# Patient Record
Sex: Female | Born: 1965
Health system: Southern US, Community
[De-identification: ages and names within clinical notes are randomized; demographics above are authoritative.]

## PROBLEM LIST (undated history)

## (undated) DIAGNOSIS — K219 Gastro-esophageal reflux disease without esophagitis: Secondary | ICD-10-CM

## (undated) DIAGNOSIS — Z9889 Other specified postprocedural states: Secondary | ICD-10-CM

## (undated) DIAGNOSIS — M797 Fibromyalgia: Secondary | ICD-10-CM

## (undated) DIAGNOSIS — F329 Major depressive disorder, single episode, unspecified: Secondary | ICD-10-CM

## (undated) DIAGNOSIS — G459 Transient cerebral ischemic attack, unspecified: Secondary | ICD-10-CM

## (undated) DIAGNOSIS — R569 Unspecified convulsions: Secondary | ICD-10-CM

## (undated) DIAGNOSIS — R112 Nausea with vomiting, unspecified: Secondary | ICD-10-CM

## (undated) DIAGNOSIS — F32A Depression, unspecified: Secondary | ICD-10-CM

## (undated) HISTORY — PX: OVARIAN CYST SURGERY: SHX726

## (undated) HISTORY — DX: Fibromyalgia: M79.7

## (undated) HISTORY — DX: Unspecified convulsions: R56.9

## (undated) HISTORY — PX: COSMETIC SURGERY: SHX468

## (undated) HISTORY — PX: TUBAL LIGATION: SHX77

## (undated) HISTORY — PX: ABDOMINAL HYSTERECTOMY: SHX81

## (undated) HISTORY — DX: Major depressive disorder, single episode, unspecified: F32.9

## (undated) HISTORY — DX: Depression, unspecified: F32.A

## (undated) HISTORY — DX: Gastro-esophageal reflux disease without esophagitis: K21.9

## (undated) HISTORY — PX: AUGMENTATION MAMMAPLASTY: SUR837

## (undated) HISTORY — PX: CHOLECYSTECTOMY: SHX55

## (undated) HISTORY — PX: TOTAL VAGINAL HYSTERECTOMY: SHX2548

---

## 1998-11-22 ENCOUNTER — Encounter: Admission: RE | Admit: 1998-11-22 | Discharge: 1998-11-22 | Payer: Self-pay | Admitting: Obstetrics

## 1999-02-14 ENCOUNTER — Encounter: Admission: RE | Admit: 1999-02-14 | Discharge: 1999-02-14 | Payer: Self-pay | Admitting: Obstetrics

## 2000-01-03 ENCOUNTER — Encounter: Admission: RE | Admit: 2000-01-03 | Discharge: 2000-01-03 | Payer: Self-pay | Admitting: Obstetrics

## 2000-06-02 ENCOUNTER — Encounter (INDEPENDENT_AMBULATORY_CARE_PROVIDER_SITE_OTHER): Payer: Self-pay

## 2000-06-02 ENCOUNTER — Ambulatory Visit (HOSPITAL_COMMUNITY): Admission: RE | Admit: 2000-06-02 | Discharge: 2000-06-02 | Payer: Self-pay | Admitting: Urology

## 2001-10-09 ENCOUNTER — Emergency Department (HOSPITAL_COMMUNITY): Admission: EM | Admit: 2001-10-09 | Discharge: 2001-10-09 | Payer: Self-pay | Admitting: *Deleted

## 2002-04-18 ENCOUNTER — Encounter (INDEPENDENT_AMBULATORY_CARE_PROVIDER_SITE_OTHER): Payer: Self-pay

## 2002-04-18 ENCOUNTER — Ambulatory Visit (HOSPITAL_COMMUNITY): Admission: RE | Admit: 2002-04-18 | Discharge: 2002-04-18 | Payer: Self-pay | Admitting: Gastroenterology

## 2003-05-15 ENCOUNTER — Ambulatory Visit (HOSPITAL_COMMUNITY): Admission: RE | Admit: 2003-05-15 | Discharge: 2003-05-15 | Payer: Self-pay | Admitting: Obstetrics

## 2003-05-15 ENCOUNTER — Encounter: Payer: Self-pay | Admitting: Obstetrics

## 2003-05-22 ENCOUNTER — Encounter: Admission: RE | Admit: 2003-05-22 | Discharge: 2003-05-22 | Payer: Self-pay | Admitting: Obstetrics

## 2003-05-22 ENCOUNTER — Encounter: Payer: Self-pay | Admitting: Obstetrics

## 2003-05-22 ENCOUNTER — Encounter (INDEPENDENT_AMBULATORY_CARE_PROVIDER_SITE_OTHER): Payer: Self-pay | Admitting: *Deleted

## 2003-05-26 ENCOUNTER — Encounter (HOSPITAL_BASED_OUTPATIENT_CLINIC_OR_DEPARTMENT_OTHER): Payer: Self-pay | Admitting: General Surgery

## 2003-05-31 ENCOUNTER — Encounter (HOSPITAL_BASED_OUTPATIENT_CLINIC_OR_DEPARTMENT_OTHER): Payer: Self-pay | Admitting: General Surgery

## 2003-05-31 ENCOUNTER — Encounter (INDEPENDENT_AMBULATORY_CARE_PROVIDER_SITE_OTHER): Payer: Self-pay | Admitting: Specialist

## 2003-05-31 ENCOUNTER — Ambulatory Visit (HOSPITAL_COMMUNITY): Admission: RE | Admit: 2003-05-31 | Discharge: 2003-05-31 | Payer: Self-pay | Admitting: General Surgery

## 2003-05-31 ENCOUNTER — Encounter: Admission: RE | Admit: 2003-05-31 | Discharge: 2003-05-31 | Payer: Self-pay | Admitting: General Surgery

## 2003-12-12 ENCOUNTER — Ambulatory Visit (HOSPITAL_COMMUNITY): Admission: RE | Admit: 2003-12-12 | Discharge: 2003-12-12 | Payer: Self-pay | Admitting: Obstetrics

## 2003-12-13 ENCOUNTER — Observation Stay (HOSPITAL_COMMUNITY): Admission: RE | Admit: 2003-12-13 | Discharge: 2003-12-14 | Payer: Self-pay | Admitting: Obstetrics

## 2003-12-13 ENCOUNTER — Encounter (INDEPENDENT_AMBULATORY_CARE_PROVIDER_SITE_OTHER): Payer: Self-pay | Admitting: *Deleted

## 2004-03-12 ENCOUNTER — Inpatient Hospital Stay (HOSPITAL_COMMUNITY): Admission: AD | Admit: 2004-03-12 | Discharge: 2004-03-12 | Payer: Self-pay | Admitting: Obstetrics

## 2004-06-03 ENCOUNTER — Ambulatory Visit (HOSPITAL_COMMUNITY): Admission: RE | Admit: 2004-06-03 | Discharge: 2004-06-03 | Payer: Self-pay | Admitting: Obstetrics

## 2005-08-20 ENCOUNTER — Encounter: Admission: RE | Admit: 2005-08-20 | Discharge: 2005-08-20 | Payer: Self-pay | Admitting: Obstetrics

## 2005-10-30 ENCOUNTER — Ambulatory Visit: Payer: Self-pay | Admitting: Internal Medicine

## 2006-02-04 ENCOUNTER — Ambulatory Visit: Payer: Self-pay | Admitting: Family Medicine

## 2006-04-02 ENCOUNTER — Ambulatory Visit: Payer: Self-pay | Admitting: Internal Medicine

## 2006-04-08 ENCOUNTER — Ambulatory Visit: Payer: Self-pay | Admitting: Family Medicine

## 2006-04-09 ENCOUNTER — Encounter: Admission: RE | Admit: 2006-04-09 | Discharge: 2006-04-09 | Payer: Self-pay | Admitting: Family Medicine

## 2006-10-23 ENCOUNTER — Ambulatory Visit: Payer: Self-pay | Admitting: Internal Medicine

## 2007-11-16 ENCOUNTER — Telehealth (INDEPENDENT_AMBULATORY_CARE_PROVIDER_SITE_OTHER): Payer: Self-pay | Admitting: *Deleted

## 2007-12-02 ENCOUNTER — Telehealth (INDEPENDENT_AMBULATORY_CARE_PROVIDER_SITE_OTHER): Payer: Self-pay | Admitting: *Deleted

## 2007-12-31 ENCOUNTER — Encounter: Admission: RE | Admit: 2007-12-31 | Discharge: 2007-12-31 | Payer: Self-pay | Admitting: Obstetrics

## 2009-05-02 ENCOUNTER — Ambulatory Visit: Payer: Self-pay | Admitting: Internal Medicine

## 2009-05-02 DIAGNOSIS — J019 Acute sinusitis, unspecified: Secondary | ICD-10-CM | POA: Insufficient documentation

## 2009-05-23 ENCOUNTER — Ambulatory Visit: Payer: Self-pay | Admitting: Family Medicine

## 2009-06-01 ENCOUNTER — Telehealth (INDEPENDENT_AMBULATORY_CARE_PROVIDER_SITE_OTHER): Payer: Self-pay | Admitting: *Deleted

## 2009-06-05 ENCOUNTER — Telehealth (INDEPENDENT_AMBULATORY_CARE_PROVIDER_SITE_OTHER): Payer: Self-pay | Admitting: *Deleted

## 2009-06-15 ENCOUNTER — Ambulatory Visit (HOSPITAL_COMMUNITY): Admission: RE | Admit: 2009-06-15 | Discharge: 2009-06-15 | Payer: Self-pay | Admitting: Obstetrics

## 2009-08-13 LAB — HM MAMMOGRAPHY: HM Mammogram: NORMAL

## 2009-09-07 ENCOUNTER — Encounter: Admission: RE | Admit: 2009-09-07 | Discharge: 2009-09-07 | Payer: Self-pay | Admitting: Obstetrics

## 2009-10-19 ENCOUNTER — Ambulatory Visit: Payer: Self-pay | Admitting: Family

## 2009-10-19 DIAGNOSIS — M2550 Pain in unspecified joint: Secondary | ICD-10-CM | POA: Insufficient documentation

## 2009-10-22 ENCOUNTER — Telehealth: Payer: Self-pay | Admitting: Family

## 2009-10-25 LAB — CONVERTED CEMR LAB
ALT: 13 units/L (ref 0–35)
AST: 13 units/L (ref 0–37)
Albumin: 4.5 g/dL (ref 3.5–5.2)
Alkaline Phosphatase: 46 units/L (ref 39–117)
Anti Nuclear Antibody(ANA): NEGATIVE
BUN: 21 mg/dL (ref 6–23)
Basophils Absolute: 0 10*3/uL (ref 0.0–0.1)
Basophils Relative: 1 % (ref 0–1)
CO2: 19 meq/L (ref 19–32)
Calcium: 8.9 mg/dL (ref 8.4–10.5)
Chloride: 106 meq/L (ref 96–112)
Creatinine, Ser: 0.7 mg/dL (ref 0.40–1.20)
Eosinophils Absolute: 0.1 10*3/uL (ref 0.0–0.7)
Eosinophils Relative: 1 % (ref 0–5)
Glucose, Bld: 97 mg/dL (ref 70–99)
HCT: 35.6 % — ABNORMAL LOW (ref 36.0–46.0)
Hemoglobin: 12.3 g/dL (ref 12.0–15.0)
Lymphocytes Relative: 41 % (ref 12–46)
Lymphs Abs: 3.1 10*3/uL (ref 0.7–4.0)
MCHC: 34.6 g/dL (ref 30.0–36.0)
MCV: 88.8 fL (ref 78.0–100.0)
Monocytes Absolute: 0.5 10*3/uL (ref 0.1–1.0)
Monocytes Relative: 7 % (ref 3–12)
Neutro Abs: 3.9 10*3/uL (ref 1.7–7.7)
Neutrophils Relative %: 51 % (ref 43–77)
Platelets: 286 10*3/uL (ref 150–400)
Potassium: 3.6 meq/L (ref 3.5–5.3)
RBC: 4.01 M/uL (ref 3.87–5.11)
RDW: 12.6 % (ref 11.5–15.5)
Rheumatoid fact SerPl-aCnc: <20 intl units/mL (ref 0–20)
Sed Rate: 8 mm/hr (ref 0–22)
Sodium: 141 meq/L (ref 135–145)
Total Bilirubin: 0.2 mg/dL — ABNORMAL LOW (ref 0.3–1.2)
Total Protein: 7 g/dL (ref 6.0–8.3)
WBC: 7.7 10*3/uL (ref 4.0–10.5)

## 2009-10-26 ENCOUNTER — Ambulatory Visit: Payer: Self-pay | Admitting: Family

## 2009-10-26 DIAGNOSIS — N390 Urinary tract infection, site not specified: Secondary | ICD-10-CM | POA: Insufficient documentation

## 2009-10-26 DIAGNOSIS — M797 Fibromyalgia: Secondary | ICD-10-CM | POA: Insufficient documentation

## 2009-10-26 LAB — CONVERTED CEMR LAB
Bilirubin Urine: NEGATIVE
Blood in Urine, dipstick: NEGATIVE
Glucose, Urine, Semiquant: NEGATIVE
HCV Ab: NEGATIVE
Hep A IgM: NEGATIVE
Hep B C IgM: NEGATIVE
Hepatitis B Surface Ag: NEGATIVE
Ketones, urine, test strip: NEGATIVE
Nitrite: POSITIVE
Protein, U semiquant: NEGATIVE
Specific Gravity, Urine: 1.01
Uric Acid, Serum: 3.4 mg/dL (ref 2.4–7.0)
Urobilinogen, UA: 0.2
WBC Urine, dipstick: NEGATIVE
pH: 5

## 2009-10-30 ENCOUNTER — Encounter: Payer: Self-pay | Admitting: Family

## 2009-11-05 ENCOUNTER — Encounter: Payer: Self-pay | Admitting: Family

## 2009-12-04 ENCOUNTER — Telehealth: Payer: Self-pay | Admitting: Family

## 2009-12-07 ENCOUNTER — Encounter: Payer: Self-pay | Admitting: Family

## 2009-12-07 ENCOUNTER — Ambulatory Visit: Payer: Self-pay | Admitting: Family

## 2009-12-07 DIAGNOSIS — J301 Allergic rhinitis due to pollen: Secondary | ICD-10-CM | POA: Insufficient documentation

## 2009-12-26 ENCOUNTER — Ambulatory Visit: Payer: Self-pay | Admitting: Family

## 2009-12-26 DIAGNOSIS — J029 Acute pharyngitis, unspecified: Secondary | ICD-10-CM | POA: Insufficient documentation

## 2010-01-24 ENCOUNTER — Telehealth: Payer: Self-pay | Admitting: Family

## 2010-02-04 ENCOUNTER — Telehealth: Payer: Self-pay | Admitting: Family

## 2010-03-27 ENCOUNTER — Ambulatory Visit: Payer: Self-pay | Admitting: Family

## 2010-03-27 DIAGNOSIS — Z9189 Other specified personal risk factors, not elsewhere classified: Secondary | ICD-10-CM | POA: Insufficient documentation

## 2010-03-27 LAB — CONVERTED CEMR LAB

## 2010-03-29 ENCOUNTER — Telehealth: Payer: Self-pay | Admitting: Family

## 2010-05-27 ENCOUNTER — Encounter (INDEPENDENT_AMBULATORY_CARE_PROVIDER_SITE_OTHER): Payer: Self-pay | Admitting: *Deleted

## 2010-05-28 ENCOUNTER — Telehealth: Payer: Self-pay | Admitting: Family

## 2010-08-14 ENCOUNTER — Telehealth: Payer: Self-pay | Admitting: Family

## 2010-08-31 ENCOUNTER — Encounter: Payer: Self-pay | Admitting: Obstetrics

## 2010-09-10 NOTE — Progress Notes (Signed)
Summary: Savella pt assistance application  Phone Note Outgoing Call   Summary of Call: Please call patient and let her know that I need her signature in 1 more place before I can complete the [facility] patient assist form. Could she pls come to the office to sign? Paperwork is in folder of forms to be picked up. Thanks Initial call taken by: Lemont Fillers FNP,  March 29, 2010 5:04 PM  Follow-up for Phone Call        Left message with pt's husband to have pt return my call. Nicki Guadalajara Fergerson CMA Duncan Dull)  April 01, 2010 9:43 AM   Pt not home, left message on pt's cell [phone]) to return my call.  Nicki Guadalajara Fergerson CMA Duncan Dull)  April 02, 2010 3:12 PM   Additional Follow-up for Phone Call Additional follow up Details #1::        Pt returned my call and states she can come by on Friday to sign the form. Nicki Guadalajara Fergerson CMA Duncan Dull)  April 02, 2010 3:58 PM   Pt came to office and signed papers.  Forms, rx and provider business card mailed to [phone].  [place], [place]  [address].  Nicki Guadalajara Fergerson CMA Duncan Dull)  April 05, 2010 3:43 PM     Prescriptions: SAVELLA 50 MG TABS (MILNACIPRAN HCL) one tablet by mouth two times a day  #180 x 0   Entered by:   Mervin Kung CMA (AAMA)   Authorized by:   Lemont Fillers FNP   Signed by:   Mervin Kung CMA (AAMA) on 04/05/2010   Method used:   Printed then mailed to ...       [facility] [address]* (retail)       [address]       [place], [place]  [address]       Ph: [phone]       Fax: [phone]   RxID:   [id number]

## 2010-09-10 NOTE — Letter (Signed)
Summary: Out of Work  Adult nurse at [employer]. [address]   [place], [place] [address]   Phone: [phone]  Fax: [phone]    Dec 26, 2009   Employee:  Martha Moss    To Whom It May Concern:   For Medical reasons, please excuse the above named employee from work for the following dates:  Start:   12/26/2009 End:   Pt. may return to work on 12/31/2009.  If you need additional information, please feel free to contact our office.       Sincerely,    Mervin Kung CMA Sandford Craze, NP

## 2010-09-10 NOTE — Medication Information (Signed)
Summary: Patient Assistance Form/[employer]  Patient Assistance Form/[employer]   Imported By: Lanelle Bal 04/12/2010 10:48:39  _____________________________________________________________________  External Attachment:    Type:   Image     Comment:   External Document

## 2010-09-10 NOTE — Progress Notes (Signed)
Summary: needs f/u  Phone Note Outgoing Call   Call placed by: Mervin Kung, CMA Call placed to: Patient Summary of Call: Left message at work number for pt. to return call.  Received refill request for Savella.  Needs to reschedule f/u appt.  Mervin Kung CMA  December 04, 2009 10:39 AM   Follow-up for Phone Call        Spoke to pt and advised her refill was being sent to pharmacy for [facility]. Pt states it has been helping with everything except her hands. F/u appt scheduled for 12/07/09 at 10:45 with Sandford Craze, NP.  Mervin Kung CMA  December 04, 2009 12:12 PM

## 2010-09-10 NOTE — Progress Notes (Signed)
Summary: Ambien   Phone Note Call from Patient Call back at [phone]   Caller: Patient Call For: Lemont Fillers FNP Summary of Call: patient called stating she lost her insurance and would like to know if she could switch from the Ambien Cr to regular Ambien. If approved she would like the rx sent to [facility] in [place] on [address]. Initial call taken by: Glendell Docker CMA,  January 24, 2010 3:15 PM  Follow-up for Phone Call        OK to call in 30 tabs of ambien 10 mg by mouth hs as needed insomnia with zero refills. Follow-up by: Lemont Fillers FNP,  January 24, 2010 5:20 PM  Additional Follow-up for Phone Call Additional follow up Details #1::        Rx called to pharmacy per Efraim [facility]aufmann [place]. call  placed to patient at [phone], no answer, voice message left informing patient rx telephoned to pharmacy Additional Follow-up by: Glendell Docker CMA,  January 25, 2010 8:13 AM    New/Updated Medications: AMBIEN 10 MG TABS (ZOLPIDEM TARTRATE) 1/2 t tablet by mouth as needed insomnia Prescriptions: AMBIEN 10 MG TABS (ZOLPIDEM TARTRATE) 1/2 t tablet by mouth as needed insomnia  #30 x 0   Entered by:   Glendell Docker CMA   Authorized by:   Lemont Fillers FNP   Signed by:   Glendell Docker CMA on 01/25/2010   Method used:   Telephoned to ...       [facility] [address]* (retail)       [address]       [place], [place]  [address]       Ph: [phone]       Fax: [phone]   RxID:   [id number]

## 2010-09-10 NOTE — Progress Notes (Signed)
Summary: pt. wants lab results  Phone Note Call from Patient   Caller: Patient Summary of Call: Pt. wants to know her lab results. Call pt at [phone] Initial call taken by: Michaelle Copas,  October 22, 2009 1:32 PM  Follow-up for Phone Call        Notified pt. of normal labs. ANA is pending. Advised pt. we would call her when this result comes back. Follow-up by: Mervin Kung CMA,  October 22, 2009 1:56 PM

## 2010-09-10 NOTE — Progress Notes (Signed)
Summary: Martha Moss Ready for Pick up  Phone Note Outgoing Call   Call placed by: Glendell Docker CMA,  May 28, 2010 8:05 AM Call placed to: Patient Summary of Call: call placed to patient at [phone], female individual answered stating patient was not available. Message left to have patient return call.  Patient assistance medication Savella arrived in office and will be left at front desk for patient pick up Initial call taken by: Glendell Docker CMA,  May 28, 2010 8:09 AM  Follow-up for Phone Call        call placed to patient at [phone], no asnwer, no voice message left. Follow-up by: Glendell Docker CMA,  May 29, 2010 12:13 PM  Additional Follow-up for Phone Call Additional follow up Details #1::        call placed to patient at [phone], female stated patient no longer worked there, call placed to [phone], no answer. A voice message was left for patient to return call Additional Follow-up by: Glendell Docker CMA,  May 30, 2010 10:36 AM    Additional Follow-up for Phone Call Additional follow up Details #2::    Left detailed message on cell phone that Martha Moss has arrived at the office and is at the front desk for pick up. Left detailed msg per HIPPA auth. Nicki Guadalajara Fergerson CMA Duncan Dull)  May 31, 2010 11:53 AM

## 2010-09-10 NOTE — Consult Note (Signed)
Summary: [facility]   Imported By: Lanelle Bal 11/19/2009 13:02:59  _____________________________________________________________________  External Attachment:    Type:   Image     Comment:   External Document

## 2010-09-10 NOTE — Progress Notes (Signed)
Summary: Change to Ambien 10mg   Phone Note Call from Patient Call back at [phone]   Caller: Patient Summary of Call: patient called and left voice message stating the Ambien is not working that well and she would like to know if the dosage could be changed. Initial call taken by: Glendell Docker CMA,  February 04, 2010 3:47 PM  Follow-up for Phone Call        That is the max  dose of ambien.  She can try benadryl 25mg  at bedtime and see if this helps.   Follow-up by: Lemont Fillers FNP,  February 04, 2010 4:42 PM  Additional Follow-up for Phone Call Additional follow up Details #1::        Is this in addition to the 1/2 tablet of Ambien? Mervin Kung CMA  February 04, 2010 4:48 PM   Joeann Steppe, my error. Pt is taking 1 tablet at bedtime but she is allergic to Benadryl. Pt states she has lost her insurance and would like to get rx for Ambien 10mg  instead of the CR due to cost.  She would also like to get #30 at a time.  Please advise.  Mervin Kung CMA  February 05, 2010 1:42 PM     Additional Follow-up for Phone Call Additional follow up Details #2::    Cancel the benadryl. Follow-up by: Lemont Fillers FNP,  February 04, 2010 4:58 PM  Additional Follow-up for Phone Call Additional follow up Details #3:: Details for Additional Follow-up Action Taken: This was called in 6/16- see Darlene's phone note please. Lemont Fillers FNP,  February 05, 2010 3:27 PM  Called pt. re: 6/16 rx. Pt states script picked up at pharmacy was for 5mg  1 tablet at bedtime #15.  Spoke to [place] [facility] in [place] and verified they filled script for 5mg  instead of 10mg . Not sure where the discrepancy came in.  Gave new rx. to [facility] for Ambien 10mg  1 at bedtime as needed #30 x no refills.  Pt notified of above.  Nicki Guadalajara Fergerson CMA  February 06, 2010 10:00 AM   New/Updated Medications: AMBIEN 10 MG TABS (ZOLPIDEM TARTRATE) 1 tablet by mouth as needed for  insomnia

## 2010-09-10 NOTE — Assessment & Plan Note (Signed)
Summary: FOLLOW UP -- PAIN NOT BETTER / TF,CMA   Vital Signs:  Patient profile:   45 year old female Weight:      124.50 pounds BMI:     23.23 Temp:     99.1 degrees F oral Pulse rate:   72 / minute Pulse rhythm:   regular Resp:     16 per minute BP sitting:   98 / 60  (right arm) Cuff size:   regular  Vitals Entered By: Mervin Kung CMA (October 26, 2009 3:41 PM) CC: room 4  follow up, pain not better.  Urinary frequency and burning since yesterday.   CC:  room 4  follow up and pain not better.  Urinary frequency and burning since yesterday.Marland Kitchen  History of Present Illness: Martha Moss is a 45 year old female who presents today for follow up of her joint pain.  Notes that she has had polyarticular joint pain for several years, but last few months this has become worse.  So bad that she has stopped doing hair, due to hand pain. Notes + fatigue.  Weight has been stable, notes that her hands and ankles are swollen in the mornings.   Notes + dysuria. Denies significant depression, notes that she is tearful because she is frustrated with her joint pain.  She tells me that she is afraid that she sounds "crazy" due to her multiple complaints.  Husband told patient prior to coming into exam room that she does not have the "spunk" that she used to have- this comment bothered patient significantly.    Allergies: 1)  ! Pcn 2)  ! * Latex 3)  ! Zithromax 4)  ! Erythromycin 5)  ! Benadryl 6)  ! Sulfa  Physical Exam  General:  thin white female tearful during much of interview and exam Head:  Normocephalic and atraumatic without obvious abnormalities. No apparent alopecia or balding. Msk:  + tenderness noted to palpation at 18 trigger point locations Extremities:  No clubbing, cyanosis, edema, or deformity noted with normal full range of motion of all joints.     Impression & Recommendations:  Problem # 1:  POLYARTHRITIS (ICD-719.49) I suspect that patient also has fibromyalgia,  however this does not explain her polyarticular joint pain.  Will check below labs and plan referral to rheumatology Orders: T-Lyme Disease [id number]) T-Hepatitis Acute Panel ([id number]) T-Uric Acid (Blood) [phone]) Rheumatology Referral (Rheumatology)  Problem # 2:  UTI (ICD-599.0) Assessment: New  Plan to treat with cipro Her updated medication list for this problem includes:    Cipro 250 Mg Tabs (Ciprofloxacin hcl) ..... One tablet by mouth two times a day x 7 days  Problem # 3:  FIBROMYALGIA (ICD-729.1) Assessment: New Will give trial of savella.  Plan follow up in 1 month.  Pt was instructed to D/C med and call if worsening depression while taking this medication.   Complete Medication List: 1)  Topamax 200 Mg Tabs (Topiramate) .... Take 1 tablet by mouth once a day 2)  Celexa 20 Mg Tabs (Citalopram hydrobromide) .... Take 1 tablet by mouth once a day 3)  Ambien Cr 12.5 Mg Cr-tabs (Zolpidem tartrate) .... Take 1 tab by mouth at bedtime 4)  Azo-standard 95 Mg Tabs (Phenazopyridine hcl) .... As needed 5)  Cipro 250 Mg Tabs (Ciprofloxacin hcl) .... One tablet by mouth two times a day x 7 days 6)  Savella Titration Pack 12.5 & 25 & 50 Mg Misc (Milnacipran hcl) .... Take as directed  Other Orders:  UA Dipstick w/o Micro (manual) ([address])  Patient Instructions: 1)  You will be called about your referral to Rheumatology. 2)  Please try savella and see if it helps your muscle pain. 3)  Savella dosing - 12.5 mg on day 1, then 12.5 mg twice daily x 2 days, then 25 mg twice daily x4 days, then 50 mg twice daily 4)  Please follow up in 1 month. Prescriptions: SAVELLA TITRATION PACK 12.5 & 25 & 50 MG MISC (MILNACIPRAN HCL) take as directed  #1 pack x 0   Entered and Authorized by:   Lemont Fillers FNP   Signed by:   Lemont Fillers FNP on 10/26/2009   Method used:   Electronically to        [facility] (retail)       [address]        [place], [place]  [address]       Ph: [phone]       Fax: [phone]   RxID:   [id number] CIPRO 250 MG TABS (CIPROFLOXACIN HCL) one tablet by mouth two times a day x 7 days  #14 x 0   Entered and Authorized by:   Lemont Fillers FNP   Signed by:   Lemont Fillers FNP on 10/26/2009   Method used:   Electronically to        [facility].* (retail)       [address]       [place], [place]  [address]       Ph: [phone]       Fax: [phone]   RxID:   [id number]   Current Allergies (reviewed today): ! PCN ! * LATEX ! ZITHROMAX ! ERYTHROMYCIN ! BENADRYL ! SULFA  Laboratory Results   Urine Tests    Routine Urinalysis   Color: yellow Appearance: Clear Glucose: negative   (Normal Range: Negative) Bilirubin: negative   (Normal Range: Negative) Ketone: negative   (Normal Range: Negative) Spec. Gravity: 1.010   (Normal Range: 1.003-1.035) Blood: negative   (Normal Range: Negative) pH: 5.0   (Normal Range: 5.0-8.0) Protein: negative   (Normal Range: Negative) Urobilinogen: 0.2   (Normal Range: 0-1) Nitrite: positive   (Normal Range: Negative) Leukocyte Esterace: negative   (Normal Range: Negative)       Appended Document: FOLLOW UP -- PAIN NOT BETTER / TF,CMA ANA, RA, ESR were all WNL last visit.

## 2010-09-10 NOTE — Assessment & Plan Note (Signed)
Summary: 3 month follow up / tf,cma   Vital Signs:  Patient profile:   45 year old female Weight:      125.25 pounds BMI:     22.99 O2 Sat:      100 % on Room air Temp:     97.9 degrees F oral Pulse rate:   94 / minute Pulse rhythm:   regular Resp:     16 per minute BP sitting:   108 / 70  (right arm) Cuff size:   regular  Vitals Entered By: Glendell Docker CMA (March 27, 2010 2:32 PM)  O2 Flow:  Room air CC: 3 Month Follow up  Is Patient Diabetic? No Pain Assessment Patient in pain? no      Comments no concerns, refill on Celexa , Ambien, and Savella     Last PAP Result Hysterectomy   CC:  3 Month Follow up .  History of Present Illness: Martha Moss is a 45 year old female who presents today for follow up of her fibromyalgia. Notes that she has had a lot stress lately.  Both she and her husband are out of work. Muscle pain is better,  but still having some joint pain.  Notes that she is sleeping well.  Notes that she occasionally has chest pain.  Not made worse by exercise/exertion.  She feels like this pain is due to heartburn.  Has been trying OTC omeprazole without improvement.  Sometimes she notes anterior chest wall tenderness.  Denies current chest pain.    Allergies: 1)  ! Penicillin 2)  ! Benadryl 3)  ! Sulfa 4)  ! Pcn 5)  ! * Latex 6)  ! Zithromax 7)  ! Erythromycin 8)  ! Benadryl 9)  ! Sulfa  Physical Exam  General:  Well-developed,well-nourished,in no acute distress; alert,appropriate and cooperative throughout examination Lungs:  Normal respiratory effort, chest expands symmetrically. Lungs are clear to auscultation, no crackles or wheezes. Heart:  Normal rate and regular rhythm. S1 and S2 normal without gallop, murmur, click, rub or other extra sounds. Msk:  Mild left anterior chest tenderness to palpation.  normal ROM and no joint swelling.     Contraindications/Deferment of Procedures/Staging:    Test/Procedure: PAP Smear    Reason for  deferment: hysterectomy   Impression & Recommendations:  Problem # 1:  FIBROMYALGIA (ICD-729.1) Assessment Comment Only Muscle pain is stable, however she notes increased joint pain.  Stress may be a factor.  Her autoimmune work-up was negative.  Plan to continue Savella.  Samples of 25 mg tablets were given to patient with instructions for her to take 2 tabs with each dose.    Problem # 2:  CHEST PAIN, ATYPICAL, HX OF (ICD-V15.89) Assessment: Comment Only Patient has had this in the past.  I recommended EKG- however patient refused due to cost.  I also recommended referral for stress test which she also declined.  Clinically, I doubt cardiac etiology, will increase her omeprazole.  Recommended that the patient go to ED if she develops chest pain that does not resolve, shortness of breath,  L arm or jaw pain, associated nausea.  She verbalized understanding.  Complete Medication List: 1)  Topamax 200 Mg Tabs (Topiramate) .Marland Kitchen.. 1 by mouth once daily 2)  Celexa 20 Mg Tabs (Citalopram hydrobromide) .... Take 1 tablet by mouth once a day 3)  Ambien 10 Mg Tabs (Zolpidem tartrate) .Marland Kitchen.. 1 tablet by mouth as needed for  insomnia 4)  Savella 50 Mg Tabs (Milnacipran hcl) .Marland KitchenMarland KitchenMarland Kitchen  One tablet by mouth two times a day 5)  Fluticasone Propionate 50 Mcg/act Susp (Fluticasone propionate) .... 2 sprays each nostril daily 6)  Omeprazole 20 Mg Cpdr (Omeprazole) .... Two tablet by mouth daily  Patient Instructions: 1)  Go to the ER if you develop worsening chest pain. 2)  Follow up in 2 months, sooner if symptoms worsen or do not improve. Prescriptions: AMBIEN 10 MG TABS (ZOLPIDEM TARTRATE) 1 tablet by mouth as needed for  insomnia  #30 x 0   Entered and Authorized by:   Lemont Fillers FNP   Signed by:   Lemont Fillers FNP on 03/27/2010   Method used:   Print then Give to Patient   RxID:   [id number] CELEXA 20 MG TABS (CITALOPRAM HYDROBROMIDE) Take 1 tablet by mouth once a day  #30 x 5    Entered and Authorized by:   Lemont Fillers FNP   Signed by:   Lemont Fillers FNP on 03/27/2010   Method used:   Electronically to        [facility] (retail)       [address]       [place], [place]  [address]       Ph: [phone]       Fax: [phone]   RxID:   [id number]    Preventive Care Screening  Pap Smear:    Date:  03/27/2010    Results:  Hysterectomy

## 2010-09-10 NOTE — Assessment & Plan Note (Signed)
Summary: SORE THROAT POSS STREP?/DT   Vital Signs:  Patient profile:   45 year old female Height:      62 inches Weight:      127.50 pounds BMI:     23.40 Temp:     98.2 degrees F oral Pulse rate:   90 / minute Pulse rhythm:   regular Resp:     16 per minute BP sitting:   118 / 78  (right arm) Cuff size:   regular  Vitals Entered By: Mervin Kung CMA (Dec 26, 2009 11:45 AM) CC: room 4  Sore throat, headache and productive cough  x 4 days. Is Patient Diabetic? No   CC:  room 4  Sore throat and headache and productive cough  x 4 days.Marland Kitchen  History of Present Illness: Martha Moss is a 45 year old female who presents today with complain of sore throat which started 3-4 days ago.  This has been associated with cough, mainly non-productive in nature.  Denies known fever.  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  Mild ear pressure.  Denies nasal congestion.   Has tried tylenol and motrin without relief.  Allergies: 1)  ! Penicillin 2)  ! Benadryl 3)  ! Sulfa 4)  ! Pcn 5)  ! * Latex 6)  ! Zithromax 7)  ! Erythromycin 8)  ! Benadryl 9)  ! Sulfa  Physical Exam  General:  Well-developed,well-nourished,in no acute distress; alert,appropriate and cooperative throughout examination Head:  Normocephalic and atraumatic without obvious abnormalities. No apparent alopecia or balding. Ears:  External ear exam shows no significant lesions or deformities.  Otoscopic examination reveals clear canals, tympanic membranes are intact bilaterally without bulging, retraction, inflammation or discharge. Hearing is grossly normal bilaterally. Mouth:  minimal pharyngeal erythema without exudates. Neck:  mild cervical LAD Lungs:  Normal respiratory effort, chest expands symmetrically. Lungs are clear to auscultation, no crackles or wheezes. Heart:  Normal rate and regular rhythm. S1 and S2 normal without gallop, murmur, click, rub or other extra sounds.   Impression & Recommendations:  Problem # 1:   PHARYNGITIS (ICD-462) Assessment New Rapid strep is negative.  Suspect viral etiology.  Will add tessalon as needed cough, supportive measures as listed on patient instructions.   The following medications were removed from the medication list:    Cipro 250 Mg Tabs (Ciprofloxacin hcl) ..... One tablet by mouth two times a day x 7 days  Orders: Rapid Strep ([address])  Complete Medication List: 1)  Topamax 200 Mg Tabs (Topiramate) .Marland Kitchen.. 1 by mouth once daily 2)  Celexa 20 Mg Tabs (Citalopram hydrobromide) .... Take 1 tablet by mouth once a day 3)  Ambien Cr 12.5 Mg Cr-tabs (Zolpidem tartrate) .... Take 1 tab by mouth at bedtime 4)  Azo-standard 95 Mg Tabs (Phenazopyridine hcl) .... As needed 5)  Ambien Cr 12.5 Mg Cr-tabs (Zolpidem tartrate) .... Take 1 tab by mouth at bedtime 6)  Savella 50 Mg Tabs (Milnacipran hcl) .... One tablet by mouth two times a day 7)  Fluticasone Propionate 50 Mcg/act Susp (Fluticasone propionate) .... 2 sprays each nostril daily 8)  Tessalon Perles 100 Mg Caps (Benzonatate) .... One cap by mouth three times a day as needed cough  Patient Instructions: 1)  Gargle twice daily with salt water. 2)  Take Tylenol 650mg  every 6 hours as needed for pain 3)  You may use over the counter Cepacol lozenges or Chloraseptic spray as needed for sore throat. 4)  Call if symptoms worsen or  if they are not resolved in 1 week. Prescriptions: TESSALON PERLES 100 MG CAPS (BENZONATATE) one cap by mouth three times a day as needed cough  #30 x 0   Entered and Authorized by:   Lemont Fillers FNP   Signed by:   Lemont Fillers FNP on 12/26/2009   Method used:   Electronically to        [facility].* (retail)       [address]       [place], [place]  [address]       Ph: [phone]       Fax: [phone]   RxID:   [id number]   Current Allergies (reviewed today): ! PENICILLIN ! BENADRYL ! SULFA ! PCN ! * LATEX !  ZITHROMAX ! ERYTHROMYCIN ! BENADRYL ! SULFA

## 2010-09-10 NOTE — Assessment & Plan Note (Signed)
Summary: 1 MONTH FOLLOW UP / TF,CMA   Vital Signs:  Patient profile:   45 year old female Height:      62 inches Weight:      127 pounds BMI:     23.31 Temp:     98.8 degrees F oral Pulse rate:   72 / minute Pulse rhythm:   regular Resp:     12 per minute BP sitting:   126 / 72  (right arm) Cuff size:   regular  Vitals Entered By: Mervin Kung CMA (December 07, 2009 11:01 AM) CC: room 5  1 month follow up. Pt states she is doing good except pain in wrist and hands, worse on the right. Is Patient Diabetic? No   CC:  room 5  1 month follow up. Pt states she is doing good except pain in wrist and hands and worse on the right..  History of Present Illness: Ms.  Bolda is a 45 year old female who presents today for follow up of her fibromyalgia.  Last visit she was started on Savella.  She also had a consultation with Dr. Azzie Roup, Rheumatologist who agrees that patient's symptoms are consistent with Fibromyalgia syndroe. Notes significant improvement in muscle tendereness and joint pain.  The only area that really continues to bother her is her right hand. Since starting Savella she notes side effects of constipation for which she has started to increase her fiber intake.  She also notes slightly worsening insomnia.  She has been using Ambien with some improvement.  Overall patient reports that the side effects are not bothersome enought to maker her want to discontinue therapy.   C/o nasal drip from allergies, using claritin, throat is a little sore. (pls note patient has 2 medical records which we will request to be  merged by medical records)  Allergies: 1)  ! Penicillin 2)  ! Benadryl 3)  ! Sulfa  Physical Exam  General:  Well-developed,well-nourished,in no acute distress; alert,appropriate and cooperative throughout examination Head:  Normocephalic and atraumatic without obvious abnormalities. No apparent alopecia or balding. Ears:  External ear exam shows no  significant lesions or deformities.  Otoscopic examination reveals clear canals, tympanic membranes are intact bilaterally without bulging, retraction, inflammation or discharge. Hearing is grossly normal bilaterally. Mouth:  Oral mucosa and oropharynx without lesions or exudates.  Teeth in good repair. Lungs:  Normal respiratory effort, chest expands symmetrically. Lungs are clear to auscultation, no crackles or wheezes. Heart:  Normal rate and regular rhythm. S1 and S2 normal without gallop, murmur, click, rub or other extra sounds. Extremities:  No joint swelling or effusions noted.   Impression & Recommendations:  Problem # 1:  FIBROMYALGIA (ICD-729.1) Assessment Improved Clinically improved.  Plan to continue Savella and have patient follow up in 3 months. The following medications were removed from the medication list:    Ultram 50 Mg Tabs (Tramadol hcl) .Marland Kitchen... 1-2 by mouth every 6 hours as needed  Problem # 2:  ALLERGIC RHINITIS, SEASONAL (ICD-477.0) Assessment: Deteriorated Taking claritin.  Will add fluticasone (Veramist was too expensive for patient)  Complete Medication List: 1)  Topamax 200 Mg Tabs (Topiramate) .Marland Kitchen.. 1 by mouth once daily 2)  Celexa 20 Mg Tabs (Citalopram hydrobromide) .Marland Kitchen.. 1 by mouth once daily 3)  Ambien Cr 12.5 Mg Cr-tabs (Zolpidem tartrate) .... Take 1 tab by mouth at bedtime 4)  Savella 50 Mg Tabs (Milnacipran hcl) .... One tablet by mouth two times a day 5)  Fluticasone Propionate 50  Mcg/act Susp (Fluticasone propionate) .... 2 sprays each nostril daily  Patient Instructions: 1)  Please follow up in 3 months, sooner if symptoms worsen. Prescriptions: AMBIEN CR 12.5 MG CR-TABS (ZOLPIDEM TARTRATE) Take 1 tab by mouth at bedtime  #30 x 0   Entered and Authorized by:   Lemont Fillers FNP   Signed by:   Lemont Fillers FNP on 12/07/2009   Method used:   Telephoned to ...       [facility] [address]* (retail)       [address]        [place], [place]  [address]       Ph: [phone]       Fax: [phone]   RxID:   [id number] FLUTICASONE PROPIONATE 50 MCG/ACT SUSP (FLUTICASONE PROPIONATE) 2 sprays each nostril daily  #1 x 0   Entered and Authorized by:   Lemont Fillers FNP   Signed by:   Lemont Fillers FNP on 12/07/2009   Method used:   Electronically to        [facility] (retail)       [address]       [place], [place]  [address]       Ph: [phone]       Fax: [phone]   RxID:   [id number] SAVELLA 50 MG TABS (MILNACIPRAN HCL) one tablet by mouth two times a day  #60 x 3   Entered and Authorized by:   Lemont Fillers FNP   Signed by:   Lemont Fillers FNP on 12/07/2009   Method used:   Electronically to        [facility] (retail)       [address]       [place], [place]  [address]       Ph: [phone]       Fax: [phone]   RxID:   [id number]   Current Allergies (reviewed today): ! PENICILLIN ! BENADRYL ! SULFA

## 2010-09-10 NOTE — Letter (Signed)
Summary: [place] No Show Letter  [place] [facility]  [address]   [place], [place] [address]   Phone: [phone]  Fax: [phone]    05/27/2010 MRN: [id number]  Martha Moss [address] [place], [place]  [address]   Dear Martha Moss,   Our records indicate that you missed your scheduled appointment with Sandford Craze on 05-27-2010.  Please contact this office to reschedule your appointment as soon as possible.  It is important that you keep your scheduled appointments with your physician, so we can provide you the best care possible.  Please be advised that there may be a charge for "no show" appointments.    Sincerely,   [place] at [facility]

## 2010-09-10 NOTE — Letter (Signed)
   [name] [facility] [address] [place], [place]  [address] Phone: [phone]      October 30, 2009   Martha Moss [address] [place], [place] [address]  RE:  LAB RESULTS  Dear  [name],  The following is an interpretation of your most recent lab tests.  Please take note of any instructions provided or changes to medications that have resulted from your lab work. lyme test is negative.  Hepatitis panel is negative.  Uric acid level is normal. You should be contacted about your referral to rheumatology.   Please keep your follow up appointment in April.   Sincerely Yours,    Lemont Fillers FNP

## 2010-09-10 NOTE — Assessment & Plan Note (Signed)
Summary: joint pain all over//lch   Vital Signs:  Patient profile:   45 year old female Height:      61.5 inches Weight:      128.50 pounds BMI:     23.97 Temp:     98.2 degrees F oral Pulse rate:   76 / minute Pulse rhythm:   regular Resp:     16 per minute BP sitting:   90 / 60  (left arm) Cuff size:   regular CC: room 4  Pt states she has had joint pain all over her body for many years. Pain has gotten unbearable for the last 3 months. Comments Pt states the pain interferes with her daily living activities.    CC:  room 4  Pt states she has had joint pain all over her body for many years. Pain has gotten unbearable for the last 3 months..  History of Present Illness: Martha Moss is a 45 year old female with c/o joint pain for many years which has become severe in the last few months.  Notes pain in "all my joints."  Worst in right wrist and knees ("feels like they won't hold me sometimes."  Notes difficulty opening jars or even holding her tooth brush.  Notes + night sweats but denies fever.  She takes Tylenol arthritis and occasional vicodin without improvement.    Dr. Jac Canavan- neurology  Dr. Rayvon Char (primary care)  Dr. Clearance Coots (OB-GYN)  Allergies (verified): 1)  ! Pcn 2)  ! * Latex 3)  ! Zithromax 4)  ! Erythromycin 5)  ! Benadryl 6)  ! Sulfa  Physical Exam  General:  Well-developed,well-nourished,in no acute distress; alert,appropriate and cooperative throughout examination Lungs:  Normal respiratory effort, chest expands symmetrically. Lungs are clear to auscultation, no crackles or wheezes. Heart:  Normal rate and regular rhythm. S1 and S2 normal without gallop, murmur, click, rub or other extra sounds. Msk:  no joint swelling, no joint warmth, no redness over joints, and no joint deformities.   + crepitus noted bilateral knees right greater than left.  + pain right wrist with flexion/extension   Impression & Recommendations:  Problem # 1:   POLYARTHRITIS (ICD-719.49) Assessment Deteriorated Will order basic work up to rule out autoimmune etiology.  I am concerned re:  possiblity of RA.  Plan for f/u in 2 weeks.   Orders: T-Comprehensive Metabolic Panel [id number]) T-CBC w/Diff [id number]) Sed Rate (ESR)-[facility] [id number]) T-ANA [id number]) T-RA (Rheumatoid Factor) [id number])  Complete Medication List: 1)  Topamax 200 Mg Tabs (Topiramate) .... Take 1 tablet by mouth once a day 2)  Celexa 20 Mg Tabs (Citalopram hydrobromide) .... Take 1 tablet by mouth once a day 3)  Ambien Cr 12.5 Mg Cr-tabs (Zolpidem tartrate) .... Take 1 tab by mouth at bedtime  Patient Instructions: 1)  Please follow up in 2 weeks.    Current Allergies (reviewed today): ! PCN ! * LATEX ! ZITHROMAX ! ERYTHROMYCIN ! BENADRYL ! SULFA   Preventive Care Screening  Mammogram:    Date:  08/13/2009    Results:  normal   Last Tetanus Booster:    Date:  08/14/2008    Results:  Historical

## 2010-09-12 NOTE — Progress Notes (Signed)
Summary: refill--topamax  Phone Note Refill Request Message from:  Fax from Pharmacy on August 14, 2010 8:50 AM  Refills Requested: Medication #1:  TOPAMAX 200 MG TABS 1 by mouth once daily   Dosage confirmed as above?Dosage Confirmed   Brand Name Necessary? No   Supply Requested: 1 month   Last Refilled: 07/19/2010 [facility] [address] [place]    Method Requested: Electronic Next Appointment Scheduled: none Initial call taken by: Roselle Locus,  August 14, 2010 8:51 AM  Follow-up for Phone Call        could you please call pharmacy and see who the prescriber was? I have not prescribed this medication before. Follow-up by: Lemont Fillers FNP,  August 14, 2010 10:12 AM  Additional Follow-up for Phone Call Additional follow up Details #1::        Per Scarlett at [facility], med was prescribed by Dr Darrin Nipper.  She could not get a fax to go through to them and thought the address listed was to a hospital.  She states she will pull the hard script and send the request to them. Per Efraim Kaufmann, if previous doctor is unable to fill script, we can refill 1 time and contact pt for appt before further refills will be needed as we have not prescribed this for pt before. Nicki Guadalajara Fergerson CMA Duncan Dull)  August 14, 2010 10:55 AM

## 2010-10-08 ENCOUNTER — Telehealth: Payer: Self-pay | Admitting: Family

## 2010-10-09 ENCOUNTER — Encounter: Payer: Self-pay | Admitting: Family

## 2010-10-17 NOTE — Progress Notes (Signed)
Summary: refill-citalopram,savella pt assistance  Phone Note Refill Request Message from:  Fax from Pharmacy on October 08, 2010 11:34 AM  Refills Requested: Medication #1:  CELEXA 20 MG TABS Take 1 tablet by mouth once a day   Dosage confirmed as above?Dosage Confirmed   Brand Name Necessary? No   Supply Requested: 1 month   Last Refilled: 09/07/2010 [facility] [address] [place] [address] fax [phone]   Method Requested: Electronic Next Appointment Scheduled: none Initial call taken by: Elba Barman,  October 08, 2010 11:34 AM  Follow-up for Phone Call        Spoke to pt's husband. Requests that we call back in 1 hour to reach pt. Can refill for 30 days only, needs appt. Nicki Guadalajara Fergerson CMA Duncan Dull)  October 08, 2010 2:19 PM   Additional Follow-up for Phone Call Additional follow up Details #1::        Pt returned my call and scheduled f/u for 10/23/10. States she is still without insurance or job.  Will send Citalopram refill for 30 days supply, printed Savella pt assistance forms and forwarded to Provider for signature.  Will print Savella rx for 3 month supply to attach to pt assist forms. Nicki Guadalajara Fergerson CMA (AAMA)  October 08, 2010 4:21 PM     Prescriptions: SAVELLA 50 MG TABS (MILNACIPRAN HCL) one tablet by mouth two times a day  #180 x 0   Entered by:   Mervin Kung CMA (AAMA)   Authorized by:   Lemont Fillers FNP   Signed by:   Mervin Kung CMA (AAMA) on 10/08/2010   Method used:   Printed then mailed to ...       [facility]* (retail)       [address]       [place], [place]  [address]       Ph: [phone]       Fax: [phone]   RxID:   [id number] CELEXA 20 MG TABS (CITALOPRAM HYDROBROMIDE) Take 1 tablet by mouth once a day  #30 x 0   Entered by:   Mervin Kung CMA (AAMA)   Authorized by:   Lemont Fillers FNP   Signed by:   Mervin Kung CMA (AAMA) on 10/08/2010   Method used:    Electronically to        [facility]* (retail)       [address]       [place], [place]  [address]       Ph: [phone]       Fax: [phone]   RxID:   [id number]   Appended Document: refill-citalopram,savella pt assistance Advised pt that forms are ready for her completion and signature in blue ink. Samples have been left at front desk for pt Lot [id number] Exp 11/08/10 x 4 boxes. Pt will come by Friday to sign papers.  Appended Document: refill-citalopram,savella pt assistance Pt signed papers. Forms copied and sent to MR at [facility] to be scanned. Forms and Rx mailed to [facility], [place].  Patient Assistance Program  [address]  [place] [address].  [phone].

## 2010-10-22 NOTE — Medication Information (Signed)
Summary: Patient Assistance Form/[employer]  Patient Assistance Form/[employer]   Imported By: Lanelle Bal 10/17/2010 09:46:13  _____________________________________________________________________  External Attachment:    Type:   Image     Comment:   External Document

## 2010-10-23 ENCOUNTER — Ambulatory Visit: Payer: Self-pay | Admitting: Family

## 2010-10-28 ENCOUNTER — Encounter: Payer: Self-pay | Admitting: Family

## 2010-10-28 ENCOUNTER — Ambulatory Visit (INDEPENDENT_AMBULATORY_CARE_PROVIDER_SITE_OTHER): Payer: Self-pay | Admitting: Family

## 2010-10-28 DIAGNOSIS — K219 Gastro-esophageal reflux disease without esophagitis: Secondary | ICD-10-CM

## 2010-10-28 DIAGNOSIS — F329 Major depressive disorder, single episode, unspecified: Secondary | ICD-10-CM

## 2010-10-28 DIAGNOSIS — G43009 Migraine without aura, not intractable, without status migrainosus: Secondary | ICD-10-CM

## 2010-10-28 DIAGNOSIS — F418 Other specified anxiety disorders: Secondary | ICD-10-CM | POA: Insufficient documentation

## 2010-10-28 DIAGNOSIS — F32A Depression, unspecified: Secondary | ICD-10-CM | POA: Insufficient documentation

## 2010-10-28 DIAGNOSIS — IMO0001 Reserved for inherently not codable concepts without codable children: Secondary | ICD-10-CM

## 2010-10-28 DIAGNOSIS — F3289 Other specified depressive episodes: Secondary | ICD-10-CM

## 2010-10-28 LAB — CONVERTED CEMR LAB
BUN: 19 mg/dL (ref 6–23)
CO2: 18 meq/L — ABNORMAL LOW (ref 19–32)
Calcium: 9.2 mg/dL (ref 8.4–10.5)
Chloride: 109 meq/L (ref 96–112)
Creatinine, Ser: 0.69 mg/dL (ref 0.40–1.20)
Glucose, Bld: 86 mg/dL (ref 70–99)
Potassium: 4.1 meq/L (ref 3.5–5.3)
Sodium: 139 meq/L (ref 135–145)

## 2010-11-07 NOTE — Assessment & Plan Note (Signed)
Summary: follow up of depression/ss--rm 5   Vital Signs:  Patient profile:   45 year old female Height:      62 inches Weight:      129.50 pounds BMI:     23.77 Temp:     98.2 degrees F oral Pulse rate:   72 / minute Pulse rhythm:   regular Resp:     16 per minute BP sitting:   110 / 70  (left arm) Cuff size:   regular  Vitals Entered By: Mervin Kung CMA Duncan Dull) (October 28, 2010 10:51 AM) CC: Pt here for follow up of depression. Is Patient Diabetic? No Pain Assessment Patient in pain? no      Comments Pt no longer takes Ambien due to memory issues. No longer uses Fluticasone nasal spray. Pt needs refill on Celexa. Nicki Guadalajara Fergerson CMA Duncan Dull)  October 28, 2010 10:59 AM    Primary Care Provider:  Lemont Fillers FNP  CC:  Pt here for follow up of depression.Marland Kitchen  History of Present Illness: Ms.  Wenz is a 45 year old female who presents today for follow up.  She unfortunately, remains uninsured.  1) Fibromyalgia- Savella is causing constpation, using OTC stool softners PRN with good relief.  Fibromyalgia pain is improved.  2) Gerd symptoms- Most days, especially after eating. Reports using OTC prilosec 40mg  PO daily + as needed gaviscon.   3) Depression- continues with celexa, denies problems with mood. Notes some difficulty staying asleep.   4) Migraines-  reports that headaches have been improved with topamax  Allergies: 1)  ! Penicillin 2)  ! Benadryl 3)  ! Sulfa 4)  ! Pcn 5)  ! * Latex 6)  ! Zithromax 7)  ! Erythromycin 8)  ! Benadryl 9)  ! Sulfa  Past History:  Past Medical History: Fibromyalgia Depression Migraines GERD  Review of Systems       see HPI  Physical Exam  General:  Well-developed,well-nourished,in no acute distress; alert,appropriate and cooperative throughout examination Psych:  Cognition and judgment appear intact. Alert and cooperative with normal attention span and concentration. No apparent delusions, illusions,  hallucinations   Impression & Recommendations:  Problem # 1:  FIBROMYALGIA (ICD-729.1) Assessment Unchanged Stable, continue savella.  Pt receives this from patient assistance program.  Problem # 2:  DEPRESSION (ICD-311) Assessment: Comment Only Patient reports that this is stable on celexa.   Her updated medication list for this problem includes:    Celexa 20 Mg Tabs (Citalopram hydrobromide) .Marland Kitchen... Take 1 tablet by mouth once a day  Problem # 3:  MIGRAINE, COMMON (ICD-346.10) Assessment: Comment Only Stable on topamax.  Continue same.   Problem # 4:  GERD (ICD-530.81) Assessment: Comment Only  Her updated medication list for this problem includes:    Omeprazole 20 Mg Cpdr (Omeprazole) .Marland Kitchen..Marland Kitchen Two tablet by mouth daily  Complete Medication List: 1)  Topamax 200 Mg Tabs (Topiramate) .Marland Kitchen.. 1 by mouth once daily 2)  Celexa 20 Mg Tabs (Citalopram hydrobromide) .... Take 1 tablet by mouth once a day 3)  Savella 50 Mg Tabs (Milnacipran hcl) .... One tablet by mouth two times a day 4)  Omeprazole 20 Mg Cpdr (Omeprazole) .... Two tablet by mouth daily  Other Orders: TLB-BMP (Basic Metabolic Panel-BMET) (80048-METABOL)  Patient Instructions: 1)  Please complete your lab work on the first floor. 2)  Please schedule a complete physical at your convenience.   3)  Follow up in 6 months.   Prescriptions: CELEXA 20 MG TABS (  CITALOPRAM HYDROBROMIDE) Take 1 tablet by mouth once a day  #30 x 5   Entered and Authorized by:   Lemont Fillers FNP   Signed by:   Lemont Fillers FNP on 10/28/2010   Method used:   Print then Give to Patient   RxID:   [id number]    Orders Added: 1)  TLB-BMP (Basic Metabolic Panel-BMET) [80048-METABOL] 2)  Est. Patient Level II [21308]    Current Allergies (reviewed today): ! PENICILLIN ! BENADRYL ! SULFA ! PCN ! * LATEX ! ZITHROMAX ! ERYTHROMYCIN ! BENADRYL ! SULFA

## 2010-11-11 ENCOUNTER — Other Ambulatory Visit: Payer: Self-pay | Admitting: *Deleted

## 2010-11-11 NOTE — Telephone Encounter (Signed)
Pt states she lost her Celexa rx and would like [place] to call it in to [facility]. Please advise.

## 2010-11-12 NOTE — Telephone Encounter (Signed)
Pt has left a second message today regarding refill.  Please advise.

## 2010-11-15 MED ORDER — CITALOPRAM HYDROBROMIDE 20 MG PO TABS: 20.0000 mg | ORAL_TABLET | Freq: Every day | ORAL | Status: DC

## 2010-11-18 NOTE — Telephone Encounter (Signed)
Per pharmacist at [facility], they received our electronic refill on 11/15/10 and med is ready for pick up.

## 2010-11-29 ENCOUNTER — Telehealth: Payer: Self-pay | Admitting: Family

## 2010-11-29 NOTE — Telephone Encounter (Signed)
Please advise 

## 2010-11-29 NOTE — Telephone Encounter (Signed)
Pt states she had blood work done a couple of weeks ago. Pt would like to know results

## 2010-12-02 NOTE — Telephone Encounter (Signed)
Reviewed BMET results with patient.

## 2010-12-16 ENCOUNTER — Encounter: Payer: Self-pay | Admitting: Family

## 2010-12-27 NOTE — Discharge Summary (Signed)
NAME:  Martha Moss, Martha Moss                    ACCOUNT NO.:  [id number]   MEDICAL RECORD NO.:  [id number]                   PATIENT TYPE:  OBV   LOCATION:  9308                                 FACILITY:  [facility]   PHYSICIAN:  Charles A. Clearance Coots, M.D.             DATE OF BIRTH:  [birth year]   DATE OF ADMISSION:  12/13/2003  DATE OF DISCHARGE:                                 DISCHARGE SUMMARY   DATE OF SURGERY:  Dec 13, 2003.  Admitted overnight for observation.   DIAGNOSES:  1. Pelvic pain.  2. Right ovarian cyst.  3. Status post operative laparoscopy with right ovarian cystectomy.   A 45 year old white female G3 P3 presented with severe right lower quadrant  pain in the office and on ultrasound was found to have a 5 cm hemorrhagic  right ovarian cyst.  The patient had had moderate to severe pelvic pain for  the last 8 weeks and worsened over the past 3-4 weeks.  She has had a  previous history of right ovarian cyst.  The patient has a surgical history  of previous vaginal hysterectomy and a Burch procedure.  She denied fever,  chills, nausea and vomiting, diarrhea or constipation, or dysuria.  After  the ultrasound it was offered that the patient could manage this  conservatively and possibly wait for resolution of this ovarian cyst over  the next 3-4 weeks but she stated that she could not continue with the  severe pain and desired surgical intervention.   PAST MEDICAL HISTORY:  Surgery:  Vaginal hysterectomy and Burch procedure,  excision of breast lump.  Illnesses:  Interstitial cystitis, fibromyalgia,  hyperlipidemia.   ALLERGIES:  BENADRYL, SULFA, ERYTHROMYCIN, MORPHINE.   MEDICATIONS:  Lipitor, Wellbutrin, Topamax.   SOCIAL HISTORY:  Married.  Negative tobacco, alcohol, or recreational drug  use.   FAMILY HISTORY:  Noncontributory.   PHYSICAL EXAMINATION:  GENERAL:  Well-nourished, well-developed white female  in moderate pelvic pain.  VITAL SIGNS:  Temperature 99, pulse  84, blood pressure 101/52.  HEENT:  Normal.  NECK:  Supple without adenopathy.  LUNGS:  Clear to auscultation bilaterally.  HEART:  Regular rate and rhythm.  BREASTS:  Soft, nontender.  ABDOMEN:  Soft, positive bowel sounds in all quadrants.  Positive tenderness  in the right lower quadrant.  PELVIC:  Normal external female genitalia.  Vaginal mucosa normal.  Cervix  and uterus were absent.  Adnexal exam revealed fullness in the right adnexa  approximately 6 cm, tender.  EXTREMITIES:  Without cyanosis, clubbing, or edema.   IMPRESSION:  Symptomatic hemorrhagic right ovarian cyst, persistent.   PLAN:  Laparoscopic ovarian cystectomy.   LABORATORY VALUES:  Hemoglobin was 12; hematocrit 36; white blood cell count  7500; platelets 331,000.  Basic metabolic panel was within normal limits.  Urine pregnancy test was negative.  Urinalysis was within normal limits.  Coags were within normal limits.   HOSPITAL COURSE:  The patient underwent laparoscopic ovarian cystectomy.  There were no intraoperative complications.  The patient was kept in the  hospital overnight for observation and was discharged home the following  morning on Dec 14, 2003 in good condition.   DISCHARGE DISPOSITION:  1. Medications:  Vicodin Extra Strength, continue preoperative medications.  2. Routine written postoperative instructions per booklet were given.  3. The patient is to follow up in the office for a checkup in 2 weeks.                                               Charles A. Clearance Coots, M.D.    CAH/MEDQ  D:  12/14/2003  T:  12/14/2003  Job:  [id number]

## 2010-12-27 NOTE — Op Note (Signed)
NAME:  Martha Moss, Martha Moss                    ACCOUNT NO.:  [id number]   MEDICAL RECORD NO.:  [id number]                   PATIENT TYPE:  OBV   LOCATION:  9308                                 FACILITY:  [facility]   PHYSICIAN:  Charles A. Clearance Coots, M.D.             DATE OF BIRTH:  [birth year]   DATE OF PROCEDURE:  12/13/2003  DATE OF DISCHARGE:                                 OPERATIVE REPORT   PREOPERATIVE DIAGNOSES:  1. Right ovarian cyst.  2. Pelvic pain.   POSTOPERATIVE DIAGNOSES:  1. Right ovarian cyst.  2. Pelvic pain.   PROCEDURE:  Operative laparoscopy, right ovarian cystectomy.   SURGEONS:  1. Charles A. Clearance Coots, M.D.  2. Roseanna Rainbow, M.D.   ANESTHESIA:  General anesthesia.   ESTIMATED BLOOD LOSS:  50 mL.   COMPLICATIONS:  None.   DATA REVIEWED:  Foley to gravity.   FINDINGS:  Approximately a 6 cm right hemorrhagic ovarian cyst,  approximately 2 cm paraovarian cyst.   DESCRIPTION OF PROCEDURE:  The patient was brought to the operating room and  after satisfactory general endotracheal anesthesia, the abdomen and the  vagina were prepped and draped in the usual sterile fashion.  Foley catheter  was inserted in the urinary bladder with an indwelling drainage to gravity.  A small inferior umbilical incision was made with the scalpel.  Fascia was  grasped with Kocher forceps and the fascia was cut transversely with curved  Mayo scissors down through the fascia and peritoneum.  The facial and  peritoneal incisions were then extended to the left and to the right.  The  Hasson laparoscopic sleeve was then inserted in the incision and the  incision was secured with corner sutures of 0 Vicryl in each corner.  Two 5  mm ports were also placed under direct visualization in each lateral lower  quadrant.  Operative laparoscopy was begun.  The ovary was visualized and  was noted to be approximately 6 cm in diameter.  The ovary was grasped with  atraumatic grasping forceps  and was cut through the capsule with sharp  dissecting shears.  The incision was extended down through the capsule and  the cyst capsule was also identified and was grasped with atraumatic  graspers and was shelled away from the cortex of the ovary and was removed  through a 10 mm port which was created in the right lower quadrant through  the previous 5 mm port.  The cyst was placed in a bag and was pulled up  through a 10 mm port.  Hemostasis was obtained with the cautery.  Pelvic  cavity, including the ovary, was then thoroughly irrigated with the Nezhat  irrigator and the fluid was aspirated through the Nezhat irrigator and the  cortex and the base of the cyst were examined for hemostasis and there was  no active bleeding noted.  Small paraovarian cyst was also ruptured with  sharp dissection shears and a few  filmy adhesions were also cut that were  between the tube and the ovary.  There was on active bleeding again noted  with further observation and the instruments were then removed from their  respective ports and the incisions were closed.  The lower quadrant 5 and 10  port incisions were closed with 3-0 Vicryl and the umbilical 12 mm port  incision was closed with interrupted sutures through the fascia of 0 Vicryl.  Hemostasis was excellent.  The skin was closed at the 12 mm umbilical port  site with interrupted sutures of 3-0 Vicryl.  The patient tolerated the  procedure well and was transported to the recovery room in satisfactory  condition.                                               Charles A. Clearance Coots, M.D.    CAH/MEDQ  D:  12/13/2003  T:  12/14/2003  Job:  [id number]

## 2010-12-27 NOTE — Op Note (Signed)
   NAME:  Martha Moss, Martha Moss                    ACCOUNT NO.:  [id number]   MEDICAL RECORD NO.:  [id number]                   PATIENT TYPE:  OIB   LOCATION:  NA                                   FACILITY:  [facility]   PHYSICIAN:  Leonie Man, M.D.                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   POSTOPERATIVE DIAGNOSIS:  Right breast fibroadenoma with surrounding  atypical ductal hyperplasia.  Pathology pending.   PROCEDURE:  Excisional biopsy of right breast mass following needle  localization.   SURGEON:  Leonie Man, M.D.   ASSISTANT:  Nurse.   ANESTHESIA:  General.   INDICATIONS FOR PROCEDURE:  The patient is a 45 year old woman with a right  breast mass discovered on breast ultrasound mammography. She underwent core  biopsy of this region that showed a fibroadenoma. Additionally, it shows  some surrounding areas of atypical hyperplasia. She comes to the operating  room now for excisional biopsy after the risks and potential benefits of  surgery have been fully discussed.  All questions answered and consent  obtained.   DESCRIPTION OF PROCEDURE:  Following the induction of satisfactory general  anesthesia, the patient was positioned supinely and the right breast was  prepped and draped to be included in a sterile operative field. A  circumareolar incision is carried down through the skin and subcutaneous  tissue dissecting up to the area of the localizing needle. The localizing  needle is reduced into the primary wound and dissection carried down around  the localizing needle carrying the dissection all the way down to the  pectoralis major muscle. The entire specimen is removed and forwarded for a  regular specimen mammography. Mammography shows that the lesion is well  contained within the specimen. It was then forwarded for pathology and  pathology is pending. Hemostasis was assured with electrocautery. Sponge and  instrument counts verified. Subcutaneous tissues closed with interrupted 3-0  Vicryl sutures and the skin closed with running 5-0 Monocryl suture placed  intradermally. The wound is then reinforced with Steri-Strips and sterile  dressings are applied.  Anesthetic reversed, patient removed from the  operating room to the recovery room in stable condition. She tolerated the  procedure well.                                               Leonie Man, M.D.    PB/MEDQ  D:  05/31/2003  T:  05/31/2003  Job:  [id number]

## 2010-12-27 NOTE — Op Note (Signed)
[facility]  Patient:    Martha Moss, Martha Moss                 MRN: [id number] Proc. Date: 06/02/00 Adm. Date:  [id number] Attending:  Londell Moh CC:         Charles A. Clearance Coots, M.D.   Operative Report  PREOPERATIVE DIAGNOSIS:  Chronic lower urinary tract symptoms, rule out interstitial cystitis.  POSTOPERATIVE DIAGNOSIS:  Interstitial cystitis.  PROCEDURE:  Cystoscopy, urethral calibration, hydrodistention of the bladder, bladder biopsy with cauterization, Marcaine and Pyridium instillation, Marcaine and Kenalog injection.  SURGEON:  Jamison Neighbor, M.D.  ANESTHESIA:  General.  COMPLICATIONS:  None.  DRAINS:  None.  BRIEF HISTORY:  This 45 year old female has a past history of what were initially felt to be chronic urinary tract infections.  Back in 1991, the patient had a cystoscopy and urethral dilation, which showed no real evidence of interstitial cystitis at that time.  She was placed on Macrodantin prophylaxis and did fairly well.  After a recent hysterectomy, the patient thought she had new problems with urinary tract infections, but a careful review of the records showed that she really did not have a lot in the way of positive cultures.  She was treated with multiple different antibiotics and has had significant problems with allergic reactions.  The patient had her antibiotics discontinued.  She was started on prophylaxis and despite normalization of urine, she still had significant problems.  The patients past medical history is remarkable for irritable bowel syndrome, TMJ syndrome, multiple drug allergies, and migraines, which certainly raise the suspicion that her problem might be interstitial cystitis.  Given the fact that her symptoms did not return to normal even with sterilization of her urine, we feel that diagnostic cystoscopy is appropriate.  The patient understands the risks and benefits of the procedure.  She  realizes that she will likely have increased symptoms temporarily after the procedure.  She gave full informed consent.  DESCRIPTION OF PROCEDURE:  After successful induction of general anesthesia, the patient was placed in the dorsal lithotomy position, prepped with Betadine, and draped in the usual sterile fashion.  Careful bimanual examination reveals a modest cystocele but no real true prolapse out beyond the introitus.  There was no evidence of a diverticulum.  There was no real rectocele or enterocele to speak of.  There were no masses on bimanual exam. There was no urethral discharge or vaginal discharge.  The urethra was calibrated and accepted up to a 64 [place] female urethral sound with no evidence whatsoever for stenosis or stricture, ruling out obstruction as a source for her problem.  The bladder was carefully inspected.  It was free of any tumor or stones.  Both ureteral orifices unremarkable in their appearance. They were normal in location.  The mucosa was felt to be normal. Hydrodistention of the bladder was performed.  The bladder was distended to a pressure of 100 cmH2O for five minutes.  When the bladder drained, the patient had a bladder capacity that was diminished at approximately 675 cc with a normal of 1150.  The patient had glomerulations in all four quadrants with a terminal blood-tinge.  The patient was felt to have classic findings for early interstitial cystitis.  The patient had a bladder biopsy performed, and the biopsy site was cauterized.  A mixture of Marcaine and Pyridium was left in the bladder.  Marcaine and Kenalog were injected periurethrally.  A vaginal gauze was placed.  The patient  was given Toradol along with Zofran.  She will be given a B&O suppository in the recovery room.  The patient will be sent home on a prescription for Lorcet 10, Uromax, and doxycycline.  She will return to the office in two or three weeks time.  At that point, we  will consider initiation of therapy with either Elmiron plus antihistamine and antidepressant therapy versus concomitant intravesical instillation therapy with DMSO. DD:  06/02/00 TD:  06/02/00 Job: [id number]

## 2011-02-26 ENCOUNTER — Telehealth: Payer: Self-pay | Admitting: *Deleted

## 2011-02-26 MED ORDER — CITALOPRAM HYDROBROMIDE 20 MG PO TABS: 20.0000 mg | ORAL_TABLET | Freq: Every day | ORAL | Status: DC

## 2011-02-26 MED ORDER — MILNACIPRAN HCL 50 MG PO TABS: 50.0000 mg | ORAL_TABLET | Freq: Two times a day (BID) | ORAL | Status: DC

## 2011-02-26 NOTE — Telephone Encounter (Signed)
Pt came by office and completed forms. She requested that we also send rx for Celexa as pt assistance for both meds come from [facility]. Printed Rx for Celexa and forwarded to Provider for signature along with forms.

## 2011-02-26 NOTE — Telephone Encounter (Signed)
Received call from pt stating she is almost out of her Savella. Pt assistance forms printed and left at front desk for pt signature. Pt requested samples as she will be out before samples are shipped to the office. Advised pt we only have the titration packs and she will need to take medication appropriately to equal her current dose. Pt voices understanding. Savella Rx printed for Advice worker.

## 2011-02-27 NOTE — Telephone Encounter (Signed)
Forms copied and sent for scanning. Attached rxs for celexa and savella and mailed to [facility] pt assistance program.

## 2011-04-04 ENCOUNTER — Telehealth: Payer: Self-pay | Admitting: *Deleted

## 2011-04-04 NOTE — Telephone Encounter (Signed)
Received call from pt requesting status of Savella samples from Pt assistance program. Pt advised that samples have arrived and will be placed at front desk for pick up. Also notified pt that per [facility], Celexa is no longer available under their pt assistance program.

## 2011-04-21 ENCOUNTER — Encounter: Payer: Self-pay | Admitting: Family

## 2011-04-21 ENCOUNTER — Ambulatory Visit (INDEPENDENT_AMBULATORY_CARE_PROVIDER_SITE_OTHER): Payer: Self-pay | Admitting: Family

## 2011-04-21 DIAGNOSIS — IMO0001 Reserved for inherently not codable concepts without codable children: Secondary | ICD-10-CM

## 2011-04-21 DIAGNOSIS — F329 Major depressive disorder, single episode, unspecified: Secondary | ICD-10-CM

## 2011-04-21 DIAGNOSIS — F3289 Other specified depressive episodes: Secondary | ICD-10-CM

## 2011-04-21 DIAGNOSIS — J029 Acute pharyngitis, unspecified: Secondary | ICD-10-CM

## 2011-04-21 NOTE — Assessment & Plan Note (Signed)
Stable on citalopram. Continue same.  

## 2011-04-21 NOTE — Progress Notes (Signed)
Subjective:    Patient ID: Martha Moss, female    DOB: May 12, 1966, 45 y.o.   MRN: [id number]  HPI  Ms.  Moss is a 45 yr old female who presents today to address her complaint of sore throat and to follow up on her depression.  Sore throat- symptoms started 1 week ago- subjective fever and chills.  + ulcers on the back of her throat.  No sick contacts.  Denies cough, nasal congestion.  + ear pain  Depression- reports mood is "ok."  She reports that she "tosses and turns.    Fibromyalgia- feels that she has some pain "here and there." She receives savella through patient assistance.    Review of Systems See HPI  Past Medical History  Diagnosis Date  . Fibromyalgia   . Depression   . Migraine   . GERD (gastroesophageal reflux disease)     History   Social History  . Marital Status: Married    Spouse Name: N/A    Number of Children: N/A  . Years of Education: N/A   Occupational History  . Not on file.   Social History Main Topics  . Smoking status: Never Smoker   . Smokeless tobacco: Never Used  . Alcohol Use: Not on file  . Drug Use: Not on file  . Sexually Active: Not on file   Other Topics Concern  . Not on file   Social History Narrative  . No narrative on file    No past surgical history on file.  No family history on file.  Allergies  Allergen Reactions  . Azithromycin     REACTION: hives  . Diphenhydramine Hcl     REACTION: hives  . Erythromycin     REACTION: hives  . Latex     REACTION: rash  . Penicillins     REACTION: rash, fever  . Sulfonamide Derivatives     REACTION: rash, fever    Current Outpatient Prescriptions on File Prior to Visit  Medication Sig Dispense Refill  . citalopram (CELEXA) 20 MG tablet Take 1 tablet (20 mg total) by mouth daily.  90 tablet  0  . Milnacipran (SAVELLA) 50 MG TABS Take 1 tablet (50 mg total) by mouth 2 (two) times daily.  180 tablet  0  . omeprazole (PRILOSEC) 20 MG capsule Take 40 mg by  mouth daily.        Marland Kitchen topiramate (TOPAMAX) 200 MG tablet Take 200 mg by mouth daily.          BP 110/86  Pulse 66  Temp(Src) 98.2 F (36.8 C) (Oral)  Resp 16  Ht 5\' 2"  (1.575 m)  Wt 132 lb 0.6 oz (59.893 kg)  BMI 24.15 kg/m2       Objective:   Physical Exam  Constitutional: She appears well-developed and well-nourished.  HENT:  Right Ear: Tympanic membrane and ear canal normal.  Left Ear: Tympanic membrane and ear canal normal.  Mouth/Throat: Mucous membranes are normal. No oropharyngeal exudate, posterior oropharyngeal edema or posterior oropharyngeal erythema.       + shallow ulcer noted on posterior hard palate (left)   Cardiovascular: Normal rate and regular rhythm.   Pulmonary/Chest: Effort normal and breath sounds normal.  Skin:       New cartilage piercing noted right ear.  No significant erythema.    Psychiatric: She has a normal mood and affect. Her behavior is normal. Judgment and thought content normal.  Assessment & Plan:

## 2011-04-21 NOTE — Assessment & Plan Note (Signed)
Improved on Savella, continue same.

## 2011-04-21 NOTE — Patient Instructions (Signed)
You may use ibuprofen for pain. Call if your symptoms worsen or if no improvement in 3 days.  Follow up in 6 months.

## 2011-04-21 NOTE — Assessment & Plan Note (Signed)
I offered to check a rapid strep but she declines.  Symptoms likely viral.  I instructed pt to let us know if she is not feeling better in 2-3 days.

## 2011-07-11 ENCOUNTER — Telehealth: Payer: Self-pay | Admitting: *Deleted

## 2011-07-11 MED ORDER — MILNACIPRAN HCL 50 MG PO TABS: 50.0000 mg | ORAL_TABLET | Freq: Two times a day (BID) | ORAL | Status: DC

## 2011-07-11 NOTE — Telephone Encounter (Signed)
Received call from pt that she needs 30 day supply of Savella sent to [facility] and needs pt assist application resubmitted for her next refill. From printed from [facility], pt will come by Monday to complete her portion. Form left at front desk.

## 2011-08-18 ENCOUNTER — Telehealth: Payer: Self-pay | Admitting: *Deleted

## 2011-08-18 MED ORDER — MILNACIPRAN HCL 50 MG PO TABS: 50.0000 mg | ORAL_TABLET | Freq: Two times a day (BID) | ORAL | Status: DC

## 2011-08-18 NOTE — Telephone Encounter (Signed)
Received Savella pt assist paperwork from pt. Still needs pt signature in section 3.1. Left detailed message on cell voicemail that paperwork would be left at the front desk for her to come by and sign. 30 day supply of Savella has been sent to [facility] as requested by pt to last until pt assist can be obtained.

## 2011-08-19 MED ORDER — MILNACIPRAN HCL 50 MG PO TABS: 50.0000 mg | ORAL_TABLET | Freq: Two times a day (BID) | ORAL | Status: DC

## 2011-08-19 NOTE — Telephone Encounter (Signed)
Pt completed paperwork. Rx printed and forwarded with application to Provider for signature. Form will then be mailed to [facility], [employer].  [employer]. [phone]. [place], [place] [address].

## 2011-08-21 NOTE — Telephone Encounter (Signed)
Form/Rx signed and mailed to address below along with [facility] Rx three month supply.

## 2011-09-09 ENCOUNTER — Telehealth: Payer: Self-pay | Admitting: *Deleted

## 2011-09-09 NOTE — Telephone Encounter (Signed)
Received message from pt to call her re: indigestion. Attempted to reach pt and left message on voicemail to return my call.

## 2011-09-10 NOTE — Telephone Encounter (Signed)
Pt states she has been under a lot of stress recently. She just lost her mom, states she is recovering from surgery. Has been taking Prilosec OTC once a day and using Gaviscon for her GERD. Recently this regimen does not seem to be helping and she is "going through a lot of Gaviscon."  Pt states she is having burning in her chest and sometimes has nausea. Denies radiating pain or shortness of breath. Has tried other meds in the past like Zantac and nexium. Thinks she has tried most OTC GERD meds and Prilosec was the most helpful in the past. Pt wants to know if there is anything else she can try OTC as she does not have insurance. Please advise.

## 2011-09-10 NOTE — Telephone Encounter (Signed)
Left message on voicemail to return my call.  

## 2011-09-10 NOTE — Telephone Encounter (Signed)
I would need to see her in the office for further evaluation before I can make any further recommendations please.

## 2011-09-10 NOTE — Telephone Encounter (Signed)
Advised pt per Melissa's instructions and scheduled her for follow up on Friday @ 2:15pm.  Advised pt to go to ER if symptoms become severe, has radiating chest pain, n/v or shortness of breath. Pt voices understanding.

## 2011-09-12 ENCOUNTER — Ambulatory Visit (INDEPENDENT_AMBULATORY_CARE_PROVIDER_SITE_OTHER): Payer: Self-pay | Admitting: Family

## 2011-09-12 ENCOUNTER — Encounter: Payer: Self-pay | Admitting: Family

## 2011-09-12 DIAGNOSIS — F329 Major depressive disorder, single episode, unspecified: Secondary | ICD-10-CM

## 2011-09-12 DIAGNOSIS — IMO0001 Reserved for inherently not codable concepts without codable children: Secondary | ICD-10-CM

## 2011-09-12 DIAGNOSIS — F3289 Other specified depressive episodes: Secondary | ICD-10-CM

## 2011-09-12 DIAGNOSIS — K219 Gastro-esophageal reflux disease without esophagitis: Secondary | ICD-10-CM

## 2011-09-12 MED ORDER — ESOMEPRAZOLE MAGNESIUM 40 MG PO CPDR: 40.0000 mg | DELAYED_RELEASE_CAPSULE | Freq: Every day | ORAL | Status: DC

## 2011-09-12 NOTE — Progress Notes (Signed)
  Subjective:    Patient ID: Martha Moss, female    DOB: [birth year], 46 y.o.   MRN: [id number]  HPI  Martha Moss is a 46 yr old female who presents today to discuss reflux.    1) GERD- She reports that she has been taking prilosec daily otc.  Despite taking her medication she continues to have GERD symptoms.  These can occur during the day or the night. She uses Gaviscon during the day without much improvement in her symptoms. Sometimes she tastes acid.  She reports that she has had these symptoms for several months.  Denies associated abdominal pain or chest pain other tha the reflux.    She reports recent oral surgery.   FM-  She reports that this is "ok."  Depression-  Lost her mom.        Review of Systems See HPI  Past Medical History  Diagnosis Date  . Fibromyalgia   . Depression   . Migraine   . GERD (gastroesophageal reflux disease)     History   Social History  . Marital Status: Married    Spouse Name: N/A    Number of Children: N/A  . Years of Education: N/A   Occupational History  . Not on file.   Social History Main Topics  . Smoking status: Never Smoker   . Smokeless tobacco: Never Used  . Alcohol Use: Not on file  . Drug Use: Not on file  . Sexually Active: Not on file   Other Topics Concern  . Not on file   Social History Narrative  . No narrative on file    No past surgical history on file.  No family history on file.  Allergies  Allergen Reactions  . Azithromycin     REACTION: hives  . Diphenhydramine Hcl     REACTION: hives  . Erythromycin     REACTION: hives  . Latex     REACTION: rash  . Penicillins     REACTION: rash, fever  . Sulfonamide Derivatives     REACTION: rash, fever    Current Outpatient Prescriptions on File Prior to Visit  Medication Sig Dispense Refill  . citalopram (CELEXA) 20 MG tablet Take 1 tablet (20 mg total) by mouth daily.  90 tablet  0  . Milnacipran (SAVELLA) 50 MG TABS Take 1 tablet  (50 mg total) by mouth 2 (two) times daily.  180 tablet  0  . omeprazole (PRILOSEC) 20 MG capsule Take 40 mg by mouth daily.        Marland Kitchen topiramate (TOPAMAX) 200 MG tablet Take 200 mg by mouth daily.          BP 110/78  Pulse 98  Temp(Src) 98.6 F (37 C) (Oral)  Resp 16  Wt 135 lb (61.236 kg)  SpO2 95%       Objective:   Physical Exam  Constitutional: She appears well-developed and well-nourished. No distress.  HENT:  Head: Normocephalic and atraumatic.  Cardiovascular: Normal rate and regular rhythm.   No murmur heard. Pulmonary/Chest: Effort normal and breath sounds normal. No respiratory distress. She has no wheezes. She has no rales. She exhibits no tenderness.  Abdominal: Soft.  Skin: Skin is warm and dry.  Psychiatric: She has a normal mood and affect. Her behavior is normal. Judgment and thought content normal.          Assessment & Plan:

## 2011-09-12 NOTE — Assessment & Plan Note (Signed)
Deteriorated. Stop prilosec, start Nexium 40mg  daily, #35 tabs samples given + rx and coupon card.  She was given hand out on GERD diet and diet was discussed. She is to contact us if her symptoms worsen or if no improvement.

## 2011-09-12 NOTE — Patient Instructions (Signed)
Please call if symptoms worsen, or if your symptoms do not improve with nexium.

## 2011-09-12 NOTE — Assessment & Plan Note (Signed)
Stable on citalopram, continue same.  

## 2011-09-12 NOTE — Assessment & Plan Note (Signed)
Stable on savella, continue same.

## 2011-09-19 NOTE — Telephone Encounter (Signed)
Per receptionist, pt's husband came by and picked up samples for pt.

## 2011-09-19 NOTE — Telephone Encounter (Signed)
Received 3 bottles of Savella 50mg  from [facility]. Left message for pt to return my call.

## 2011-10-20 ENCOUNTER — Ambulatory Visit: Payer: Self-pay | Admitting: Family

## 2011-10-20 DIAGNOSIS — Z0289 Encounter for other administrative examinations: Secondary | ICD-10-CM

## 2011-10-27 ENCOUNTER — Telehealth: Payer: Self-pay | Admitting: *Deleted

## 2011-10-27 MED ORDER — ESOMEPRAZOLE MAGNESIUM 40 MG PO CPDR: 40.0000 mg | DELAYED_RELEASE_CAPSULE | Freq: Every day | ORAL | Status: DC

## 2011-10-27 NOTE — Telephone Encounter (Signed)
Received message from pt stating she is not eligible to use the nexium card because she does not have insurance. Her cost is going to be $208. Do you want me to initiate pt assistance?

## 2011-10-27 NOTE — Telephone Encounter (Signed)
Notified pt. Forms printed for pt assistance/prescription and forwarded to Provider for completion/signature. Will give pt #4 boxes of Nexium.

## 2011-10-27 NOTE — Telephone Encounter (Signed)
Yes please, in the meantime we can give her samples if available.

## 2011-10-29 MED ORDER — ESOMEPRAZOLE MAGNESIUM 40 MG PO CPDR: 40.0000 mg | DELAYED_RELEASE_CAPSULE | Freq: Every day | ORAL | Status: DC

## 2011-10-29 NOTE — Telephone Encounter (Signed)
Rx signed and placed at front desk with 6 boxes of Nexium and pt assistance forms for pt to pick up, complete and mail to: [facility]. [address] [address]  [place], [place] [address]. Left detailed message on pt's voicemail and to call if any questions.

## 2011-11-24 ENCOUNTER — Telehealth: Payer: Self-pay | Admitting: Family

## 2011-11-24 MED ORDER — CITALOPRAM HYDROBROMIDE 20 MG PO TABS: 20.0000 mg | ORAL_TABLET | Freq: Every day | ORAL | Status: DC

## 2011-11-24 NOTE — Telephone Encounter (Signed)
Refill sent to pharmacy.   

## 2011-11-24 NOTE — Telephone Encounter (Signed)
Rx printed and was called to Annabelle Harman at [facility]

## 2011-11-24 NOTE — Telephone Encounter (Signed)
Refill- citalopram hbr 20mg  tab. Take one tablet by mouth daily. Qty 30 last fill 3.11.13

## 2011-12-19 ENCOUNTER — Other Ambulatory Visit: Payer: Self-pay | Admitting: Family

## 2012-01-14 ENCOUNTER — Telehealth: Payer: Self-pay | Admitting: Family

## 2012-01-14 NOTE — Telephone Encounter (Signed)
OK with me.

## 2012-01-14 NOTE — Telephone Encounter (Signed)
Ok w/ me 

## 2012-01-14 NOTE — Telephone Encounter (Signed)
Patients husband called and states Patient wants to switch to Dr.Tabori, please review and advise

## 2012-01-15 ENCOUNTER — Ambulatory Visit (INDEPENDENT_AMBULATORY_CARE_PROVIDER_SITE_OTHER): Payer: Self-pay | Admitting: Family Medicine

## 2012-01-15 ENCOUNTER — Encounter: Payer: Self-pay | Admitting: Family Medicine

## 2012-01-15 VITALS — BP 108/78 | HR 93 | Temp 99.2°F | Ht 61.5 in | Wt 131.6 lb

## 2012-01-15 DIAGNOSIS — J019 Acute sinusitis, unspecified: Secondary | ICD-10-CM

## 2012-01-15 DIAGNOSIS — F3289 Other specified depressive episodes: Secondary | ICD-10-CM

## 2012-01-15 DIAGNOSIS — F329 Major depressive disorder, single episode, unspecified: Secondary | ICD-10-CM

## 2012-01-15 MED ORDER — BUPROPION HCL ER (XL) 150 MG PO TB24: 150.0000 mg | ORAL_TABLET | Freq: Every day | ORAL | Status: DC

## 2012-01-15 MED ORDER — DOXYCYCLINE HYCLATE 100 MG PO TABS: 100.0000 mg | ORAL_TABLET | Freq: Two times a day (BID) | ORAL | Status: AC

## 2012-01-15 NOTE — Patient Instructions (Addendum)
This is a sinus infection Start the Doxy twice daily w/ food Drink plenty of fluids Add Mucinex to thin your congestion REST! Start the Wellbutrin in addition to the Celexa and Savella Call in 4 weeks and let me know how you're doing Hang in there!!!

## 2012-01-15 NOTE — Assessment & Plan Note (Signed)
Deteriorated.  Pt already on celexa and savella.  Hesitant to increase dose of SSRI as pt may develop serotonin syndrome.  Pt obvious upset- no insurance, can't afford counseling.  Will start Wellbutrin in addition to current meds and see if sxs improve.  Pt to call w/ update due to lack of insurance.

## 2012-01-15 NOTE — Progress Notes (Signed)
  Subjective:    Patient ID: Martha Moss, female    DOB: 1966-04-03, 46 y.o.   MRN: [id number]  HPI Sore throat- grandchildren w/ recent hand/foot/mouth.  sxs started 4-5 days ago w/ sore throat, hoarseness.  Developed diarrhea and nausea.  Subjective fevers.  Notes blister in back of throat.  No rashes or sores on hands/feet.  + facial pain/pressure.  Depression- currently on Celexa and savella.  Doesn't feel the Celexa is working.  Recently lost mom and step dad.  Increased stress.  Takes care of 4 grandchildren and does husband's paperwork.  Not interested in anything that may cause weight gain.  Very tearful, upset.  Reports husband doesn't understand- 'thinks i should just get over it'.  'i just don't know what to do.   Review of Systems For ROS see HPI     Objective:   Physical Exam  Vitals reviewed. Constitutional: She appears well-developed and well-nourished. She appears distressed.  HENT:  Head: Normocephalic and atraumatic.  Right Ear: Tympanic membrane normal.  Left Ear: Tympanic membrane normal.  Nose: Mucosal edema and rhinorrhea present. Right sinus exhibits maxillary sinus tenderness and frontal sinus tenderness. Left sinus exhibits maxillary sinus tenderness and frontal sinus tenderness.  Mouth/Throat: Uvula is midline and mucous membranes are normal. Posterior oropharyngeal erythema present. No oropharyngeal exudate.  Eyes: Conjunctivae and EOM are normal. Pupils are equal, round, and reactive to light.  Neck: Normal range of motion. Neck supple.  Cardiovascular: Normal rate, regular rhythm and normal heart sounds.   Pulmonary/Chest: Effort normal and breath sounds normal. No respiratory distress. She has no wheezes.  Lymphadenopathy:    She has no cervical adenopathy.  Psychiatric:       Tearful, anxious          Assessment & Plan:

## 2012-01-15 NOTE — Assessment & Plan Note (Signed)
New.  Pt's sxs and PE consistent w/ infxn.  Start abx.  Reviewed supportive care and red flags that should prompt return.  Pt expressed understanding and is in agreement w/ plan.  

## 2012-01-16 ENCOUNTER — Telehealth: Payer: Self-pay | Admitting: *Deleted

## 2012-01-16 MED ORDER — CIPROFLOXACIN HCL 500 MG PO TABS: 500.0000 mg | ORAL_TABLET | Freq: Two times a day (BID) | ORAL | Status: AC

## 2012-01-16 NOTE — Telephone Encounter (Signed)
rx sent to pharmacy by e-script Pt aware 

## 2012-01-16 NOTE — Telephone Encounter (Signed)
Switch to Cipro 500mg  BID x10 days

## 2012-01-16 NOTE — Telephone Encounter (Signed)
Patient called back stating she left a message this am and has not heard back from anyone. Patient would like a call back today [phone]

## 2012-01-16 NOTE — Telephone Encounter (Signed)
Pt states that pharmacy advise her that the doxy we Rx she is allergic to according to there list. Pt advise that she does have a document reaction to TETRACYCLINES. Pt does not remember what her reaction is to med. Pt allergy list updated.

## 2012-01-27 ENCOUNTER — Other Ambulatory Visit: Payer: Self-pay | Admitting: *Deleted

## 2012-01-27 MED ORDER — MILNACIPRAN HCL 50 MG PO TABS: 50.0000 mg | ORAL_TABLET | Freq: Two times a day (BID) | ORAL | Status: DC

## 2012-01-27 NOTE — Telephone Encounter (Signed)
Pt came into office with daughter and advised she is on her last refill of Sevella, advised we will send to the [address] [facility] per MD Tabori verbal instructions to send medication for pt. Sent via escribe

## 2012-02-03 ENCOUNTER — Telehealth: Payer: Self-pay | Admitting: *Deleted

## 2012-02-03 NOTE — Telephone Encounter (Signed)
Called pt to advise MD Alwyn Ren instructions, pt stated that she has no ride to the UC and does not really want to go per she feels the same as she did before and notes hx of migraines, this nurse then asked pt if she has ever experienced a migraine that lasted range 9 out of 10 for 5 days constantly, pt stated "Well no I have not" I then advised that she can call 911 for transport, pt refused and noted that she does not have insurance and can not afford imaging and would rather see MD Beverely Low, pt made aware earlier that MD Beverely Low is out of the office per call noted after she left for the day, pt understood and still insisted on an apt with MD Beverely Low, offered apt for 02-03-12 at 11:30am, advised that MD Beverely Low may still advise for imaging, however unable to confirm at this point and that MD Beverely Low is more than happy to see her, however MD Alwyn Ren advised UC per pt current/ongoing sxs, pt understood and was again instructed if she feels worse to call 911 or go the UC, accepted apt and placed in chart, also advised pt that this nurse will speak to MD Tabori in the am about her upcoming apt if any changes needs to me made, pt understood

## 2012-02-03 NOTE — Telephone Encounter (Signed)
Received call from CAN rep Vicky advising pt finished her ABT Cipro on 01-30-12, pt still notes sinus drainage and facial pain in the sinus cavity areas(nasal/cheek) however pt main concern is a constant headache range of 9 out of 10 that started on Thursday and sees no relief with Advil 400mg  Q4hrs, please advise in absence of MD Beverely Low

## 2012-02-03 NOTE — Telephone Encounter (Signed)
Agree w/ UC or ER due to severe HA but unfortunately we cannot make pt go.  Will see her in office

## 2012-02-03 NOTE — Telephone Encounter (Signed)
Caller: Manasa/Patient; PCP: Sheliah Hatch.; CB#: [phone]; ; ; Call regarding Sinus Infection;  On ~ 01/21/2012  pt had OV and diagnosed with sinus nfection and prescribed Ceftin - finishing on 01/30/2012 . When she first started rx she was feeling better,  but starting on 01/29/2012  was having increasing  facial pain/ HA  , "behind eyes and cheekbones"    and sinus drainage. No fever.  Taking Advil 400mg   q 4 hrs,  but doesnt help with HA  - last dose at  1400  today.  Rating HA 9/10.  Has been using Saline nasal irrigation and Mucinex per provider. RN  reached Call Provider Immediately DISP for severe HA unrelieved by nonprescription medication per Upper Respiratory Infection protocol -- RN  called office and spoke with Kimberly/ Dr Rennis Golden nurse and she will consult physician and call pt back . RN adv pt to expect call back from office regarding plan of care.

## 2012-02-03 NOTE — Telephone Encounter (Signed)
She describes a headache up to  9-10 for 5 days. She should go to the urgent care [address]; she may needimaging

## 2012-02-04 ENCOUNTER — Encounter: Payer: Self-pay | Admitting: Family Medicine

## 2012-02-04 ENCOUNTER — Ambulatory Visit (INDEPENDENT_AMBULATORY_CARE_PROVIDER_SITE_OTHER): Payer: Self-pay | Admitting: Family Medicine

## 2012-02-04 VITALS — BP 128/82 | HR 105 | Temp 98.8°F | Ht 62.75 in | Wt 129.8 lb

## 2012-02-04 DIAGNOSIS — J019 Acute sinusitis, unspecified: Secondary | ICD-10-CM

## 2012-02-04 NOTE — Patient Instructions (Addendum)
This appears to be a sinus infection Start the Avelox once daily- w/ food Take an Axert b/c I think the sinuses triggered a migraine Call if no improvement or worsening Add Mucinex to thin your congestion Hang in there!!!

## 2012-02-04 NOTE — Telephone Encounter (Signed)
Pt was seen in the office today and treated by MD Beverely Low

## 2012-02-04 NOTE — Progress Notes (Signed)
  Subjective:    Patient ID: Martha Moss, female    DOB: 03-30-1966, 46 y.o.   MRN: [id number]  HPI HA- pt was started on Cipro for sinus infxn at last visit.  Sxs had started to improve but not resolve and when she got down to last pill sxs again returned.  Severe HA 'all weekend long'.  Pain is frontal and maxillary.  Pain w/ bending forward.  Mild dizziness.  Pain worsens w/ standing.  Mild photo and phonophobia.  Mild nausea but pt feels this is due to drainage.  Hx of migraine.   Review of Systems For ROS see HPI     Objective:   Physical Exam  Vitals reviewed. Constitutional: She is oriented to person, place, and time. She appears well-developed and well-nourished. No distress.  HENT:  Head: Normocephalic and atraumatic.  Right Ear: Tympanic membrane normal.  Left Ear: Tympanic membrane normal.  Nose: Mucosal edema and rhinorrhea present. Right sinus exhibits maxillary sinus tenderness and frontal sinus tenderness. Left sinus exhibits maxillary sinus tenderness and frontal sinus tenderness.  Mouth/Throat: Uvula is midline and mucous membranes are normal. Posterior oropharyngeal erythema present. No oropharyngeal exudate.  Eyes: Conjunctivae and EOM are normal. Pupils are equal, round, and reactive to light.  Neck: Normal range of motion. Neck supple.  Cardiovascular: Normal rate, regular rhythm and normal heart sounds.   Pulmonary/Chest: Effort normal and breath sounds normal. No respiratory distress. She has no wheezes.  Lymphadenopathy:    She has no cervical adenopathy.  Neurological: She is alert and oriented to person, place, and time. No cranial nerve deficit. Coordination normal.          Assessment & Plan:

## 2012-02-08 NOTE — Assessment & Plan Note (Signed)
Pt's sxs consistent w/ infxn which likely triggered migraine.  Start Avelox due to pt's numerous allergies.  Samples provided due to lack of insurance.  Pt to tx migraine w/ axert.  Reviewed supportive care and red flags that should prompt return.  Pt expressed understanding and is in agreement w/ plan.

## 2012-02-20 ENCOUNTER — Other Ambulatory Visit: Payer: Self-pay | Admitting: Family

## 2012-02-20 NOTE — Telephone Encounter (Signed)
rx celexa sent to [facility] pharmacy

## 2012-04-20 ENCOUNTER — Telehealth: Payer: Self-pay | Admitting: Family Medicine

## 2012-04-20 MED ORDER — BUPROPION HCL ER (XL) 150 MG PO TB24: 150.0000 mg | ORAL_TABLET | Freq: Every day | ORAL | Status: DC

## 2012-04-20 NOTE — Telephone Encounter (Signed)
Martha Moss [phone]: Patient calling to see if she can get her Welbutrin in a 90 day supply instead of 30 day supply. Please call her back. Thanks

## 2012-04-20 NOTE — Telephone Encounter (Signed)
Ok for 90 day supply w/ 3 refills

## 2012-04-20 NOTE — Telephone Encounter (Signed)
rx sent to pharmacy by e-script Pt aware 

## 2012-04-29 ENCOUNTER — Telehealth: Payer: Self-pay | Admitting: Family Medicine

## 2012-04-29 MED ORDER — MILNACIPRAN HCL 50 MG PO TABS: 50.0000 mg | ORAL_TABLET | Freq: Two times a day (BID) | ORAL | Status: DC

## 2012-04-29 NOTE — Telephone Encounter (Signed)
Pt left vm on triage line stating she would like a 90 day supply of her savella. It will save her almost $200 per month. Call back # [phone]

## 2012-05-17 ENCOUNTER — Telehealth: Payer: Self-pay | Admitting: Family Medicine

## 2012-05-17 NOTE — Telephone Encounter (Signed)
MD Beverely Low made aware verbally that pt has been informed via CAN

## 2012-05-17 NOTE — Telephone Encounter (Signed)
Called pt to delegate verbal instructions given verbally from MD Tabori for the pt to start breaking pill in half for 2 weeks and then she can stop totally, pt unavailable at best number, left vm to call office back

## 2012-05-17 NOTE — Telephone Encounter (Signed)
pt wants to discuss coming off of celexa is having issues with it--troblue sleeping, fatigue, tender breast, dry mouth, frequent urination wt/constipation. Offered appt for Thursday at 2pm, pt stated she has no insurance and cn not afford to come in Cb# [phone]

## 2012-05-17 NOTE — Telephone Encounter (Signed)
Caller: [name]/Patient; Patient Name: Martha Moss; PCP: Sheliah Hatch.; Best Callback Phone Number: [phone].  Patient calling about possibly coming off the celexa and remaining on wellbutrin.  States discussed coming off one of the tablets, but did not decide which one.  Patient states she feels the celexa keeps her awake more than the wellbutrin.  Per Epic, Dr. Beverely Low stated patient may cut tablet in half for two weeks, then stopping it altogether.  Patient has no further questions or concerns at this time.

## 2012-06-16 ENCOUNTER — Encounter: Payer: Self-pay | Admitting: Family Medicine

## 2012-06-16 ENCOUNTER — Ambulatory Visit (INDEPENDENT_AMBULATORY_CARE_PROVIDER_SITE_OTHER): Payer: Self-pay | Admitting: Family Medicine

## 2012-06-16 VITALS — BP 106/78 | HR 94 | Temp 98.3°F | Resp 16 | Wt 133.1 lb

## 2012-06-16 DIAGNOSIS — F3289 Other specified depressive episodes: Secondary | ICD-10-CM

## 2012-06-16 DIAGNOSIS — F329 Major depressive disorder, single episode, unspecified: Secondary | ICD-10-CM

## 2012-06-16 DIAGNOSIS — J019 Acute sinusitis, unspecified: Secondary | ICD-10-CM

## 2012-06-16 MED ORDER — MOXIFLOXACIN HCL 400 MG PO TABS: 400.0000 mg | ORAL_TABLET | Freq: Every day | ORAL | Status: DC

## 2012-06-16 MED ORDER — BUPROPION HCL ER (XL) 300 MG PO TB24: 300.0000 mg | ORAL_TABLET | Freq: Every day | ORAL | Status: DC

## 2012-06-16 NOTE — Progress Notes (Signed)
  Subjective:    Patient ID: Martha Moss, female    DOB: 25-Apr-1966, 46 y.o.   MRN: [id number]  HPI HA- sxs started 1 week ago.  Having facial pain/pressure, bilateral ear fullness, tooth pain.  Mild nausea.  Mild dizziness.  Subjective fever.  + nasal congestion and PND.  + sick contacts.  Depression- has successfully weaned off Celexa but is wondering if she should increase Wellbutrin due to residual sxs.  Review of Systems For ROS see HPI     Objective:   Physical Exam  Vitals reviewed. Constitutional: She appears well-developed and well-nourished. No distress.  HENT:  Head: Normocephalic and atraumatic.  Right Ear: Tympanic membrane normal.  Left Ear: Tympanic membrane normal.  Nose: Mucosal edema and rhinorrhea present. Right sinus exhibits maxillary sinus tenderness and frontal sinus tenderness. Left sinus exhibits maxillary sinus tenderness and frontal sinus tenderness.  Mouth/Throat: Uvula is midline and mucous membranes are normal. Posterior oropharyngeal erythema present. No oropharyngeal exudate.  Eyes: Conjunctivae normal and EOM are normal. Pupils are equal, round, and reactive to light.  Neck: Normal range of motion. Neck supple.  Cardiovascular: Normal rate, regular rhythm and normal heart sounds.   Pulmonary/Chest: Effort normal and breath sounds normal. No respiratory distress. She has no wheezes.  Lymphadenopathy:    She has no cervical adenopathy.          Assessment & Plan:

## 2012-06-16 NOTE — Assessment & Plan Note (Signed)
Chronic problem, has successfully weaned off SSRI due to side effects.  Will increase Wellbutrin to 300 daily.

## 2012-06-16 NOTE — Patient Instructions (Addendum)
This is a sinus infection Start the Avelox- 1 tab daily w/ food Drink plenty of fluids REST! Take 2 of the Wellbutrin you have at home, when you pick up the new prescription you'll go back to 1 tab Call with any questions or concerns Hang in there!

## 2012-06-16 NOTE — Assessment & Plan Note (Signed)
Pt's sxs and PE consistent w/ infxn.  Start abx.  Reviewed supportive care and red flags that should prompt return.  Pt expressed understanding and is in agreement w/ plan.  

## 2012-06-23 ENCOUNTER — Telehealth: Payer: Self-pay | Admitting: Family Medicine

## 2012-06-23 MED ORDER — CIPROFLOXACIN HCL 500 MG PO TABS: 500.0000 mg | ORAL_TABLET | Freq: Two times a day (BID) | ORAL | Status: DC

## 2012-06-23 NOTE — Telephone Encounter (Signed)
Caller: [name]/Patient; Patient Name: Martha Moss; PCP: Sheliah Hatch.; Best Callback Phone Number: [phone] Patient calling back and states has missed call from office. Advised per EPIC, can start Cipro 500mg  po BID x 7 days and called to [facility]/[phone].

## 2012-06-23 NOTE — Telephone Encounter (Signed)
Left message to call office

## 2012-06-23 NOTE — Telephone Encounter (Signed)
Patient calling, she completed the Avalox for her sinus infection today but continues to have facial pressure and teeth pain. No fever.  Her sx are better but still present. Tylenol and Motrin do not help with the sx.  Asking if she should have additional medication or does she need a f/u appt?  Uses [facility]  Her best contact # [phone].

## 2012-06-23 NOTE — Telephone Encounter (Signed)
We can start Cipro 500mg  BID x7 more days (we don't have any additional avelox or levaquin samples and she's allergic to most other abx)

## 2012-06-23 NOTE — Telephone Encounter (Signed)
Noted  

## 2012-07-21 ENCOUNTER — Telehealth: Payer: Self-pay | Admitting: Family Medicine

## 2012-07-21 NOTE — Telephone Encounter (Signed)
savella 50 mg tablet Qty:60 Take 1 tablet by mouth 2 times daily

## 2012-07-22 ENCOUNTER — Other Ambulatory Visit: Payer: Self-pay | Admitting: *Deleted

## 2012-07-22 DIAGNOSIS — F329 Major depressive disorder, single episode, unspecified: Secondary | ICD-10-CM

## 2012-07-22 DIAGNOSIS — F32A Depression, unspecified: Secondary | ICD-10-CM

## 2012-07-22 MED ORDER — MILNACIPRAN HCL 50 MG PO TABS: 50.0000 mg | ORAL_TABLET | Freq: Two times a day (BID) | ORAL | Status: DC

## 2012-07-22 NOTE — Addendum Note (Signed)
Addended by: Arnette Norris on: 07/22/2012 01:22 PM   Modules accepted: Orders

## 2012-07-23 MED ORDER — MILNACIPRAN HCL 50 MG PO TABS: 50.0000 mg | ORAL_TABLET | Freq: Two times a day (BID) | ORAL | Status: DC

## 2012-11-08 ENCOUNTER — Telehealth: Payer: Self-pay | Admitting: Family Medicine

## 2012-11-08 NOTE — Telephone Encounter (Signed)
Opened in error. BC °

## 2012-11-10 ENCOUNTER — Telehealth: Payer: Self-pay | Admitting: Family Medicine

## 2012-11-10 NOTE — Telephone Encounter (Signed)
Pt is out of refills and states she has new insurance and pharm siad she needed something, pls call pt. Milnacipran (SAVELLA) 50 MG TABS

## 2012-11-11 ENCOUNTER — Telehealth: Payer: Self-pay | Admitting: *Deleted

## 2012-11-11 NOTE — Telephone Encounter (Signed)
Found prior auth- will have [employer] attempt to contact them since we could not reach them previously

## 2012-11-11 NOTE — Telephone Encounter (Signed)
Pt left a msg on triage checking the status on this med. Pt states she is on her 2nd day of being without this med. Pt states she still has refills on file but her insurance is requiring a prior auth. Please advise.

## 2012-11-11 NOTE — Telephone Encounter (Signed)
Contacted [employer] to obtain PA for [employer]. PA obtained and good until April 2017. Rx called to pharmacy. Pts husband notified.

## 2012-11-18 NOTE — Telephone Encounter (Signed)
Spoke with the pt and she wanted to know if she is going to have to have PA on all her meds or just this one.   Informed her that I did not know it depends on her ins.  Told the pt that she can check with her pharmacy and she what they say.  Pt stated that she will check with them.//AB/CMA

## 2013-01-28 ENCOUNTER — Other Ambulatory Visit: Payer: Self-pay | Admitting: *Deleted

## 2013-01-28 NOTE — Telephone Encounter (Signed)
Received refill request from [facility] for Bupropion HCL XL 150mg , but on the pt's med list the pt is on Bupropion XL 300mg .  LM @ (8:57am) asking the pt to RTC regarding what dose she is taking.//AB/CMA

## 2013-02-01 NOTE — Telephone Encounter (Signed)
LM @ (4:12pm) asking the pt to RTC regarding med refill.//AB/CMA

## 2013-02-02 ENCOUNTER — Telehealth: Payer: Self-pay | Admitting: Family Medicine

## 2013-02-02 NOTE — Telephone Encounter (Signed)
Patient Information:  Caller Name: Lyn Hollingshead  Phone: [phone]  Patient: Vandella, Ord  Gender: Female  DOB: [birth year]  Age: 47 Years  PCP: Sheliah Hatch  Pregnant: No  Office Follow Up:  Does the office need to follow up with this patient?: Yes  Instructions For The Office: [place] received message on 02/01/13 to call office. Does not know reason for message. Message not seen in EPIC   Symptoms  Reason For Call & Symptoms: Anikka states she received a message on 02/01/13 to call Angie in office. Returning call-  A note for telephone encounter not seen in EPIC. Please call Laticha at [phone].  Reviewed Health History In EMR: Yes  Reviewed Medications In EMR: Yes  Reviewed Allergies In EMR: Yes  Reviewed Surgeries / Procedures: Yes  Date of Onset of Symptoms: Unknown OB / GYN:  LMP: Unknown  Guideline(s) Used:  No Protocol Available - Information Only  Disposition Per Guideline:   Discuss with PCP and Callback by Nurse Today  Reason For Disposition Reached:   Nursing judgment  Advice Given:  Call Back If:  You become worse.  Patient Will Follow Care Advice:  YES

## 2013-02-03 ENCOUNTER — Telehealth: Payer: Self-pay | Admitting: *Deleted

## 2013-02-03 NOTE — Telephone Encounter (Signed)
Spoke with the pt on (02-02-13) and informed her we received refill request on her Bupropion 150mg  from [facility].  Asked the pt what dose of Bupropion is she taking?   We have in her chart that she's to be taking 300mg    Pt stated that she was not should but she thought it was 150mg , so she checked her bottle and it says 150mg .   Informed the pt that on 06-16-12 Dr. Beverely Low increased her Bupropion to 300mg    Pt stated that she never increased her dose to 300mg  she has continued on the 150mg .  Pt said she has not been feeling that good, and that could be why.  Pt asked if I could call in the 300mg  tab.  Pt also wanted to know how long it will take to see a difference.  Informed the pt I will check with Dr. Beverely Low and let her know.   Called the pt back and she stated that she spoke with the pharmacy and they informed her that a rx was sent to the pharmacy for the 300mg  and that she picked it up and has been taking it until recently.  Pharmacy informed the pt that an error had been made and the 150mg  tab was refilled instead of the 300mg  tab.  Pt said the pharmacy informed her that she had been on the 300mg  for 4 mos and then the 150mg  was filled by error and that is what she has been taking.  Pt stated that the pharmacy gave her 2 tabs of the 300mg  and she had been taking it since Wed after I spoke with her.  Informed the pt that Dr. Beverely Low said it will take the 1st 2-4 weeks to see a difference.  Pt stated that she will be scheduling an appt with Dr. Beverely Low to discuss everything.  Pt requested that we sent a rx for the Bupropion 300mg  to [facility]. New rx sent to the pharmacy by e-script.//AB/CMA

## 2013-02-03 NOTE — Telephone Encounter (Signed)
Error//AB/CMA 

## 2013-02-04 MED ORDER — BUPROPION HCL ER (XL) 300 MG PO TB24: 300.0000 mg | ORAL_TABLET | Freq: Every day | ORAL | Status: DC

## 2013-02-04 NOTE — Telephone Encounter (Signed)
See telephone encounter.//AB/CMA 

## 2013-02-22 ENCOUNTER — Encounter: Payer: Self-pay | Admitting: Family Medicine

## 2013-02-22 ENCOUNTER — Ambulatory Visit (INDEPENDENT_AMBULATORY_CARE_PROVIDER_SITE_OTHER): Payer: No Typology Code available for payment source | Admitting: Family Medicine

## 2013-02-22 VITALS — BP 108/80 | HR 94 | Temp 98.2°F | Ht 61.5 in | Wt 126.2 lb

## 2013-02-22 DIAGNOSIS — R251 Tremor, unspecified: Secondary | ICD-10-CM

## 2013-02-22 DIAGNOSIS — J019 Acute sinusitis, unspecified: Secondary | ICD-10-CM

## 2013-02-22 DIAGNOSIS — G43009 Migraine without aura, not intractable, without status migrainosus: Secondary | ICD-10-CM

## 2013-02-22 DIAGNOSIS — R259 Unspecified abnormal involuntary movements: Secondary | ICD-10-CM

## 2013-02-22 LAB — CBC WITH DIFFERENTIAL/PLATELET
Basophils Absolute: 0 10*3/uL (ref 0.0–0.1)
Eosinophils Absolute: 0.1 10*3/uL (ref 0.0–0.7)
Lymphocytes Relative: 38.1 % (ref 12.0–46.0)
MCHC: 33.3 g/dL (ref 30.0–36.0)
MCV: 94.5 fl (ref 78.0–100.0)
Monocytes Absolute: 0.5 10*3/uL (ref 0.1–1.0)
Neutrophils Relative %: 52.9 % (ref 43.0–77.0)
Platelets: 307 10*3/uL (ref 150.0–400.0)
RBC: 4.37 Mil/uL (ref 3.87–5.11)
RDW: 13.5 % (ref 11.5–14.6)

## 2013-02-22 LAB — BASIC METABOLIC PANEL
BUN: 19 mg/dL (ref 6–23)
Chloride: 111 mEq/L (ref 96–112)
GFR: 68.6 mL/min (ref >60.00)
Potassium: 3.5 mEq/L (ref 3.5–5.1)
Sodium: 140 mEq/L (ref 135–145)

## 2013-02-22 LAB — TSH: TSH: 1.72 u[IU]/mL (ref 0.35–5.50)

## 2013-02-22 MED ORDER — BUTALBITAL-APAP-CAFFEINE 50-325-40 MG PO TABS: 1.0000 | ORAL_TABLET | Freq: Four times a day (QID) | ORAL | Status: DC | PRN

## 2013-02-22 MED ORDER — MOXIFLOXACIN HCL 400 MG PO TABS: 400.0000 mg | ORAL_TABLET | Freq: Every day | ORAL | Status: DC

## 2013-02-22 NOTE — Assessment & Plan Note (Signed)
Insurance no longer covering Axert.  Unable to tolerate imitrex.  Asking for Fioricet as she has taken this w/ good relief of HA.  Script given.

## 2013-02-22 NOTE — Patient Instructions (Addendum)
This is a sinus infection Start the Avelox w/ food Drink plenty of fluids Alternate tylenol and ibuprofen for pain and headache REST! Hang in there!!!

## 2013-02-22 NOTE — Progress Notes (Signed)
  Subjective:    Patient ID: Martha Moss, female    DOB: 1965-09-01, 47 y.o.   MRN: [id number]  HPI Sinus infxn- sxs started 4 days ago.  + facial pain/pressure, nasal congestion, HA 'sinus migraine'.  No fever.  Bilateral ear fullness.  R sided upper tooth pain and facial swelling.  Mild nausea.  No vomiting or diarrhea.  + sick contacts.  Allergic to Macrolides, PCN, sulfa, tetracycline.  Shaky- pt unable to determine when sxs started.  Not related to med changes or adjustments.  Doesn't occur on one particular side- will migrate between arms, legs, R and L.  Also feeling weak and fatigued.  Migraines- pt reports insurance will not pay for axert.  Unable to take imitrex- 'i feel like i'm having a heart attack'.  Daughter (who also has migraines) gave her Fioricet w/ good relief.  Pt would like script.  Review of Systems For ROS see HPI     Objective:   Physical Exam  Vitals reviewed. Constitutional: She appears well-developed and well-nourished. No distress.  HENT:  Head: Normocephalic and atraumatic.  Right Ear: Tympanic membrane normal.  Left Ear: Tympanic membrane normal.  Nose: Mucosal edema and rhinorrhea present. Right sinus exhibits maxillary sinus tenderness and frontal sinus tenderness. Left sinus exhibits no maxillary sinus tenderness and no frontal sinus tenderness.  Mouth/Throat: Uvula is midline and mucous membranes are normal. Posterior oropharyngeal erythema present. No oropharyngeal exudate.  Eyes: Conjunctivae and EOM are normal. Pupils are equal, round, and reactive to light.  Neck: Normal range of motion. Neck supple.  Cardiovascular: Normal rate, regular rhythm and normal heart sounds.   Pulmonary/Chest: Effort normal and breath sounds normal. No respiratory distress. She has no wheezes.  Lymphadenopathy:    She has no cervical adenopathy.          Assessment & Plan:

## 2013-02-22 NOTE — Addendum Note (Signed)
Addended by: Candie Echevaria L on: 02/22/2013 05:08 PM   Modules accepted: Orders

## 2013-02-22 NOTE — Assessment & Plan Note (Signed)
Pt's sxs and PE consistent w/ infxn.  Due to pt's multiple drug allergies treatment options are limited.  Will use Avelox which pt has had previously.  Reviewed supportive care and red flags that should prompt return.  Pt expressed understanding and is in agreement w/ plan.

## 2013-02-22 NOTE — Assessment & Plan Note (Signed)
New.  Pt unable to give accurate time table of sxs.  Reports eating regularly which would make hypoglycemia less likely.  Pt has hx of anxiety and depression and wonder if this is playing a role.  Check labs to r/o metabolic abnormality.

## 2013-02-24 ENCOUNTER — Telehealth: Payer: Self-pay | Admitting: Family Medicine

## 2013-02-24 NOTE — Telephone Encounter (Signed)
Patient is calling requesting lab results from 02/22/13. Please advise.

## 2013-02-25 ENCOUNTER — Telehealth: Payer: Self-pay

## 2013-02-25 NOTE — Telephone Encounter (Signed)
Patient requesting new MyChart code.

## 2013-02-25 NOTE — Telephone Encounter (Signed)
Advised patient of labs, per chart. GF/RN

## 2013-03-14 ENCOUNTER — Ambulatory Visit (INDEPENDENT_AMBULATORY_CARE_PROVIDER_SITE_OTHER): Payer: No Typology Code available for payment source | Admitting: Family Medicine

## 2013-03-14 ENCOUNTER — Encounter: Payer: Self-pay | Admitting: Family Medicine

## 2013-03-14 VITALS — BP 98/70 | HR 95 | Temp 98.3°F | Ht 61.5 in | Wt 129.2 lb

## 2013-03-14 DIAGNOSIS — M722 Plantar fascial fibromatosis: Secondary | ICD-10-CM

## 2013-03-14 MED ORDER — NAPROXEN 500 MG PO TABS: 500.0000 mg | ORAL_TABLET | Freq: Two times a day (BID) | ORAL | Status: DC

## 2013-03-14 MED ORDER — EPINEPHRINE 0.3 MG/0.3ML IJ SOAJ: 0.3000 mg | Freq: Once | INTRAMUSCULAR | Status: DC

## 2013-03-14 NOTE — Assessment & Plan Note (Signed)
New.  Pt's pain is severe and limiting ambulation.  Start scheduled NSAIDs, ice, stretching, good arch support in footwear.  If no improvement will refer to sports med.  Reviewed supportive care and red flags that should prompt return.  Pt expressed understanding and is in agreement w/ plan.

## 2013-03-14 NOTE — Patient Instructions (Addendum)
This appears to be plantar fasciitis ICE 2-3x/day minimum Start the naproxen twice daily- take w/ food Wear good arch supportive shoes If no improvement in 2-4 weeks- call me and we'll get you set up w/ sports med [place] in there!!!

## 2013-03-14 NOTE — Progress Notes (Signed)
  Subjective:    Patient ID: Martha Moss, female    DOB: 1966/04/05, 47 y.o.   MRN: [id number]  HPI Foot pain- L foot.  Has hx of similar but typically pain will improve and then return at later date.  Has had 4-5 days of pain.  Pain is located on plantar surface of foot w/ some swelling.  Has taken tylenol, used ice/heat w/out relief.  Pain improves when wearing flip-flop w/ arch support.  No injury.  Pain is worst w/ 1st steps.   Review of Systems For ROS see HPI     Objective:   Physical Exam  Vitals reviewed. Constitutional: She appears well-developed and well-nourished.  Clearly uncomfortable  Cardiovascular: Intact distal pulses.   Musculoskeletal: She exhibits tenderness (over insertion of L plantar fascia). She exhibits no edema.  Neurological: Coordination normal.  Antalgic gait          Assessment & Plan:

## 2013-05-02 ENCOUNTER — Other Ambulatory Visit: Payer: Self-pay | Admitting: Family Medicine

## 2013-05-03 NOTE — Telephone Encounter (Signed)
Last visit: 03/14/13  Last filled: 02/22/13 #20, 0 Refills  Please advise. SW, CMA

## 2013-05-06 ENCOUNTER — Ambulatory Visit (INDEPENDENT_AMBULATORY_CARE_PROVIDER_SITE_OTHER): Payer: No Typology Code available for payment source | Admitting: Family Medicine

## 2013-05-06 ENCOUNTER — Encounter: Payer: Self-pay | Admitting: Family Medicine

## 2013-05-06 VITALS — BP 112/78 | HR 98 | Temp 98.8°F | Wt 130.0 lb

## 2013-05-06 DIAGNOSIS — J019 Acute sinusitis, unspecified: Secondary | ICD-10-CM

## 2013-05-06 MED ORDER — MOXIFLOXACIN HCL 400 MG PO TABS: 400.0000 mg | ORAL_TABLET | Freq: Every day | ORAL | Status: DC

## 2013-05-06 MED ORDER — PROMETHAZINE-DM 6.25-15 MG/5ML PO SYRP: 5.0000 mL | ORAL_SOLUTION | Freq: Four times a day (QID) | ORAL | Status: DC | PRN

## 2013-05-06 NOTE — Progress Notes (Signed)
  Subjective:    Patient ID: Martha Moss, female    DOB: [birth year], 47 y.o.   MRN: [id number]  HPI Sinus- sxs started w/ sore throat 2 weeks ago.  Last developed cough, congestion.  Unable to sleep, 'i feel miserable'.  + facial pressure.  Bilateral ear pain.  No fevers.  + nausea.  No vomiting/diarrhea.  Cough is dry, deep- but not productive.  Taking Mucinex Sinus w/out relief.   Review of Systems For ROS see HPI     Objective:   Physical Exam  Vitals reviewed. Constitutional: She appears well-developed and well-nourished. No distress.  HENT:  Head: Normocephalic and atraumatic.  Right Ear: Tympanic membrane normal.  Left Ear: Tympanic membrane normal.  Nose: Mucosal edema and rhinorrhea present. Right sinus exhibits maxillary sinus tenderness and frontal sinus tenderness. Left sinus exhibits maxillary sinus tenderness and frontal sinus tenderness.  Mouth/Throat: Uvula is midline and mucous membranes are normal. Posterior oropharyngeal erythema present. No oropharyngeal exudate.  Eyes: Conjunctivae and EOM are normal. Pupils are equal, round, and reactive to light.  Neck: Normal range of motion. Neck supple.  Cardiovascular: Normal rate, regular rhythm and normal heart sounds.   Pulmonary/Chest: Effort normal and breath sounds normal. No respiratory distress. She has no wheezes.  + Hacking cough  Lymphadenopathy:    She has no cervical adenopathy.          Assessment & Plan:

## 2013-05-06 NOTE — Assessment & Plan Note (Signed)
Pt's sxs and PE consistent w/ infxn.  Start abx.  Cough meds prn.  Reviewed supportive care and red flags that should prompt return.  Pt expressed understanding and is in agreement w/ plan.  

## 2013-05-06 NOTE — Patient Instructions (Addendum)
This is a sinus infection Start the Avelox daily x10 days- take w/ food Drink plenty of fluids REST! Use the cough syrup as needed Call with any questions or concerns Hang in there!!!!

## 2013-05-23 ENCOUNTER — Encounter: Payer: Self-pay | Admitting: Family Medicine

## 2013-05-23 ENCOUNTER — Ambulatory Visit (INDEPENDENT_AMBULATORY_CARE_PROVIDER_SITE_OTHER): Payer: No Typology Code available for payment source | Admitting: Family Medicine

## 2013-05-23 VITALS — BP 120/76 | HR 98 | Temp 98.1°F | Resp 16 | Wt 130.2 lb

## 2013-05-23 DIAGNOSIS — J301 Allergic rhinitis due to pollen: Secondary | ICD-10-CM

## 2013-05-23 MED ORDER — PREDNISONE 10 MG PO TABS: ORAL_TABLET | ORAL | Status: DC

## 2013-05-23 NOTE — Patient Instructions (Signed)
This is more inflammation than infection Continue the Allegra daily, add the Nasacort daily Start the Prednisone as directed- take w/ food Drink plenty of fluids Hang in there!!!

## 2013-05-23 NOTE — Assessment & Plan Note (Signed)
Deteriorated.  Encouraged pt to start nasal steroid daily.  Continue the Allegra.  Add Prednisone to improve inflammation.  No abx at this time due to no evidence of bacterial infxn and pt w/ multiple medication intolerances.  Reviewed supportive care and red flags that should prompt return.  Pt expressed understanding and is in agreement w/ plan.

## 2013-05-23 NOTE — Progress Notes (Signed)
  Subjective:    Patient ID: Martha Moss, female    DOB: 1965-11-08, 47 y.o.   MRN: [id number]  HPI L ear pain, cough w/ excessive facial pressure.  No fevers.  Pain behind both eyes.  HA.  Mild nausea.  No vomiting.  No diarrhea.  Taking Allegra daily.   Review of Systems For ROS see HPI     Objective:   Physical Exam  Vitals reviewed. Constitutional: She appears well-developed and well-nourished. No distress.  HENT:  Head: Normocephalic and atraumatic.  Right Ear: Tympanic membrane normal.  Left Ear: Tympanic membrane normal.  Nose: Mucosal edema and rhinorrhea present. Right sinus exhibits no maxillary sinus tenderness and no frontal sinus tenderness. Left sinus exhibits no maxillary sinus tenderness and no frontal sinus tenderness.  Mouth/Throat: Mucous membranes are normal. Posterior oropharyngeal erythema (w/ PND) present.  Eyes: Conjunctivae and EOM are normal. Pupils are equal, round, and reactive to light.  Neck: Normal range of motion. Neck supple.  Cardiovascular: Normal rate, regular rhythm and normal heart sounds.   Pulmonary/Chest: Effort normal and breath sounds normal. No respiratory distress. She has no wheezes. She has no rales.  Lymphadenopathy:    She has no cervical adenopathy.          Assessment & Plan:

## 2013-06-10 ENCOUNTER — Other Ambulatory Visit: Payer: Self-pay

## 2013-06-10 DIAGNOSIS — Z1231 Encounter for screening mammogram for malignant neoplasm of breast: Secondary | ICD-10-CM

## 2013-06-16 ENCOUNTER — Other Ambulatory Visit: Payer: Self-pay

## 2013-06-21 ENCOUNTER — Encounter: Payer: Self-pay | Admitting: Lab

## 2013-06-22 ENCOUNTER — Ambulatory Visit (INDEPENDENT_AMBULATORY_CARE_PROVIDER_SITE_OTHER): Payer: No Typology Code available for payment source | Admitting: Family Medicine

## 2013-06-22 ENCOUNTER — Encounter: Payer: Self-pay | Admitting: Family Medicine

## 2013-06-22 VITALS — BP 106/72 | HR 100 | Temp 98.1°F | Resp 16 | Wt 133.2 lb

## 2013-06-22 DIAGNOSIS — M25531 Pain in right wrist: Secondary | ICD-10-CM | POA: Insufficient documentation

## 2013-06-22 DIAGNOSIS — M799 Soft tissue disorder, unspecified: Secondary | ICD-10-CM

## 2013-06-22 DIAGNOSIS — M7989 Other specified soft tissue disorders: Secondary | ICD-10-CM | POA: Insufficient documentation

## 2013-06-22 DIAGNOSIS — G4700 Insomnia, unspecified: Secondary | ICD-10-CM | POA: Insufficient documentation

## 2013-06-22 DIAGNOSIS — M25539 Pain in unspecified wrist: Secondary | ICD-10-CM

## 2013-06-22 MED ORDER — PREDNISONE 10 MG PO TABS: ORAL_TABLET | ORAL | Status: DC

## 2013-06-22 MED ORDER — TRAZODONE HCL 50 MG PO TABS: 25.0000 mg | ORAL_TABLET | Freq: Every evening | ORAL | Status: DC | PRN

## 2013-06-22 NOTE — Patient Instructions (Signed)
Follow up as needed Start the Prednisone as directed for inflammation We'll call you with your ortho appt Start the Trazodone for sleep Call with any questions or concerns Happy Thanksgiving!

## 2013-06-22 NOTE — Progress Notes (Signed)
  Subjective:    Patient ID: Martha Moss, female    DOB: 10/20/1965, 47 y.o.   MRN: [id number]  HPI Pre visit review using our clinic review tool, if applicable. No additional management support is needed unless otherwise documented below in the visit note.  R wrist pain and forearm pain- sxs started ~1 month ago.  No known injury.  No recent change in activity level.  Pain radiates up are.  Worse w/ movement.  Nothing improves pain.  Has tried heat/ice.  No relief w/ naproxen.  No numbness or tingling.  No noted weakness.  R hand dominant.  Has known soft tissue mass in R forearm  Insomnia- taking tylenol PM w/out relief.  No difficulty falling asleep- trouble is staying asleep.  Waking multiple times/night.  Previously on Ambien but 'did not have good reactions to that'.   Review of Systems For ROS see HPI     Objective:   Physical Exam  Vitals reviewed. Constitutional: She is oriented to person, place, and time. She appears well-developed and well-nourished. No distress.  Musculoskeletal: She exhibits tenderness (over R forearm, particularly over soft tissue mass mid forearm). She exhibits no edema.  Pain w/ R wrist extension No pain w/ elbow flexion/extension  Neurological: She is alert and oriented to person, place, and time. She has normal reflexes. No cranial nerve deficit. Coordination normal.  Skin: Skin is warm and dry.          Assessment & Plan:

## 2013-06-26 NOTE — Assessment & Plan Note (Signed)
New.  Start Trazodone.  Will follow.

## 2013-06-26 NOTE — Assessment & Plan Note (Signed)
New.  R forearm.  Causing pain.  Prednisone for pain/inflammation.  Refer for surgical evaluation.  Pt expressed understanding and is in agreement w/ plan.

## 2013-06-26 NOTE — Assessment & Plan Note (Signed)
New.  Prednisone for pain/inflammation.  Refer for evaluation and tx.  Pt expressed understanding and is in agreement w/ plan.

## 2013-07-05 ENCOUNTER — Other Ambulatory Visit: Payer: Self-pay | Admitting: *Deleted

## 2013-07-05 DIAGNOSIS — N390 Urinary tract infection, site not specified: Secondary | ICD-10-CM

## 2013-07-05 MED ORDER — NITROFURANTOIN MONOHYD MACRO 100 MG PO CAPS: 100.0000 mg | ORAL_CAPSULE | Freq: Two times a day (BID) | ORAL | Status: DC

## 2013-07-11 ENCOUNTER — Ambulatory Visit
Admission: RE | Admit: 2013-07-11 | Discharge: 2013-07-11 | Disposition: A | Discharge status: Home / Self Care | Payer: No Typology Code available for payment source | Source: Ambulatory Visit

## 2013-07-11 DIAGNOSIS — Z1231 Encounter for screening mammogram for malignant neoplasm of breast: Secondary | ICD-10-CM

## 2013-07-13 ENCOUNTER — Other Ambulatory Visit: Payer: Self-pay | Admitting: Family Medicine

## 2013-07-13 DIAGNOSIS — R928 Other abnormal and inconclusive findings on diagnostic imaging of breast: Secondary | ICD-10-CM

## 2013-07-15 ENCOUNTER — Ambulatory Visit
Admission: RE | Admit: 2013-07-15 | Discharge: 2013-07-15 | Disposition: A | Discharge status: Home / Self Care | Payer: No Typology Code available for payment source | Source: Ambulatory Visit | Attending: Family Medicine | Admitting: Family Medicine

## 2013-07-15 DIAGNOSIS — R928 Other abnormal and inconclusive findings on diagnostic imaging of breast: Secondary | ICD-10-CM

## 2013-09-17 ENCOUNTER — Other Ambulatory Visit: Payer: Self-pay | Admitting: Family Medicine

## 2013-09-19 NOTE — Telephone Encounter (Signed)
Med filled.  

## 2013-10-12 ENCOUNTER — Other Ambulatory Visit: Payer: Self-pay | Admitting: Family Medicine

## 2013-10-12 NOTE — Telephone Encounter (Signed)
Med filled.  

## 2013-10-13 ENCOUNTER — Other Ambulatory Visit: Payer: Self-pay | Admitting: Family Medicine

## 2013-10-14 NOTE — Telephone Encounter (Signed)
Last ov 06-22-13 Med filled 06-22-13 #30 with 3

## 2013-11-19 ENCOUNTER — Encounter: Payer: Self-pay | Admitting: Internal Medicine

## 2013-11-19 ENCOUNTER — Ambulatory Visit (INDEPENDENT_AMBULATORY_CARE_PROVIDER_SITE_OTHER): Payer: No Typology Code available for payment source | Admitting: Internal Medicine

## 2013-11-19 VITALS — BP 118/80 | HR 99 | Temp 97.9°F | Resp 20 | Ht 61.5 in | Wt 132.0 lb

## 2013-11-19 DIAGNOSIS — J019 Acute sinusitis, unspecified: Secondary | ICD-10-CM | POA: Insufficient documentation

## 2013-11-19 MED ORDER — CLINDAMYCIN HCL 300 MG PO CAPS: 300.0000 mg | ORAL_CAPSULE | Freq: Three times a day (TID) | ORAL | Status: DC

## 2013-11-19 MED ORDER — FLUCONAZOLE 150 MG PO TABS: 150.0000 mg | ORAL_TABLET | Freq: Once | ORAL | Status: DC

## 2013-11-19 NOTE — Assessment & Plan Note (Signed)
Seems to have secondary bacterial infection Will treat with clinda Fluconazole prn Supportive care discussed--esp allergy meds

## 2013-11-19 NOTE — Patient Instructions (Signed)
Please take a probiotic while you are on the antibiotic.

## 2013-11-19 NOTE — Progress Notes (Signed)
Pre-visit discussion using our clinic review tool. No additional management support is needed unless otherwise documented below in the visit note.  

## 2013-11-19 NOTE — Progress Notes (Signed)
   Subjective:    Patient ID: Martha BenesSherry L Ceasar, female    DOB: 06/24/1966, 48 y.o.   MRN: [id number]  HPI Had fever of 102.7 yesterday Headaches and congestion for a week Nasonex, zyrtec and mucinex Yesterday right side of face was swollen Some cough Lots of drainage---all post nasal  Some pressure in ears Some advil also  Current Outpatient Prescriptions on File Prior to Visit  Medication Sig Dispense Refill  . buPROPion (WELLBUTRIN XL) 300 MG 24 hr tablet Take 1 tablet (300 mg total) by mouth daily.  90 tablet  3  . butalbital-acetaminophen-caffeine (FIORICET, ESGIC) 50-325-40 MG per tablet TAKE 1-2 TABLETS BY MOUTH EVERY 6 HOURS AS NEEDED FOR HEADACHE  20 tablet  0  . EPINEPHrine (EPIPEN) 0.3 mg/0.3 mL SOAJ injection Inject 0.3 mLs (0.3 mg total) into the muscle once.  1 Device  3  . SAVELLA 100 MG TABS tablet TAKE 1/2 TABLET BY MOUTH TWICE DAILY  30 tablet  3  . topiramate (TOPAMAX) 200 MG tablet Take 200 mg by mouth 2 (two) times daily.       . traZODone (DESYREL) 50 MG tablet TAKE 1/2- 1 TABLET BY MOUTH EVERY NIGHT AT BEDTIME AS NEEDED FOR SLEEP  30 tablet  3   No current facility-administered medications on file prior to visit.    Allergies  Allergen Reactions  . Bee Venom   . Azithromycin     REACTION: hives  . Diphenhydramine Hcl     REACTION: hives  . Erythromycin     REACTION: hives  . Latex     REACTION: rash  . Penicillins     REACTION: rash, fever  . Sulfonamide Derivatives     REACTION: rash, fever  . Tetracyclines & Related     Past Medical History  Diagnosis Date  . Fibromyalgia   . Depression   . Migraine   . GERD (gastroesophageal reflux disease)     No past surgical history on file.  No family history on file.  History   Social History  . Marital Status: Married    Spouse Name: N/A    Number of Children: N/A  . Years of Education: N/A   Occupational History  . Not on file.   Social History Main Topics  . Smoking status: Never  Smoker   . Smokeless tobacco: Never Used  . Alcohol Use: Not on file  . Drug Use: Not on file  . Sexual Activity: Not on file   Other Topics Concern  . Not on file   Social History Narrative  . No narrative on file   Review of Systems Usually gets sinus problems in allergy season No rash No vomiting or diarrhea    Objective:   Physical Exam  Constitutional: She appears well-developed and well-nourished. No distress.  HENT:  Mouth/Throat: Oropharynx is clear and moist. No oropharyngeal exudate.  Marked maxillary and frontal tenderness Moderate nasal inflammation TMs normal  Neck: Normal range of motion. Neck supple. No thyromegaly present.  Pulmonary/Chest: Effort normal and breath sounds normal. No respiratory distress. She has no wheezes. She has no rales.  Lymphadenopathy:    She has no cervical adenopathy.          Assessment & Plan:

## 2013-12-05 ENCOUNTER — Encounter: Payer: Self-pay | Admitting: Family Medicine

## 2013-12-05 ENCOUNTER — Ambulatory Visit (INDEPENDENT_AMBULATORY_CARE_PROVIDER_SITE_OTHER): Payer: No Typology Code available for payment source | Admitting: Family Medicine

## 2013-12-05 VITALS — BP 122/84 | HR 96 | Temp 98.7°F | Resp 16 | Wt 133.0 lb

## 2013-12-05 DIAGNOSIS — J019 Acute sinusitis, unspecified: Secondary | ICD-10-CM

## 2013-12-05 MED ORDER — PROMETHAZINE-DM 6.25-15 MG/5ML PO SYRP: 5.0000 mL | ORAL_SOLUTION | Freq: Four times a day (QID) | ORAL | Status: DC | PRN

## 2013-12-05 MED ORDER — FLUCONAZOLE 150 MG PO TABS: 150.0000 mg | ORAL_TABLET | Freq: Once | ORAL | Status: DC

## 2013-12-05 MED ORDER — MOXIFLOXACIN HCL 400 MG PO TABS: 400.0000 mg | ORAL_TABLET | Freq: Every day | ORAL | Status: DC

## 2013-12-05 NOTE — Assessment & Plan Note (Signed)
Pt's sxs and PE consistent w/ infxn.  Failed Clinda.  Switch to Avelox.  Start promethazine cough syrup.  Reviewed supportive care and red flags that should prompt return.  Pt expressed understanding and is in agreement w/ plan.

## 2013-12-05 NOTE — Progress Notes (Signed)
Pre visit review using our clinic review tool, if applicable. No additional management support is needed unless otherwise documented below in the visit note. 

## 2013-12-05 NOTE — Patient Instructions (Signed)
Follow up as needed Start the Avelox daily as directed- take w/ food Use the cough syrup as needed- will cause drowsiness REST! Continue Mucinex DM Call with any questions or concerns Hang in there!!!

## 2013-12-05 NOTE — Progress Notes (Signed)
   Subjective:    Patient ID: Martha Moss, female    DOB: 11/09/1965, 48 y.o.   MRN: [id number]  Sinusitis Associated symptoms include coughing and headaches.  Headache  Associated symptoms include coughing.  Cough Associated symptoms include headaches.   URI- pt was seen and treated at [facility] for bacterial sinusitis on 4/11.  tx'd w/ Clindamycin.  'i don't even feel like i took an abx'.  Still having facial pain/pressure.  Tm 101 last night.  Taking tylenol, Mucinex, Nasonex.  Bilateral ear pain.  + wet but not productive cough.  + nasal drainage- green.  + sick contacts.  Excessive head congestion and some difficulty w/ balance.   Review of Systems  Respiratory: Positive for cough.   Neurological: Positive for headaches.   For ROS see HPI     Objective:   Physical Exam  Vitals reviewed. Constitutional: She appears well-developed and well-nourished. No distress.  HENT:  Head: Normocephalic and atraumatic.  Right Ear: Tympanic membrane normal.  Left Ear: Tympanic membrane normal.  Nose: Mucosal edema and rhinorrhea present. Right sinus exhibits maxillary sinus tenderness and frontal sinus tenderness. Left sinus exhibits maxillary sinus tenderness and frontal sinus tenderness.  Mouth/Throat: Uvula is midline and mucous membranes are normal. Posterior oropharyngeal erythema present. No oropharyngeal exudate.  Eyes: Conjunctivae and EOM are normal. Pupils are equal, round, and reactive to light.  Neck: Normal range of motion. Neck supple.  Cardiovascular: Normal rate, regular rhythm and normal heart sounds.   Pulmonary/Chest: Effort normal and breath sounds normal. No respiratory distress. She has no wheezes.  Hacking cough  Lymphadenopathy:    She has no cervical adenopathy.          Assessment & Plan:

## 2014-02-14 ENCOUNTER — Other Ambulatory Visit: Payer: Self-pay | Admitting: Family Medicine

## 2014-02-14 NOTE — Telephone Encounter (Signed)
Med filled.  

## 2014-02-16 ENCOUNTER — Encounter: Payer: Self-pay | Admitting: Family Medicine

## 2014-02-16 ENCOUNTER — Ambulatory Visit (INDEPENDENT_AMBULATORY_CARE_PROVIDER_SITE_OTHER): Payer: No Typology Code available for payment source | Admitting: Family Medicine

## 2014-02-16 VITALS — BP 110/78 | HR 102 | Temp 98.8°F | Resp 16 | Wt 131.5 lb

## 2014-02-16 DIAGNOSIS — L258 Unspecified contact dermatitis due to other agents: Secondary | ICD-10-CM

## 2014-02-16 DIAGNOSIS — L230 Allergic contact dermatitis due to metals: Secondary | ICD-10-CM

## 2014-02-16 MED ORDER — TRAZODONE HCL 50 MG PO TABS: ORAL_TABLET | ORAL | Status: DC

## 2014-02-16 NOTE — Patient Instructions (Signed)
Follow up as needed Use hydrocortisone cream as needed for rash or itching Google Epipen coupon and see if you qualify Call with any questions or concerns Have a great summer!

## 2014-02-16 NOTE — Progress Notes (Signed)
   Subjective:    Patient ID: Martha Moss, female    DOB: 07/31/1966, 48 y.o.   MRN: [id number]  HPI Rash- behind L ear, small bumps extending along hairline and across face.  Very itchy.  Occurred w/ costume jewelry.  Husband dx'd w/ shingles and pt is worry about this.    Review of Systems For ROS see HPI     Objective:   Physical Exam  Vitals reviewed. Constitutional: She appears well-developed and well-nourished. No distress.  Skin: Skin is warm and dry. Rash (linear distribution of maculopapular rash behind L ear and extending along L jaw close to ear) noted. No erythema.          Assessment & Plan:

## 2014-02-16 NOTE — Progress Notes (Signed)
Pre visit review using our clinic review tool, if applicable. No additional management support is needed unless otherwise documented below in the visit note. 

## 2014-02-19 DIAGNOSIS — L258 Unspecified contact dermatitis due to other agents: Secondary | ICD-10-CM | POA: Insufficient documentation

## 2014-02-19 NOTE — Assessment & Plan Note (Addendum)
New.  sxs are already improving.  Pt to use hydrocortisone as needed.  Reviewed supportive care and red flags that should prompt return.  Pt expressed understanding and is in agreement w/ plan.

## 2014-02-20 ENCOUNTER — Other Ambulatory Visit: Payer: Self-pay | Admitting: Family Medicine

## 2014-02-20 MED ORDER — TRAZODONE HCL 50 MG PO TABS: ORAL_TABLET | ORAL | Status: DC

## 2014-03-07 ENCOUNTER — Telehealth: Payer: Self-pay | Admitting: Family Medicine

## 2014-03-07 NOTE — Telephone Encounter (Signed)
Caller name: Alex Relation to ZO:XWRUEAVpt:husband Call back number:[phone] Pharmacy:traZODone (DESYREL) 50 MG tablet    Reason for call:  Pt is having allergies again.  Pt is wanting something called in, hopefully whatever you called in last time for her after her visit with allergies.  Please advise.

## 2014-03-07 NOTE — Telephone Encounter (Signed)
Pt was last seen 02/16/14. Please advise.

## 2014-03-07 NOTE — Telephone Encounter (Signed)
Please call pt for more info.  Is she having allergic reaction to her jewelry or more pollen related?  Does she need topical steroid or nasal steroid/antihistamine?

## 2014-03-08 MED ORDER — FLUTICASONE PROPIONATE 50 MCG/ACT NA SUSP: 2.0000 | Freq: Every day | NASAL | Status: DC

## 2014-03-08 NOTE — Telephone Encounter (Signed)
Med filled. Pt notified. Med filled.

## 2014-03-08 NOTE — Telephone Encounter (Signed)
Pt can take Claritin or Zyrtec OTC, send script for Flonase 2 sprays each nostril daily, 6 refills

## 2014-03-08 NOTE — Telephone Encounter (Signed)
Caller name:Alex Relation to ZO:XWRUEApt:spouse Call back number:[phone] Pharmacy:  Reason for call: returned call

## 2014-03-08 NOTE — Telephone Encounter (Signed)
Called to get more information. LMOVM to return call.

## 2014-03-08 NOTE — Telephone Encounter (Signed)
Spoke with pt husband who states it is her allergies. The nasal steroid/antihistamine would be greatly appreciated. [facility] in [place].

## 2014-04-24 ENCOUNTER — Other Ambulatory Visit: Payer: Self-pay | Admitting: Family Medicine

## 2014-04-24 NOTE — Telephone Encounter (Signed)
Med filled.  

## 2014-05-25 ENCOUNTER — Other Ambulatory Visit: Payer: Self-pay | Admitting: Family Medicine

## 2014-05-25 NOTE — Telephone Encounter (Signed)
Med filled but letter mailed to pt to schedule a complete physical as she has never had one with the office.

## 2014-05-26 ENCOUNTER — Other Ambulatory Visit: Payer: Self-pay

## 2014-09-09 ENCOUNTER — Other Ambulatory Visit: Payer: Self-pay | Admitting: Family Medicine

## 2014-09-11 NOTE — Telephone Encounter (Signed)
Med filled.  

## 2014-10-02 ENCOUNTER — Encounter (HOSPITAL_COMMUNITY): Payer: Self-pay | Admitting: *Deleted

## 2014-10-02 DIAGNOSIS — R350 Frequency of micturition: Secondary | ICD-10-CM | POA: Insufficient documentation

## 2014-10-02 DIAGNOSIS — M797 Fibromyalgia: Secondary | ICD-10-CM | POA: Insufficient documentation

## 2014-10-02 DIAGNOSIS — Z79899 Other long term (current) drug therapy: Secondary | ICD-10-CM | POA: Insufficient documentation

## 2014-10-02 DIAGNOSIS — F329 Major depressive disorder, single episode, unspecified: Secondary | ICD-10-CM | POA: Insufficient documentation

## 2014-10-02 DIAGNOSIS — G43909 Migraine, unspecified, not intractable, without status migrainosus: Secondary | ICD-10-CM | POA: Insufficient documentation

## 2014-10-02 DIAGNOSIS — Z7951 Long term (current) use of inhaled steroids: Secondary | ICD-10-CM | POA: Insufficient documentation

## 2014-10-02 DIAGNOSIS — Z8719 Personal history of other diseases of the digestive system: Secondary | ICD-10-CM | POA: Insufficient documentation

## 2014-10-02 DIAGNOSIS — R509 Fever, unspecified: Secondary | ICD-10-CM | POA: Insufficient documentation

## 2014-10-02 DIAGNOSIS — K5900 Constipation, unspecified: Secondary | ICD-10-CM | POA: Insufficient documentation

## 2014-10-02 DIAGNOSIS — Z880 Allergy status to penicillin: Secondary | ICD-10-CM | POA: Insufficient documentation

## 2014-10-02 DIAGNOSIS — Z91040 Latex allergy status: Secondary | ICD-10-CM | POA: Insufficient documentation

## 2014-10-02 NOTE — ED Notes (Signed)
Patient presents stating she has not been able to have a BM for 2 weeks.  Has an appointment tomorrow with her MD but could not wait.  Has tried an enema and laxatives

## 2014-10-03 ENCOUNTER — Ambulatory Visit: Payer: Self-pay | Admitting: Family Medicine

## 2014-10-03 ENCOUNTER — Telehealth: Payer: Self-pay | Admitting: Family Medicine

## 2014-10-03 ENCOUNTER — Emergency Department (HOSPITAL_COMMUNITY)
Admission: EM | Admit: 2014-10-03 | Discharge: 2014-10-03 | Disposition: A | Discharge status: Home / Self Care | Payer: Self-pay | Attending: Emergency Medicine | Admitting: Emergency Medicine

## 2014-10-03 ENCOUNTER — Emergency Department (HOSPITAL_COMMUNITY): Payer: Self-pay

## 2014-10-03 DIAGNOSIS — K5900 Constipation, unspecified: Secondary | ICD-10-CM

## 2014-10-03 LAB — COMPREHENSIVE METABOLIC PANEL
ALBUMIN: 4 g/dL (ref 3.5–5.2)
ALT: 13 U/L (ref 0–35)
ANION GAP: 8 (ref 5–15)
AST: 14 U/L (ref 0–37)
Alkaline Phosphatase: 61 U/L (ref 39–117)
BUN: 15 mg/dL (ref 6–23)
CHLORIDE: 111 mmol/L (ref 96–112)
CO2: 21 mmol/L (ref 19–32)
Calcium: 9.2 mg/dL (ref 8.4–10.5)
Creatinine, Ser: 0.81 mg/dL (ref 0.50–1.10)
GFR calc Af Amer: >90 mL/min (ref >90)
GFR calc non Af Amer: 85 mL/min — ABNORMAL LOW (ref >90)
Glucose, Bld: 94 mg/dL (ref 70–99)
Potassium: 3.6 mmol/L (ref 3.5–5.1)
Sodium: 140 mmol/L (ref 135–145)
Total Bilirubin: 0.3 mg/dL (ref 0.3–1.2)
Total Protein: 6.6 g/dL (ref 6.0–8.3)

## 2014-10-03 LAB — CBC WITH DIFFERENTIAL/PLATELET
BASOS PCT: 1 % (ref 0–1)
Basophils Absolute: 0 10*3/uL (ref 0.0–0.1)
EOS PCT: 2 % (ref 0–5)
Eosinophils Absolute: 0.1 10*3/uL (ref 0.0–0.7)
HEMATOCRIT: 36.4 % (ref 36.0–46.0)
Hemoglobin: 12.2 g/dL (ref 12.0–15.0)
LYMPHS PCT: 57 % — AB (ref 12–46)
Lymphs Abs: 3.3 10*3/uL (ref 0.7–4.0)
MCH: 30.7 pg (ref 26.0–34.0)
MCHC: 33.5 g/dL (ref 30.0–36.0)
MCV: 91.5 fL (ref 78.0–100.0)
MONO ABS: 0.5 10*3/uL (ref 0.1–1.0)
Monocytes Relative: 8 % (ref 3–12)
Neutro Abs: 1.9 10*3/uL (ref 1.7–7.7)
Neutrophils Relative %: 32 % — ABNORMAL LOW (ref 43–77)
PLATELETS: 265 10*3/uL (ref 150–400)
RBC: 3.98 MIL/uL (ref 3.87–5.11)
RDW: 13.1 % (ref 11.5–15.5)
WBC: 5.8 10*3/uL (ref 4.0–10.5)

## 2014-10-03 LAB — URINALYSIS, ROUTINE W REFLEX MICROSCOPIC
Bilirubin Urine: NEGATIVE
GLUCOSE, UA: NEGATIVE mg/dL
HGB URINE DIPSTICK: NEGATIVE
KETONES UR: NEGATIVE mg/dL
Leukocytes, UA: NEGATIVE
Nitrite: NEGATIVE
PH: 7 (ref 5.0–8.0)
PROTEIN: NEGATIVE mg/dL
SPECIFIC GRAVITY, URINE: 1.009 (ref 1.005–1.030)
UROBILINOGEN UA: 0.2 mg/dL (ref 0.0–1.0)

## 2014-10-03 MED ORDER — LACTULOSE 10 GM/15ML PO SOLN: 20.0000 g | Freq: Every day | ORAL | Status: DC | PRN

## 2014-10-03 NOTE — ED Notes (Signed)
Pt returned from scans. Monitored by pulse ox and bp cuff. 

## 2014-10-03 NOTE — ED Provider Notes (Signed)
CSN: [id number]     Arrival date & time 10/02/14  2122 History   This chart was scribed for Martha Raceravid Ardyth Kelso, MD by Abel PrestoKara Demonbreun, ED Scribe. This patient was seen in room A13C/A13C and the patient's care was started at 1:44 AM.    Chief Complaint  Patient presents with  . Constipation     Patient is a 49 y.o. female presenting with constipation. The history is provided by the patient. No language interpreter was used.  Constipation Associated symptoms: abdominal pain, fever and nausea   Associated symptoms: no back pain, no diarrhea, no dysuria and no vomiting    HPI Comments: Martha BenesSherry L Moss is a 49 y.o. female with PMhx of fibromyalgia and GERD who presents to the Emergency Department complaining of constipation for two weeks. Pt notes sharp right sided abdominal pain and fullness with onset today, fever of 101 today which has resolved, nausea, and frequency. Pt has tried enemas and OTC laxative with no relief. Pt notes h/o constipation but states it has never lasted this long.  Pt denies any recent changes in medication, chills, vomiting, and dysuria. Pt's PCP is Dr. Beverely Moss. Pt has an appointment to see PCP tomorrow.   Past Medical History  Diagnosis Date  . Fibromyalgia   . Depression   . Migraine   . GERD (gastroesophageal reflux disease)    Past Surgical History  Procedure Laterality Date  . Abdominal hysterectomy    . Ovarian cyst surgery     No family history on file. History  Substance Use Topics  . Smoking status: Never Smoker   . Smokeless tobacco: Never Used  . Alcohol Use: Yes     Comment: rarely   OB History    No data available     Review of Systems  Constitutional: Positive for fever. Negative for chills.  Gastrointestinal: Positive for nausea, abdominal pain and constipation. Negative for vomiting, diarrhea and abdominal distention.  Genitourinary: Positive for frequency. Negative for dysuria, hematuria and difficulty urinating.  Musculoskeletal:  Negative for myalgias, back pain, neck pain and neck stiffness.  Skin: Negative for rash and wound.  Neurological: Negative for dizziness, weakness, light-headedness, numbness and headaches.  All other systems reviewed and are negative.     Allergies  Bee venom; Azithromycin; Diphenhydramine hcl; Erythromycin; Latex; Penicillins; Sulfonamide derivatives; and Tetracyclines & related  Home Medications   Prior to Admission medications   Medication Sig Start Date End Date Taking? Authorizing Provider  buPROPion (WELLBUTRIN XL) 300 MG 24 hr tablet TAKE 1 TABLET DAILY 04/24/14  Yes Sheliah HatchKatherine E Tabori, MD  butalbital-acetaminophen-caffeine (FIORICET, ESGIC) 50-325-40 MG per tablet TAKE 1-2 TABLETS BY MOUTH EVERY 6 HOURS AS NEEDED FOR HEADACHE 05/02/13  Yes Sheliah HatchKatherine E Tabori, MD  EPINEPHrine (EPIPEN) 0.3 mg/0.3 mL SOAJ injection Inject 0.3 mLs (0.3 mg total) into the muscle once. 03/14/13  Yes Sheliah HatchKatherine E Tabori, MD  fluticasone (FLONASE) 50 MCG/ACT nasal spray Place 2 sprays into both nostrils daily. 03/08/14  Yes Sheliah HatchKatherine E Tabori, MD  SAVELLA 100 MG TABS tablet TAKE 1/2 TABLET BY MOUTH 2 TIMES DAILY. Patient taking differently: TAKE 1 TABLET BY MOUTH at bedtime 09/11/14  Yes Sheliah HatchKatherine E Tabori, MD  topiramate (TOPAMAX) 200 MG tablet Take 200 mg by mouth 2 (two) times daily.    Yes Historical Provider, MD  traZODone (DESYREL) 50 MG tablet TAKE 1/2- 1 TABLET BY MOUTH EVERY NIGHT AT BEDTIME AS NEEDED FOR SLEEP 02/20/14  Yes Sheliah HatchKatherine E Tabori, MD  lactulose (CHRONULAC) 10 GM/15ML solution  Take 30 mLs (20 g total) by mouth daily as needed for moderate constipation or severe constipation. 10/03/14   Martha Racer, MD   BP 107/66 mmHg  Pulse 79  Temp(Src) 98.7 F (37.1 C) (Oral)  Resp 18  Ht  (1.575 m)  Wt 127 lb (57.607 kg)  BMI 23.22 kg/m2  SpO2 95% Physical Exam  Constitutional: She is oriented to person, place, and time. She appears well-developed and well-nourished. No distress.  HENT:   Head: Normocephalic and atraumatic.  Mouth/Throat: Oropharynx is clear and moist.  Eyes: Conjunctivae and EOM are normal. Pupils are equal, round, and reactive to light.  Neck: Normal range of motion. Neck supple.  Cardiovascular: Normal rate and regular rhythm.   Pulmonary/Chest: Effort normal and breath sounds normal. No respiratory distress. She has no wheezes. She has no rales.  Abdominal: Soft. Bowel sounds are normal. She exhibits no distension and no mass. There is no tenderness. There is no rebound and no guarding.  Musculoskeletal: Normal range of motion. She exhibits no edema or tenderness.  No CVA tenderness bilaterally.  Neurological: She is alert and oriented to person, place, and time.  Skin: Skin is warm and dry. No rash noted. No erythema.  Psychiatric: She has a normal mood and affect. Her behavior is normal.  Nursing note and vitals reviewed.   ED Course  Procedures (including critical care time) DIAGNOSTIC STUDIES: Oxygen Saturation is 100% on room air, normal by my interpretation.    COORDINATION OF CARE: 1:48 AM Discussed treatment plan with patient at beside, the patient agrees with the plan and has no further questions at this time.   Labs Review Labs Reviewed  CBC WITH DIFFERENTIAL/PLATELET - Abnormal; Notable for the following:    Neutrophils Relative % 32 (*)    Lymphocytes Relative 57 (*)    All other components within normal limits  COMPREHENSIVE METABOLIC PANEL - Abnormal; Notable for the following:    GFR calc non Af Amer 85 (*)    All other components within normal limits  URINALYSIS, ROUTINE W REFLEX MICROSCOPIC    Imaging Review Dg Abd Acute W/chest  10/03/2014   CLINICAL DATA:  Constipation for 2 weeks. Right lower quadrant pain.  EXAM: ACUTE ABDOMEN SERIES (ABDOMEN 2 VIEW & CHEST 1 VIEW)  COMPARISON:  10/13/2008  FINDINGS: Normal heart size and pulmonary vascularity. No focal airspace disease or consolidation in the lungs. No blunting of  costophrenic angles. No pneumothorax. Mediastinal contours appear intact.  Scattered gas and stool in the colon. No small or large bowel distention. No free intra-abdominal air. No abnormal air-fluid levels. No radiopaque stones. Visualized bones appear intact. Surgical clips in the right upper quadrant.  IMPRESSION: No evidence of active pulmonary disease. Normal nonobstructive bowel gas pattern.   Electronically Signed   By: Burman Nieves M.D.   On: 10/03/2014 02:14     EKG Interpretation None      MDM   Final diagnoses:  Constipation   I personally performed the services described in this documentation, which was scribed in my presence. The recorded information has been reviewed and is accurate.    Abdominal exam remains benign. Vital signs remained stable. X-ray without any evidence of obstruction.  Martha Racer, MD 10/03/14 [phone]

## 2014-10-03 NOTE — Telephone Encounter (Signed)
Pt was seen in the ED 10/02/14 pt forgot she had an appointment.

## 2014-10-03 NOTE — ED Notes (Signed)
Pt A&Ox4, ambulatory at d/c with steady gait, NAD 

## 2014-10-03 NOTE — Telephone Encounter (Signed)
Pt needs no-show fee 

## 2014-10-03 NOTE — Discharge Instructions (Signed)

## 2014-10-05 ENCOUNTER — Telehealth: Payer: Self-pay | Admitting: Family Medicine

## 2014-10-05 NOTE — Telephone Encounter (Signed)
error 

## 2014-10-13 ENCOUNTER — Ambulatory Visit (INDEPENDENT_AMBULATORY_CARE_PROVIDER_SITE_OTHER): Payer: Self-pay | Admitting: Family Medicine

## 2014-10-13 ENCOUNTER — Encounter: Payer: Self-pay | Admitting: Family Medicine

## 2014-10-13 VITALS — BP 126/80 | HR 117 | Temp 98.1°F | Resp 16 | Wt 131.4 lb

## 2014-10-13 DIAGNOSIS — K5904 Chronic idiopathic constipation: Secondary | ICD-10-CM

## 2014-10-13 DIAGNOSIS — K5900 Constipation, unspecified: Secondary | ICD-10-CM

## 2014-10-13 LAB — TSH: TSH: 1.58 u[IU]/mL (ref 0.35–4.50)

## 2014-10-13 MED ORDER — LINACLOTIDE 145 MCG PO CAPS: 145.0000 ug | ORAL_CAPSULE | Freq: Every day | ORAL | Status: DC

## 2014-10-13 NOTE — Progress Notes (Signed)
Pre visit review using our clinic review tool, if applicable. No additional management support is needed unless otherwise documented below in the visit note. 

## 2014-10-13 NOTE — Assessment & Plan Note (Signed)
New.  Start Linzess as pt is not having regular, complete evacuations on the Lactulose.  Coupon card given.  Discussed dietary and lifestyle modifications that will also assist w/ constipation.  Pt expressed understanding and is in agreement w/ plan.

## 2014-10-13 NOTE — Progress Notes (Signed)
   Subjective:    Patient ID: Martha Moss, female    DOB: 04/21/1966, 49 y.o.   MRN: [id number]  HPI Constipation- pt went to ER 2/23 after not having BM x2 weeks.  Pt reports this is chronic problem for her.  Was given lactulose but even this 'took a while to kick in'.  Taking daily but only have small BMs.  Pt reports abd was distended '5 inches'.  No hx of laxative abuse.  Pt was using Miralax daily.  Has not had recent thyroid level.   Review of Systems For ROS see HPI     Objective:   Physical Exam  Constitutional: She is oriented to person, place, and time. She appears well-developed and well-nourished. No distress.  HENT:  Head: Normocephalic and atraumatic.  Cardiovascular: Normal rate, regular rhythm, normal heart sounds and intact distal pulses.   Pulmonary/Chest: Effort normal and breath sounds normal. No respiratory distress. She has no wheezes. She has no rales.  Abdominal: Soft. Bowel sounds are normal. She exhibits no distension. There is no tenderness. There is no rebound and no guarding.  Neurological: She is alert and oriented to person, place, and time.  Skin: Skin is warm.  Psychiatric: She has a normal mood and affect. Her behavior is normal. Thought content normal.  Vitals reviewed.         Assessment & Plan:

## 2014-10-13 NOTE — Patient Instructions (Signed)
Follow up as needed We'll notify you of your lab results and make any changes if needed Start the Linzess daily Drink LOTS of water Call with any questions or concerns Hang in there!!!

## 2014-10-19 ENCOUNTER — Telehealth: Payer: Self-pay | Admitting: Family Medicine

## 2014-10-19 MED ORDER — LUBIPROSTONE 24 MCG PO CAPS: 24.0000 ug | ORAL_CAPSULE | Freq: Two times a day (BID) | ORAL | Status: DC

## 2014-10-19 MED ORDER — BUTALBITAL-APAP-CAFFEINE 50-325-40 MG PO TABS: ORAL_TABLET | ORAL | Status: DC

## 2014-10-19 NOTE — Telephone Encounter (Signed)
Please advise on the linzess.  Fiorinal last filled 04-2013 #20 with 0

## 2014-10-19 NOTE — Telephone Encounter (Signed)
Pt notified, advised that that is not really working. She states she will call back if she needs anything else. Will also call Patient assistance to see if they can fast track her claim.

## 2014-10-19 NOTE — Telephone Encounter (Signed)
Amitiza and Linzess are the only medications of their kind.  Otherwise, we are left w/ Lactulose and Miralax for bowel regularity.

## 2014-10-19 NOTE — Telephone Encounter (Signed)
Caller name: Laural BenesBlankenship, Munachimso L Relation to pt: self  Call back number: [phone] [facility] [place] [address] [phone])        Reason for call:   Pt states Linaclotide (LINZESS) 145 MCG CAPS capsule is to expensive approximately $300 and the discount card that was given to her by the office is for $75. Pt states medication is still to high requesting an alternate.   Pt is also requesting a refill butalbital-acetaminophen-caffeine (FIORICET, ESGIC) 50-325-40 MG per tablet

## 2014-10-19 NOTE — Telephone Encounter (Signed)
Caller name:Rawdon, Williette Relation to MW:UXLKpt:self Call back number:[phone] Pharmacy:[facility]  Reason for call: pt would like to know if there is any alternatives for rx lubiprostone (AMITIZA) 24 MCG capsule, pt states the rx was $360.00. Pt does not have insurance, states she has applied for patient assistance however it takes 4 to 6 wks after they receive the application. .Marland Kitchen

## 2014-10-19 NOTE — Telephone Encounter (Signed)
Meds filled and pt notified.  

## 2014-10-19 NOTE — Telephone Encounter (Signed)
Can switch from Linzess to Amitiza BID w/ food, #60, 3 refills Ok for fiorinal #30, no refills

## 2014-12-13 ENCOUNTER — Other Ambulatory Visit: Payer: Self-pay | Admitting: Family Medicine

## 2014-12-13 NOTE — Telephone Encounter (Signed)
Med filled.  

## 2014-12-22 ENCOUNTER — Emergency Department: Payer: Self-pay

## 2014-12-22 ENCOUNTER — Encounter: Payer: Self-pay | Admitting: Emergency Medicine

## 2014-12-22 ENCOUNTER — Emergency Department
Admission: EM | Admit: 2014-12-22 | Discharge: 2014-12-22 | Disposition: A | Discharge status: Home / Self Care | Payer: Self-pay | Attending: Emergency Medicine | Admitting: Emergency Medicine

## 2014-12-22 ENCOUNTER — Other Ambulatory Visit: Payer: Self-pay

## 2014-12-22 DIAGNOSIS — Z9049 Acquired absence of other specified parts of digestive tract: Secondary | ICD-10-CM | POA: Insufficient documentation

## 2014-12-22 DIAGNOSIS — K219 Gastro-esophageal reflux disease without esophagitis: Secondary | ICD-10-CM | POA: Insufficient documentation

## 2014-12-22 DIAGNOSIS — Z91040 Latex allergy status: Secondary | ICD-10-CM | POA: Insufficient documentation

## 2014-12-22 DIAGNOSIS — Z79899 Other long term (current) drug therapy: Secondary | ICD-10-CM | POA: Insufficient documentation

## 2014-12-22 DIAGNOSIS — R079 Chest pain, unspecified: Secondary | ICD-10-CM | POA: Insufficient documentation

## 2014-12-22 DIAGNOSIS — Z880 Allergy status to penicillin: Secondary | ICD-10-CM | POA: Insufficient documentation

## 2014-12-22 DIAGNOSIS — Z7951 Long term (current) use of inhaled steroids: Secondary | ICD-10-CM | POA: Insufficient documentation

## 2014-12-22 DIAGNOSIS — Z90710 Acquired absence of both cervix and uterus: Secondary | ICD-10-CM | POA: Insufficient documentation

## 2014-12-22 LAB — TROPONIN I: Troponin I: <0.03 ng/mL (ref <0.031)

## 2014-12-22 LAB — CBC
HCT: 37.4 % (ref 35.0–47.0)
HEMOGLOBIN: 12.5 g/dL (ref 12.0–16.0)
MCH: 31.1 pg (ref 26.0–34.0)
MCHC: 33.5 g/dL (ref 32.0–36.0)
MCV: 92.8 fL (ref 80.0–100.0)
Platelets: 299 10*3/uL (ref 150–440)
RBC: 4.03 MIL/uL (ref 3.80–5.20)
RDW: 13.5 % (ref 11.5–14.5)
WBC: 9.2 10*3/uL (ref 3.6–11.0)

## 2014-12-22 LAB — BASIC METABOLIC PANEL
ANION GAP: 8 (ref 5–15)
BUN: 22 mg/dL — ABNORMAL HIGH (ref 6–20)
CHLORIDE: 108 mmol/L (ref 101–111)
CO2: 23 mmol/L (ref 22–32)
CREATININE: 0.78 mg/dL (ref 0.44–1.00)
Calcium: 9.3 mg/dL (ref 8.9–10.3)
Glucose, Bld: 102 mg/dL — ABNORMAL HIGH (ref 65–99)
Potassium: 3.7 mmol/L (ref 3.5–5.1)
Sodium: 139 mmol/L (ref 135–145)

## 2014-12-22 MED ORDER — IOHEXOL 350 MG/ML SOLN
100.0000 mL | Freq: Once | INTRAVENOUS | Status: AC | PRN
  Administered 2014-12-22: 100 mL via INTRAVENOUS

## 2014-12-22 MED ORDER — GI COCKTAIL ~~LOC~~
30.0000 mL | Freq: Once | ORAL | Status: AC
  Administered 2014-12-22: 30 mL via ORAL

## 2014-12-22 MED ORDER — GI COCKTAIL ~~LOC~~
ORAL | Status: AC
  Filled 2014-12-22: qty 30

## 2014-12-22 NOTE — Discharge Instructions (Signed)
We discussed her exam and evaluation are reassuring and I suspect her symptoms are from acid reflux. I recommended a recheck of your heart enzyme in the emergency department, but you chose not to stay for this. I am recommending following up with your primary care doctor next week and also, follow up with a cardiologist for further evaluation nonspecific chest discomfort. Return to the emergency department for any new or worsening chest pain, shortness breath, trouble breathing, weakness, numbness, altered mental status, sweating, or passing out.  Chest Pain (Nonspecific) It is often hard to give a specific diagnosis for the cause of chest pain. There is always a chance that your pain could be related to something serious, such as a heart attack or a blood clot in the lungs. You need to follow up with your health care provider for further evaluation. CAUSES   Heartburn.  Pneumonia or bronchitis.  Anxiety or stress.  Inflammation around your heart (pericarditis) or lung (pleuritis or pleurisy).  A blood clot in the lung.  A collapsed lung (pneumothorax). It can develop suddenly on its own (spontaneous pneumothorax) or from trauma to the chest.  Shingles infection (herpes zoster virus). The chest wall is composed of bones, muscles, and cartilage. Any of these can be the source of the pain.  The bones can be bruised by injury.  The muscles or cartilage can be strained by coughing or overwork.  The cartilage can be affected by inflammation and become sore (costochondritis). DIAGNOSIS  Lab tests or other studies may be needed to find the cause of your pain. Your health care provider may have you take a test called an ambulatory electrocardiogram (ECG). An ECG records your heartbeat patterns over a 24-hour period. You may also have other tests, such as:  Transthoracic echocardiogram (TTE). During echocardiography, sound waves are used to evaluate how blood flows through your  heart.  Transesophageal echocardiogram (TEE).  Cardiac monitoring. This allows your health care provider to monitor your heart rate and rhythm in real time.  Holter monitor. This is a portable device that records your heartbeat and can help diagnose heart arrhythmias. It allows your health care provider to track your heart activity for several days, if needed.  Stress tests by exercise or by giving medicine that makes the heart beat faster. TREATMENT   Treatment depends on what may be causing your chest pain. Treatment may include:  Acid blockers for heartburn.  Anti-inflammatory medicine.  Pain medicine for inflammatory conditions.  Antibiotics if an infection is present.  You may be advised to change lifestyle habits. This includes stopping smoking and avoiding alcohol, caffeine, and chocolate.  You may be advised to keep your head raised (elevated) when sleeping. This reduces the chance of acid going backward from your stomach into your esophagus. Most of the time, nonspecific chest pain will improve within 2-3 days with rest and mild pain medicine.  HOME CARE INSTRUCTIONS   If antibiotics were prescribed, take them as directed. Finish them even if you start to feel better.  For the next few days, avoid physical activities that bring on chest pain. Continue physical activities as directed.  Do not use any tobacco products, including cigarettes, chewing tobacco, or electronic cigarettes.  Avoid drinking alcohol.  Only take medicine as directed by your health care provider.  Follow your health care provider's suggestions for further testing if your chest pain does not go away.  Keep any follow-up appointments you made. If you do not go to an appointment, you  could develop lasting (chronic) problems with pain. If there is any problem keeping an appointment, call to reschedule. SEEK MEDICAL CARE IF:   Your chest pain does not go away, even after treatment.  You have a rash  with blisters on your chest.  You have a fever. SEEK IMMEDIATE MEDICAL CARE IF:   You have increased chest pain or pain that spreads to your arm, neck, jaw, back, or abdomen.  You have shortness of breath.  You have an increasing cough, or you cough up blood.  You have severe back or abdominal pain.  You feel nauseous or vomit.  You have severe weakness.  You faint.  You have chills. This is an emergency. Do not wait to see if the pain will go away. Get medical help at once. Call your local emergency services (911 in [place] Do not drive yourself to the hospital. MAKE SURE YOU:   Understand these instructions.  Will watch your condition.  Will get help right away if you are not doing well or get worse. Document Released: 05/07/2005 Document Revised: 08/02/2013 Document Reviewed: 03/02/2008 [facility] Patient Information 2015 [place] [place]. This information is not intended to replace advice given to you by your health care provider. Make sure you discuss any questions you have with your health care provider.  Gastroesophageal Reflux Disease, Adult Gastroesophageal reflux disease (GERD) happens when acid from your stomach flows up into the esophagus. When acid comes in contact with the esophagus, the acid causes soreness (inflammation) in the esophagus. Over time, GERD may create small holes (ulcers) in the lining of the esophagus. CAUSES   Increased body weight. This puts pressure on the stomach, making acid rise from the stomach into the esophagus.  Smoking. This increases acid production in the stomach.  Drinking alcohol. This causes decreased pressure in the lower esophageal sphincter (valve or ring of muscle between the esophagus and stomach), allowing acid from the stomach into the esophagus.  Late evening meals and a full stomach. This increases pressure and acid production in the stomach.  A malformed lower esophageal sphincter. Sometimes, no cause is found. SYMPTOMS    Burning pain in the lower part of the mid-chest behind the breastbone and in the mid-stomach area. This may occur twice a week or more often.  Trouble swallowing.  Sore throat.  Dry cough.  Asthma-like symptoms including chest tightness, shortness of breath, or wheezing. DIAGNOSIS  Your caregiver may be able to diagnose GERD based on your symptoms. In some cases, X-rays and other tests may be done to check for complications or to check the condition of your stomach and esophagus. TREATMENT  Your caregiver may recommend over-the-counter or prescription medicines to help decrease acid production. Ask your caregiver before starting or adding any new medicines.  HOME CARE INSTRUCTIONS   Change the factors that you can control. Ask your caregiver for guidance concerning weight loss, quitting smoking, and alcohol consumption.  Avoid foods and drinks that make your symptoms worse, such as:  Caffeine or alcoholic drinks.  Chocolate.  Peppermint or mint flavorings.  Garlic and onions.  Spicy foods.  Citrus fruits, such as oranges, lemons, or limes.  Tomato-based foods such as sauce, chili, salsa, and pizza.  Fried and fatty foods.  Avoid lying down for the 3 hours prior to your bedtime or prior to taking a nap.  Eat small, frequent meals instead of large meals.  Wear loose-fitting clothing. Do not wear anything tight around your waist that causes pressure on your  stomach.  Raise the head of your bed 6 to 8 inches with wood blocks to help you sleep. Extra pillows will not help.  Only take over-the-counter or prescription medicines for pain, discomfort, or fever as directed by your caregiver.  Do not take aspirin, ibuprofen, or other nonsteroidal anti-inflammatory drugs (NSAIDs). SEEK IMMEDIATE MEDICAL CARE IF:   You have pain in your arms, neck, jaw, teeth, or back.  Your pain increases or changes in intensity or duration.  You develop nausea, vomiting, or sweating  (diaphoresis).  You develop shortness of breath, or you faint.  Your vomit is green, yellow, black, or looks like coffee grounds or blood.  Your stool is red, bloody, or black. These symptoms could be signs of other problems, such as heart disease, gastric bleeding, or esophageal bleeding. MAKE SURE YOU:   Understand these instructions.  Will watch your condition.  Will get help right away if you are not doing well or get worse. Document Released: 05/07/2005 Document Revised: 10/20/2011 Document Reviewed: 02/14/2011 [facility] Patient Information 2015 [place], [place]. This information is not intended to replace advice given to you by your health care provider. Make sure you discuss any questions you have with your health care provider.

## 2014-12-22 NOTE — ED Notes (Signed)
Lord, MD at bedside. 

## 2014-12-22 NOTE — ED Provider Notes (Signed)
[facility] Emergency Department Provider Note   ____________________________________________  Time seen: 8:45 PM I have reviewed the triage vital signs and the triage nursing note.  HISTORY  Chief Complaint Chest Pain   Historian Patient and husband  HPI FABIHA ROUGEAU is a 49 y.o. female who had acute onset of lower chest/epigastric pain which is sharp in nature about 30 minutes prior to arrival as she was riding in the car. No abnormal food. She does have some what shortness of breath due to the pain but is not pleuritic chest pain. No recent illness. No fever. Mild nausea no vomiting. No lower abdominal pain constipation or diarrhea. No sore symptoms previously   Past Medical History  Diagnosis Date  . Fibromyalgia   . Depression   . Migraine   . GERD (gastroesophageal reflux disease)     Patient Active Problem List   Diagnosis Date Noted  . Chronic idiopathic constipation 10/13/2014  . Contact dermatitis due to jewelry 02/19/2014  . Acute sinusitis 11/19/2013  . Insomnia 06/22/2013  . Pain in right wrist 06/22/2013  . Soft tissue mass 06/22/2013  . Plantar fasciitis of left foot 03/14/2013  . Shaky 02/22/2013  . Viral pharyngitis 04/21/2011  . DEPRESSION 10/28/2010  . MIGRAINE, COMMON 10/28/2010  . GERD 10/28/2010  . CHEST PAIN, ATYPICAL, HX OF 03/27/2010  . PHARYNGITIS 12/26/2009  . ALLERGIC RHINITIS, SEASONAL 12/07/2009  . UTI 10/26/2009  . FIBROMYALGIA 10/26/2009  . POLYARTHRITIS 10/19/2009  . SINUSITIS- ACUTE-NOS 05/02/2009    Past Surgical History  Procedure Laterality Date  . Abdominal hysterectomy    . Ovarian cyst surgery    . Cholecystectomy      Current Outpatient Rx  Name  Route  Sig  Dispense  Refill  . buPROPion (WELLBUTRIN XL) 300 MG 24 hr tablet      TAKE 1 TABLET DAILY   90 tablet   2   . butalbital-acetaminophen-caffeine (FIORICET, ESGIC) 50-325-40 MG per tablet      TAKE 1-2 TABLETS BY MOUTH EVERY  6 HOURS AS NEEDED FOR HEADACHE   30 tablet   0   . EPINEPHrine (EPIPEN) 0.3 mg/0.3 mL SOAJ injection   Intramuscular   Inject 0.3 mLs (0.3 mg total) into the muscle once.   1 Device   3   . fluticasone (FLONASE) 50 MCG/ACT nasal spray   Each Nare   Place 2 sprays into both nostrils daily.   16 g   6   . lactulose (CHRONULAC) 10 GM/15ML solution   Oral   Take 30 mLs (20 g total) by mouth daily as needed for moderate constipation or severe constipation.   240 mL   0   . Linaclotide (LINZESS) 145 MCG CAPS capsule   Oral   Take 1 capsule (145 mcg total) by mouth daily.   30 capsule   3   . lubiprostone (AMITIZA) 24 MCG capsule   Oral   Take 1 capsule (24 mcg total) by mouth 2 (two) times daily with a meal.   60 capsule   3   . SAVELLA 100 MG TABS tablet      TAKE 1/2 TABLET BY MOUTH TWICE DAILY   30 each   3   . topiramate (TOPAMAX) 200 MG tablet   Oral   Take 200 mg by mouth 2 (two) times daily.          . traZODone (DESYREL) 50 MG tablet      TAKE 1/2 TO 1 TABLET  [facility] NEEDED FOR [facility].   90 tablet   1     Allergies Bee venom; Azithromycin; Diphenhydramine hcl; Erythromycin; Latex; Penicillins; Sulfonamide derivatives; and Tetracyclines & related  No family history on file.  Social History History  Substance Use Topics  . Smoking status: Never Smoker   . Smokeless tobacco: Never Used  . Alcohol Use: Yes     Comment: rarely    Review of Systems  Constitutional: Negative for fever. Eyes: Negative for visual changes. ENT: Negative for sore throat. Cardiovascular: No irregular heartbeat Respiratory: Negative for cough Gastrointestinal: Negative for abdominal pain, vomiting and diarrhea. Genitourinary: Negative for dysuria. Musculoskeletal: Negative for back pain. Skin: Negative for rash. Neurological: Negative for headaches, focal weakness or numbness.  ____________________________________________   PHYSICAL EXAM:  VITAL  SIGNS: ED Triage Vitals  Enc Vitals Group     BP 12/22/14 2009 134/86 mmHg     Pulse Rate 12/22/14 2009 63     Resp 12/22/14 2009 22     Temp 12/22/14 2009 97.8 F (36.6 C)     Temp Source 12/22/14 2009 Oral     SpO2 12/22/14 2009 100 %     Weight 12/22/14 2009 125 lb (56.7 kg)     Height 12/22/14 2009 5\' 2"  (1.575 m)     Head Cir --      Peak Flow --      Pain Score 12/22/14 2010 9     Pain Loc --      Pain Edu? --      Excl. in GC? --      Constitutional: Alert and oriented. Well appearing and in no distress. Eyes: Conjunctivae are normal. PERRL. Normal extraocular movements. ENT   Head: Normocephalic and atraumatic.   Nose: No congestion/rhinnorhea.   Mouth/Throat: Mucous membranes are moist.   Neck: No stridor. Cardiovascular: Normal rate, regular rhythm.  No murmurs, rubs, or gallops. Respiratory: Normal respiratory effort without tachypnea nor retractions. Breath sounds are clear and equal bilaterally. No wheezes/rales/rhonchi. Gastrointestinal: Soft abdomen moderately tender in the epigastrium. Genitourinary: Musculoskeletal: Nontender with normal range of motion in all extremities. No joint effusions.  No lower extremity tenderness nor edema. Neurologic:  Normal speech and language. No gross focal neurologic deficits are appreciated. Speech is normal.  Skin:  Skin is warm, dry and intact. No rash noted. Psychiatric: Mood and affect are normal. Speech and behavior are normal. Patient exhibits appropriate insight and judgment.  ____________________________________________   EKG  Normal sinus rhythm 81 bpm. Narrow QRS. Normal axis. Nonspecific T wave. ____________________________________________  LABS (pertinent positives/negatives) White blood cell count normal 9.2 with a hemoglobin of 12.5 Panel within normal limits Troponin negative   ____________________________________________  RADIOLOGY Radiologist results reviewed  Chest portable:  Negative CT anterior chest and pelvis: Negative for acute cause pain __________________________________________  PROCEDURES  Procedure(s) performed:  Critical Care performed:   ____________________________________________   ED COURSE / ASSESSMENT AND PLAN  Pertinent labs & imaging results that were available during my care of the patient were reviewed by me and considered in my medical decision making (see chart for details).  Clinically symptoms seemed consistent with gastritis/GERD. GI cocktail was tried for symptom control. Symptoms atypical for ACS/cardiac etiology with a nonspecific/reassuring EKG and negative troponin.   Patient's symptoms improved with a GI cocktail. She still had some artery discomfort and I recommended ruling out aortic emergency. I also recommended repeat troponin at 4 hours her the patient does not want to stay for  this. She understands the risk of possibly missing a mild cardial infarction, death and disability. She will receive be referred to outpatient cardial to follow-up and to follow-up with her primary care doctor. She understands return depressions and discharge and judgment. ___________________________________________   FINAL CLINICAL IMPRESSION(S) / ED DIAGNOSES  Acute GERD Atypical chest pain, nonspecific chest pain    Governor Rooksebecca Kayelee Herbig, MD 12/22/14 2312

## 2014-12-22 NOTE — ED Notes (Signed)
Patient here with epigastric pain that started about 10 minutes prior to arrival. Patient with complaint of shortness of breath, nausea and diaphoretic.

## 2014-12-22 NOTE — ED Notes (Signed)
Patient transported to CT 

## 2015-01-18 ENCOUNTER — Telehealth: Payer: Self-pay | Admitting: Family Medicine

## 2015-01-18 MED ORDER — TOPIRAMATE 200 MG PO TABS: 200.0000 mg | ORAL_TABLET | Freq: Two times a day (BID) | ORAL | Status: DC

## 2015-01-18 NOTE — Telephone Encounter (Signed)
Med filled today, will notify pt.

## 2015-01-18 NOTE — Telephone Encounter (Signed)
Ok to refill 

## 2015-01-18 NOTE — Telephone Encounter (Signed)
Ok to fill 

## 2015-01-18 NOTE — Telephone Encounter (Signed)
Caller name: Mannie Glynn Relationship to patient: husband Can be reached: [phone] Pharmacy: [facility]  Reason for call: Pt had 2 neurologists that retired. She can't get in with the new one for a while. Pt is needing refill on topiramate (TOPAMAX) 200 MG tablet. She is taking 2/day. She is out of medication and has been for a week. He said they talked to [place] about this and she said she could provide refills until they get in with neuro.

## 2015-02-16 ENCOUNTER — Other Ambulatory Visit: Payer: Self-pay | Admitting: Family Medicine

## 2015-02-16 NOTE — Telephone Encounter (Signed)
Please advise last OV was 10/23/14 for constipation, pt has not had a CPE in over a year. No upcoming appts   Wellbutrin last filled 04-24-14 #90 with 2

## 2015-02-18 NOTE — Telephone Encounter (Signed)
Ok for #90, no reills.  Needs to schedule CPE

## 2015-02-19 NOTE — Telephone Encounter (Signed)
Med filled.  

## 2015-02-27 ENCOUNTER — Encounter: Payer: Self-pay | Admitting: Nurse Practitioner

## 2015-02-27 ENCOUNTER — Ambulatory Visit (INDEPENDENT_AMBULATORY_CARE_PROVIDER_SITE_OTHER): Payer: Self-pay | Admitting: Nurse Practitioner

## 2015-02-27 VITALS — BP 122/81 | HR 112 | Temp 99.8°F | Resp 16 | Ht 61.0 in | Wt 127.0 lb

## 2015-02-27 DIAGNOSIS — R3915 Urgency of urination: Secondary | ICD-10-CM

## 2015-02-27 DIAGNOSIS — R300 Dysuria: Secondary | ICD-10-CM

## 2015-02-27 DIAGNOSIS — M5442 Lumbago with sciatica, left side: Secondary | ICD-10-CM

## 2015-02-27 DIAGNOSIS — M5441 Lumbago with sciatica, right side: Secondary | ICD-10-CM

## 2015-02-27 LAB — CBC WITH DIFFERENTIAL/PLATELET
BASOS PCT: 0.5 % (ref 0.0–3.0)
Basophils Absolute: 0 10*3/uL (ref 0.0–0.1)
Eosinophils Absolute: 0.1 10*3/uL (ref 0.0–0.7)
Eosinophils Relative: 2.1 % (ref 0.0–5.0)
HEMATOCRIT: 39.7 % (ref 36.0–46.0)
Hemoglobin: 13.5 g/dL (ref 12.0–15.0)
LYMPHS PCT: 42.5 % (ref 12.0–46.0)
Lymphs Abs: 2.9 10*3/uL (ref 0.7–4.0)
MCHC: 34 g/dL (ref 30.0–36.0)
MCV: 91.8 fl (ref 78.0–100.0)
MONO ABS: 0.5 10*3/uL (ref 0.1–1.0)
MONOS PCT: 7.1 % (ref 3.0–12.0)
Neutro Abs: 3.3 10*3/uL (ref 1.4–7.7)
Neutrophils Relative %: 47.8 % (ref 43.0–77.0)
PLATELETS: 288 10*3/uL (ref 150.0–400.0)
RBC: 4.33 Mil/uL (ref 3.87–5.11)
RDW: 13.1 % (ref 11.5–15.5)
WBC: 6.8 10*3/uL (ref 4.0–10.5)

## 2015-02-27 LAB — COMPREHENSIVE METABOLIC PANEL
ALT: 16 U/L (ref 0–35)
AST: 15 U/L (ref 0–37)
Albumin: 4.6 g/dL (ref 3.5–5.2)
Alkaline Phosphatase: 75 U/L (ref 39–117)
BILIRUBIN TOTAL: 0.2 mg/dL (ref 0.2–1.2)
BUN: 22 mg/dL (ref 6–23)
CALCIUM: 9.5 mg/dL (ref 8.4–10.5)
CO2: 25 mEq/L (ref 19–32)
Chloride: 109 mEq/L (ref 96–112)
Creatinine, Ser: 0.79 mg/dL (ref 0.40–1.20)
GFR: 82.12 mL/min (ref >60.00)
Glucose, Bld: 93 mg/dL (ref 70–99)
Potassium: 4.4 mEq/L (ref 3.5–5.1)
Sodium: 141 mEq/L (ref 135–145)
Total Protein: 7.3 g/dL (ref 6.0–8.3)

## 2015-02-27 MED ORDER — CIPROFLOXACIN HCL 500 MG PO TABS: 500.0000 mg | ORAL_TABLET | Freq: Two times a day (BID) | ORAL | Status: DC

## 2015-02-27 MED ORDER — KETOROLAC TROMETHAMINE 30 MG/ML IJ SOLN
30.0000 mg | Freq: Once | INTRAMUSCULAR | Status: AC
  Administered 2015-02-27: 30 mg via INTRAMUSCULAR

## 2015-02-27 NOTE — Patient Instructions (Signed)
I suspect you have pyelonephritis-infection in kidneys.  Start antibiotic. Sip fluids every hour to flush kidneys.  Do not take any more ibuprophen today. You may take 1000 mg tylenol 3 times daily. You may take hydrocodone as directed. Tomorrow, you may take 600 mg ibuprophen twice daily with tylenol as stated above. Take with food. Continue prilosec.  Please follow up on Friday with your PCP. My office will call with lab results.

## 2015-02-27 NOTE — Progress Notes (Signed)
Pre visit review using our clinic review tool, if applicable. No additional management support is needed unless otherwise documented below in the visit note. 

## 2015-02-27 NOTE — Progress Notes (Signed)
Subjective:     Martha BenesSherry L Moss is a 49 y.o. female presents c/o low grade fever, bilat lower back pain that radiates into both legs, nausea, dysuria and urgency. Symptoms started 1 weeks ago & started w/R sided abdominal pain that spread to back & legs over several days. She has been taking cystix & AZO for dysuria & urgency. She gives hx of IC. Nausea started yesterday, no vomiting. Fever at home has been around 100 for several days, 100.3 with forehead thermometer today. She is taking 400 mg advil & 1200 mg tylenol q4h with minimal relief. She took hydrocodone "left over from surgery" on 2 occasions w/minimal relief. She has chronic constipation-last BM was this am-small, Last larger BM was 3 days ago. She has frequent indigestion relieved by prilosec.  The following portions of the patient's history were reviewed and updated as appropriate: allergies, current medications, past family history, past medical history, past social history, past surgical history and problem list.  Review of Systems Pertinent items are noted in HPI.    Objective:    BP 122/81 mmHg  Pulse 112  Temp(Src) 99.8 F (37.7 C) (Temporal)  Resp 16  Ht 5\' 1"  (1.549 m)  Wt 127 lb (57.607 kg)  BMI 24.01 kg/m2  SpO2 99% General appearance: alert, cooperative, appears stated age and mild distress Eyes: negative findings: lids and lashes normal and conjunctivae and sclerae normal Back: range of motion normal, symetric, midline & paraspinal tenderness lumbar & SIJ, tender over greater trocanter, bilat. bilat CVA tenderness Abdomen: normal findings: no masses palpable and no organomegaly and abnormal findings:  RUQ tenderness Neurologic: Gait: Antalgic    Assessment:Plan     1. Bilateral low back pain with sciatica, sciatica laterality unspecified DD: pyelonephritis, lumbar radiculopathy, kidney stone - CBC with Differential/Platelet - Comprehensive metabolic panel - Urine culture - POCT urinalysis dipstick -  ketorolac (TORADOL) 30 MG/ML injection 30 mg; Inject 1 mL (30 mg total) into the muscle once.  2. Dysuria - CBC with Differential/Platelet - Comprehensive metabolic panel - Urine culture - POCT urinalysis dipstick-SG 1.025 - ciprofloxacin (CIPRO) 500 MG tablet; Take 1 tablet (500 mg total) by mouth 2 (two) times daily.  Dispense: 10 tablet; Refill: 0  3. Urgency of urination - CBC with Differential/Platelet - Comprehensive metabolic panel - Urine culture - POCT urinalysis dipstick - ciprofloxacin (CIPRO) 500 MG tablet; Take 1 tablet (500 mg total) by mouth 2 (two) times daily.  Dispense: 10 tablet; Refill: 0  See pt instructions F/u 3 days

## 2015-02-28 ENCOUNTER — Telehealth: Payer: Self-pay | Admitting: Family Medicine

## 2015-02-28 NOTE — Telephone Encounter (Signed)
Informed patient of this and appointment scheduled °

## 2015-02-28 NOTE — Telephone Encounter (Signed)
Caller name: Rekita Relation to pt: self Call back number: [phone] Pharmacy:  Reason for call:   Saw Martha Moss yesterday. States that she was told to schedule an appointment with Dr. Beverely Lowabori on Friday. Is wanting to know if she needs to be seen today or Friday with Dr. Beverely Lowabori. Refuses to see anyone else.

## 2015-02-28 NOTE — Telephone Encounter (Signed)
Pt was told to follow up in 3 days, by that time culture will be back. Ok for appt on Friday.

## 2015-03-01 ENCOUNTER — Encounter (HOSPITAL_COMMUNITY): Payer: Self-pay | Admitting: *Deleted

## 2015-03-01 ENCOUNTER — Emergency Department (HOSPITAL_COMMUNITY): Payer: Self-pay

## 2015-03-01 ENCOUNTER — Emergency Department (HOSPITAL_COMMUNITY)
Admission: EM | Admit: 2015-03-01 | Discharge: 2015-03-01 | Disposition: A | Discharge status: Home / Self Care | Payer: Self-pay | Attending: Emergency Medicine | Admitting: Emergency Medicine

## 2015-03-01 ENCOUNTER — Telehealth: Payer: Self-pay | Admitting: Nurse Practitioner

## 2015-03-01 DIAGNOSIS — R509 Fever, unspecified: Secondary | ICD-10-CM | POA: Insufficient documentation

## 2015-03-01 DIAGNOSIS — Z91040 Latex allergy status: Secondary | ICD-10-CM | POA: Insufficient documentation

## 2015-03-01 DIAGNOSIS — K219 Gastro-esophageal reflux disease without esophagitis: Secondary | ICD-10-CM | POA: Insufficient documentation

## 2015-03-01 DIAGNOSIS — Z79899 Other long term (current) drug therapy: Secondary | ICD-10-CM | POA: Insufficient documentation

## 2015-03-01 DIAGNOSIS — R52 Pain, unspecified: Secondary | ICD-10-CM

## 2015-03-01 DIAGNOSIS — M545 Low back pain, unspecified: Secondary | ICD-10-CM

## 2015-03-01 DIAGNOSIS — G43909 Migraine, unspecified, not intractable, without status migrainosus: Secondary | ICD-10-CM | POA: Insufficient documentation

## 2015-03-01 DIAGNOSIS — F329 Major depressive disorder, single episode, unspecified: Secondary | ICD-10-CM | POA: Insufficient documentation

## 2015-03-01 DIAGNOSIS — R109 Unspecified abdominal pain: Secondary | ICD-10-CM | POA: Insufficient documentation

## 2015-03-01 DIAGNOSIS — Z880 Allergy status to penicillin: Secondary | ICD-10-CM | POA: Insufficient documentation

## 2015-03-01 DIAGNOSIS — M797 Fibromyalgia: Secondary | ICD-10-CM | POA: Insufficient documentation

## 2015-03-01 DIAGNOSIS — Z792 Long term (current) use of antibiotics: Secondary | ICD-10-CM | POA: Insufficient documentation

## 2015-03-01 LAB — URINALYSIS, ROUTINE W REFLEX MICROSCOPIC
BILIRUBIN URINE: NEGATIVE
GLUCOSE, UA: NEGATIVE mg/dL
HGB URINE DIPSTICK: NEGATIVE
KETONES UR: NEGATIVE mg/dL
LEUKOCYTES UA: NEGATIVE
Nitrite: NEGATIVE
PH: 5 (ref 5.0–8.0)
Protein, ur: NEGATIVE mg/dL
SPECIFIC GRAVITY, URINE: 1.018 (ref 1.005–1.030)
UROBILINOGEN UA: 0.2 mg/dL (ref 0.0–1.0)

## 2015-03-01 LAB — URINE CULTURE
COLONY COUNT: NO GROWTH
Organism ID, Bacteria: NO GROWTH

## 2015-03-01 LAB — CBC
HCT: 39.3 % (ref 36.0–46.0)
Hemoglobin: 13.3 g/dL (ref 12.0–15.0)
MCH: 31.1 pg (ref 26.0–34.0)
MCHC: 33.8 g/dL (ref 30.0–36.0)
MCV: 91.8 fL (ref 78.0–100.0)
Platelets: 281 10*3/uL (ref 150–400)
RBC: 4.28 MIL/uL (ref 3.87–5.11)
RDW: 13 % (ref 11.5–15.5)
WBC: 6.3 10*3/uL (ref 4.0–10.5)

## 2015-03-01 LAB — COMPREHENSIVE METABOLIC PANEL
ALT: 24 U/L (ref 14–54)
ANION GAP: 8 (ref 5–15)
AST: 28 U/L (ref 15–41)
Albumin: 4.3 g/dL (ref 3.5–5.0)
Alkaline Phosphatase: 77 U/L (ref 38–126)
BILIRUBIN TOTAL: 0.2 mg/dL — AB (ref 0.3–1.2)
BUN: 16 mg/dL (ref 6–20)
CHLORIDE: 105 mmol/L (ref 101–111)
CO2: 24 mmol/L (ref 22–32)
CREATININE: 0.81 mg/dL (ref 0.44–1.00)
Calcium: 9.3 mg/dL (ref 8.9–10.3)
GFR calc Af Amer: >60 mL/min (ref >60)
GFR calc non Af Amer: >60 mL/min (ref >60)
Glucose, Bld: 101 mg/dL — ABNORMAL HIGH (ref 65–99)
POTASSIUM: 3.6 mmol/L (ref 3.5–5.1)
SODIUM: 137 mmol/L (ref 135–145)
Total Protein: 7.4 g/dL (ref 6.5–8.1)

## 2015-03-01 MED ORDER — HYDROCODONE-ACETAMINOPHEN 5-325 MG PO TABS: 1.0000 | ORAL_TABLET | ORAL | Status: DC | PRN

## 2015-03-01 MED ORDER — GADOBENATE DIMEGLUMINE 529 MG/ML IV SOLN
10.0000 mL | Freq: Once | INTRAVENOUS | Status: AC | PRN
Start: 2015-03-01 — End: 2015-03-01
  Administered 2015-03-01: 10 mL via INTRAVENOUS

## 2015-03-01 NOTE — ED Notes (Signed)
MD at bedside. 

## 2015-03-01 NOTE — Telephone Encounter (Signed)
Pt states she is still running fever and back pain.  Please advise.

## 2015-03-01 NOTE — ED Provider Notes (Signed)
CSN: [id number]     Arrival date & time 03/01/15  1546 History   First MD Initiated Contact with Patient 03/01/15 1718     Chief Complaint  Patient presents with  . Back Pain  . Fever     (Consider location/radiation/quality/duration/timing/severity/associated sxs/prior Treatment) Patient is a 49 y.o. female presenting with back pain and fever. The history is provided by the patient.  Back Pain Associated symptoms: abdominal pain and fever   Associated symptoms: no dysuria and no numbness   Fever Associated symptoms: no cough, no dysuria, no ear pain and no rash    patient has had back pain and some fevers for the last week. Has been seen by her PCP and had a negative urine culture. Has been on antilordotic's anyway. Patient started with right upper quadrant pain is now in the back and low back. Radiates to both legs. He was up to 101 at home. Sent in for MRI to rule out discitis. Patient does have history of interstitial cystitis. Urinary symptoms are not unusual for her. Also struggles some chronic constipation. Mild nausea without vomiting. Has had some constipation due to the pain meds but has been taking some laxatives also. No cough.  Past Medical History  Diagnosis Date  . Fibromyalgia   . Depression   . Migraine   . GERD (gastroesophageal reflux disease)    Past Surgical History  Procedure Laterality Date  . Abdominal hysterectomy    . Ovarian cyst surgery    . Cholecystectomy     Family History  Problem Relation Age of Onset  . Kidney Stones Daughter    History  Substance Use Topics  . Smoking status: Never Smoker   . Smokeless tobacco: Never Used  . Alcohol Use: Yes     Comment: rarely   OB History    No data available     Review of Systems  Constitutional: Positive for fever.  HENT: Negative for ear pain.   Respiratory: Negative for cough and shortness of breath.   Gastrointestinal: Positive for abdominal pain.  Genitourinary: Negative for dysuria.   Musculoskeletal: Positive for back pain.  Skin: Negative for rash.  Neurological: Negative for numbness.  Hematological: Negative for adenopathy.      Allergies  Bee venom; Azithromycin; Diphenhydramine hcl; Erythromycin; Latex; Morphine and related; Penicillins; Sulfonamide derivatives; and Tetracyclines & related  Home Medications   Prior to Admission medications   Medication Sig Start Date End Date Taking? Authorizing Provider  butalbital-acetaminophen-caffeine (FIORICET, ESGIC) 50-325-40 MG per tablet TAKE 1-2 TABLETS BY MOUTH EVERY 6 HOURS AS NEEDED FOR HEADACHE 10/19/14  Yes Sheliah Hatch, MD  ciprofloxacin (CIPRO) 500 MG tablet Take 1 tablet (500 mg total) by mouth 2 (two) times daily. 02/27/15  Yes Kelle Darting, NP  EPINEPHrine (EPIPEN) 0.3 mg/0.3 mL SOAJ injection Inject 0.3 mLs (0.3 mg total) into the muscle once. 03/14/13  Yes Sheliah Hatch, MD  magnesium 30 MG tablet Take 30 mg by mouth every other day.   Yes Historical Provider, MD  Multiple Vitamin (MULTIVITAMIN WITH MINERALS) TABS tablet Take 1 tablet by mouth daily.   Yes Historical Provider, MD  SAVELLA 100 MG TABS tablet TAKE 1/2 TABLET BY MOUTH TWICE DAILY 12/13/14  Yes Sheliah Hatch, MD  topiramate (TOPAMAX) 200 MG tablet Take 1 tablet (200 mg total) by mouth 2 (two) times daily. 01/18/15  Yes Sheliah Hatch, MD  traZODone (DESYREL) 50 MG tablet TAKE 1/2 TO 1 TABLET BY MOUTH AT [facility]  NEEDED FOR SLEEP. 12/13/14  Yes Sheliah Hatch, MD  buPROPion (WELLBUTRIN XL) 300 MG 24 hr tablet TAKE 1 TABLET BY MOUTH DAILY. Patient not taking: Reported on 02/27/2015 02/18/15   Sheliah Hatch, MD  HYDROcodone-acetaminophen (NORCO/VICODIN) 5-325 MG per tablet Take 1-2 tablets by mouth every 4 (four) hours as needed. 03/01/15   Benjiman Core, MD  Linaclotide [facility]) 145 MCG CAPS capsule Take 1 capsule (145 mcg total) by mouth daily. Patient not taking: Reported on 12/22/2014 10/13/14   Sheliah Hatch, MD   lubiprostone (AMITIZA) 24 MCG capsule Take 1 capsule (24 mcg total) by mouth 2 (two) times daily with a meal. Patient not taking: Reported on 12/22/2014 10/19/14   Sheliah Hatch, MD   BP 101/59 mmHg  Pulse 87  Temp(Src) 98.6 F (37 C) (Oral)  Resp 20  SpO2 100% Physical Exam  Constitutional: She appears well-developed and well-nourished.  HENT:  Head: Atraumatic.  Eyes: Pupils are equal, round, and reactive to light.  Neck: Neck supple.  Cardiovascular: Normal rate and regular rhythm.   Pulmonary/Chest: Effort normal.  Abdominal: Soft. There is no tenderness.  Musculoskeletal: Normal range of motion. She exhibits tenderness.  Tenderness over lower lumbar spine without rash. Tenderness around L5-S1 area.  Neurological: She is alert.  Skin: Skin is warm.    ED Course  Procedures (including critical care time) Labs Review Labs Reviewed  COMPREHENSIVE METABOLIC PANEL - Abnormal; Notable for the following:    Glucose, Bld 101 (*)    Total Bilirubin 0.2 (*)    All other components within normal limits  CULTURE, BLOOD (ROUTINE X 2)  CULTURE, BLOOD (ROUTINE X 2)  URINALYSIS, ROUTINE W REFLEX MICROSCOPIC (NOT AT [facility])  CBC    Imaging Review Dg Chest 2 View  03/01/2015   CLINICAL DATA:  Shortness of breath, fever  EXAM: CHEST  2 VIEW  COMPARISON:  12/22/2014  FINDINGS: Cardiomediastinal silhouette is stable. Mild lower thoracic levoscoliosis. No acute infiltrate or pleural effusion. No pulmonary edema. Mild degenerative changes lower thoracic spine.  IMPRESSION: No active cardiopulmonary disease.   Electronically Signed   By: Natasha Mead M.D.   On: 03/01/2015 18:20   Mr Lumbar Spine W Wo Contrast  03/01/2015   CLINICAL DATA:  Back pain with fever.  EXAM: MRI LUMBAR SPINE WITHOUT AND WITH CONTRAST  TECHNIQUE: Multiplanar and multiecho pulse sequences of the lumbar spine were obtained without and with intravenous contrast.  CONTRAST:  10mL MULTIHANCE GADOBENATE DIMEGLUMINE 529  MG/ML IV SOLN  COMPARISON:  None.  FINDINGS: Image quality degraded by mild motion  Normal alignment. Negative for fracture or mass. Hemangioma L3 on the left.  No evidence of spinal infection. Disc spaces are maintained without disc space narrowing. No bone marrow edema. Normal enhancement following contrast infusion  Negative for disc protrusion or spinal stenosis. No significant degenerative changes in the lumbar spine.  IMPRESSION: Negative   Electronically Signed   By: Marlan Palau M.D.   On: 03/01/2015 19:25     EKG Interpretation None      MDM   Final diagnoses:  Pain  Fever  Right-sided low back pain without sciatica    Patient with fever and back pain. Tenderness over spine. Worry for discitis or epidural abscess. MRI reassuring. Lab work otherwise reassuring also. Blood culture sent along with chest x-ray due to some right upper abdominal pain. Will discharge home.    Benjiman Core, MD 03/01/15 [phone]

## 2015-03-01 NOTE — ED Notes (Addendum)
Pt reports having lower back pain that radiates down into hips and legs x 1 week. Also having fever, was started on cipro with no relief but sent here by pcp for MRI. Pt took pain meds pta.

## 2015-03-01 NOTE — Telephone Encounter (Signed)
LMOM for pt to CB.  

## 2015-03-01 NOTE — Telephone Encounter (Signed)
Spoke w/pt, fever is higher today-101.2 oral. Back pain same-difficult to walk. Some diarrhea. Urine culture no growth, no blood in urine, unlikely that fever is of GU origin. Concern for diskitis: fever, midline & paraspinal tenderness on exam.  She needs MRI spine for further w/u, but has no insurance & unable to pay OOP. Advised to got to ER for further work up. She will need parenteral ABX if has diskitis. I expressed potential urgency for further evaluation.  She agreed to go to ER within 24 hours, but has to get someone to drive her.

## 2015-03-01 NOTE — ED Notes (Signed)
Patient transported to X-ray 

## 2015-03-01 NOTE — Discharge Instructions (Signed)
Pain of Unknown Etiology (Pain Without a Known Cause) You have come to your caregiver because of pain. Pain can occur in any part of the body. Often there is not a definite cause. If your laboratory (blood or urine) work was normal and X-rays or other studies were normal, your caregiver may treat you without knowing the cause of the pain. An example of this is the headache. Most headaches are diagnosed by taking a history. This means your caregiver asks you questions about your headaches. Your caregiver determines a treatment based on your answers. Usually testing done for headaches is normal. Often testing is not done unless there is no response to medications. Regardless of where your pain is located today, you can be given medications to make you comfortable. If no physical cause of pain can be found, most cases of pain will gradually leave as suddenly as they came.  If you have a painful condition and no reason can be found for the pain, it is important that you follow up with your caregiver. If the pain becomes worse or does not go away, it may be necessary to repeat tests and look further for a possible cause.  Only take over-the-counter or prescription medicines for pain, discomfort, or fever as directed by your caregiver.  For the protection of your privacy, test results cannot be given over the phone. Make sure you receive the results of your test. Ask how these results are to be obtained if you have not been informed. It is your responsibility to obtain your test results.  You may continue all activities unless the activities cause more pain. When the pain lessens, it is important to gradually resume normal activities. Resume activities by beginning slowly and gradually increasing the intensity and duration of the activities or exercise. During periods of severe pain, bed rest may be helpful. Lie or sit in any position that is comfortable.  Ice used for acute (sudden) conditions may be effective.  Use a large plastic bag filled with ice and wrapped in a towel. This may provide pain relief.  See your caregiver for continued problems. Your caregiver can help or refer you for exercises or physical therapy if necessary. If you were given medications for your condition, do not drive, operate machinery or power tools, or sign legal documents for 24 hours. Do not drink alcohol, take sleeping pills, or take other medications that may interfere with treatment. See your caregiver immediately if you have pain that is becoming worse and not relieved by medications. Document Released: 04/22/2001 Document Revised: 05/18/2013 Document Reviewed: 07/28/2005 [facility] Patient Information 2015 [place], [place]. This information is not intended to replace advice given to you by your health care provider. Make sure you discuss any questions you have with your health care provider.  Fever, Adult A fever is a higher than normal body temperature. In an adult, an oral temperature around 98.6 F (37 C) is considered normal. A temperature of 100.4 F (38 C) or higher is generally considered a fever. Mild or moderate fevers generally have no long-term effects and often do not require treatment. Extreme fever (greater than or equal to 106 F or 41.1 C) can cause seizures. The sweating that may occur with repeated or prolonged fever may cause dehydration. Elderly people can develop confusion during a fever. A measured temperature can vary with:  Age.  Time of day.  Method of measurement (mouth, underarm, rectal, or ear). The fever is confirmed by taking a temperature with a thermometer.  Temperatures can be taken different ways. Some methods are accurate and some are not.  An oral temperature is used most commonly. Electronic thermometers are fast and accurate.  An ear temperature will only be accurate if the thermometer is positioned as recommended by the manufacturer.  A rectal temperature is accurate and done for  those adults who have a condition where an oral temperature cannot be taken.  An underarm (axillary) temperature is not accurate and not recommended. Fever is a symptom, not a disease.  CAUSES   Infections commonly cause fever.  Some noninfectious causes for fever include:  Some arthritis conditions.  Some thyroid or adrenal gland conditions.  Some immune system conditions.  Some types of cancer.  A medicine reaction.  High doses of certain street drugs such as methamphetamine.  Dehydration.  Exposure to high outside or room temperatures.  Occasionally, the source of a fever cannot be determined. This is sometimes called a "fever of unknown origin" (FUO).  Some situations may lead to a temporary rise in body temperature that may go away on its own. Examples are:  Childbirth.  Surgery.  Intense exercise. HOME CARE INSTRUCTIONS   Take appropriate medicines for fever. Follow dosing instructions carefully. If you use acetaminophen to reduce the fever, be careful to avoid taking other medicines that also contain acetaminophen. Do not take aspirin for a fever if you are younger than age 42. There is an association with Reye's syndrome. Reye's syndrome is a rare but potentially deadly disease.  If an infection is present and antibiotics have been prescribed, take them as directed. Finish them even if you start to feel better.  Rest as needed.  Maintain an adequate fluid intake. To prevent dehydration during an illness with prolonged or recurrent fever, you may need to drink extra fluid.Drink enough fluids to keep your urine clear or pale yellow.  Sponging or bathing with room temperature water may help reduce body temperature. Do not use ice water or alcohol sponge baths.  Dress comfortably, but do not over-bundle. SEEK MEDICAL CARE IF:   You are unable to keep fluids down.  You develop vomiting or diarrhea.  You are not feeling at least partly better after 3  days.  You develop new symptoms or problems. SEEK IMMEDIATE MEDICAL CARE IF:   You have shortness of breath or trouble breathing.  You develop excessive weakness.  You are dizzy or you faint.  You are extremely thirsty or you are making little or no urine.  You develop new pain that was not there before (such as in the head, neck, chest, back, or abdomen).  You have persistent vomiting and diarrhea for more than 1 to 2 days.  You develop a stiff neck or your eyes become sensitive to light.  You develop a skin rash.  You have a fever or persistent symptoms for more than 2 to 3 days.  You have a fever and your symptoms suddenly get worse. MAKE SURE YOU:   Understand these instructions.  Will watch your condition.  Will get help right away if you are not doing well or get worse. Document Released: 01/21/2001 Document Revised: 12/12/2013 Document Reviewed: 05/29/2011 [facility] Patient Information 2015 [place], [place]. This information is not intended to replace advice given to you by your health care provider. Make sure you discuss any questions you have with your health care provider.

## 2015-03-01 NOTE — Telephone Encounter (Signed)
pls call pt: Advise Labs & urine culture do not indicate she has infection.  If she is still running fever & having back pain, I will order imaging.

## 2015-03-01 NOTE — ED Notes (Signed)
Pt dc home per MD order, dc instructions given, all belongings given to pt and husband at dc time. Pt assisted to the lobby on wheelchair.

## 2015-03-01 NOTE — ED Notes (Signed)
MD at the bedside  

## 2015-03-02 ENCOUNTER — Telehealth: Payer: Self-pay | Admitting: Family Medicine

## 2015-03-02 ENCOUNTER — Ambulatory Visit: Payer: Self-pay | Admitting: Family Medicine

## 2015-03-05 ENCOUNTER — Emergency Department (HOSPITAL_BASED_OUTPATIENT_CLINIC_OR_DEPARTMENT_OTHER)
Admission: EM | Admit: 2015-03-05 | Discharge: 2015-03-05 | Disposition: A | Discharge status: Home / Self Care | Payer: Self-pay | Attending: Emergency Medicine | Admitting: Emergency Medicine

## 2015-03-05 ENCOUNTER — Emergency Department (HOSPITAL_BASED_OUTPATIENT_CLINIC_OR_DEPARTMENT_OTHER): Payer: Self-pay

## 2015-03-05 ENCOUNTER — Encounter (HOSPITAL_BASED_OUTPATIENT_CLINIC_OR_DEPARTMENT_OTHER): Payer: Self-pay

## 2015-03-05 ENCOUNTER — Telehealth: Payer: Self-pay | Admitting: Family Medicine

## 2015-03-05 DIAGNOSIS — Z79899 Other long term (current) drug therapy: Secondary | ICD-10-CM | POA: Insufficient documentation

## 2015-03-05 DIAGNOSIS — R52 Pain, unspecified: Secondary | ICD-10-CM

## 2015-03-05 DIAGNOSIS — G43909 Migraine, unspecified, not intractable, without status migrainosus: Secondary | ICD-10-CM | POA: Insufficient documentation

## 2015-03-05 DIAGNOSIS — R300 Dysuria: Secondary | ICD-10-CM | POA: Insufficient documentation

## 2015-03-05 DIAGNOSIS — Z8719 Personal history of other diseases of the digestive system: Secondary | ICD-10-CM | POA: Insufficient documentation

## 2015-03-05 DIAGNOSIS — R109 Unspecified abdominal pain: Secondary | ICD-10-CM

## 2015-03-05 DIAGNOSIS — F329 Major depressive disorder, single episode, unspecified: Secondary | ICD-10-CM | POA: Insufficient documentation

## 2015-03-05 DIAGNOSIS — R509 Fever, unspecified: Secondary | ICD-10-CM | POA: Insufficient documentation

## 2015-03-05 DIAGNOSIS — Z792 Long term (current) use of antibiotics: Secondary | ICD-10-CM | POA: Insufficient documentation

## 2015-03-05 DIAGNOSIS — M549 Dorsalgia, unspecified: Secondary | ICD-10-CM | POA: Insufficient documentation

## 2015-03-05 DIAGNOSIS — Z91040 Latex allergy status: Secondary | ICD-10-CM | POA: Insufficient documentation

## 2015-03-05 DIAGNOSIS — Z90710 Acquired absence of both cervix and uterus: Secondary | ICD-10-CM | POA: Insufficient documentation

## 2015-03-05 DIAGNOSIS — R1031 Right lower quadrant pain: Secondary | ICD-10-CM | POA: Insufficient documentation

## 2015-03-05 DIAGNOSIS — R110 Nausea: Secondary | ICD-10-CM | POA: Insufficient documentation

## 2015-03-05 DIAGNOSIS — Z880 Allergy status to penicillin: Secondary | ICD-10-CM | POA: Insufficient documentation

## 2015-03-05 LAB — URINALYSIS, ROUTINE W REFLEX MICROSCOPIC
Bilirubin Urine: NEGATIVE
Glucose, UA: NEGATIVE mg/dL
Hgb urine dipstick: NEGATIVE
Ketones, ur: NEGATIVE mg/dL
LEUKOCYTES UA: NEGATIVE
Nitrite: NEGATIVE
Protein, ur: NEGATIVE mg/dL
SPECIFIC GRAVITY, URINE: 1.024 (ref 1.005–1.030)
Urobilinogen, UA: 0.2 mg/dL (ref 0.0–1.0)
pH: 6 (ref 5.0–8.0)

## 2015-03-05 LAB — LIPASE, BLOOD: Lipase: 23 U/L (ref 22–51)

## 2015-03-05 LAB — CBC WITH DIFFERENTIAL/PLATELET
BASOS ABS: 0 10*3/uL (ref 0.0–0.1)
Basophils Relative: 0 % (ref 0–1)
EOS ABS: 0.2 10*3/uL (ref 0.0–0.7)
Eosinophils Relative: 3 % (ref 0–5)
HEMATOCRIT: 38.1 % (ref 36.0–46.0)
HEMOGLOBIN: 12.5 g/dL (ref 12.0–15.0)
LYMPHS PCT: 44 % (ref 12–46)
Lymphs Abs: 2.8 10*3/uL (ref 0.7–4.0)
MCH: 30.7 pg (ref 26.0–34.0)
MCHC: 32.8 g/dL (ref 30.0–36.0)
MCV: 93.6 fL (ref 78.0–100.0)
MONO ABS: 0.5 10*3/uL (ref 0.1–1.0)
MONOS PCT: 9 % (ref 3–12)
NEUTROS PCT: 44 % (ref 43–77)
Neutro Abs: 2.8 10*3/uL (ref 1.7–7.7)
Platelets: 267 10*3/uL (ref 150–400)
RBC: 4.07 MIL/uL (ref 3.87–5.11)
RDW: 12.6 % (ref 11.5–15.5)
WBC: 6.2 10*3/uL (ref 4.0–10.5)

## 2015-03-05 LAB — WET PREP, GENITAL
TRICH WET PREP: NONE SEEN
Yeast Wet Prep HPF POC: NONE SEEN

## 2015-03-05 LAB — COMPREHENSIVE METABOLIC PANEL
ALT: 36 U/L (ref 14–54)
AST: 37 U/L (ref 15–41)
Albumin: 4.2 g/dL (ref 3.5–5.0)
Alkaline Phosphatase: 73 U/L (ref 38–126)
Anion gap: 6 (ref 5–15)
BUN: 22 mg/dL — AB (ref 6–20)
CHLORIDE: 111 mmol/L (ref 101–111)
CO2: 25 mmol/L (ref 22–32)
CREATININE: 0.75 mg/dL (ref 0.44–1.00)
Calcium: 9.2 mg/dL (ref 8.9–10.3)
GFR calc Af Amer: >60 mL/min (ref >60)
GLUCOSE: 92 mg/dL (ref 65–99)
Potassium: 3.8 mmol/L (ref 3.5–5.1)
Sodium: 142 mmol/L (ref 135–145)
TOTAL PROTEIN: 7.3 g/dL (ref 6.5–8.1)
Total Bilirubin: 0.2 mg/dL — ABNORMAL LOW (ref 0.3–1.2)

## 2015-03-05 MED ORDER — ONDANSETRON HCL 4 MG PO TABS: 4.0000 mg | ORAL_TABLET | Freq: Three times a day (TID) | ORAL | Status: DC | PRN

## 2015-03-05 MED ORDER — IOHEXOL 300 MG/ML  SOLN
100.0000 mL | Freq: Once | INTRAMUSCULAR | Status: AC | PRN
  Administered 2015-03-05: 100 mL via INTRAVENOUS

## 2015-03-05 MED ORDER — METOCLOPRAMIDE HCL 5 MG/ML IJ SOLN
10.0000 mg | Freq: Once | INTRAMUSCULAR | Status: AC
  Administered 2015-03-05: 10 mg via INTRAVENOUS
  Filled 2015-03-05: qty 2

## 2015-03-05 MED ORDER — IOHEXOL 300 MG/ML  SOLN
25.0000 mL | Freq: Once | INTRAMUSCULAR | Status: AC | PRN
  Administered 2015-03-05: 25 mL via ORAL

## 2015-03-05 MED ORDER — HYDROMORPHONE HCL 1 MG/ML IJ SOLN
1.0000 mg | Freq: Once | INTRAMUSCULAR | Status: AC
  Administered 2015-03-05: 1 mg via INTRAVENOUS
  Filled 2015-03-05: qty 1

## 2015-03-05 MED ORDER — SODIUM CHLORIDE 0.9 % IV BOLUS (SEPSIS)
1000.0000 mL | Freq: Once | INTRAVENOUS | Status: AC
  Administered 2015-03-05: 1000 mL via INTRAVENOUS

## 2015-03-05 MED ORDER — HYDROCODONE-ACETAMINOPHEN 5-325 MG PO TABS: 1.0000 | ORAL_TABLET | Freq: Four times a day (QID) | ORAL | Status: DC | PRN

## 2015-03-05 NOTE — ED Provider Notes (Signed)
Care assumed from Dr. JacubowitEthelda Chickn to follow up CT results.  CT shows no acute pathology.  She remains well appearing.  Plan to DC with PCP follow up.   Clinical Impression: 1. Abdominal pain, unspecified abdominal location   2. Pain       Blake Divine, MD 03/05/15 [phone]

## 2015-03-05 NOTE — Telephone Encounter (Signed)
Husband pick up.  Husband was not home, wife not present.  Husband states pt is experiencing a sense of urgency.  She is able to void, but unable to completely empty bladder.  Husband states she also has abdominal swelling.  States pt looks like she's 3 months pregnant.    Given report, husband was encouraged to take wife to ER.  He stated understanding and agreed.

## 2015-03-05 NOTE — ED Notes (Signed)
Prior note, "departure condition" is mistaken entry.

## 2015-03-05 NOTE — ED Notes (Signed)
C/o abd pain, lower back pain, bilat leg pain x 2.5 weeks-seen in ED, PCP for same-no dx made-was advised by Dr Beverely Low to come to ED

## 2015-03-05 NOTE — ED Provider Notes (Addendum)
CSN: [id number]     Arrival date & time 03/05/15  1352 History   First MD Initiated Contact with Patient 03/05/15 1409     Chief Complaint  Patient presents with  . Abdominal Pain     (Consider location/radiation/quality/duration/timing/severity/associated sxs/prior Treatment) HPI Complains of lower abdominal pain onset 2 weeks ago accompanied by fever with maximum temperature 101. Pain radiates around to back and to both thighs. She was treated with Tylenol medially prior to coming here.other associated symptoms include dysuria and feeling of bloating in her abdomen ., Associated with nausea. Patient was seen here on 03/01/2015 had urinalysis which was negative and had MRI scan of the lumbar spine which was normal Past Medical History  Diagnosis Date  . Fibromyalgia   . Depression   . Migraine   . GERD (gastroesophageal reflux disease)    Past Surgical History  Procedure Laterality Date  . Abdominal hysterectomy    . Ovarian cyst surgery    . Cholecystectomy     Family History  Problem Relation Age of Onset  . Kidney Stones Daughter    History  Substance Use Topics  . Smoking status: Never Smoker   . Smokeless tobacco: Never Used  . Alcohol Use: Yes     Comment: rarely   OB History    No data available     Review of Systems  Constitutional: Positive for fever.  Gastrointestinal: Positive for nausea and abdominal pain.  Genitourinary: Positive for dysuria.  Musculoskeletal: Positive for back pain.  All other systems reviewed and are negative.     Allergies  Bee venom; Azithromycin; Diphenhydramine hcl; Erythromycin; Latex; Morphine and related; Penicillins; Sulfonamide derivatives; and Tetracyclines & related  Home Medications   Prior to Admission medications   Medication Sig Start Date End Date Taking? Authorizing Provider  buPROPion (WELLBUTRIN XL) 300 MG 24 hr tablet TAKE 1 TABLET BY MOUTH DAILY. Patient not taking: Reported on 02/27/2015 02/18/15    Sheliah Hatch, MD  butalbital-acetaminophen-caffeine (FIORICET, ESGIC) [phone] MG per tablet TAKE 1-2 TABLETS BY MOUTH EVERY 6 HOURS AS NEEDED FOR HEADACHE 10/19/14   Sheliah Hatch, MD  ciprofloxacin (CIPRO) 500 MG tablet Take 1 tablet (500 mg total) by mouth 2 (two) times daily. 02/27/15   Kelle Darting, NP  EPINEPHrine (EPIPEN) 0.3 mg/0.3 mL SOAJ injection Inject 0.3 mLs (0.3 mg total) into the muscle once. 03/14/13   Sheliah Hatch, MD  HYDROcodone-acetaminophen (NORCO/VICODIN) 5-325 MG per tablet Take 1-2 tablets by mouth every 4 (four) hours as needed. 03/01/15   Benjiman Core, MD  Linaclotide [facility]) 145 MCG CAPS capsule Take 1 capsule (145 mcg total) by mouth daily. Patient not taking: Reported on 12/22/2014 10/13/14   Sheliah Hatch, MD  lubiprostone (AMITIZA) 24 MCG capsule Take 1 capsule (24 mcg total) by mouth 2 (two) times daily with a meal. Patient not taking: Reported on 12/22/2014 10/19/14   Sheliah Hatch, MD  magnesium 30 MG tablet Take 30 mg by mouth every other day.    Historical Provider, MD  Multiple Vitamin (MULTIVITAMIN WITH MINERALS) TABS tablet Take 1 tablet by mouth daily.    Historical Provider, MD  SAVELLA 100 MG TABS tablet TAKE 1/2 TABLET BY MOUTH TWICE DAILY 12/13/14   Sheliah Hatch, MD  topiramate (TOPAMAX) 200 MG tablet Take 1 tablet (200 mg total) by mouth 2 (two) times daily. 01/18/15   Sheliah Hatch, MD  traZODone (DESYREL) 50 MG tablet TAKE 1/2 TO 1 TABLET BY MOUTH  AT [facility] NEEDED FOR SLEEP. 12/13/14   Sheliah Hatch, MD   BP 121/72 mmHg  Pulse 95  Temp(Src) 98.8 F (37.1 C) (Oral)  Resp 18  Ht 5\' 1"  (1.549 m)  Wt 128 lb (58.06 kg)  BMI 24.20 kg/m2  SpO2 100% Physical Exam  Constitutional: She appears well-developed and well-nourished.  HENT:  Head: Normocephalic and atraumatic.  Eyes: Conjunctivae are normal. Pupils are equal, round, and reactive to light.  Neck: Neck supple. No tracheal deviation present. No  thyromegaly present.  Cardiovascular: Normal rate and regular rhythm.   No murmur heard. Pulmonary/Chest: Effort normal and breath sounds normal.  Abdominal: Soft. Bowel sounds are normal. She exhibits no distension and no mass. There is tenderness. There is no rebound and no guarding.  Tender right lower quadrant  Genitourinary: Vagina normal.  No external lesion. Cervical os absent., slight amount of whitish discharge Bilateral adnexal tenderness, mild  Musculoskeletal: Normal range of motion. She exhibits no edema or tenderness.  No flank tenderness. No point tenderness along spine  Neurological: She is alert. She has normal reflexes. Coordination normal.  DTRs symmetric bilaterally at knee jerk ankle jerk biceps toes or going bilaterally.  Skin: Skin is warm and dry. No rash noted.  Psychiatric: She has a normal mood and affect.  Nursing note and vitals reviewed.   ED Course  Procedures (including critical care time) Labs Review Labs Reviewed  URINALYSIS, ROUTINE W REFLEX MICROSCOPIC (NOT AT [facility])    Imaging Review No results found.   EKG Interpretation None     Results for orders placed or performed during the hospital encounter of 03/05/15  Wet prep, genital  Result Value Ref Range   Yeast Wet Prep HPF POC NONE SEEN NONE SEEN   Trich, Wet Prep NONE SEEN NONE SEEN   Clue Cells Wet Prep HPF POC FEW (A) NONE SEEN   WBC, Wet Prep HPF POC MODERATE (A) NONE SEEN  Urinalysis, Routine w reflex microscopic (not at [facility])  Result Value Ref Range   Color, Urine YELLOW YELLOW   APPearance CLEAR CLEAR   Specific Gravity, Urine 1.024 1.005 - 1.030   pH 6.0 5.0 - 8.0   Glucose, UA NEGATIVE NEGATIVE mg/dL   Hgb urine dipstick NEGATIVE NEGATIVE   Bilirubin Urine NEGATIVE NEGATIVE   Ketones, ur NEGATIVE NEGATIVE mg/dL   Protein, ur NEGATIVE NEGATIVE mg/dL   Urobilinogen, UA 0.2 0.0 - 1.0 mg/dL   Nitrite NEGATIVE NEGATIVE   Leukocytes, UA NEGATIVE NEGATIVE  CBC with  Differential/Platelet  Result Value Ref Range   WBC 6.2 4.0 - 10.5 K/uL   RBC 4.07 3.87 - 5.11 MIL/uL   Hemoglobin 12.5 12.0 - 15.0 g/dL   HCT 16.1 09.6 - 04.5 %   MCV 93.6 78.0 - 100.0 fL   MCH 30.7 26.0 - 34.0 pg   MCHC 32.8 30.0 - 36.0 g/dL   RDW 40.9 81.1 - 91.4 %   Platelets 267 150 - 400 K/uL   Neutrophils Relative % 44 43 - 77 %   Neutro Abs 2.8 1.7 - 7.7 K/uL   Lymphocytes Relative 44 12 - 46 %   Lymphs Abs 2.8 0.7 - 4.0 K/uL   Monocytes Relative 9 3 - 12 %   Monocytes Absolute 0.5 0.1 - 1.0 K/uL   Eosinophils Relative 3 0 - 5 %   Eosinophils Absolute 0.2 0.0 - 0.7 K/uL   Basophils Relative 0 0 - 1 %   Basophils Absolute 0.0 0.0 - 0.1  K/uL   Dg Chest 2 View  03/01/2015   CLINICAL DATA:  Shortness of breath, fever  EXAM: CHEST  2 VIEW  COMPARISON:  12/22/2014  FINDINGS: Cardiomediastinal silhouette is stable. Mild lower thoracic levoscoliosis. No acute infiltrate or pleural effusion. No pulmonary edema. Mild degenerative changes lower thoracic spine.  IMPRESSION: No active cardiopulmonary disease.   Electronically Signed   By: Natasha Mead M.D.   On: 03/01/2015 18:20   Mr Lumbar Spine W Wo Contrast  03/01/2015   CLINICAL DATA:  Back pain with fever.  EXAM: MRI LUMBAR SPINE WITHOUT AND WITH CONTRAST  TECHNIQUE: Multiplanar and multiecho pulse sequences of the lumbar spine were obtained without and with intravenous contrast.  CONTRAST:  10mL MULTIHANCE GADOBENATE DIMEGLUMINE 529 MG/ML IV SOLN  COMPARISON:  None.  FINDINGS: Image quality degraded by mild motion  Normal alignment. Negative for fracture or mass. Hemangioma L3 on the left.  No evidence of spinal infection. Disc spaces are maintained without disc space narrowing. No bone marrow edema. Normal enhancement following contrast infusion  Negative for disc protrusion or spinal stenosis. No significant degenerative changes in the lumbar spine.  IMPRESSION: Negative   Electronically Signed   By: Marlan Palau M.D.   On: 03/01/2015  19:25  3:50 PM pain and nausea improved after treatment with intravenous opiates and antiemetics  MDM  Spoke with Dr.Tabrori. Symptoms are vague, nonspecific lasting several weeks.obtain CT scan of abdomen and pelvis. If no emergent condition exists, she will follow up in office, symptomatic care Final diagnoses:  None   Patient signed out to Dr.Wofford at 3:55PM Diagnosis  abdominal pain      Doug Sou, MD 03/05/15 1547  Doug Sou, MD 03/05/15 1554

## 2015-03-05 NOTE — ED Notes (Signed)
Patient transported to CT 

## 2015-03-05 NOTE — Discharge Instructions (Signed)
Abdominal Pain, Women °Abdominal (stomach, pelvic, or belly) pain can be caused by many things. It is important to tell your doctor: °· The location of the pain. °· Does it come and go or is it present all the time? °· Are there things that start the pain (eating certain foods, exercise)? °· Are there other symptoms associated with the pain (fever, nausea, vomiting, diarrhea)? °All of this is helpful to know when trying to find the cause of the pain. °CAUSES  °· Stomach: virus or bacteria infection, or ulcer. °· Intestine: appendicitis (inflamed appendix), regional ileitis (Crohn's disease), ulcerative colitis (inflamed colon), irritable bowel syndrome, diverticulitis (inflamed diverticulum of the colon), or cancer of the stomach or intestine. °· Gallbladder disease or stones in the gallbladder. °· Kidney disease, kidney stones, or infection. °· Pancreas infection or cancer. °· Fibromyalgia (pain disorder). °· Diseases of the female organs: °¨ Uterus: fibroid (non-cancerous) tumors or infection. °¨ Fallopian tubes: infection or tubal pregnancy. °¨ Ovary: cysts or tumors. °¨ Pelvic adhesions (scar tissue). °¨ Endometriosis (uterus lining tissue growing in the pelvis and on the pelvic organs). °¨ Pelvic congestion syndrome (female organs filling up with blood just before the menstrual period). °¨ Pain with the menstrual period. °¨ Pain with ovulation (producing an egg). °¨ Pain with an IUD (intrauterine device, birth control) in the uterus. °¨ Cancer of the female organs. °· Functional pain (pain not caused by a disease, may improve without treatment). °· Psychological pain. °· Depression. °DIAGNOSIS  °Your doctor will decide the seriousness of your pain by doing an examination. °· Blood tests. °· X-rays. °· Ultrasound. °· CT scan (computed tomography, special type of X-ray). °· MRI (magnetic resonance imaging). °· Cultures, for infection. °· Barium enema (dye inserted in the large intestine, to better view it with  X-rays). °· Colonoscopy (looking in intestine with a lighted tube). °· Laparoscopy (minor surgery, looking in abdomen with a lighted tube). °· Major abdominal exploratory surgery (looking in abdomen with a large incision). °TREATMENT  °The treatment will depend on the cause of the pain.  °· Many cases can be observed and treated at home. °· Over-the-counter medicines recommended by your caregiver. °· Prescription medicine. °· Antibiotics, for infection. °· Birth control pills, for painful periods or for ovulation pain. °· Hormone treatment, for endometriosis. °· Nerve blocking injections. °· Physical therapy. °· Antidepressants. °· Counseling with a psychologist or psychiatrist. °· Minor or major surgery. °HOME CARE INSTRUCTIONS  °· Do not take laxatives, unless directed by your caregiver. °· Take over-the-counter pain medicine only if ordered by your caregiver. Do not take aspirin because it can cause an upset stomach or bleeding. °· Try a clear liquid diet (broth or water) as ordered by your caregiver. Slowly move to a bland diet, as tolerated, if the pain is related to the stomach or intestine. °· Have a thermometer and take your temperature several times a day, and record it. °· Bed rest and sleep, if it helps the pain. °· Avoid sexual intercourse, if it causes pain. °· Avoid stressful situations. °· Keep your follow-up appointments and tests, as your caregiver orders. °· If the pain does not go away with medicine or surgery, you may try: °¨ Acupuncture. °¨ Relaxation exercises (yoga, meditation). °¨ Group therapy. °¨ Counseling. °SEEK MEDICAL CARE IF:  °· You notice certain foods cause stomach pain. °· Your home care treatment is not helping your pain. °· You need stronger pain medicine. °· You want your IUD removed. °· You feel faint or   lightheaded. °· You develop nausea and vomiting. °· You develop a rash. °· You are having side effects or an allergy to your medicine. °SEEK IMMEDIATE MEDICAL CARE IF:  °· Your  pain does not go away or gets worse. °· You have a fever. °· Your pain is felt only in portions of the abdomen. The right side could possibly be appendicitis. The left lower portion of the abdomen could be colitis or diverticulitis. °· You are passing blood in your stools (bright red or black tarry stools, with or without vomiting). °· You have blood in your urine. °· You develop chills, with or without a fever. °· You pass out. °MAKE SURE YOU:  °· Understand these instructions. °· Will watch your condition. °· Will get help right away if you are not doing well or get worse. °Document Released: 05/25/2007 Document Revised: 12/12/2013 Document Reviewed: 06/14/2009 °Exit[employer]® Patient Information ©2015 [employer]. This information is not intended to replace advice given to you by your health care provider. Make sure you discuss any questions you have with your health care provider. ° °

## 2015-03-05 NOTE — ED Notes (Signed)
Pt preparing for d/c, dr. Loretha Stapler to speak with pt regarding test results.

## 2015-03-05 NOTE — ED Notes (Signed)
MD at bedside. 

## 2015-03-05 NOTE — ED Notes (Signed)
Pt states that her pain is returing, md alerted.

## 2015-03-05 NOTE — Telephone Encounter (Signed)
Agree w/ ER

## 2015-03-05 NOTE — Telephone Encounter (Signed)
[facility] - Client TELEPHONE ADVICE RECORD [facility] Call [facility]  Patient Name: Martha Moss  DOB: 05-19-1966    Initial Comment Caller states wife has a fever and severe back pain   Nurse Assessment  Nurse: Laural Benes, RN, Dondra Spry Date/Time (Eastern Time): 03/05/2015 9:58:07 AM  Confirm and document reason for call. If symptomatic, describe symptoms. ---[name] has back pain and fever for two weeks. having trouble urinating -- UTI negative x 2 weeks -- trouble defecating  Has the patient traveled out of the country within the last 30 days? ---No  Does the patient require triage? ---Yes  Related visit to physician within the last 2 weeks? ---Yes  Does the PT have any chronic conditions? (i.e. diabetes, asthma, etc.) ---Unknown  Did the patient indicate they were pregnant? ---No     Guidelines    Guideline Title Affirmed Question Affirmed Notes  Back Pain [1] SEVERE back pain (e.g., excruciating, unable to do any normal activities) AND [2] not improved 2 hours after pain medicine    Final Disposition User   See Physician within 4 Hours (or PCP triage) Laural Benes, RN, Dondra Spry    Comments  Appt scheduled tomorrow with PCP at 945am wanted to see PCP provider not another MD in practice   Referrals  REFERRED TO PCP OFFICE   Disagree/Comply: Comply

## 2015-03-05 NOTE — Telephone Encounter (Signed)
Please see below.

## 2015-03-05 NOTE — Telephone Encounter (Signed)
If pt is unable to urinate, will need to be seen sooner than tomorrow.

## 2015-03-06 ENCOUNTER — Ambulatory Visit: Payer: Self-pay | Admitting: Family Medicine

## 2015-03-06 LAB — CULTURE, BLOOD (ROUTINE X 2)
CULTURE: NO GROWTH
Culture: NO GROWTH

## 2015-03-06 LAB — GC/CHLAMYDIA PROBE AMP (~~LOC~~) NOT AT ARMC
CHLAMYDIA, DNA PROBE: NEGATIVE
NEISSERIA GONORRHEA: NEGATIVE

## 2015-03-06 LAB — HIV ANTIBODY (ROUTINE TESTING W REFLEX): HIV Screen 4th Generation wRfx: NONREACTIVE

## 2015-03-06 LAB — RPR: RPR Ser Ql: NONREACTIVE

## 2015-03-09 NOTE — Telephone Encounter (Signed)
Pt was no show 03/02/15 10:45 am, acute appt, pt was in after this time for different reason, charge no show fee?

## 2015-03-12 NOTE — Telephone Encounter (Signed)
Do not charge NS fee. Per another [facility] provider pt was advised to go to ER.

## 2015-03-14 ENCOUNTER — Encounter: Payer: Self-pay | Admitting: Family Medicine

## 2015-03-14 ENCOUNTER — Ambulatory Visit (INDEPENDENT_AMBULATORY_CARE_PROVIDER_SITE_OTHER): Payer: Self-pay | Admitting: Family Medicine

## 2015-03-14 VITALS — BP 110/78 | HR 93 | Temp 98.4°F | Resp 16 | Ht 61.0 in | Wt 129.1 lb

## 2015-03-14 DIAGNOSIS — M797 Fibromyalgia: Secondary | ICD-10-CM

## 2015-03-14 MED ORDER — CYCLOBENZAPRINE HCL 10 MG PO TABS: 10.0000 mg | ORAL_TABLET | Freq: Three times a day (TID) | ORAL | Status: DC | PRN

## 2015-03-14 NOTE — Progress Notes (Signed)
   Subjective:    Patient ID: Martha Moss, female    DOB: March 10, 1966, 49 y.o.   MRN: [id number]  HPI ER f/u- pt saw NP on 7/19 for possible kidney infxn.  No evidence of UTI at time of visit.  Pt had MRI on 7/21 for possible discitis.  MRI was negative.  Went back to ER due to inability to urinate and severe pain w/ BM on 7/25- had normal CT scan.  Pt reports 'sometimes the pain is so bad it feels like my bones are crushing'.  Pain is in bilateral lower back, hips, and will radiate into legs.  Urinary sxs have resolved w/ exception of frequency, 'it definitely doesn't feel like a bladder infection'.  Pt has hx of Fibromyalgia- took tylenol arthritis w/o relief.     Review of Systems For ROS see HPI     Objective:   Physical Exam  Constitutional: She is oriented to person, place, and time. She appears well-developed and well-nourished. No distress.  HENT:  Head: Normocephalic and atraumatic.  Eyes: Conjunctivae and EOM are normal. Pupils are equal, round, and reactive to light.  Abdominal: Soft. Bowel sounds are normal. She exhibits no distension. There is no tenderness. There is no rebound and no guarding.  Musculoskeletal: She exhibits no edema.  Full flexion and extension of back w/o difficulty in office Ambulating down hall w/o difficulty  Neurological: She is alert and oriented to person, place, and time. She has normal reflexes. No cranial nerve deficit. Coordination normal.  (-) SLR bilaterally  Skin: Skin is warm and dry.  Psychiatric: Her behavior is normal. Thought content normal.  anxious  Vitals reviewed.         Assessment & Plan:

## 2015-03-14 NOTE — Patient Instructions (Signed)
Follow up as needed Start the muscle relaxer daily- 1/2 tab, 1/2 tab, whole tab before bed- for 3-5 days and then as needed HEAT!!! Gentle stretching to avoid stiffness Return to activity if you feel able If the pain continues or worsens, please let me know! Call with any questions or concerns Hang in there!!

## 2015-03-14 NOTE — Progress Notes (Signed)
Pre visit review using our clinic review tool, if applicable. No additional management support is needed unless otherwise documented below in the visit note. 

## 2015-04-10 NOTE — Assessment & Plan Note (Signed)
Deteriorated.  Suspect pt's pain is due to fibro flare given lack of UTI, discitis on MRI, no evidence of abdominal pathology on CT scan.  Pt is able to ambulate in office w/o difficulty and has no pain on PE today.  Already has pain meds available.  Will start muscle relaxer to use to improve spasm.  If no improvement, pt will need referral to specialist for complete evaluation.  Reviewed supportive care and red flags that should prompt return.  Pt expressed understanding and is in agreement w/ plan.

## 2015-04-30 ENCOUNTER — Other Ambulatory Visit: Payer: Self-pay | Admitting: Family Medicine

## 2015-04-30 NOTE — Telephone Encounter (Signed)
Last OV 03/14/15 topamax last filled 01/18/15 #60 with 1

## 2015-04-30 NOTE — Telephone Encounter (Signed)
Medication filled to pharmacy as requested.   

## 2015-05-21 ENCOUNTER — Other Ambulatory Visit: Payer: Self-pay | Admitting: *Deleted

## 2015-05-21 DIAGNOSIS — N390 Urinary tract infection, site not specified: Secondary | ICD-10-CM

## 2015-05-21 MED ORDER — NITROFURANTOIN MONOHYD MACRO 100 MG PO CAPS: 100.0000 mg | ORAL_CAPSULE | Freq: Two times a day (BID) | ORAL | Status: DC

## 2015-06-19 ENCOUNTER — Other Ambulatory Visit: Payer: Self-pay | Admitting: Family Medicine

## 2015-06-20 NOTE — Telephone Encounter (Signed)
Medication filled to pharmacy as requested.   

## 2015-08-10 ENCOUNTER — Other Ambulatory Visit: Payer: Self-pay | Admitting: Family Medicine

## 2015-08-10 NOTE — Telephone Encounter (Signed)
Medication filled to pharmacy as requested.   

## 2015-08-14 ENCOUNTER — Ambulatory Visit (INDEPENDENT_AMBULATORY_CARE_PROVIDER_SITE_OTHER): Payer: Self-pay | Admitting: Family Medicine

## 2015-08-14 ENCOUNTER — Encounter: Payer: Self-pay | Admitting: Family Medicine

## 2015-08-14 VITALS — BP 118/78 | HR 87 | Temp 98.0°F | Resp 17 | Ht 61.0 in | Wt 128.1 lb

## 2015-08-14 DIAGNOSIS — J019 Acute sinusitis, unspecified: Secondary | ICD-10-CM

## 2015-08-14 MED ORDER — PROMETHAZINE-DM 6.25-15 MG/5ML PO SYRP: 5.0000 mL | ORAL_SOLUTION | Freq: Four times a day (QID) | ORAL | Status: DC | PRN

## 2015-08-14 MED ORDER — LEVOFLOXACIN 500 MG PO TABS: 500.0000 mg | ORAL_TABLET | Freq: Every day | ORAL | Status: DC

## 2015-08-14 NOTE — Assessment & Plan Note (Signed)
Pt's sxs and PE consistent w/ infxn.  Due to multiple allergies to medications, will treat w/ Levaquin.  Cough meds prn.  Reviewed supportive care and red flags that should prompt return.  Pt expressed understanding and is in agreement w/ plan.

## 2015-08-14 NOTE — Progress Notes (Signed)
Pre visit review using our clinic review tool, if applicable. No additional management support is needed unless otherwise documented below in the visit note. 

## 2015-08-14 NOTE — Patient Instructions (Signed)
Follow up as needed Start the Levaquin daily x10 days Use the cough syrup as needed- will cause drowsiness Continue Mucinex DM for daytime cough REST! Drink plenty of fluids Hang in there! Happy New Year!!!

## 2015-08-14 NOTE — Progress Notes (Signed)
   Subjective:    Patient ID: Martha Moss, female    DOB: 10/15/1965, 50 y.o.   MRN: [id number]  HPI URI- + sick contacts.  sxs started 'about 12 weeks ago and it has been progressively getting worse'.  Took coricidin w/ some improvement this weekend.  Subjective fevers.  + sinus pain/pressure, HA, tooth pain.  Bilateral ear pain, L>R.  Cough is intermittently productive of 'yellow green grey stuff'.  Taking Mucinex.  Taking Allegra daily.  + nausea but no vomiting.    Review of Systems For ROS see HPI     Objective:   Physical Exam  Constitutional: She is oriented to person, place, and time. She appears well-developed and well-nourished. No distress.  HENT:  Head: Normocephalic and atraumatic.  Right Ear: Tympanic membrane normal.  Left Ear: Tympanic membrane normal.  Nose: Mucosal edema and rhinorrhea present. Right sinus exhibits maxillary sinus tenderness and frontal sinus tenderness. Left sinus exhibits maxillary sinus tenderness and frontal sinus tenderness.  Mouth/Throat: Uvula is midline and mucous membranes are normal. Posterior oropharyngeal erythema present. No oropharyngeal exudate.  Eyes: Conjunctivae and EOM are normal. Pupils are equal, round, and reactive to light.  Neck: Normal range of motion. Neck supple.  Cardiovascular: Normal rate, regular rhythm and normal heart sounds.   Pulmonary/Chest: Effort normal and breath sounds normal. No respiratory distress. She has no wheezes.  Lymphadenopathy:    She has no cervical adenopathy.  Neurological: She is alert and oriented to person, place, and time.  Skin: Skin is warm and dry.  Psychiatric: She has a normal mood and affect. Her behavior is normal. Thought content normal.  Vitals reviewed.         Assessment & Plan:

## 2015-08-24 ENCOUNTER — Telehealth: Payer: Self-pay | Admitting: Family Medicine

## 2015-08-24 NOTE — Telephone Encounter (Signed)
Caller name: Lyn Hollingsheadlexander    Relationship to patient: Spouse   Can be reached: [phone]  [facility] [place] [address]  Reason for call: Pt's spouse is requesting another 10 day supply on pt's antibiotic.

## 2015-08-27 NOTE — Telephone Encounter (Signed)
Called patient left message for callback regarding symptoms.

## 2015-08-28 NOTE — Telephone Encounter (Signed)
Spoke to the patients husband.  Patient is having sinus pressure, Headache, a lot of phlegm, no fever.  The husband stated his wife usually take 2 rounds of antibiotics to completely clear up her symptoms.  Advise please.

## 2015-08-29 ENCOUNTER — Other Ambulatory Visit: Payer: Self-pay | Admitting: Family Medicine

## 2015-08-29 ENCOUNTER — Telehealth: Payer: Self-pay

## 2015-08-29 MED ORDER — LEVOFLOXACIN 500 MG PO TABS: 500.0000 mg | ORAL_TABLET | Freq: Every day | ORAL | Status: DC

## 2015-08-29 NOTE — Telephone Encounter (Signed)
Patient's husband notified  

## 2015-08-29 NOTE — Telephone Encounter (Signed)
Spoke with husband regarding patient.

## 2015-08-29 NOTE — Telephone Encounter (Signed)
Ok for Levaquin  daily x7 days.  No additional refills w/o appt

## 2015-08-29 NOTE — Telephone Encounter (Signed)
-----   Message from Elliot Gault sent at 08/27/2015 10:38 AM EST ----- Regarding: returning your call  Contact: [phone] Patient returning your call

## 2015-09-15 ENCOUNTER — Other Ambulatory Visit: Payer: Self-pay | Admitting: Family Medicine

## 2015-09-17 NOTE — Telephone Encounter (Signed)
Medication filled to pharmacy as requested.   

## 2015-10-26 ENCOUNTER — Encounter: Payer: Self-pay | Admitting: Medical

## 2015-10-26 ENCOUNTER — Ambulatory Visit (INDEPENDENT_AMBULATORY_CARE_PROVIDER_SITE_OTHER): Payer: Self-pay | Admitting: Medical

## 2015-10-26 VITALS — BP 110/78 | HR 90 | Temp 98.0°F | Resp 16 | Ht 61.0 in | Wt 129.0 lb

## 2015-10-26 DIAGNOSIS — R05 Cough: Secondary | ICD-10-CM

## 2015-10-26 DIAGNOSIS — R059 Cough, unspecified: Secondary | ICD-10-CM

## 2015-10-26 MED ORDER — METHYLPREDNISOLONE ACETATE 40 MG/ML IJ SUSP
40.0000 mg | Freq: Once | INTRAMUSCULAR | Status: AC
  Administered 2015-10-26: 40 mg via INTRAMUSCULAR

## 2015-10-26 MED ORDER — FLUCONAZOLE 150 MG PO TABS: 150.0000 mg | ORAL_TABLET | Freq: Once | ORAL | Status: DC

## 2015-10-26 MED ORDER — LEVOFLOXACIN 500 MG PO TABS: 500.0000 mg | ORAL_TABLET | Freq: Every day | ORAL | Status: DC

## 2015-10-26 NOTE — Progress Notes (Signed)
Subjective:    Patient ID: Martha Moss, female    DOB: 07/20/1966, 50 y.o.   MRN: [id number]  HPI   Pt in with recent illness. Pt states at time in past some sinus infection that have been resistant to improvement.   Pt in January needed two rounds of levofloxin.Pt has history of sinus surgery 5-6 years ago.   Pt recently no fevers, no chills or sweats. Pt had on and off nasal congestion since January. But then recently past week sinus pain increasing again. And bring up thick mucous when she coughs.Blowing nose blows out mucous.  Pt has allergies to various meds.   Also some allergic rhinitis and is on allegra.     Review of Systems  Constitutional: Negative for fever, chills and fatigue.  HENT: Positive for congestion and sinus pressure. Negative for facial swelling, postnasal drip and sneezing.   Respiratory: Positive for cough. Negative for shortness of breath and wheezing.   Cardiovascular: Negative for chest pain and palpitations.  Gastrointestinal: Negative for abdominal pain.  Musculoskeletal: Negative for back pain.  Neurological: Negative for dizziness and headaches.  Hematological: Negative for adenopathy. Does not bruise/bleed easily.  Psychiatric/Behavioral: Negative for behavioral problems and confusion.    Past Medical History  Diagnosis Date  . Fibromyalgia   . Depression   . Migraine   . GERD (gastroesophageal reflux disease)     Social History   Social History  . Marital Status: Married    Spouse Name: N/A  . Number of Children: N/A  . Years of Education: N/A   Occupational History  . Not on file.   Social History Main Topics  . Smoking status: Never Smoker   . Smokeless tobacco: Never Used  . Alcohol Use: Yes     Comment: rarely  . Drug Use: No  . Sexual Activity: Yes    Birth Control/ Protection: Surgical   Other Topics Concern  . Not on file   Social History Narrative    Past Surgical History  Procedure Laterality Date   . Abdominal hysterectomy    . Ovarian cyst surgery    . Cholecystectomy      Family History  Problem Relation Age of Onset  . Kidney Stones Daughter     Allergies  Allergen Reactions  . Bee Venom   . Azithromycin     REACTION: hives  . Diphenhydramine Hcl     REACTION: hives  . Erythromycin     REACTION: hives  . Latex     REACTION: rash  . Morphine And Related Nausea And Vomiting  . Penicillins     REACTION: rash, fever  . Sulfonamide Derivatives     REACTION: rash, fever  . Tetracyclines & Related     Current Outpatient Prescriptions on File Prior to Visit  Medication Sig Dispense Refill  . buPROPion (WELLBUTRIN XL) 300 MG 24 hr tablet TAKE 1 TABLET BY MOUTH ONCE DAILY. 90 tablet 1  . cyclobenzaprine (FLEXERIL) 10 MG tablet Take 1 tablet (10 mg total) by mouth 3 (three) times daily as needed for muscle spasms. 45 tablet 0  . HYDROcodone-acetaminophen (NORCO/VICODIN) 5-325 MG per tablet Take 1-2 tablets by mouth every 6 (six) hours as needed. 12 tablet 0  . magnesium 30 MG tablet Take 30 mg by mouth every other day.    . Multiple Vitamin (MULTIVITAMIN WITH MINERALS) TABS tablet Take 1 tablet by mouth daily.    . promethazine-dextromethorphan (PROMETHAZINE-DM) 6.25-15 MG/5ML syrup Take 5 mLs by  mouth 4 (four) times daily as needed. 240 mL 0  . SAVELLA 100 MG TABS tablet TAKE 1/2 TABLET BY MOUTH TWICE DAILY 30 each 3  . topiramate (TOPAMAX) 200 MG tablet TAKE 1 TABLET BY MOUTH 2 TIMES DAILY. 60 tablet 3  . traZODone (DESYREL) 50 MG tablet TAKE 1/2 TO 1 TABLET BY MOUTH AT BEDTIMEAS NEEDED FOR SLEEP 90 tablet 0   No current facility-administered medications on file prior to visit.    BP 110/78 mmHg  Pulse 90  Temp(Src) 98 F (36.7 C) (Oral)  Resp 16  Ht  (1.549 m)  Wt 129 lb (58.514 kg)  BMI 24.39 kg/m2  SpO2 99%       Objective:   Physical Exam  General  Mental Status - Alert. General Appearance - Well groomed. Not in acute distress.  Skin Rashes-  No Rashes.  HEENT Head- Normal. Ear Auditory Canal - Left- Normal. Right - Normal.Tympanic Membrane- Left- Normal. Right- Normal. Eye Sclera/Conjunctiva- Left- Normal. Right- Normal. Nose & Sinuses Nasal Mucosa- Left-  Boggy and Congested. Right-  Boggy and  Congested Rt side maxillary pressure but no  frontal sinus pressure. Mouth & Throat Lips: Upper Lip- Normal: no dryness, cracking, pallor, cyanosis, or vesicular eruption. Lower Lip-Normal: no dryness, cracking, pallor, cyanosis or vesicular eruption. Buccal Mucosa- Bilateral- No Aphthous ulcers. Oropharynx- No Discharge or Erythema. Tonsils: Characteristics- Bilateral- No Erythema or Congestion. Size/Enlargement- Bilateral- No enlargement. Discharge- bilateral-None.  Neck Neck- Supple. No Masses.   Chest and Lung Exam Auscultation: Breath Sounds:-Clear even and unlabored.  Cardiovascular Auscultation:Rythm- Regular, rate and rhythm. Murmurs & Other Heart Sounds:Ausculatation of the heart reveal- No Murmurs.  Lymphatic Head & Neck General Head & Neck Lymphatics: Bilateral: Description- faint rt side submandibular enlargement.       Assessment & Plan:  You appear to have a sinus infection. I am prescribing  levofloxin antibiotic for the infection. To help with the nasal congestion use you nasocort.   For symptom relief in light of allergic rhinitis history we did give you depomedrol 40 mg im today.  If you are some better with  levofloxin  but not completely better after levofloxin then notify us and could extend second round of antibiotic. But if you are worse you would need to be seen.  Rest, hydrate, tylenol for fever.  Follow up in 7 days or as needed.

## 2015-10-26 NOTE — Patient Instructions (Addendum)
You appear to have a sinus infection. I am prescribing  levofloxin antibiotic for the infection. To help with the nasal congestion use you nasocort.   For symptom relief in light of allergic rhinitis history we did give you depomedrol 40 mg im today.  If you are some better with  levofloxin  but not completely better after levofloxin then notify us and could extend second round of antibiotic. But if you are worse you would need to be seen.  Rest, hydrate, tylenol for fever.  Follow up in 7-10 days or as needed.

## 2015-10-26 NOTE — Progress Notes (Signed)
Pre visit review using our clinic review tool, if applicable. No additional management support is needed unless otherwise documented below in the visit note. 

## 2015-11-02 ENCOUNTER — Telehealth: Payer: Self-pay | Admitting: Family Medicine

## 2015-11-02 MED ORDER — LEVOFLOXACIN 500 MG PO TABS: 500.0000 mg | ORAL_TABLET | Freq: Every day | ORAL | Status: DC

## 2015-11-02 NOTE — Telephone Encounter (Signed)
Spouse called in to request a second round of antibiotic. He says that one round isn't enough.   [facility] [place] [address]   CB: [phone]

## 2015-11-02 NOTE — Telephone Encounter (Signed)
Left message for pt that Rx was sent to the pharmacy and he wants the patient to follow up next week for an appointment.

## 2015-11-09 ENCOUNTER — Other Ambulatory Visit: Payer: Self-pay | Admitting: Family Medicine

## 2015-12-01 ENCOUNTER — Other Ambulatory Visit: Payer: Self-pay | Admitting: Family Medicine

## 2015-12-03 NOTE — Telephone Encounter (Signed)
Medication filled to pharmacy as requested.   

## 2015-12-24 ENCOUNTER — Other Ambulatory Visit: Payer: Self-pay | Admitting: Family Medicine

## 2015-12-24 NOTE — Telephone Encounter (Signed)
Medication filled to pharmacy as requested.   

## 2016-01-28 ENCOUNTER — Other Ambulatory Visit: Payer: Self-pay | Admitting: General Practice

## 2016-01-28 ENCOUNTER — Encounter: Payer: Self-pay | Admitting: General Practice

## 2016-01-28 MED ORDER — TOPIRAMATE 200 MG PO TABS: ORAL_TABLET | ORAL | Status: DC

## 2016-01-28 NOTE — Telephone Encounter (Signed)
Letter mailed to inform pt.  

## 2016-01-28 NOTE — Telephone Encounter (Signed)
Ok to fill but please have her schedule CPE

## 2016-01-28 NOTE — Telephone Encounter (Signed)
Last OV 08/14/15 (sinus), pt has not had a CPE since she has been your patient in 2013.  No upcoming appts

## 2016-03-21 ENCOUNTER — Ambulatory Visit: Payer: Self-pay | Admitting: Family Medicine

## 2016-03-26 ENCOUNTER — Other Ambulatory Visit: Payer: Self-pay | Admitting: General Practice

## 2016-03-26 MED ORDER — BUPROPION HCL ER (XL) 300 MG PO TB24: 300.0000 mg | ORAL_TABLET | Freq: Every day | ORAL | 0 refills | Status: DC

## 2016-03-27 ENCOUNTER — Encounter: Payer: Self-pay | Admitting: Family Medicine

## 2016-03-27 ENCOUNTER — Ambulatory Visit (INDEPENDENT_AMBULATORY_CARE_PROVIDER_SITE_OTHER): Payer: Self-pay | Admitting: Family Medicine

## 2016-03-27 VITALS — BP 124/80 | HR 78 | Temp 98.0°F | Resp 16 | Ht 61.0 in | Wt 129.5 lb

## 2016-03-27 DIAGNOSIS — J0100 Acute maxillary sinusitis, unspecified: Secondary | ICD-10-CM

## 2016-03-27 DIAGNOSIS — G43009 Migraine without aura, not intractable, without status migrainosus: Secondary | ICD-10-CM

## 2016-03-27 DIAGNOSIS — F418 Other specified anxiety disorders: Secondary | ICD-10-CM

## 2016-03-27 MED ORDER — LEVOFLOXACIN 500 MG PO TABS: 500.0000 mg | ORAL_TABLET | Freq: Every day | ORAL | 0 refills | Status: DC

## 2016-03-27 MED ORDER — BUPROPION HCL ER (XL) 300 MG PO TB24: 300.0000 mg | ORAL_TABLET | Freq: Every day | ORAL | 1 refills | Status: DC

## 2016-03-27 MED ORDER — TRAZODONE HCL 50 MG PO TABS: ORAL_TABLET | ORAL | 3 refills | Status: DC

## 2016-03-27 MED ORDER — TOPIRAMATE 200 MG PO TABS: ORAL_TABLET | ORAL | 6 refills | Status: DC

## 2016-03-27 NOTE — Assessment & Plan Note (Signed)
Recurrent problem.  Pt's sxs and PE consistent w/ infxn.  Due to multiple allergies, pt is only able to take Levaquin.  Prescription given.  Reviewed supportive care and red flags that should prompt return.  Pt expressed understanding and is in agreement w/ plan.

## 2016-03-27 NOTE — Assessment & Plan Note (Signed)
Ongoing problem.  Symptoms are well controlled on current dose of Wellbutrin.  Using Trazodone for sleep.  Refills provided.  Will follow.

## 2016-03-27 NOTE — Progress Notes (Signed)
Pre visit review using our clinic review tool, if applicable. No additional management support is needed unless otherwise documented below in the visit note. 

## 2016-03-27 NOTE — Assessment & Plan Note (Signed)
Chronic problem.  Doing well on daily Topamax and since her Tragus piercing she reports HAs have decreased in frequency and severity.  Refills provided.  Will follow.

## 2016-03-27 NOTE — Patient Instructions (Signed)
Please try and schedule a physical in 6 months (I know it's hard without insurance!) Please complete the mammogram scholarship form Start the Levaquin as directed- once daily x10 days- take w/ food! Call with any questions or concerns Enjoy the rest of your summer!!!

## 2016-03-27 NOTE — Progress Notes (Signed)
   Subjective:    Patient ID: Martha Moss, female    DOB: 04/26/1966, 50 y.o.   MRN: [id number]  HPI Depression- doing well on Wellbutrin.  Denies current sxs.  Sleeping better since stopping Savella.  Using Trazodone w/ good results.  Migraines- pt still on Topamax.  Pt has not had recent migraine.  Sinus pressure- sxs started 3 days ago.  + HA.  + nasal congestion.  + tooth pain.  Bilateral ear fullness.  Health maintenance- due for repeat mammo but pt has no health insurance.  Form for scholarship printed.  Declines colonoscopy due to cost.  Declines labs as well.  Review of Systems For ROS see HPI     Objective:   Physical Exam  Constitutional: She appears well-developed and well-nourished. No distress.  HENT:  Head: Normocephalic and atraumatic.  Right Ear: Tympanic membrane normal.  Left Ear: Tympanic membrane normal.  Nose: Mucosal edema and rhinorrhea present. Right sinus exhibits maxillary sinus tenderness and frontal sinus tenderness. Left sinus exhibits maxillary sinus tenderness and frontal sinus tenderness.  Mouth/Throat: Uvula is midline and mucous membranes are normal. Posterior oropharyngeal erythema present. No oropharyngeal exudate.  Eyes: Conjunctivae and EOM are normal. Pupils are equal, round, and reactive to light.  Neck: Normal range of motion. Neck supple.  Cardiovascular: Normal rate, regular rhythm and normal heart sounds.   Pulmonary/Chest: Effort normal and breath sounds normal. No respiratory distress. She has no wheezes.  Lymphadenopathy:    She has no cervical adenopathy.          Assessment & Plan:

## 2016-04-25 ENCOUNTER — Encounter: Payer: Self-pay | Admitting: Family Medicine

## 2016-04-25 ENCOUNTER — Ambulatory Visit (INDEPENDENT_AMBULATORY_CARE_PROVIDER_SITE_OTHER): Payer: Self-pay | Admitting: Family Medicine

## 2016-04-25 VITALS — BP 112/80 | HR 77 | Temp 99.1°F | Resp 17 | Ht 61.0 in | Wt 131.0 lb

## 2016-04-25 DIAGNOSIS — J0101 Acute recurrent maxillary sinusitis: Secondary | ICD-10-CM

## 2016-04-25 MED ORDER — LEVOFLOXACIN 500 MG PO TABS: 500.0000 mg | ORAL_TABLET | Freq: Every day | ORAL | 0 refills | Status: DC

## 2016-04-25 NOTE — Assessment & Plan Note (Signed)
Pt's sxs and PE consistent w/ infxn.  Due to recurrent nature and hx of sinus surgery, will refer back to ENT and start abx.  Reviewed supportive care and red flags that should prompt return.  Pt expressed understanding and is in agreement w/ plan.

## 2016-04-25 NOTE — Patient Instructions (Signed)
Follow up as needed Start the Levaquin daily as directed- take w/ food Continue daily allergy medication Delsym or Mucinex DM for cough and congestion Drink plenty of fluids REST! Call with any questions or concerns Hang in there!!!

## 2016-04-25 NOTE — Progress Notes (Signed)
   Subjective:    Patient ID: Martha Moss, female    DOB: 02/18/1966, 50 y.o.   MRN: [id number]  HPI URI- 'same thing all the time.  Sinus infxn.' sxs started 4 days ago w/ nasal congestion and HA.  HA is frontal and behind eyes.  Now w/ maxillary pain and tooth pain.  + nausea due to PND.  Sore throat.  Bilateral ear fullness.  + cough.  Hx of similar.   Review of Systems For ROS see HPI     Objective:   Physical Exam  Constitutional: She appears well-developed and well-nourished. No distress.  HENT:  Head: Normocephalic and atraumatic.  Right Ear: Tympanic membrane normal.  Left Ear: Tympanic membrane normal.  Nose: Mucosal edema and rhinorrhea present. Right sinus exhibits maxillary sinus tenderness and frontal sinus tenderness. Left sinus exhibits maxillary sinus tenderness and frontal sinus tenderness.  Mouth/Throat: Uvula is midline and mucous membranes are normal. Posterior oropharyngeal erythema present. No oropharyngeal exudate.  Eyes: Conjunctivae and EOM are normal. Pupils are equal, round, and reactive to light.  Neck: Normal range of motion. Neck supple.  Cardiovascular: Normal rate, regular rhythm and normal heart sounds.   Pulmonary/Chest: Effort normal and breath sounds normal. No respiratory distress. She has no wheezes.  Lymphadenopathy:    She has no cervical adenopathy.  Vitals reviewed.         Assessment & Plan:

## 2016-04-25 NOTE — Progress Notes (Signed)
Pre visit review using our clinic review tool, if applicable. No additional management support is needed unless otherwise documented below in the visit note. 

## 2016-04-29 ENCOUNTER — Encounter: Payer: Self-pay | Admitting: Family Medicine

## 2016-04-29 ENCOUNTER — Ambulatory Visit (INDEPENDENT_AMBULATORY_CARE_PROVIDER_SITE_OTHER): Payer: Self-pay | Admitting: Family Medicine

## 2016-04-29 VITALS — BP 110/80 | HR 83 | Temp 98.3°F | Resp 16 | Ht 61.0 in | Wt 127.5 lb

## 2016-04-29 DIAGNOSIS — M7711 Lateral epicondylitis, right elbow: Secondary | ICD-10-CM

## 2016-04-29 DIAGNOSIS — M797 Fibromyalgia: Secondary | ICD-10-CM

## 2016-04-29 MED ORDER — PREDNISONE 10 MG PO TABS: ORAL_TABLET | ORAL | 0 refills | Status: DC

## 2016-04-29 MED ORDER — METHYLPREDNISOLONE ACETATE 80 MG/ML IJ SUSP
80.0000 mg | Freq: Once | INTRAMUSCULAR | Status: AC
  Administered 2016-04-29: 80 mg via INTRAMUSCULAR

## 2016-04-29 NOTE — Progress Notes (Signed)
Pre visit review using our clinic review tool, if applicable. No additional management support is needed unless otherwise documented below in the visit note. 

## 2016-04-29 NOTE — Progress Notes (Signed)
   Subjective:    Patient ID: Martha Moss, female    DOB: 09/10/1965, 50 y.o.   MRN: [id number]  HPI R arm pain- sxs started acutely on Saturday w/o injury.  Pt reports she is not able to raise arm, pull on her pants.  'i can lift it up but it's painful'.  Pt reports any motion of R arm is painful.  Pain is localized to R forearm and lateral epicondyle.  Pt went to UC on Sunday and was given cortisone shot in buttock w/ some relief.  Pt was told to stop Levaquin due to concern that this was a side effect.     Review of Systems For ROS see HPI     Objective:   Physical Exam  Constitutional: She is oriented to person, place, and time. She appears well-developed and well-nourished.  HENT:  Head: Normocephalic and atraumatic.  Cardiovascular: Intact distal pulses.   Musculoskeletal: She exhibits tenderness (TTP over R lateral epicondyle and R forearm). She exhibits no edema or deformity.  Full ROM of R shoulder  Neurological: She is alert and oriented to person, place, and time. She has normal reflexes.  Skin: Skin is warm and dry. No erythema.  Psychiatric: Her behavior is normal. Thought content normal.  Flat affect, withdrawn today  Vitals reviewed.         Assessment & Plan:  Lateral epicondylitis- new.  Pt is point tender over R lateral epicondyle- pain w/ supination.  Start pred taper.  Pt given depomedrol injxn today.  Ice.  Reviewed supportive care and red flags that should prompt return.  Pt expressed understanding and is in agreement w/ plan.   Fibro- suspect that pt's current lateral epicondylitis and sinus infxn has thrown her into a flair.  Prednisone should improve her sxs.  Reviewed supportive care and red flags that should prompt return.  Pt expressed understanding and is in agreement w/ plan.

## 2016-04-29 NOTE — Patient Instructions (Signed)
Follow up as needed Restart the Levaquin as directed Start the Prednisone taper tomorrow morning- take w/ food ICE the elbow and forearm Try and walk and remain active to avoid stiffness in the leg Call with any questions or concerns Hang in there!!!

## 2016-05-15 ENCOUNTER — Ambulatory Visit (INDEPENDENT_AMBULATORY_CARE_PROVIDER_SITE_OTHER): Payer: Self-pay | Admitting: Otolaryngology

## 2016-05-15 DIAGNOSIS — R51 Headache: Secondary | ICD-10-CM

## 2016-05-15 DIAGNOSIS — J310 Chronic rhinitis: Secondary | ICD-10-CM

## 2016-06-17 ENCOUNTER — Encounter: Payer: Self-pay | Admitting: Family Medicine

## 2016-06-17 ENCOUNTER — Ambulatory Visit (INDEPENDENT_AMBULATORY_CARE_PROVIDER_SITE_OTHER): Payer: Self-pay | Admitting: Family Medicine

## 2016-06-17 VITALS — BP 112/78 | HR 74 | Temp 98.8°F | Resp 14 | Ht 61.0 in | Wt 131.0 lb

## 2016-06-17 DIAGNOSIS — R350 Frequency of micturition: Secondary | ICD-10-CM

## 2016-06-17 DIAGNOSIS — R1011 Right upper quadrant pain: Secondary | ICD-10-CM

## 2016-06-17 DIAGNOSIS — K644 Residual hemorrhoidal skin tags: Secondary | ICD-10-CM

## 2016-06-17 DIAGNOSIS — J0101 Acute recurrent maxillary sinusitis: Secondary | ICD-10-CM

## 2016-06-17 LAB — POCT URINALYSIS DIPSTICK
BILIRUBIN UA: NEGATIVE
Blood, UA: NEGATIVE
GLUCOSE UA: NEGATIVE
Ketones, UA: NEGATIVE
LEUKOCYTES UA: NEGATIVE
Nitrite, UA: NEGATIVE
Protein, UA: NEGATIVE
Spec Grav, UA: >=1.03
Urobilinogen, UA: 0.2
pH, UA: 6

## 2016-06-17 LAB — CBC WITH DIFFERENTIAL/PLATELET
BASOS PCT: 0.7 % (ref 0.0–3.0)
Basophils Absolute: 0.1 10*3/uL (ref 0.0–0.1)
EOS ABS: 0.1 10*3/uL (ref 0.0–0.7)
Eosinophils Relative: 1.7 % (ref 0.0–5.0)
HCT: 42.1 % (ref 36.0–46.0)
HEMOGLOBIN: 14.2 g/dL (ref 12.0–15.0)
Lymphocytes Relative: 36.2 % (ref 12.0–46.0)
Lymphs Abs: 2.6 10*3/uL (ref 0.7–4.0)
MCHC: 33.7 g/dL (ref 30.0–36.0)
MCV: 93.3 fl (ref 78.0–100.0)
MONO ABS: 0.5 10*3/uL (ref 0.1–1.0)
Monocytes Relative: 6.7 % (ref 3.0–12.0)
Neutro Abs: 3.9 10*3/uL (ref 1.4–7.7)
Neutrophils Relative %: 54.7 % (ref 43.0–77.0)
Platelets: 250 10*3/uL (ref 150.0–400.0)
RBC: 4.51 Mil/uL (ref 3.87–5.11)
RDW: 14.5 % (ref 11.5–15.5)
WBC: 7.2 10*3/uL (ref 4.0–10.5)

## 2016-06-17 LAB — HEPATIC FUNCTION PANEL
ALBUMIN: 4.7 g/dL (ref 3.5–5.2)
ALT: 15 U/L (ref 0–35)
AST: 17 U/L (ref 0–37)
Alkaline Phosphatase: 73 U/L (ref 39–117)
Bilirubin, Direct: 0.1 mg/dL (ref 0.0–0.3)
Total Bilirubin: 0.3 mg/dL (ref 0.2–1.2)
Total Protein: 7.6 g/dL (ref 6.0–8.3)

## 2016-06-17 LAB — BASIC METABOLIC PANEL
BUN: 29 mg/dL — AB (ref 6–23)
CHLORIDE: 109 meq/L (ref 96–112)
CO2: 25 mEq/L (ref 19–32)
Calcium: 10.1 mg/dL (ref 8.4–10.5)
Creatinine, Ser: 0.91 mg/dL (ref 0.40–1.20)
GFR: 69.38 mL/min (ref >60.00)
GLUCOSE: 62 mg/dL — AB (ref 70–99)
Potassium: 3.9 mEq/L (ref 3.5–5.1)
Sodium: 143 mEq/L (ref 135–145)

## 2016-06-17 MED ORDER — CIPROFLOXACIN HCL 500 MG PO TABS: 500.0000 mg | ORAL_TABLET | Freq: Two times a day (BID) | ORAL | 0 refills | Status: DC

## 2016-06-17 MED ORDER — FLUCONAZOLE 150 MG PO TABS: 150.0000 mg | ORAL_TABLET | Freq: Once | ORAL | 0 refills | Status: AC

## 2016-06-17 MED ORDER — HYDROCORTISONE ACE-PRAMOXINE 2.5-1 % RE CREA: 1.0000 "application " | TOPICAL_CREAM | Freq: Three times a day (TID) | RECTAL | 0 refills | Status: DC

## 2016-06-17 NOTE — Progress Notes (Signed)
   Subjective:    Patient ID: Martha BenesSherry L Suman, female    DOB: 03/08/1966, 50 y.o.   MRN: [id number]  HPI ? UTI- pt reports frequency, LBP, urinary odor, suprapubic pain.  sxs started ~1 week ago w/ urinary odor and R flank pain.  'a real bad dull ache'.  + fatigue.  Will have urge to use restroom very frequently but not always able to go.  No fevers.  + chills.  + RUQ  Sinusitis- ongoing issue for pt.  Saw ENT 10/5 and he recommended CT scan and then follow up.  Pt has not had CT yet- per husband ENT is supposed to fax order today.  Continues to have sinus pressure, HA, drainage, nausea.  No vomiting.  Hemorrhoids- external, 'i can't get them to go in.  They look like cauliflower'.  Using OTC products and sitz baths w/o improvement.  Asking about next steps.   Review of Systems For ROS see HPI     Objective:   Physical Exam  Constitutional: She is oriented to person, place, and time. She appears well-developed and well-nourished. No distress.  HENT:  Head: Normocephalic and atraumatic.  Right Ear: Tympanic membrane normal.  Left Ear: Tympanic membrane normal.  Nose: Mucosal edema and rhinorrhea present. Right sinus exhibits maxillary sinus tenderness and frontal sinus tenderness. Left sinus exhibits maxillary sinus tenderness and frontal sinus tenderness.  Mouth/Throat: Uvula is midline and mucous membranes are normal. Posterior oropharyngeal erythema present. No oropharyngeal exudate.  Eyes: Conjunctivae and EOM are normal. Pupils are equal, round, and reactive to light.  Neck: Normal range of motion. Neck supple.  Cardiovascular: Normal rate, regular rhythm and normal heart sounds.   Pulmonary/Chest: Effort normal and breath sounds normal. No respiratory distress. She has no wheezes.  Abdominal: Soft. She exhibits no distension. There is tenderness (RUQ pain, mild suprapubic TTP, no CVA tenderness bilaterally). There is no rebound and no guarding.  Genitourinary:  Genitourinary  Comments: Pt declined exam today  Lymphadenopathy:    She has no cervical adenopathy.  Neurological: She is alert and oriented to person, place, and time.  Skin: Skin is warm and dry.  Psychiatric: She has a normal mood and affect. Her behavior is normal. Thought content normal.  Vitals reviewed.         Assessment & Plan:  Urinary frequency- pt's sxs are consistent w/ UTI but UA is negative today.  Given ongoing sinus issues and urinary issues, will start 5 days of Cipro while awaiting urine cx.  Reviewed supportive care and red flags that should prompt return.  Pt expressed understanding and is in agreement w/ plan.   Sinusitis- ongoing issue.  Pt saw ENT but there was some miscommunication regarding her CT scan and she has yet to have this done and f/u as directed.  She reports being in pain for >1 month.  Start cipro for urine- this should also somewhat improve her sinus sxs.  Encouraged her to f/u w/ ENT as directed  RUQ- pt is s/p cholecystectomy but having intermittent, sharp RUQ.  Not related to eating.  Hx of constipation- which may be the cause.  Check LFTs to r/o underlying abnormality.  Hemorrhoids- start Analpram.  If no improvement will need GI referral.  Pt expressed understanding and is in agreement w/ plan.

## 2016-06-17 NOTE — Patient Instructions (Signed)
Follow up as needed We'll notify you of your lab results and make any changes if needed Start the Cipro twice daily for both UTI and sinuses Take the Diflucan as needed for yeast Start the Analpram as directed for the hemorrhoids.  If no improvement, let me know and I'll refer you to GI Call with any questions or concerns Hang in there!!!

## 2016-06-17 NOTE — Progress Notes (Signed)
Pre visit review using our clinic review tool, if applicable. No additional management support is needed unless otherwise documented below in the visit note. 

## 2016-06-18 LAB — URINE CULTURE

## 2016-07-10 ENCOUNTER — Telehealth: Payer: Self-pay | Admitting: Family Medicine

## 2016-07-10 NOTE — Telephone Encounter (Signed)
Returned call to patient's husband who declined appointment due to cost.  He states patient has had multiple office visits and testing recently and they can not afford another to come in for another office visit.    They will continue to self treat patient with otc medications throughout the weekend and if the patient is no better by Monday they will call then to make an appointment.

## 2016-07-10 NOTE — Telephone Encounter (Signed)
Unfortunately the only medications she has been on for this are antibiotics and due to PCP being out of office we cannot prescribe anything with her being seen.

## 2016-07-10 NOTE — Telephone Encounter (Signed)
Patient's husband calling on behalf of patient to request rx for recurrent sinus issues.  Patient had MRI done and is scheduled to see specialist next week.  However, They have contacted specialist for prescription but was told they are unable to prescribe anything until patient has been seen next week.  [facility] [place] - [address] [phone] (Phone) [phone] (Fax)

## 2016-07-21 ENCOUNTER — Ambulatory Visit (INDEPENDENT_AMBULATORY_CARE_PROVIDER_SITE_OTHER): Payer: Self-pay | Admitting: Otolaryngology

## 2016-07-21 DIAGNOSIS — J322 Chronic ethmoidal sinusitis: Secondary | ICD-10-CM

## 2016-07-21 DIAGNOSIS — J321 Chronic frontal sinusitis: Secondary | ICD-10-CM

## 2016-07-21 DIAGNOSIS — J320 Chronic maxillary sinusitis: Secondary | ICD-10-CM

## 2016-08-25 ENCOUNTER — Other Ambulatory Visit: Payer: Self-pay | Admitting: Otolaryngology

## 2016-08-30 ENCOUNTER — Other Ambulatory Visit: Payer: Self-pay | Admitting: Family Medicine

## 2016-09-04 ENCOUNTER — Encounter (HOSPITAL_BASED_OUTPATIENT_CLINIC_OR_DEPARTMENT_OTHER): Payer: Self-pay | Admitting: *Deleted

## 2016-09-09 ENCOUNTER — Ambulatory Visit (HOSPITAL_BASED_OUTPATIENT_CLINIC_OR_DEPARTMENT_OTHER): Payer: BLUE CROSS/BLUE SHIELD | Admitting: Anesthesiology

## 2016-09-09 ENCOUNTER — Encounter (HOSPITAL_BASED_OUTPATIENT_CLINIC_OR_DEPARTMENT_OTHER)
Admission: RE | Disposition: A | Discharge status: Home / Self Care | Payer: Self-pay | Source: Ambulatory Visit | Attending: Otolaryngology

## 2016-09-09 ENCOUNTER — Ambulatory Visit (HOSPITAL_BASED_OUTPATIENT_CLINIC_OR_DEPARTMENT_OTHER)
Admission: RE | Admit: 2016-09-09 | Discharge: 2016-09-09 | Disposition: A | Discharge status: Home / Self Care | Payer: BLUE CROSS/BLUE SHIELD | Source: Ambulatory Visit | Attending: Otolaryngology | Admitting: Otolaryngology

## 2016-09-09 DIAGNOSIS — J329 Chronic sinusitis, unspecified: Secondary | ICD-10-CM | POA: Insufficient documentation

## 2016-09-09 DIAGNOSIS — J322 Chronic ethmoidal sinusitis: Secondary | ICD-10-CM

## 2016-09-09 DIAGNOSIS — J338 Other polyp of sinus: Secondary | ICD-10-CM | POA: Diagnosis not present

## 2016-09-09 DIAGNOSIS — J3489 Other specified disorders of nose and nasal sinuses: Secondary | ICD-10-CM | POA: Diagnosis not present

## 2016-09-09 DIAGNOSIS — J320 Chronic maxillary sinusitis: Secondary | ICD-10-CM | POA: Diagnosis not present

## 2016-09-09 DIAGNOSIS — J321 Chronic frontal sinusitis: Secondary | ICD-10-CM | POA: Diagnosis not present

## 2016-09-09 DIAGNOSIS — J343 Hypertrophy of nasal turbinates: Secondary | ICD-10-CM | POA: Insufficient documentation

## 2016-09-09 HISTORY — DX: Nausea with vomiting, unspecified: R11.2

## 2016-09-09 HISTORY — PX: ETHMOIDECTOMY: SHX5197

## 2016-09-09 HISTORY — PX: MAXILLARY ANTROSTOMY: SHX2003

## 2016-09-09 HISTORY — PX: TURBINATE REDUCTION: SHX6157

## 2016-09-09 HISTORY — PX: SINUS ENDO W/FUSION: SHX777

## 2016-09-09 HISTORY — PX: FRONTAL SINUS EXPLORATION: SHX6591

## 2016-09-09 HISTORY — DX: Other specified postprocedural states: Z98.890

## 2016-09-09 SURGERY — REDUCTION, NASAL TURBINATE
Anesthesia: General | Laterality: Bilateral

## 2016-09-09 MED ORDER — FENTANYL CITRATE (PF) 100 MCG/2ML IJ SOLN
INTRAMUSCULAR | Status: AC
  Filled 2016-09-09: qty 2

## 2016-09-09 MED ORDER — OXYMETAZOLINE HCL 0.05 % NA SOLN
NASAL | Status: AC
  Filled 2016-09-09: qty 15

## 2016-09-09 MED ORDER — HYDROMORPHONE HCL 1 MG/ML IJ SOLN
0.2500 mg | INTRAMUSCULAR | Status: DC | PRN
  Administered 2016-09-09: 0.25 mg via INTRAVENOUS

## 2016-09-09 MED ORDER — OXYMETAZOLINE HCL 0.05 % NA SOLN
NASAL | Status: DC | PRN
  Administered 2016-09-09 (×2): 1

## 2016-09-09 MED ORDER — ONDANSETRON HCL 4 MG/2ML IJ SOLN
INTRAMUSCULAR | Status: DC | PRN
  Administered 2016-09-09: 4 mg via INTRAVENOUS

## 2016-09-09 MED ORDER — SUCCINYLCHOLINE CHLORIDE 20 MG/ML IJ SOLN: INTRAMUSCULAR | Status: DC | PRN

## 2016-09-09 MED ORDER — MUPIROCIN 2 % EX OINT
TOPICAL_OINTMENT | CUTANEOUS | Status: AC
  Filled 2016-09-09: qty 22

## 2016-09-09 MED ORDER — PROMETHAZINE HCL 25 MG/ML IJ SOLN: 6.2500 mg | INTRAMUSCULAR | Status: DC | PRN

## 2016-09-09 MED ORDER — SCOPOLAMINE 1 MG/3DAYS TD PT72
1.0000 | MEDICATED_PATCH | Freq: Once | TRANSDERMAL | Status: DC | PRN
  Administered 2016-09-09: 1.5 mg via TRANSDERMAL

## 2016-09-09 MED ORDER — ONDANSETRON HCL 4 MG/2ML IJ SOLN
INTRAMUSCULAR | Status: AC
Start: 2016-09-09 — End: 2016-09-09
  Filled 2016-09-09: qty 2

## 2016-09-09 MED ORDER — OXYCODONE-ACETAMINOPHEN 5-325 MG PO TABS: 1.0000 | ORAL_TABLET | ORAL | 0 refills | Status: DC | PRN

## 2016-09-09 MED ORDER — PROPOFOL 10 MG/ML IV BOLUS: INTRAVENOUS | Status: DC | PRN

## 2016-09-09 MED ORDER — HYDROMORPHONE HCL 1 MG/ML IJ SOLN
INTRAMUSCULAR | Status: AC
  Filled 2016-09-09: qty 1

## 2016-09-09 MED ORDER — PROPOFOL 10 MG/ML IV BOLUS
INTRAVENOUS | Status: AC
  Filled 2016-09-09: qty 20

## 2016-09-09 MED ORDER — ARTIFICIAL TEARS OP OINT
TOPICAL_OINTMENT | OPHTHALMIC | Status: AC
  Filled 2016-09-09: qty 3.5

## 2016-09-09 MED ORDER — DEXAMETHASONE SODIUM PHOSPHATE 4 MG/ML IJ SOLN
INTRAMUSCULAR | Status: DC | PRN
  Administered 2016-09-09: 10 mg via INTRAVENOUS

## 2016-09-09 MED ORDER — CLINDAMYCIN PHOSPHATE 600 MG/50ML IV SOLN
INTRAVENOUS | Status: DC | PRN
  Administered 2016-09-09: 600 mg via INTRAVENOUS

## 2016-09-09 MED ORDER — BACITRACIN ZINC 500 UNIT/GM EX OINT
TOPICAL_OINTMENT | CUTANEOUS | Status: AC
  Filled 2016-09-09: qty 28.35

## 2016-09-09 MED ORDER — LIDOCAINE 2% (20 MG/ML) 5 ML SYRINGE
INTRAMUSCULAR | Status: AC
  Filled 2016-09-09: qty 5

## 2016-09-09 MED ORDER — MIDAZOLAM HCL 2 MG/2ML IJ SOLN
INTRAMUSCULAR | Status: AC
  Filled 2016-09-09: qty 2

## 2016-09-09 MED ORDER — DEXAMETHASONE SODIUM PHOSPHATE 10 MG/ML IJ SOLN
INTRAMUSCULAR | Status: AC
  Filled 2016-09-09: qty 1

## 2016-09-09 MED ORDER — FENTANYL CITRATE (PF) 100 MCG/2ML IJ SOLN
50.0000 ug | INTRAMUSCULAR | Status: AC | PRN
  Administered 2016-09-09: 25 ug via INTRAVENOUS
  Administered 2016-09-09 (×2): 50 ug via INTRAVENOUS
  Administered 2016-09-09: 25 ug via INTRAVENOUS

## 2016-09-09 MED ORDER — SUCCINYLCHOLINE CHLORIDE 20 MG/ML IJ SOLN
INTRAMUSCULAR | Status: DC | PRN
  Administered 2016-09-09: 100 mg via INTRAVENOUS

## 2016-09-09 MED ORDER — CLINDAMYCIN HCL 300 MG PO CAPS: 300.0000 mg | ORAL_CAPSULE | Freq: Three times a day (TID) | ORAL | 0 refills | Status: AC

## 2016-09-09 MED ORDER — MIDAZOLAM HCL 2 MG/2ML IJ SOLN
1.0000 mg | INTRAMUSCULAR | Status: DC | PRN
  Administered 2016-09-09: 2 mg via INTRAVENOUS

## 2016-09-09 MED ORDER — PROPOFOL 10 MG/ML IV BOLUS
INTRAVENOUS | Status: DC | PRN
  Administered 2016-09-09: 150 mg via INTRAVENOUS

## 2016-09-09 MED ORDER — HYDROCODONE-ACETAMINOPHEN 5-300 MG PO TABS: 1.0000 | ORAL_TABLET | ORAL | 0 refills | Status: DC | PRN

## 2016-09-09 MED ORDER — SCOPOLAMINE 1 MG/3DAYS TD PT72
MEDICATED_PATCH | TRANSDERMAL | Status: AC
  Filled 2016-09-09: qty 1

## 2016-09-09 MED ORDER — CLINDAMYCIN PHOSPHATE 600 MG/50ML IV SOLN
INTRAVENOUS | Status: AC
  Filled 2016-09-09: qty 50

## 2016-09-09 MED ORDER — MUPIROCIN 2 % EX OINT
TOPICAL_OINTMENT | CUTANEOUS | Status: DC | PRN
  Administered 2016-09-09: 1 via NASAL

## 2016-09-09 MED ORDER — LIDOCAINE 2% (20 MG/ML) 5 ML SYRINGE
INTRAMUSCULAR | Status: DC | PRN
  Administered 2016-09-09: 80 mg via INTRAVENOUS

## 2016-09-09 MED ORDER — LACTATED RINGERS IV SOLN
INTRAVENOUS | Status: DC
  Administered 2016-09-09 (×2): via INTRAVENOUS

## 2016-09-09 SURGICAL SUPPLY — 56 items
ATTRACTOMAT 16X20 MAGNETIC DRP (DRAPES) IMPLANT
BLADE RAD40 ROTATE 4M 4 5PK (BLADE) IMPLANT
BLADE RAD60 ROTATE M4 4 5PK (BLADE) IMPLANT
BLADE ROTATE RAD 12 4 M4 (BLADE) IMPLANT
BLADE ROTATE RAD 40 4 M4 (BLADE) IMPLANT
BLADE ROTATE TRICUT 4X13 M4 (BLADE) ×2 IMPLANT
BLADE TRICUT ROTATE M4 4 5PK (BLADE) IMPLANT
BUR HS RAD FRONTAL 3 (BURR) IMPLANT
CANISTER SUC SOCK COL 7IN (MISCELLANEOUS) ×4 IMPLANT
CANISTER SUCT 1200ML W/VALVE (MISCELLANEOUS) ×2 IMPLANT
COAGULATOR SUCT 8FR VV (MISCELLANEOUS) ×1 IMPLANT
COAGULATOR SUCT SWTCH 10FR 6 (ELECTROSURGICAL) IMPLANT
DECANTER SPIKE VIAL GLASS SM (MISCELLANEOUS) IMPLANT
DRSG NASAL KENNEDY LMNT 8CM (GAUZE/BANDAGES/DRESSINGS) IMPLANT
DRSG NASOPORE 8CM (GAUZE/BANDAGES/DRESSINGS) IMPLANT
DRSG TELFA 3X8 NADH (GAUZE/BANDAGES/DRESSINGS) IMPLANT
ELECT REM PT RETURN 9FT ADLT (ELECTROSURGICAL) ×2
ELECTRODE REM PT RTRN 9FT ADLT (ELECTROSURGICAL) ×1 IMPLANT
GLOVE BIO SURGEON STRL SZ7.5 (GLOVE) ×1 IMPLANT
GLOVE BIOGEL PI IND STRL 6.5 (GLOVE) IMPLANT
GLOVE BIOGEL PI INDICATOR 6.5 (GLOVE) ×1
GLOVE SURG SS PI 7.5 STRL IVOR (GLOVE) ×1 IMPLANT
GOWN STRL REUS W/ TWL LRG LVL3 (GOWN DISPOSABLE) ×2 IMPLANT
GOWN STRL REUS W/TWL LRG LVL3 (GOWN DISPOSABLE) ×4
HEMOSTAT SURGICEL 2X14 (HEMOSTASIS) IMPLANT
IV NS 1000ML (IV SOLUTION)
IV NS 1000ML BAXH (IV SOLUTION) IMPLANT
IV NS 500ML (IV SOLUTION) ×2
IV NS 500ML BAXH (IV SOLUTION) ×1 IMPLANT
NDL HYPO 25X1 1.5 SAFETY (NEEDLE) ×1 IMPLANT
NDL PRECISIONGLIDE 27X1.5 (NEEDLE) ×1 IMPLANT
NDL SPNL 25GX3.5 QUINCKE BL (NEEDLE) IMPLANT
NEEDLE HYPO 25X1 1.5 SAFETY (NEEDLE) IMPLANT
NEEDLE PRECISIONGLIDE 27X1.5 (NEEDLE) IMPLANT
NEEDLE SPNL 25GX3.5 QUINCKE BL (NEEDLE) IMPLANT
NS IRRIG 1000ML POUR BTL (IV SOLUTION) ×2 IMPLANT
PACK BASIN DAY SURGERY FS (CUSTOM PROCEDURE TRAY) ×2 IMPLANT
PACK ENT DAY SURGERY (CUSTOM PROCEDURE TRAY) ×2 IMPLANT
PACKING NASAL EPIS 4X2.4 XEROG (MISCELLANEOUS) ×1 IMPLANT
PAD DRESSING TELFA 3X8 NADH (GAUZE/BANDAGES/DRESSINGS) IMPLANT
PATTIES SURGICAL .5 X3 (DISPOSABLE) IMPLANT
SLEEVE SCD COMPRESS KNEE MED (MISCELLANEOUS) ×1 IMPLANT
SOLUTION BUTLER CLEAR DIP (MISCELLANEOUS) ×2 IMPLANT
SPLINT NASAL AIRWAY SILICONE (MISCELLANEOUS) IMPLANT
SPONGE GAUZE 2X2 8PLY STRL LF (GAUZE/BANDAGES/DRESSINGS) ×2 IMPLANT
SPONGE NEURO XRAY DETECT 1X3 (DISPOSABLE) ×2 IMPLANT
SUT ETHILON 3 0 PS 1 (SUTURE) IMPLANT
SUT PLAIN 4 0 ~~LOC~~ 1 (SUTURE) IMPLANT
SYR 3ML 23GX1 SAFETY (SYRINGE) IMPLANT
TOWEL OR 17X24 6PK STRL BLUE (TOWEL DISPOSABLE) ×2 IMPLANT
TRACKER ENT INSTRUMENT (MISCELLANEOUS) ×2 IMPLANT
TRACKER ENT PATIENT (MISCELLANEOUS) ×2 IMPLANT
TUBE CONNECTING 20X1/4 (TUBING) ×2 IMPLANT
TUBE SALEM SUMP 16 FR W/ARV (TUBING) IMPLANT
TUBING STRAIGHTSHOT EPS 5PK (TUBING) ×2 IMPLANT
YANKAUER SUCT BULB TIP NO VENT (SUCTIONS) ×2 IMPLANT

## 2016-09-09 NOTE — Anesthesia Postprocedure Evaluation (Signed)
Anesthesia Post Note  Patient: Martha Moss  Procedure(s) Performed: Procedure(s) (LRB): BILATERAL TURBINATE REDUCTION (Bilateral) ENDOSCOPIC SINUS SURGERY WITH NAVIGATION (Bilateral) ETHMOIDECTOMY (Bilateral) MAXILLARY ANTROSTOMY WITH TISSUE REMOVAL (Bilateral) FRONTAL RECESS SINUS EXPLORATION (Bilateral)  Patient location during evaluation: PACU Anesthesia Type: General Level of consciousness: sedated and patient cooperative Pain management: pain level controlled Vital Signs Assessment: post-procedure vital signs reviewed and stable Respiratory status: spontaneous breathing Cardiovascular status: stable Anesthetic complications: no       Last Vitals:  Vitals:   09/09/16 1115 09/09/16 1134  BP: 126/76 133/72  Pulse: 84 82  Resp: 18 18  Temp:  36.7 C    Last Pain:  Vitals:   09/09/16 1134  TempSrc:   PainSc: 2                  Lewie LoronJohn Cornelius Marullo

## 2016-09-09 NOTE — Op Note (Signed)
DATE OF PROCEDURE: 09/09/2016  OPERATIVE REPORT   SURGEON: Martha PiesSu Eleri Ruben, MD   PREOPERATIVE DIAGNOSES:  1. Bilateral chronic rhinosinusitis. 2. Bilateral inferior turbinate hypertrophy.  3. Chronic nasal obstruction.  POSTOPERATIVE DIAGNOSES:  1. Bilateral chronic rhinosinusitis.  2. Bilateral inferior turbinate hypertrophy.  3. Chronic nasal obstruction.  PROCEDURE PERFORMED:  1. Bilateral endoscopic frontal sinusotomy. 2. Bilateral endoscopic total ethmoidectomy. 3. Bilateral endoscopic maxillary antrostomy with polyp removal. 4. Bilateral partial inferior turbinate resection.  5  FUSION stereotactic image guidance.  ANESTHESIA: General endotracheal tube anesthesia.   COMPLICATIONS: None.   ESTIMATED BLOOD LOSS: Less than 100 mL.   INDICATION FOR PROCEDURE: Martha Moss is a 51 y.o. female with a history of frequent recurrent sinusitis and chronic nasal obstruction. Over the past several years, the patient has been complaining of constant facial pressure, pain, nasal congestion. The patient was treated with multiple courses of antibiotics and steroids. She has had more than 8 courses of antibiotics this past year. On her CT scan, she was noted to have significant nasal mucosal congestion and opacification of her left frontal, ethmoid, and maxillary sinuses. The sinus drainage pathways were obstructed bilaterally. On examination, the patient was also noted to have bilateral severe inferior turbinate hypertrophy, causing significant nasal obstruction. Based on the above findings, the decision was made for the patient to undergo the above-stated procedures. The risks, benefits, alternatives, and details of the procedure were discussed with the patient. Questions were invited and answered. Informed consent was obtained.   DESCRIPTION OF PROCEDURE: The patient was taken to the operating room and placed supine on the operating table. General endotracheal tube anesthesia was administered  by the anesthesiologist. The patient was positioned, and prepped and draped in the standard fashion for nasal surgery. The FUSION image guidance marker was placed. The image guidance system was functional throughout the case. Pledgets soaked with Afrin were placed in both nasal cavities for decongestion. The pledgets were subsequently removed.   Attention was first focused on the left nasal cavity. Using a 0 scope, the left nasal cavity was examined. The left middle turbinate was medialized. Polypoid tissue was noted to fill the left middle meatus. The uncinate process was resected with a freer elevator. The maxillary antrum was entered and enlarged. Polypoid tissue was removed from the maxillary sinus. The sinus was copiously irrigated with saline solution. The left anterior and posterior ethmoid cavities were then opened with a combination of Tru-Cut forceps and microdebrider. Polypoid tissue was also removed from the ethmoid sinuses. Attention was then focused on the frontal sinus. The frontal recess was carefully enlarged using a combination of Tru-Cut forceps and microdebrider. The frontal sinus was also copiously irrigated.  The same procedure was then repeated on the right side without exception. Hemostasis was achieved with the Xerogel packing.  Attention was then focused on the inferior turbinates. Both inferior turbinates were noted to be significantly hypertrophied. The inferior one half of both hypertrophied inferior turbinate was crossclamped with a Kelly clamp. The inferior one half of each inferior turbinate was then resected with a pair of cross cutting scissors. Hemostasis was achieved with a suction cautery device.   The care of the patient was turned over to the anesthesiologist. The patient was awakened from anesthesia without difficulty. The patient was extubated and transferred to the recovery room in good condition.   OPERATIVE FINDINGS: Bilateral chronic rhinosinusitis and  polyposis,  and bilateral inferior turbinate hypertrophy.   SPECIMEN: Bilateral sinonasal contents.  FOLLOWUP CARE: The patient  be discharged home once she is awake and alert. The patient will follow up in my office in approximately.   Martha Lofton Philomena Doheny, MD

## 2016-09-09 NOTE — Anesthesia Procedure Notes (Signed)
Procedure Name: Intubation Date/Time: 09/09/2016 8:41 AM Performed by: Gar GibbonKEETON, Katee Wentland S Pre-anesthesia Checklist: Patient identified, Emergency Drugs available, Suction available and Patient being monitored Patient Re-evaluated:Patient Re-evaluated prior to inductionOxygen Delivery Method: Circle system utilized Preoxygenation: Pre-oxygenation with 100% oxygen Intubation Type: IV induction Ventilation: Mask ventilation without difficulty Laryngoscope Size: Miller and 2 Grade View: Grade III Tube type: Oral Rae Tube size: 7.0 mm Number of attempts: 1 Airway Equipment and Method: Stylet and Oral airway Placement Confirmation: ETT inserted through vocal cords under direct vision,  positive ETCO2 and breath sounds checked- equal and bilateral Secured at: 20 cm Tube secured with: Tape Dental Injury: Teeth and Oropharynx as per pre-operative assessment

## 2016-09-09 NOTE — Transfer of Care (Signed)
Immediate Anesthesia Transfer of Care Note  Patient: Martha BenesSherry L Radovich  Procedure(s) Performed: Procedure(s): BILATERAL TURBINATE REDUCTION (Bilateral) ENDOSCOPIC SINUS SURGERY WITH NAVIGATION (Bilateral) ETHMOIDECTOMY (Bilateral) MAXILLARY ANTROSTOMY WITH TISSUE REMOVAL (Bilateral) FRONTAL RECESS SINUS EXPLORATION (Bilateral)  Patient Location: PACU  Anesthesia Type:General  Level of Consciousness: awake, sedated and patient cooperative  Airway & Oxygen Therapy: Patient Spontanous Breathing and Patient connected to face mask oxygen  Post-op Assessment: Report given to RN and Post -op Vital signs reviewed and stable  Post vital signs: Reviewed and stable  Last Vitals:  Vitals:   09/09/16 0749  BP: (!) 103/56  Pulse: 71  Resp: 16  Temp: 36.9 C    Last Pain:  Vitals:   09/09/16 0749  TempSrc: Oral  PainSc: 5          Complications: No apparent anesthesia complications

## 2016-09-09 NOTE — Anesthesia Preprocedure Evaluation (Addendum)
Anesthesia Evaluation  Patient identified by MRN, date of birth, ID band Patient awake    Reviewed: Allergy & Precautions, NPO status , Patient's Chart, lab work & pertinent test results  History of Anesthesia Complications (+) PONV  Airway Mallampati: III  TM Distance: <3 FB Neck ROM: Full  Mouth opening: Limited Mouth Opening  Dental  (+) Teeth Intact, Dental Advisory Given   Pulmonary    breath sounds clear to auscultation       Cardiovascular negative cardio ROS   Rhythm:Regular Rate:Normal     Neuro/Psych  Headaches, Depression    GI/Hepatic Neg liver ROS, GERD  ,  Endo/Other  negative endocrine ROS  Renal/GU negative Renal ROS     Musculoskeletal  (+) Fibromyalgia -  Abdominal   Peds  Hematology   Anesthesia Other Findings   Reproductive/Obstetrics                            Anesthesia Physical Anesthesia Plan  ASA: II  Anesthesia Plan: General   Post-op Pain Management:    Induction: Intravenous  Airway Management Planned: Oral ETT and Video Laryngoscope Planned  Additional Equipment:   Intra-op Plan:   Post-operative Plan: Extubation in OR  Informed Consent: I have reviewed the patients History and Physical, chart, labs and discussed the procedure including the risks, benefits and alternatives for the proposed anesthesia with the patient or authorized representative who has indicated his/her understanding and acceptance.   Dental advisory given  Plan Discussed with:   Anesthesia Plan Comments:        Anesthesia Quick Evaluation

## 2016-09-09 NOTE — Discharge Instructions (Addendum)
Post Anesthesia Home Care Instructions  Activity: Get plenty of rest for the remainder of the day. A responsible adult should stay with you for 24 hours following the procedure.  For the next 24 hours, DO NOT: -Drive a car -Advertising copywriter -Drink alcoholic beverages -Take any medication unless instructed by your physician -Make any legal decisions or sign important papers.  Meals: Start with liquid foods such as gelatin or soup. Progress to regular foods as tolerated. Avoid greasy, spicy, heavy foods. If nausea and/or vomiting occur, drink only clear liquids until the nausea and/or vomiting subsides. Call your physician if vomiting continues.  Special Instructions/Symptoms: Your throat may feel dry or sore from the anesthesia or the breathing tube placed in your throat during surgery. If this causes discomfort, gargle with warm salt water. The discomfort should disappear within 24 hours.  If you had a scopolamine patch placed behind your ear for the management of post- operative nausea and/or vomiting:  1. The medication in the patch is effective for 72 hours, after which it should be removed.  Wrap patch in a tissue and discard in the trash. Wash hands thoroughly with soap and water. 2. You may remove the patch earlier than 72 hours if you experience unpleasant side effects which may include dry mouth, dizziness or visual disturbances. 3. Avoid touching the patch. Wash your hands with soap and water after contact with the patch.   Call your surgeon if you experience:   1.  Fever over 101.0. 2.  Inability to urinate. 3.  Nausea and/or vomiting. 4.  Extreme swelling or bruising at the surgical site. 5.  Continued bleeding from the incision. 6.  Increased pain, redness or drainage from the incision. 7.  Problems related to your pain medication. 8.  Any problems and/or concernsCall your surgeon if you experience:   1.  Fever over 101.0. 2.  Inability to urinate. 3.  Nausea and/or  vomiting. 4.  Extreme swelling or bruising at the surgical site. 5.  Continued bleeding from the incision. 6.  Increased pain, redness or drainage from the incision. 7.  Problems related to your pain medication. 8.  Any problems and/or concernsCall your surgeon if you experience:   1.  Fever over 101.0. 2.  Inability to urinate. 3.  Nausea and/or vomiting. 4.  Extreme swelling or bruising at the surgical site. 5.  Continued bleeding from the incision. 6.  Increased pain, redness or drainage from the incision. 7.  Problems related to your pain medication. 8.  Any problems and/or concernsCall your surgeon if you experience:   1.  Fever over 101.0. 2.  Inability to urinate. 3.  Nausea and/or vomiting. 4.  Extreme swelling or bruising at the surgical site. 5.  Continued bleeding from the incision. 6.  Increased pain, redness or drainage from the incision. 7.  Problems related to your pain medication. 8.  Any problems and/or concernsCall your surgeon if you experience:   1.  Fever over 101.0. 2.  Inability to urinate. 3.  Nausea and/or vomiting. 4.  Extreme swelling or bruising at the surgical site. 5.  Continued bleeding from the incision. 6.  Increased pain, redness or drainage from the incision. 7.  Problems related to your pain medication. 8.  Any problems and/or concerns      POSTOPERATIVE INSTRUCTIONS FOR PATIENTS HAVING NASAL OR SINUS OPERATIONS ACTIVITY: Restrict activity at home for the first two days, resting as much as possible. Light activity is best. You may usually return to work  within a week. You should refrain from nose blowing, strenuous activity, or heavy lifting greater than 20lbs for a total of three weeks after your operation.  If sneezing cannot be avoided, sneeze with your mouth open. DISCOMFORT: You may experience a dull headache and pressure along with nasal congestion and discharge. These symptoms may be worse during the first week after the operation but  may last as long as two to four weeks.  Please take Tylenol or the pain medication that has been prescribed for you. Do not take aspirin or aspirin containing medications since they may cause bleeding.  You may experience symptoms of post nasal drainage, nasal congestion, headaches and fatigue for two or three months after your operation.  BLEEDING: You may have some blood tinged nasal drainage for approximately two weeks after the operation.  The discharge will be worse for the first week.  Please call our office at [phone] or go to the nearest hospital emergency room if you experience any of the following: heavy, bright red blood from your nose or mouth that lasts longer than ten minutes or coughing up or vomiting bright red blood or blood clots. GENERAL CONSIDERATIONS: 1. A gauze dressing will be placed on your upper lip to absorb any drainage after the operation. You may need to change this several times a day.  If you do not have very much drainage, you may remove the dressing.  Remember that you may gently wipe your nose with a tissue and sniff in, but DO NOT blow your nose. 2. Please keep all of your postoperative appointments.  Your final results after the operation will depend on proper follow-up.  The initial visit is usually four to seven days after the operation.  During this visit, the remaining nasal packing and internal septal splints will be removed.  Your nasal and sinus cavities will be cleaned.  During the second visit, your nasal and sinus cavities will be cleaned again. Have someone drive you to your first two postoperative appointments. We suggest that you take your prescribed pain medication about  hour prior to each of these two appointments.  3. How you care for your nose after the operation will influence the results that you obtain.  You should follow all directions, take your medication as prescribed, and call our office [phone] with any problems or questions. 4. You  may be more comfortable sleeping with your head elevated on two pillows. 5. Do not take any medications that we have not prescribed or recommended. WARNING SIGNS: if any of the following should occur, please call our office: 1. Bright red bleeding which lasts more than 10 minutes. 2. Persistent fever greater than 102F. 3. Persistent vomiting. 4. Severe and constant pain that is not relieved by prescribed pain medication. 5. Trauma to the nose. 6. Rash or unusual side effects from any medicines.

## 2016-09-09 NOTE — H&P (Signed)
Cc: Chronic rhinosinusitis, chronic nasal congestion  HPI: The patient is a 51 year old female who returns today for her follow-up evaluation.  The patient was previously seen for recurrent sinusitis.  At her last visit, she was noted to have chronic rhinitis, edematous nasal mucosa, and a septal perforation.  The patient was previously treated with multiple courses of antibiotics and steroids.  She has had more than 8 courses of antibiotics this year.  She subsequently underwent a paranasal sinus CT scan.  The CT scan showed significant nasal mucosal congestion and opacification of her left frontal, ethmoid, and maxillary sinuses.  Her sinus drainage pathways were obstructed bilaterally, worse on the left side.  In addition, the CT also showed bilateral inferior turbinate hypertrophy, causing significant nasal obstruction. The patient is currently finishing up her latest course of antibiotics.  She is interested in more definitive treatment of her chronic condition.  She complains of persistent facial pain, especially around her periorbital area bilaterally.    Exam: The nasal cavities were decongested and anesthetised with a combination of oxymetazoline and 4% lidocaine solution.  The flexible scope was inserted into the right nasal cavity.  Endoscopy of the inferior and middle meatus was performed.  Edematous and polypoid mucosa was noted.   Olfactory cleft was clear.  Nasopharynx was clear.  Turbinates were hypertrophied but without mass.  Incomplete response to decongestion.  The procedure was repeated on the contralateral side with similar findings.  The patient tolerated the procedure well.  Instructions were given to avoid eating or drinking for 2 hours.     Assessment: 1.  Chronic bilateral rhinosinusitis, involving her frontal, maxillary and ethmoid sinuses.  2.  Bilateral inferior turbinate hypertrophy, causing significant nasal obstruction.   Plan: 1.  The nasal endoscopy findings and the  CT images are extensively discussed with the patient.  2.  The treatment options are also reviewed.  The options include continuing medical treatment versus surgical intervention with revision sinus surgery, involving her bilateral maxillary, ethmoid and frontal sinuses.  She may also benefit from undergoing bilateral turbinate reduction.  The risks, benefits, alternatives and details of the procedure are reviewed.   3.  The patient would like to proceed with the procedures.

## 2016-09-10 ENCOUNTER — Encounter (HOSPITAL_BASED_OUTPATIENT_CLINIC_OR_DEPARTMENT_OTHER): Payer: Self-pay | Admitting: Otolaryngology

## 2016-09-15 ENCOUNTER — Ambulatory Visit (INDEPENDENT_AMBULATORY_CARE_PROVIDER_SITE_OTHER): Payer: BLUE CROSS/BLUE SHIELD | Admitting: Otolaryngology

## 2016-09-15 DIAGNOSIS — J321 Chronic frontal sinusitis: Secondary | ICD-10-CM

## 2016-09-15 DIAGNOSIS — J320 Chronic maxillary sinusitis: Secondary | ICD-10-CM

## 2016-09-15 DIAGNOSIS — J322 Chronic ethmoidal sinusitis: Secondary | ICD-10-CM

## 2016-09-25 ENCOUNTER — Encounter: Payer: Self-pay | Admitting: Family Medicine

## 2016-09-25 ENCOUNTER — Ambulatory Visit (INDEPENDENT_AMBULATORY_CARE_PROVIDER_SITE_OTHER): Payer: BLUE CROSS/BLUE SHIELD | Admitting: Family Medicine

## 2016-09-25 VITALS — BP 102/78 | HR 77 | Temp 98.7°F | Resp 16 | Ht 62.0 in | Wt 128.0 lb

## 2016-09-25 DIAGNOSIS — Z1231 Encounter for screening mammogram for malignant neoplasm of breast: Secondary | ICD-10-CM

## 2016-09-25 DIAGNOSIS — Z1211 Encounter for screening for malignant neoplasm of colon: Secondary | ICD-10-CM | POA: Diagnosis not present

## 2016-09-25 DIAGNOSIS — Z0000 Encounter for general adult medical examination without abnormal findings: Secondary | ICD-10-CM

## 2016-09-25 DIAGNOSIS — Z23 Encounter for immunization: Secondary | ICD-10-CM | POA: Diagnosis not present

## 2016-09-25 LAB — HEPATIC FUNCTION PANEL
ALK PHOS: 62 U/L (ref 33–130)
ALT: 14 U/L (ref 6–29)
AST: 23 U/L (ref 10–35)
Albumin: 4.1 g/dL (ref 3.6–5.1)
BILIRUBIN INDIRECT: 0.3 mg/dL (ref 0.2–1.2)
Bilirubin, Direct: 0.1 mg/dL (ref <=0.2)
TOTAL PROTEIN: 6.9 g/dL (ref 6.1–8.1)
Total Bilirubin: 0.4 mg/dL (ref 0.2–1.2)

## 2016-09-25 LAB — LIPID PANEL
CHOLESTEROL: 264 mg/dL — AB (ref <200)
HDL: 57 mg/dL (ref >50)
LDL Cholesterol: 178 mg/dL — ABNORMAL HIGH (ref <100)
Total CHOL/HDL Ratio: 4.6 Ratio (ref <5.0)
Triglycerides: 147 mg/dL (ref <150)
VLDL: 29 mg/dL (ref <30)

## 2016-09-25 LAB — CBC WITH DIFFERENTIAL/PLATELET
BASOS ABS: 0 {cells}/uL (ref 0–200)
BASOS PCT: 0 %
EOS ABS: 120 {cells}/uL (ref 15–500)
Eosinophils Relative: 2 %
HEMATOCRIT: 33.8 % — AB (ref 35.0–45.0)
Hemoglobin: 11.2 g/dL — ABNORMAL LOW (ref 11.7–15.5)
LYMPHS PCT: 52 %
Lymphs Abs: 3120 cells/uL (ref 850–3900)
MCH: 30.5 pg (ref 27.0–33.0)
MCHC: 33.1 g/dL (ref 32.0–36.0)
MCV: 92.1 fL (ref 80.0–100.0)
MONO ABS: 480 {cells}/uL (ref 200–950)
MPV: 9.9 fL (ref 7.5–12.5)
Monocytes Relative: 8 %
Neutro Abs: 2280 cells/uL (ref 1500–7800)
Neutrophils Relative %: 38 %
Platelets: 400 10*3/uL (ref 140–400)
RBC: 3.67 MIL/uL — ABNORMAL LOW (ref 3.80–5.10)
RDW: 13.6 % (ref 11.0–15.0)
WBC: 6 10*3/uL (ref 3.8–10.8)

## 2016-09-25 LAB — BASIC METABOLIC PANEL
BUN: 20 mg/dL (ref 7–25)
CHLORIDE: 110 mmol/L (ref 98–110)
CO2: 23 mmol/L (ref 20–31)
Calcium: 9.5 mg/dL (ref 8.6–10.4)
Creat: 0.77 mg/dL (ref 0.50–1.05)
GLUCOSE: 88 mg/dL (ref 65–99)
POTASSIUM: 4.5 mmol/L (ref 3.5–5.3)
SODIUM: 142 mmol/L (ref 135–146)

## 2016-09-25 LAB — TSH: TSH: 1.49 m[IU]/L

## 2016-09-25 MED ORDER — PANTOPRAZOLE SODIUM 40 MG PO TBEC: 40.0000 mg | DELAYED_RELEASE_TABLET | Freq: Every day | ORAL | 3 refills | Status: DC

## 2016-09-25 NOTE — Progress Notes (Signed)
   Subjective:    Patient ID: Martha Moss, female    DOB: 01/22/1966, 51 y.o.   MRN: [id number]  HPI CPE- due for mammo, colonoscopy, flu shot.   Review of Systems Patient reports no vision/ hearing changes, adenopathy,fever, weight change,  persistant/recurrent hoarseness , swallowing issues, chest pain, palpitations, edema, persistant/recurrent cough, hemoptysis, dyspnea (rest/exertional/paroxysmal nocturnal), gastrointestinal bleeding (melena, rectal bleeding), abdominal pain, bowel changes, GU symptoms (dysuria, hematuria, incontinence),  syncope, focal weakness, memory loss, numbness & tingling, skin/hair/nail changes, abnormal bruising or bleeding, anxiety, or depression.   + GERD- not relieved w/ OTC Omeprazole twice daily + pain w/ intercourse    Objective:   Physical Exam General Appearance:    Alert, cooperative, no distress, appears stated age  Head:    Normocephalic, without obvious abnormality, atraumatic  Eyes:    PERRL, conjunctiva/corneas clear, EOM's intact, fundi    benign, both eyes  Ears:    Normal TM's and external ear canals, both ears  Nose:   Nares normal, septum midline, mucosa normal, no drainage    or sinus tenderness  Throat:   Lips, mucosa, and tongue normal; teeth and gums normal  Neck:   Supple, symmetrical, trachea midline, no adenopathy;    Thyroid: no enlargement/tenderness/nodules  Back:     Symmetric, no curvature, ROM normal, no CVA tenderness  Lungs:     Clear to auscultation bilaterally, respirations unlabored  Chest Wall:    No tenderness or deformity   Heart:    Regular rate and rhythm, S1 and S2 normal, no murmur, rub   or gallop  Breast Exam:    Deferred to GYN  Abdomen:     Soft, non-tender, bowel sounds active all four quadrants,    no masses, no organomegaly  Genitalia:    Deferred to GYN  Rectal:    Extremities:   Extremities normal, atraumatic, no cyanosis or edema  Pulses:   2+ and symmetric all extremities  Skin:   Skin  color, texture, turgor normal, no rashes or lesions  Lymph nodes:   Cervical, supraclavicular, and axillary nodes normal  Neurologic:   CNII-XII intact, normal strength, sensation and reflexes    throughout          Assessment & Plan:

## 2016-09-25 NOTE — Progress Notes (Signed)
Pre visit review using our clinic review tool, if applicable. No additional management support is needed unless otherwise documented below in the visit note. 

## 2016-09-25 NOTE — Patient Instructions (Signed)
Follow up in 1 year or as needed We'll notify you of your lab results and make any changes if needed Continue to work on healthy diet and regular exercise- you look great!! We'll call you with your mammo appt, GI referral STOP the Omeprazole, START the Protonix once daily for the reflux Call and schedule a GYN appt Call with any questions or concerns Happy Spring!!!

## 2016-09-25 NOTE — Assessment & Plan Note (Signed)
Pt's PE WNL.  Due for mammo and colonoscopy- orders entered.  Flu shot given today.  Pt plans to schedule w/ GYN.  Check labs.  Anticipatory guidance provided.

## 2016-09-26 ENCOUNTER — Telehealth: Payer: Self-pay | Admitting: Family Medicine

## 2016-09-26 ENCOUNTER — Other Ambulatory Visit: Payer: Self-pay | Admitting: *Deleted

## 2016-09-26 DIAGNOSIS — E785 Hyperlipidemia, unspecified: Secondary | ICD-10-CM

## 2016-09-26 LAB — VITAMIN D 25 HYDROXY (VIT D DEFICIENCY, FRACTURES): Vit D, 25-Hydroxy: 50 ng/mL (ref 30–100)

## 2016-09-26 MED ORDER — ATORVASTATIN CALCIUM 20 MG PO TABS: 20.0000 mg | ORAL_TABLET | Freq: Every day | ORAL | 1 refills | Status: DC

## 2016-09-26 NOTE — Telephone Encounter (Signed)
Please call patient back Re lab results

## 2016-09-26 NOTE — Telephone Encounter (Signed)
See documentation in result notes. 

## 2016-10-02 ENCOUNTER — Ambulatory Visit (INDEPENDENT_AMBULATORY_CARE_PROVIDER_SITE_OTHER): Payer: BLUE CROSS/BLUE SHIELD | Admitting: Otolaryngology

## 2016-10-09 ENCOUNTER — Ambulatory Visit (INDEPENDENT_AMBULATORY_CARE_PROVIDER_SITE_OTHER): Payer: BLUE CROSS/BLUE SHIELD | Admitting: Otolaryngology

## 2016-10-09 DIAGNOSIS — J320 Chronic maxillary sinusitis: Secondary | ICD-10-CM

## 2016-10-09 DIAGNOSIS — J321 Chronic frontal sinusitis: Secondary | ICD-10-CM

## 2016-10-09 DIAGNOSIS — J322 Chronic ethmoidal sinusitis: Secondary | ICD-10-CM | POA: Diagnosis not present

## 2016-10-20 ENCOUNTER — Other Ambulatory Visit: Payer: Self-pay | Admitting: Family Medicine

## 2016-10-20 DIAGNOSIS — Z1231 Encounter for screening mammogram for malignant neoplasm of breast: Secondary | ICD-10-CM

## 2016-10-28 ENCOUNTER — Encounter: Payer: Self-pay | Admitting: Family Medicine

## 2016-10-29 ENCOUNTER — Other Ambulatory Visit: Payer: Self-pay | Admitting: Family Medicine

## 2016-10-29 DIAGNOSIS — N6001 Solitary cyst of right breast: Secondary | ICD-10-CM

## 2016-10-31 ENCOUNTER — Telehealth: Payer: Self-pay | Admitting: Family Medicine

## 2016-10-31 MED ORDER — ROSUVASTATIN CALCIUM 20 MG PO TABS: 20.0000 mg | ORAL_TABLET | Freq: Every day | ORAL | 6 refills | Status: DC

## 2016-10-31 NOTE — Telephone Encounter (Signed)
Pt states that she was taking lipitor and started to have muscle pain making it hard to walk. Pt stopped the Rx and now feels better. Pt asking if she needs to start back on this or if something else could be called into pharmacy for her, [facility]

## 2016-10-31 NOTE — Telephone Encounter (Signed)
Rather than restarting Lipitor, we can try Crestor 20mg  daily and take it w/ CoEnzyme Q10 to decrease risk of muscle aches

## 2016-10-31 NOTE — Telephone Encounter (Signed)
Called pt and left a detailed message on voicemail to inform of PCP recommendations. New prescription filled to local pharmacy.

## 2016-11-11 ENCOUNTER — Telehealth: Payer: Self-pay | Admitting: *Deleted

## 2016-11-11 NOTE — Telephone Encounter (Signed)
PA started through Cover My Meds.   KEY [id number]  Awaiting authorization from [facility]

## 2016-11-11 NOTE — Telephone Encounter (Signed)
Received a fax from [facility] that medication does not need prior authorization.   Contacted the pharmacy and the medication did go through - the request was sent in error.

## 2016-11-20 ENCOUNTER — Encounter: Payer: Self-pay | Admitting: General Practice

## 2016-11-20 ENCOUNTER — Telehealth: Payer: Self-pay | Admitting: *Deleted

## 2016-11-20 ENCOUNTER — Other Ambulatory Visit (INDEPENDENT_AMBULATORY_CARE_PROVIDER_SITE_OTHER): Payer: BLUE CROSS/BLUE SHIELD

## 2016-11-20 DIAGNOSIS — E785 Hyperlipidemia, unspecified: Secondary | ICD-10-CM

## 2016-11-20 LAB — LIPID PANEL
Cholesterol: 158 mg/dL (ref 0–200)
HDL: 62.3 mg/dL (ref >39.00)
LDL CALC: 79 mg/dL (ref 0–99)
NONHDL: 95.54
Total CHOL/HDL Ratio: 3
Triglycerides: 81 mg/dL (ref 0.0–149.0)
VLDL: 16.2 mg/dL (ref 0.0–40.0)

## 2016-11-20 LAB — HEPATIC FUNCTION PANEL
ALT: 12 U/L (ref 0–35)
AST: 14 U/L (ref 0–37)
Albumin: 4.6 g/dL (ref 3.5–5.2)
Alkaline Phosphatase: 68 U/L (ref 39–117)
BILIRUBIN TOTAL: 0.3 mg/dL (ref 0.2–1.2)
Bilirubin, Direct: 0.1 mg/dL (ref 0.0–0.3)
Total Protein: 7.1 g/dL (ref 6.0–8.3)

## 2016-11-20 NOTE — Telephone Encounter (Signed)
Patient states that since switching to Crestor she is feeling sick nearly all the time.  She said it really messes up her stomach and she is dealing with constant nausea since starting this medication.    She is asking if there is anything else that she can take, or if there are suggestions on how to deal with these symptoms.

## 2016-11-20 NOTE — Telephone Encounter (Signed)
Pt states that she is taking the medication with food.  She states that she would like a referral to the [facility]id Clinic to see if they can help her.

## 2016-11-20 NOTE — Telephone Encounter (Signed)
Referral placed at pt request.

## 2016-11-20 NOTE — Addendum Note (Signed)
Addended by: Geannie Risen on: 11/20/2016 02:15 PM   Modules accepted: Orders

## 2016-11-20 NOTE — Telephone Encounter (Signed)
Called pt and left a detailed message to inform of PCP recommendations. Will await pt return call to see if she would like a referral to lipid clinic.

## 2016-11-20 NOTE — Telephone Encounter (Signed)
1st would be to take meds w/ food.  If this doesn't improve her nausea, I would recommend a referral to Lipid Clinic since she had difficulty w/ Lipitor in the past.

## 2016-11-21 ENCOUNTER — Other Ambulatory Visit: Payer: BLUE CROSS/BLUE SHIELD

## 2016-12-19 ENCOUNTER — Other Ambulatory Visit: Payer: Self-pay | Admitting: Family Medicine

## 2016-12-19 DIAGNOSIS — N6009 Solitary cyst of unspecified breast: Secondary | ICD-10-CM

## 2016-12-29 ENCOUNTER — Telehealth: Payer: Self-pay | Admitting: General Practice

## 2016-12-29 NOTE — Telephone Encounter (Signed)
-----   Message from Sheliah HatchKatherine E Tabori, MD sent at 12/29/2016  9:54 AM EDT ----- Regarding: FW: Lipids Can we convert this to a phone note so it stays in her chart.  Also, we will try Crestor 5mg  nightly and see if she is able to tolerate this better than the higher dose.  Thanks! KT ----- Message ----- From: Awilda MetroSupple, Megan E, [facility] Sent: 12/29/2016   8:46 AM To: Sheliah HatchKatherine E Tabori, MD Subject: Lipids                                         Hi Dr Beverely Lowabori,  I noticed that Ms Malen GauzeBlankenship was scheduled in lipid clinic for follow up. Unfortunately, I am unable to see her unless she is also a patient of one of our cardiologists. I do not see any compelling cardiac issues that would require her to be seen by one of our MDs. Since I do not want her to pay for an additional specialty appt with a cardiologist if she does not need one, I am unable to see her.  However, I took a look through her history and wanted to send along my thoughts. Since she has minimal cardiac risk factors, I would target an LDL < 130mg /dL. Given her previous intolerances to Lipitor 20mg  (myalgias) and Crestor 20mg  (nausea), there are a few different options to pursue. Lower dosing/frequency tends to be tolerated better - I would recommend that she try either pravastatin 20mg  HS or Crestor 5mg  daily with option to cut back on frequency to 3x per week if needed. If she is unable to tolerate either of these, I would try Zetia 10mg  daily, however would prefer trial of statins first due to better outcomes data.  I hope this helps, Aundra MilletMegan

## 2016-12-30 ENCOUNTER — Ambulatory Visit: Payer: BLUE CROSS/BLUE SHIELD

## 2016-12-30 ENCOUNTER — Ambulatory Visit (INDEPENDENT_AMBULATORY_CARE_PROVIDER_SITE_OTHER): Payer: BLUE CROSS/BLUE SHIELD | Admitting: Obstetrics

## 2016-12-30 ENCOUNTER — Encounter: Payer: Self-pay | Admitting: Obstetrics

## 2016-12-30 ENCOUNTER — Other Ambulatory Visit: Payer: Self-pay | Admitting: Obstetrics

## 2016-12-30 VITALS — BP 109/71 | HR 81 | Ht 62.0 in | Wt 128.6 lb

## 2016-12-30 DIAGNOSIS — R8290 Unspecified abnormal findings in urine: Secondary | ICD-10-CM

## 2016-12-30 DIAGNOSIS — R109 Unspecified abdominal pain: Secondary | ICD-10-CM

## 2016-12-30 DIAGNOSIS — R102 Pelvic and perineal pain: Secondary | ICD-10-CM

## 2016-12-30 DIAGNOSIS — Z01419 Encounter for gynecological examination (general) (routine) without abnormal findings: Secondary | ICD-10-CM

## 2016-12-30 DIAGNOSIS — N6009 Solitary cyst of unspecified breast: Secondary | ICD-10-CM

## 2016-12-30 DIAGNOSIS — N941 Unspecified dyspareunia: Secondary | ICD-10-CM

## 2016-12-30 LAB — POCT URINALYSIS DIPSTICK
Bilirubin, UA: NEGATIVE
GLUCOSE UA: NEGATIVE
KETONES UA: NEGATIVE
Leukocytes, UA: NEGATIVE
Nitrite, UA: NEGATIVE
Protein, UA: NEGATIVE
RBC UA: NEGATIVE
SPEC GRAV UA: 1.015 (ref 1.010–1.025)
UROBILINOGEN UA: 0.2 U/dL
pH, UA: 6 (ref 5.0–8.0)

## 2016-12-30 MED ORDER — ROSUVASTATIN CALCIUM 5 MG PO TABS: 5.0000 mg | ORAL_TABLET | Freq: Every day | ORAL | 6 refills | Status: DC

## 2016-12-30 NOTE — Telephone Encounter (Signed)
Spoke with pt and advised of message, she expressed an understanding. New rx sent to local pharmacy and pt will advise if she has any side effects.

## 2016-12-30 NOTE — Progress Notes (Signed)
Patient ID: Martha BenesSherry L Grant, female   DOB: 08/24/1965, 51 y.o.   MRN: [id number]  Chief Complaint  Patient presents with  . Dyspareunia    HPI Martha Moss is a 51 y.o. female.  Pelvic pain for ~ 3 months and pain with intercourse.  PSH is significant for TVH, Ovarian Cystectomy.  PMH is significant for IC, depression and chronic constipation. HPI  Past Medical History:  Diagnosis Date  . Depression   . Fibromyalgia   . GERD (gastroesophageal reflux disease)   . Migraine   . PONV (postoperative nausea and vomiting)     Past Surgical History:  Procedure Laterality Date  . CHOLECYSTECTOMY    . ETHMOIDECTOMY Bilateral 09/09/2016   Procedure: ETHMOIDECTOMY;  Surgeon: Newman PiesSu Teoh, MD;  Location: [facility];  Service: ENT;  Laterality: Bilateral;  . FRONTAL SINUS EXPLORATION Bilateral 09/09/2016   Procedure: FRONTAL RECESS SINUS EXPLORATION;  Surgeon: Newman PiesSu Teoh, MD;  Location: [facility];  Service: ENT;  Laterality: Bilateral;  . MAXILLARY ANTROSTOMY Bilateral 09/09/2016   Procedure: MAXILLARY ANTROSTOMY WITH TISSUE REMOVAL;  Surgeon: Newman PiesSu Teoh, MD;  Location: [facility];  Service: ENT;  Laterality: Bilateral;  . OVARIAN CYST SURGERY    . SINUS ENDO W/FUSION Bilateral 09/09/2016   Procedure: ENDOSCOPIC SINUS SURGERY WITH NAVIGATION;  Surgeon: Newman PiesSu Teoh, MD;  Location: [facility];  Service: ENT;  Laterality: Bilateral;  . TOTAL VAGINAL HYSTERECTOMY    . TURBINATE REDUCTION Bilateral 09/09/2016   Procedure: BILATERAL TURBINATE REDUCTION;  Surgeon: Newman PiesSu Teoh, MD;  Location: [facility];  Service: ENT;  Laterality: Bilateral;    Family History  Problem Relation Age of Onset  . Kidney Stones Daughter   . Heart attack Father 434  . Diabetes Maternal Grandmother     Social History Social History  Substance Use Topics  . Smoking status: Never Smoker  . Smokeless tobacco: Never Used  . Alcohol use Yes     Comment:  rarely    Allergies  Allergen Reactions  . Bee Venom   . Azithromycin     REACTION: hives  . Diphenhydramine Hcl     REACTION: hives  . Erythromycin     REACTION: hives  . Latex     REACTION: rash  . Morphine And Related Nausea And Vomiting  . Penicillins     REACTION: rash, fever  . Percocet [Oxycodone-Acetaminophen] Other (See Comments)    Hallucinations   . Sulfonamide Derivatives     REACTION: rash, fever  . Tetracyclines & Related     Current Outpatient Prescriptions  Medication Sig Dispense Refill  . buPROPion (WELLBUTRIN XL) 300 MG 24 hr tablet Take 1 tablet (300 mg total) by mouth daily. 90 tablet 1  . fexofenadine (ALLEGRA ALLERGY) 180 MG tablet Take 180 mg by mouth daily.    . magnesium 30 MG tablet Take 30 mg by mouth every other day.    . Multiple Vitamin (MULTIVITAMIN WITH MINERALS) TABS tablet Take 1 tablet by mouth daily.    Marland Kitchen. topiramate (TOPAMAX) 200 MG tablet TAKE 1 TABLET BY MOUTH 2 TIMES DAILY. 60 tablet 6  . traZODone (DESYREL) 50 MG tablet TAKE 1/2 TO 1 TABLET BY MOUTH AT BEDTIMEAS NEEDED FOR SLEEP 30 tablet 3  . vitamin E 1000 UNIT capsule Take 1,000 Units by mouth daily.     No current facility-administered medications for this visit.     Review of Systems Review of Systems Constitutional: negative for fatigue  and weight loss Respiratory: negative for cough and wheezing Cardiovascular: negative for chest pain, fatigue and palpitations Gastrointestinal: negative for abdominal pain and change in bowel habits Genitourinary:positive for pelvic pain and pain with intercourse Integument/breast: negative for nipple discharge Musculoskeletal:negative for myalgias Neurological: negative for gait problems and tremors Behavioral/Psych: negative for abusive relationship, depression Endocrine: negative for temperature intolerance      Blood pressure 109/71, pulse 81, height 5\' 2"  (1.575 m), weight 128 lb 9.6 oz (58.3 kg).  Physical Exam Physical  Exam General:   alert  Skin:   no rash or abnormalities  Lungs:   clear to auscultation bilaterally  Heart:   regular rate and rhythm, S1, S2 normal, no murmur, click, rub or gallop  Breasts:   normal without suspicious masses, skin or nipple changes or axillary nodes  Abdomen:  normal findings: no organomegaly, soft, non-tender and no hernia  Pelvis:  External genitalia: normal general appearance Urinary system: urethral meatus normal and bladder without fullness, nontender Vaginal: normal without tenderness, induration or masses Cervix: absent Adnexa: fullness right , tender Uterus: absent    50% of 15 min visit spent on counseling and coordination of care.    Data Reviewed Labs  Assessment     Pelvic pain Dyspareunia    Plan    Pelvic Ultrasound ordered, R/O right ovarian cyst F/U in 2 weeks  Orders Placed This Encounter  Procedures  . US Pelvis Complete    Standing Status:   Future    Standing Expiration Date:   03/01/2018    Order Specific Question:   Reason for Exam (SYMPTOM  OR DIAGNOSIS REQUIRED)    Answer:   Pelvic pain    Order Specific Question:   Preferred imaging location?    Answer:   [facility]  . US Transvaginal Non-OB    Standing Status:   Future    Standing Expiration Date:   03/01/2018    Order Specific Question:   Reason for Exam (SYMPTOM  OR DIAGNOSIS REQUIRED)    Answer:   Pelvic pain    Order Specific Question:   Preferred imaging location?    Answer:   [facility]  . Ambulatory referral to Urogynecology    Referral Priority:   Routine    Referral Type:   Consultation    Referral Reason:   Specialty Services Required    Requested Specialty:   Urology    Number of Visits Requested:   1  . POCT Urinalysis Dipstick   No orders of the defined types were placed in this encounter.

## 2016-12-30 NOTE — Progress Notes (Signed)
Hysterectomy

## 2016-12-30 NOTE — Addendum Note (Signed)
Addended by: Geannie RisenBRODMERKEL, JESSICA L on: 12/30/2016 01:55 PM   Modules accepted: Orders

## 2016-12-30 NOTE — Telephone Encounter (Signed)
Called patient and LMOVM to return call.     

## 2017-01-09 ENCOUNTER — Ambulatory Visit (HOSPITAL_COMMUNITY)
Admission: RE | Admit: 2017-01-09 | Discharge: 2017-01-09 | Disposition: A | Discharge status: Home / Self Care | Payer: Self-pay | Source: Ambulatory Visit | Attending: Obstetrics | Admitting: Obstetrics

## 2017-01-09 DIAGNOSIS — Z90710 Acquired absence of both cervix and uterus: Secondary | ICD-10-CM | POA: Insufficient documentation

## 2017-01-09 DIAGNOSIS — R102 Pelvic and perineal pain: Secondary | ICD-10-CM | POA: Insufficient documentation

## 2017-01-12 ENCOUNTER — Telehealth: Payer: Self-pay | Admitting: *Deleted

## 2017-01-12 NOTE — Telephone Encounter (Signed)
Spoke with pt regarding referral and insurance. [facility].  Pt has questions about recent u/s, ?results. Pt made aware that message to be sent to provider for review and to call results to pt.   Please review latest u/s and call pt with results.  Pt has not yet been scheduled for follow up as noted in last visit.

## 2017-01-13 ENCOUNTER — Other Ambulatory Visit: Payer: Self-pay | Admitting: Obstetrics

## 2017-01-16 ENCOUNTER — Other Ambulatory Visit: Payer: Self-pay | Admitting: Family Medicine

## 2017-01-20 ENCOUNTER — Telehealth: Payer: Self-pay

## 2017-01-20 NOTE — Telephone Encounter (Signed)
Pt called this morning and left a message wanting to know if she could restart taking Celexa. Pt would like this sent in to the pharmacy. Please advise

## 2017-01-21 ENCOUNTER — Other Ambulatory Visit: Payer: Self-pay | Admitting: Obstetrics

## 2017-01-21 DIAGNOSIS — F329 Major depressive disorder, single episode, unspecified: Secondary | ICD-10-CM

## 2017-01-21 MED ORDER — CITALOPRAM HYDROBROMIDE 20 MG PO TABS: 20.0000 mg | ORAL_TABLET | Freq: Every day | ORAL | 11 refills | Status: DC

## 2017-01-21 NOTE — Telephone Encounter (Signed)
Pt informed

## 2017-01-21 NOTE — Telephone Encounter (Signed)
Celexa Rx 

## 2017-02-12 ENCOUNTER — Telehealth: Payer: Self-pay | Admitting: *Deleted

## 2017-02-12 ENCOUNTER — Other Ambulatory Visit: Payer: Self-pay | Admitting: Obstetrics

## 2017-02-12 DIAGNOSIS — N941 Unspecified dyspareunia: Secondary | ICD-10-CM

## 2017-02-12 MED ORDER — OSPEMIFENE 60 MG PO TABS: 1.0000 | ORAL_TABLET | Freq: Every day | ORAL | 11 refills | Status: DC

## 2017-02-12 NOTE — Telephone Encounter (Signed)
Pt husband called to office stating that the samples of Osphena are working well for her. Would like to know if she could get more samples or Rx to be sent to pharmacy.  Call placed back and spoke with pt husband Made aware that samples could be left at front desk for pt along with discount card for Rx if approved by provider since pt is uninsured.    Please send Rx for Osphena to pharmacy if approved.

## 2017-02-14 ENCOUNTER — Other Ambulatory Visit: Payer: Self-pay | Admitting: Obstetrics

## 2017-04-08 ENCOUNTER — Ambulatory Visit (INDEPENDENT_AMBULATORY_CARE_PROVIDER_SITE_OTHER): Payer: Self-pay | Admitting: Family Medicine

## 2017-04-08 ENCOUNTER — Encounter: Payer: Self-pay | Admitting: Family Medicine

## 2017-04-08 VITALS — BP 112/74 | HR 90 | Temp 98.1°F | Resp 17 | Ht 62.0 in | Wt 131.1 lb

## 2017-04-08 DIAGNOSIS — J0101 Acute recurrent maxillary sinusitis: Secondary | ICD-10-CM

## 2017-04-08 MED ORDER — FLUCONAZOLE 150 MG PO TABS: 150.0000 mg | ORAL_TABLET | Freq: Once | ORAL | 0 refills | Status: AC

## 2017-04-08 MED ORDER — LEVOFLOXACIN 500 MG PO TABS: 500.0000 mg | ORAL_TABLET | Freq: Every day | ORAL | 0 refills | Status: DC

## 2017-04-08 NOTE — Patient Instructions (Signed)
Follow up as needed/scheduled Start the Levaquin daily x10 days- take w/ food Drink plenty of fluids REST! Call with any questions or concerns Hang in there!!!

## 2017-04-08 NOTE — Progress Notes (Signed)
Pre visit review using our clinic review tool, if applicable. No additional management support is needed unless otherwise documented below in the visit note. 

## 2017-04-08 NOTE — Assessment & Plan Note (Signed)
Pt has hx of recurrent sinus infxns but has been doing well since her sinus surgery last winter.  sxs and PE are consistent w/ infxn.  Start abx.  Reviewed supportive care and red flags that should prompt return.  Pt expressed understanding and is in agreement w/ plan.

## 2017-04-08 NOTE — Progress Notes (Signed)
   Subjective:    Patient ID: Martha Moss, female    DOB: 07/28/1966, 51 y.o.   MRN: [id number]  HPI URI- 'i think I might have a sinus infxn'.  sxs started 'over 10 days ago'.  HA started 4-5 days ago.  Taking tylenol sinus and mucinex w/o relief.  No fevers.  + maxillary sinus pain and pain behind eyes- 'i feel like my eyes are going to pop out of my head'.  Some blurry vision last night.  Mild ear pressure.  Mild cough- dry.  No known sick contacts.   Review of Systems For ROS see HPI     Objective:   Physical Exam  Constitutional: She appears well-developed and well-nourished. No distress.  HENT:  Head: Normocephalic and atraumatic.  Right Ear: Tympanic membrane normal.  Left Ear: Tympanic membrane normal.  Nose: Mucosal edema and rhinorrhea present. Right sinus exhibits maxillary sinus tenderness. Right sinus exhibits no frontal sinus tenderness. Left sinus exhibits maxillary sinus tenderness. Left sinus exhibits no frontal sinus tenderness.  Mouth/Throat: Uvula is midline and mucous membranes are normal. Posterior oropharyngeal erythema present. No oropharyngeal exudate.  Eyes: Pupils are equal, round, and reactive to light. Conjunctivae and EOM are normal.  Neck: Normal range of motion. Neck supple.  Cardiovascular: Normal rate, regular rhythm and normal heart sounds.   Pulmonary/Chest: Effort normal and breath sounds normal. No respiratory distress. She has no wheezes.  Lymphadenopathy:    She has no cervical adenopathy.  Vitals reviewed.         Assessment & Plan:

## 2017-04-14 ENCOUNTER — Other Ambulatory Visit: Payer: Self-pay | Admitting: Family Medicine

## 2017-05-07 ENCOUNTER — Ambulatory Visit (INDEPENDENT_AMBULATORY_CARE_PROVIDER_SITE_OTHER): Payer: Self-pay | Admitting: Otolaryngology

## 2017-05-07 DIAGNOSIS — J320 Chronic maxillary sinusitis: Secondary | ICD-10-CM

## 2017-05-07 DIAGNOSIS — J310 Chronic rhinitis: Secondary | ICD-10-CM

## 2017-05-14 ENCOUNTER — Other Ambulatory Visit: Payer: Self-pay | Admitting: Family Medicine

## 2017-05-15 ENCOUNTER — Telehealth: Payer: Self-pay | Admitting: Family Medicine

## 2017-05-15 NOTE — Telephone Encounter (Signed)
Patient calling back to check status.  I advised pt of provider's recommendation.

## 2017-05-15 NOTE — Telephone Encounter (Signed)
Pt taking Azostandard to help with discomfort. Pt says UTI symptom urgency burning frequency afebrile three days. Pt in [facility] until Monday. Can you call in something? [facility] in [facility]. Please call pt and advise.

## 2017-05-15 NOTE — Telephone Encounter (Signed)
Dr. Beverely Low is out of office this week. I am covering for her. I am not comfortable writing for an antibiotic not seeing patient. She will need to go to an Urgent Care.

## 2017-07-06 ENCOUNTER — Other Ambulatory Visit: Payer: Self-pay | Admitting: Family Medicine

## 2017-08-22 ENCOUNTER — Other Ambulatory Visit: Payer: Self-pay | Admitting: Family Medicine

## 2017-08-23 ENCOUNTER — Emergency Department (HOSPITAL_BASED_OUTPATIENT_CLINIC_OR_DEPARTMENT_OTHER)
Admission: EM | Admit: 2017-08-23 | Discharge: 2017-08-23 | Disposition: A | Discharge status: Home / Self Care | Payer: No Typology Code available for payment source | Attending: Emergency Medicine | Admitting: Emergency Medicine

## 2017-08-23 ENCOUNTER — Encounter (HOSPITAL_BASED_OUTPATIENT_CLINIC_OR_DEPARTMENT_OTHER): Payer: Self-pay | Admitting: *Deleted

## 2017-08-23 ENCOUNTER — Other Ambulatory Visit: Payer: Self-pay

## 2017-08-23 ENCOUNTER — Emergency Department (HOSPITAL_BASED_OUTPATIENT_CLINIC_OR_DEPARTMENT_OTHER): Payer: No Typology Code available for payment source

## 2017-08-23 DIAGNOSIS — Z79899 Other long term (current) drug therapy: Secondary | ICD-10-CM | POA: Insufficient documentation

## 2017-08-23 DIAGNOSIS — R110 Nausea: Secondary | ICD-10-CM | POA: Insufficient documentation

## 2017-08-23 DIAGNOSIS — R6883 Chills (without fever): Secondary | ICD-10-CM | POA: Insufficient documentation

## 2017-08-23 DIAGNOSIS — R109 Unspecified abdominal pain: Secondary | ICD-10-CM | POA: Insufficient documentation

## 2017-08-23 DIAGNOSIS — Z91040 Latex allergy status: Secondary | ICD-10-CM | POA: Insufficient documentation

## 2017-08-23 DIAGNOSIS — R1031 Right lower quadrant pain: Secondary | ICD-10-CM | POA: Insufficient documentation

## 2017-08-23 LAB — URINALYSIS, ROUTINE W REFLEX MICROSCOPIC
BILIRUBIN URINE: NEGATIVE
Glucose, UA: NEGATIVE mg/dL
HGB URINE DIPSTICK: NEGATIVE
Ketones, ur: NEGATIVE mg/dL
Leukocytes, UA: NEGATIVE
Nitrite: NEGATIVE
PROTEIN: NEGATIVE mg/dL
Specific Gravity, Urine: 1.025 (ref 1.005–1.030)
pH: 6.5 (ref 5.0–8.0)

## 2017-08-23 LAB — CBC WITH DIFFERENTIAL/PLATELET
BASOS ABS: 0 10*3/uL (ref 0.0–0.1)
Basophils Relative: 0 %
EOS PCT: 4 %
Eosinophils Absolute: 0.2 10*3/uL (ref 0.0–0.7)
HEMATOCRIT: 37.2 % (ref 36.0–46.0)
Hemoglobin: 12.3 g/dL (ref 12.0–15.0)
LYMPHS PCT: 43 %
Lymphs Abs: 2.5 10*3/uL (ref 0.7–4.0)
MCH: 31.1 pg (ref 26.0–34.0)
MCHC: 33.1 g/dL (ref 30.0–36.0)
MCV: 93.9 fL (ref 78.0–100.0)
MONO ABS: 0.6 10*3/uL (ref 0.1–1.0)
Monocytes Relative: 11 %
Neutro Abs: 2.4 10*3/uL (ref 1.7–7.7)
Neutrophils Relative %: 42 %
Platelets: 250 10*3/uL (ref 150–400)
RBC: 3.96 MIL/uL (ref 3.87–5.11)
RDW: 12.7 % (ref 11.5–15.5)
WBC: 5.7 10*3/uL (ref 4.0–10.5)

## 2017-08-23 LAB — COMPREHENSIVE METABOLIC PANEL
ALT: 16 U/L (ref 14–54)
AST: 21 U/L (ref 15–41)
Albumin: 3.9 g/dL (ref 3.5–5.0)
Alkaline Phosphatase: 74 U/L (ref 38–126)
Anion gap: 6 (ref 5–15)
BILIRUBIN TOTAL: 0.2 mg/dL — AB (ref 0.3–1.2)
BUN: 23 mg/dL — AB (ref 6–20)
CALCIUM: 9.2 mg/dL (ref 8.9–10.3)
CO2: 25 mmol/L (ref 22–32)
Chloride: 113 mmol/L — ABNORMAL HIGH (ref 101–111)
Creatinine, Ser: 0.9 mg/dL (ref 0.44–1.00)
Glucose, Bld: 93 mg/dL (ref 65–99)
Potassium: 4 mmol/L (ref 3.5–5.1)
Sodium: 144 mmol/L (ref 135–145)
TOTAL PROTEIN: 7 g/dL (ref 6.5–8.1)

## 2017-08-23 LAB — LIPASE, BLOOD: LIPASE: 30 U/L (ref 11–51)

## 2017-08-23 MED ORDER — ONDANSETRON HCL 4 MG/2ML IJ SOLN
4.0000 mg | Freq: Once | INTRAMUSCULAR | Status: AC
Start: 2017-08-23 — End: 2017-08-23
  Administered 2017-08-23: 4 mg via INTRAVENOUS

## 2017-08-23 MED ORDER — ONDANSETRON HCL 4 MG/2ML IJ SOLN
INTRAMUSCULAR | Status: AC
  Filled 2017-08-23: qty 2

## 2017-08-23 MED ORDER — ONDANSETRON 4 MG PO TBDP: 4.0000 mg | ORAL_TABLET | Freq: Three times a day (TID) | ORAL | 0 refills | Status: DC | PRN

## 2017-08-23 MED ORDER — HYDROCODONE-ACETAMINOPHEN 5-325 MG PO TABS: ORAL_TABLET | ORAL | 0 refills | Status: DC

## 2017-08-23 NOTE — ED Triage Notes (Signed)
Intermittent left flank pain, right groin pain x several days. Was seen at PCP and dx with kidney stones (no hematuria, 'clear urine'). Started on cipro 4 days within relief.

## 2017-08-23 NOTE — ED Provider Notes (Signed)
[facility] EMERGENCY DEPARTMENT Provider Note   CSN: [id number] Arrival date & time: 08/23/17  1119     History   Chief Complaint Chief Complaint  Patient presents with  . Flank Pain    HPI Martha Moss is a 52 y.o. female.  Patient with history of cholecystectomy, hysterectomy, surgery for ovarian cyst, family history of kidney stones --presents with complaint of right-sided flank pain for the past 1 week.  Pain is waxing and waning and sharp.  It goes from the flank down into her right lower quadrant and groin area.  She has had some nausea but no vomiting.  She has had chills but no fever.  She has a history of interstitial cystitis but states that the symptoms do not feel like previous flares.  Patient was seen at an urgent care last week and had urine that was normal, however she was started on Cipro "for a kidney stone".  No vaginal bleeding or discharge.  Normal appetite.  Onset of symptoms acute.  Nothing makes symptoms better or worse.  She has been trying ibuprofen without relief.      Past Medical History:  Diagnosis Date  . Depression   . Fibromyalgia   . GERD (gastroesophageal reflux disease)   . Migraine   . PONV (postoperative nausea and vomiting)     Patient Active Problem List   Diagnosis Date Noted  . Physical exam 09/25/2016  . Chronic idiopathic constipation 10/13/2014  . Contact dermatitis due to jewelry 02/19/2014  . Acute sinusitis 11/19/2013  . Insomnia 06/22/2013  . Pain in right wrist 06/22/2013  . Soft tissue mass 06/22/2013  . Plantar fasciitis of left foot 03/14/2013  . Shaky 02/22/2013  . Depression with anxiety 10/28/2010  . Migraine without aura 10/28/2010  . GERD 10/28/2010  . CHEST PAIN, ATYPICAL, HX OF 03/27/2010  . ALLERGIC RHINITIS, SEASONAL 12/07/2009  . Fibromyalgia 10/26/2009  . POLYARTHRITIS 10/19/2009    Past Surgical History:  Procedure Laterality Date  . CHOLECYSTECTOMY    . ETHMOIDECTOMY Bilateral  09/09/2016   Procedure: ETHMOIDECTOMY;  Surgeon: Newman Pies, MD;  Location: [facility];  Service: ENT;  Laterality: Bilateral;  . FRONTAL SINUS EXPLORATION Bilateral 09/09/2016   Procedure: FRONTAL RECESS SINUS EXPLORATION;  Surgeon: Newman Pies, MD;  Location: [facility];  Service: ENT;  Laterality: Bilateral;  . MAXILLARY ANTROSTOMY Bilateral 09/09/2016   Procedure: MAXILLARY ANTROSTOMY WITH TISSUE REMOVAL;  Surgeon: Newman Pies, MD;  Location: [facility];  Service: ENT;  Laterality: Bilateral;  . OVARIAN CYST SURGERY    . SINUS ENDO W/FUSION Bilateral 09/09/2016   Procedure: ENDOSCOPIC SINUS SURGERY WITH NAVIGATION;  Surgeon: Newman Pies, MD;  Location: [facility];  Service: ENT;  Laterality: Bilateral;  . TOTAL VAGINAL HYSTERECTOMY    . TURBINATE REDUCTION Bilateral 09/09/2016   Procedure: BILATERAL TURBINATE REDUCTION;  Surgeon: Newman Pies, MD;  Location: [facility];  Service: ENT;  Laterality: Bilateral;    OB History    Gravida Para Term Preterm AB Living   3 3 3     3    SAB TAB Ectopic Multiple Live Births           3       Home Medications    Prior to Admission medications   Medication Sig Start Date End Date Taking? Authorizing Provider  buPROPion (WELLBUTRIN XL) 300 MG 24 hr tablet TAKE 1 TABLET BY MOUTH ONCE DAILY 07/06/17   Beverely Low,  Helane Rima, MD  citalopram (CELEXA) 20 MG tablet Take 1 tablet (20 mg total) by mouth daily. 01/21/17   Brock Bad, MD  levocetirizine (XYZAL) 5 MG tablet Take 5 mg by mouth every evening.    [provider]  levofloxacin (LEVAQUIN) 500 MG tablet Take 1 tablet (500 mg total) by mouth daily. 04/08/17   Sheliah Hatch, MD  magnesium 30 MG tablet Take 30 mg by mouth every other day.    [provider]  Multiple Vitamin (MULTIVITAMIN WITH MINERALS) TABS tablet Take 1 tablet by mouth daily.    [provider]  Ospemifene 60 MG TABS Take 1 tablet by mouth  daily. Take with food. 02/12/17   Brock Bad, MD  topiramate (TOPAMAX) 200 MG tablet TAKE 1 TABLET BY MOUTH 2 TIMES DAILY. 04/15/17   Sheliah Hatch, MD  traZODone (DESYREL) 50 MG tablet TAKE 1/2 TO 1 TABLETS BY MOUTH AT BEDTIME AS NEEDED FOR SLEEP 05/14/17   Sheliah Hatch, MD  vitamin E 1000 UNIT capsule Take 1,000 Units by mouth daily.    [provider]    Family History Family History  Problem Relation Age of Onset  . Kidney Stones Daughter   . Heart attack Father 87  . Diabetes Maternal Grandmother     Social History Social History   Tobacco Use  . Smoking status: Never Smoker  . Smokeless tobacco: Never Used  Substance Use Topics  . Alcohol use: Yes    Comment: rarely  . Drug use: No     Allergies   Bee venom; Azithromycin; Diphenhydramine hcl; Erythromycin; Latex; Morphine and related; Penicillins; Percocet [oxycodone-acetaminophen]; Sulfonamide derivatives; and Tetracyclines & related   Review of Systems Review of Systems  Constitutional: Positive for chills. Negative for fever.  HENT: Negative for rhinorrhea and sore throat.   Eyes: Negative for redness.  Respiratory: Negative for cough.   Cardiovascular: Negative for chest pain.  Gastrointestinal: Positive for nausea. Negative for abdominal pain, diarrhea and vomiting.  Genitourinary: Positive for flank pain and pelvic pain. Negative for dysuria, frequency and hematuria.  Musculoskeletal: Positive for back pain. Negative for myalgias.  Skin: Negative for rash.  Neurological: Negative for headaches.     Physical Exam Updated Vital Signs BP 114/69 (BP Location: Left Arm)   Pulse 84   Temp 99.2 F (37.3 C) (Oral)   Resp 18   Ht 5\' 2"  (1.575 m)   Wt 61.2 kg (135 lb)   SpO2 100%   BMI 24.69 kg/m   Physical Exam  Constitutional: She appears well-developed and well-nourished.  HENT:  Head: Normocephalic and atraumatic.  Eyes: Conjunctivae are normal. Right eye exhibits no  discharge. Left eye exhibits no discharge.  Neck: Normal range of motion. Neck supple.  Cardiovascular: Normal rate, regular rhythm and normal heart sounds.  Pulmonary/Chest: Effort normal and breath sounds normal.  Abdominal: Soft. There is no hepatosplenomegaly. There is tenderness in the right lower quadrant, epigastric area and suprapubic area. There is CVA tenderness (L>R). There is no rigidity, no rebound, no guarding and no tenderness at McBurney's point.  Neurological: She is alert.  Skin: Skin is warm and dry.  Psychiatric: She has a normal mood and affect.  Nursing note and vitals reviewed.    ED Treatments / Results  Labs (all labs ordered are listed, but only abnormal results are displayed) Labs Reviewed  COMPREHENSIVE METABOLIC PANEL - Abnormal; Notable for the following components:      Result Value  Chloride 113 (*)    BUN 23 (*)    Total Bilirubin 0.2 (*)    All other components within normal limits  URINALYSIS, ROUTINE W REFLEX MICROSCOPIC  CBC WITH DIFFERENTIAL/PLATELET  LIPASE, BLOOD    EKG  EKG Interpretation None       Radiology Ct Renal Stone Study  Result Date: 08/23/2017 CLINICAL DATA:  Flank pain, stone disease suspected. Intermittent left flank pain. Increased pain with urination. EXAM: CT ABDOMEN AND PELVIS WITHOUT CONTRAST TECHNIQUE: Multidetector CT imaging of the abdomen and pelvis was performed following the standard protocol without IV contrast. COMPARISON:  None. FINDINGS: Lower chest: The lung bases are clear without focal nodule, mass, or airspace disease. Heart size is normal. No significant pleural or pericardial effusion is present. Hepatobiliary: A benign hepatic cyst near the dome measures 3.1 cm. A subcentimeter cyst is present within segment V on image 14 of series 2. No other focal hepatic lesions are present. Cholecystectomy is present. The common bile duct is within normal limits. Pancreas: Unremarkable. No pancreatic ductal  dilatation or surrounding inflammatory changes. Spleen: Normal in size without focal abnormality. Adrenals/Urinary Tract: Adrenal glands are normal bilaterally. Kidneys and ureters are within normal limits. There is no stone or mass lesion. There is no obstruction. The urinary bladder is within normal limits. Stomach/Bowel: Stomach is moderately distended. No obstructing lesion is evident. Small bowel is unremarkable. The terminal ileum is within normal limits. The appendix is visualized and within normal limits. The ascending and transverse colon are within normal limits. The descending and sigmoid colon are normal. Vascular/Lymphatic: No significant vascular findings are present. No enlarged abdominal or pelvic lymph nodes. Reproductive: Status post hysterectomy. No adnexal masses. Other: No abdominal wall hernia or abnormality. No abdominopelvic ascites. Musculoskeletal: Vertebral body heights and alignment are maintained. No focal lytic or blastic lesions are present. Pelvis is intact. Hips are located and within normal limits bilaterally. IMPRESSION: 1. No nephrolithiasis or urinary tract obstruction. 2. Benign cysts of the liver. 3. No acute or focal lesion to explain the patient's intermittent flank pain. Electronically Signed   By: Marin Roberts M.D.   On: 08/23/2017 12:19    Procedures Procedures (including critical care time)  Medications Ordered in ED Medications - No data to display   Initial Impression / Assessment and Plan / ED Course  I have reviewed the triage vital signs and the nursing notes.  Pertinent labs & imaging results that were available during my care of the patient were reviewed by me and considered in my medical decision making (see chart for details).     Patient seen and examined. Work-up initiated.  Patient declines pain medication at the current time.  Plan: Lab work, UA, noncontrast CT.  Vital signs reviewed and are as follows: BP 114/69 (BP Location: Left  Arm)   Pulse 84   Temp 99.2 F (37.3 C) (Oral)   Resp 18   Ht 5\' 2"  (1.575 m)   Wt 61.2 kg (135 lb)   SpO2 100%   BMI 24.69 kg/m   1:11 PM patient and husband updated on results.  Plan discharge to home with pain medicine to use only as needed, Zofran.  Patient has been having some difficulty sleeping due to pain.  Patient counseled on use of narcotic pain medications. Counseled not to combine these medications with others containing tylenol. Urged not to drink alcohol, drive, or perform any other activities that requires focus while taking these medications. The patient verbalizes understanding and agrees  with the plan.  Discussed need for close PCP follow-up. The patient was urged to return to the Emergency Department immediately with worsening of current symptoms, worsening abdominal pain, persistent vomiting, blood noted in stools, fever, or any other concerns. The patient verbalized understanding.    Final Clinical Impressions(s) / ED Diagnoses   Final diagnoses:  Right flank pain  Right lower quadrant abdominal pain   Patient with right flank and abdominal pains.  Workup today is reassuring with normal lab work and CT demonstrating stable liver cyst only.  No appendicitis, pyelonephritis, renal stone, diverticulitis.  Discussed with patient possible etiologies of her pain that would not show up on workup today, necessitating close PCP follow-up.  She states that she will follow-up.  She understands signs and symptoms which should cause her to return.  Will treat symptomatically.  ED Discharge Orders        Ordered    ondansetron (ZOFRAN ODT) 4 MG disintegrating tablet  Every 8 hours PRN     08/23/17 1307    HYDROcodone-acetaminophen (NORCO/VICODIN) 5-325 MG tablet     08/23/17 1307       Renne CriglerGeiple, Lylianna Fraiser, PA-C 08/23/17 1313    Charlynne PanderYao, David Hsienta, MD 08/23/17 [phone]

## 2017-08-23 NOTE — Discharge Instructions (Signed)
Please read and follow all provided instructions.  Your diagnoses today include:  1. Right flank pain   2. Right lower quadrant abdominal pain     Tests performed today include:  Blood counts and electrolytes  Blood tests to check liver and kidney function  Blood tests to check pancreas function  Urine test to look for infection  CT imaging - shows stable liver cyst but no other problems.   Vital signs. See below for your results today.   Medications prescribed:   Vicodin (hydrocodone/acetaminophen) - narcotic pain medication  DO NOT drive or perform any activities that require you to be awake and alert because this medicine can make you drowsy. BE VERY CAREFUL not to take multiple medicines containing Tylenol (also called acetaminophen). Doing so can lead to an overdose which can damage your liver and cause liver failure and possibly death.   Zofran (ondansetron) - for nausea and vomiting  Take any prescribed medications only as directed.  Home care instructions:   Follow any educational materials contained in this packet.  Follow-up instructions: Please follow-up with your primary care provider in the next 2-3 days for further evaluation of your symptoms.    Return instructions:  SEEK IMMEDIATE MEDICAL ATTENTION IF:  The pain does not go away or becomes severe   A temperature above 101F develops   Repeated vomiting occurs (multiple episodes)   The pain becomes localized to portions of the abdomen. The right side could possibly be appendicitis. In an adult, the left lower portion of the abdomen could be colitis or diverticulitis.   Blood is being passed in stools or vomit (bright red or black tarry stools)   You develop chest pain, difficulty breathing, dizziness or fainting, or become confused, poorly responsive, or inconsolable (young children)  If you have any other emergent concerns regarding your health  Additional Information: Abdominal (belly) pain can  be caused by many things. Your caregiver performed an examination and possibly ordered blood/urine tests and imaging (CT scan, x-rays, ultrasound). Many cases can be observed and treated at home after initial evaluation in the emergency department. Even though you are being discharged home, abdominal pain can be unpredictable. Therefore, you need a repeated exam if your pain does not resolve, returns, or worsens. Most patients with abdominal pain don't have to be admitted to the hospital or have surgery, but serious problems like appendicitis and gallbladder attacks can start out as nonspecific pain. Many abdominal conditions cannot be diagnosed in one visit, so follow-up evaluations are very important.  Your vital signs today were: BP 114/69 (BP Location: Left Arm)    Pulse 84    Temp 99.2 F (37.3 C) (Oral)    Resp 18    Ht 5\' 2"  (1.575 m)    Wt 61.2 kg (135 lb)    SpO2 100%    BMI 24.69 kg/m  If your blood pressure (bp) was elevated above 135/85 this visit, please have this repeated by your doctor within one month. --------------

## 2017-08-23 NOTE — ED Notes (Signed)
ED Provider at bedside. 

## 2017-08-23 NOTE — ED Notes (Signed)
Patient transported to CT 

## 2017-08-25 ENCOUNTER — Inpatient Hospital Stay: Payer: No Typology Code available for payment source | Admitting: Family Medicine

## 2017-08-27 ENCOUNTER — Ambulatory Visit: Payer: Self-pay | Admitting: Family Medicine

## 2017-08-27 ENCOUNTER — Encounter: Payer: Self-pay | Admitting: Family Medicine

## 2017-08-27 ENCOUNTER — Other Ambulatory Visit: Payer: Self-pay

## 2017-08-27 VITALS — BP 112/74 | HR 80 | Temp 99.3°F | Resp 16 | Ht 62.0 in | Wt 137.4 lb

## 2017-08-27 DIAGNOSIS — R102 Pelvic and perineal pain: Secondary | ICD-10-CM

## 2017-08-27 DIAGNOSIS — R109 Unspecified abdominal pain: Secondary | ICD-10-CM

## 2017-08-27 LAB — POCT URINALYSIS DIPSTICK
BILIRUBIN UA: NEGATIVE
Blood, UA: NEGATIVE
Glucose, UA: NEGATIVE
Ketones, UA: NEGATIVE
Leukocytes, UA: NEGATIVE
Nitrite, UA: NEGATIVE
PROTEIN UA: NEGATIVE
Spec Grav, UA: 1.025 (ref 1.010–1.025)
UROBILINOGEN UA: 0.2 U/dL
pH, UA: 5 (ref 5.0–8.0)

## 2017-08-27 MED ORDER — CYCLOBENZAPRINE HCL 10 MG PO TABS: 10.0000 mg | ORAL_TABLET | Freq: Three times a day (TID) | ORAL | 0 refills | Status: DC | PRN

## 2017-08-27 NOTE — Patient Instructions (Signed)
Follow up as needed or as scheduled RESTART the Cipro twice daily Continue to drink LOTS of water! Alternate tylenol/ibuprofen as needed for pain/fever HEAT!!! Use the Flexeril as needed for low back pain- may cause drowsiness Call with any questions or concerns- particularly if something changes Hang in there!!!

## 2017-08-27 NOTE — Progress Notes (Signed)
   Subjective:    Patient ID: Laural BenesSherry L Leppla, female    DOB: 04/18/1966, 52 y.o.   MRN: [id number]  HPI ER f/u- pt was seen in ER 1/13 for severe flank pain.  UA and CT scan WNL.  Prior to ER had gone to UC 1 week ago and was tx'd w/ Cipro for a suspected kidney stone (only took 2 days of meds).  ER tx'd w/ hydrocodone and Zofran.  Pain is now bilateral low back pain that radiates across.  R leg is now going numb.  abd feels 'so swollen I could burst'.  + pain w/ urination- a 'pulling pain and pins and needles'.  + increased frequency, decreased output.  Drinking a lot of fluids.  + chills and sweats but no documented fever.  + nausea.   Review of Systems For ROS see HPI     Objective:   Physical Exam  Constitutional: She is oriented to person, place, and time. She appears well-developed and well-nourished. No distress.  Abdominal: Soft. Bowel sounds are normal. She exhibits distension. There is tenderness (+ suprapubic TTP and R CVA TTP). There is guarding (voluntary guarding). There is no rebound.  Musculoskeletal: She exhibits tenderness (TTP across low back bilaterally).  Neurological: She is alert and oriented to person, place, and time.  Skin: Skin is warm and dry.  Psychiatric: She has a normal mood and affect. Her behavior is normal. Thought content normal.  Vitals reviewed.         Assessment & Plan:  Suprapubic/flank pain- new to provider.  Pt has had sxs x1 week.  Only took 2 days of Cipro before going to ER on 1/13.  She stopped the meds after a negative ER work up (reviewed UA and CT scan).  Clinically, she is describing pyelo- sweats, chills, flank pain, suprapubic pain, pain w/ urination, LBP, frequency, urgency, hesitancy.  Restart Cipro.  Send urine for cx.  She has pain meds to use PRN.  Start Flexeril prn.  Reviewed supportive care and red flags that should prompt return.  Pt expressed understanding and is in agreement w/ plan.

## 2017-08-28 LAB — URINE CULTURE
MICRO NUMBER:: 90072481
SPECIMEN QUALITY:: ADEQUATE

## 2017-09-15 ENCOUNTER — Telehealth: Payer: Self-pay | Admitting: *Deleted

## 2017-09-15 ENCOUNTER — Other Ambulatory Visit: Payer: Self-pay | Admitting: Family Medicine

## 2017-09-15 MED ORDER — SCOPOLAMINE 1 MG/3DAYS TD PT72: 1.0000 | MEDICATED_PATCH | TRANSDERMAL | 0 refills | Status: DC

## 2017-09-15 NOTE — Telephone Encounter (Signed)
Ok for Scopolamine patch- change q72 hrs, Disp 1 box (4 patches)

## 2017-09-15 NOTE — Telephone Encounter (Signed)
Copied from CRM [id number]. Topic: Quick Communication - Rx Refill/Question >> Sep 15, 2017  1:20 PM Crist InfanteHarrald, Kathy J wrote: Medication: sea sick med Pt does not know the name Pt going on cruise and wants to know if Dr Beverely Lowabori will call in this for her? [facility] - [place] - [address] [phone] (Phone) [phone] (Fax)

## 2017-09-15 NOTE — Telephone Encounter (Signed)
Medication filled to pharmacy as requested.   

## 2017-09-15 NOTE — Addendum Note (Signed)
Addended by: Geannie RisenBRODMERKEL, JESSICA L on: 09/15/2017 11:44 AM   Modules accepted: Orders

## 2017-09-15 NOTE — Telephone Encounter (Signed)
Patient going on a 10 day cruise and would like a patch for sea sickness if possible   Routing to provider to advise.

## 2017-09-15 NOTE — Telephone Encounter (Signed)
SEE PREVIOUS PHONE NOTE  Medication has already been called in for patient.

## 2017-09-16 ENCOUNTER — Telehealth: Payer: Self-pay | Admitting: General Practice

## 2017-09-16 NOTE — Telephone Encounter (Signed)
Spoke with 2 personnel with [facility] and they advised that pt could not be found in their prescription database or in the patient assistance database.   Pt was called and detailed message left to advise that she can pay out of pocket for Rx $99.15 or she can go to [email or web address] and print a coupon for the medication.

## 2017-11-26 ENCOUNTER — Ambulatory Visit: Payer: Self-pay | Admitting: Family Medicine

## 2017-11-26 ENCOUNTER — Encounter: Payer: Self-pay | Admitting: Family Medicine

## 2017-11-26 ENCOUNTER — Ambulatory Visit: Payer: No Typology Code available for payment source | Admitting: Family Medicine

## 2017-11-26 ENCOUNTER — Other Ambulatory Visit: Payer: Self-pay

## 2017-11-26 VITALS — BP 118/80 | HR 83 | Temp 99.1°F | Resp 16 | Ht 62.0 in | Wt 135.2 lb

## 2017-11-26 DIAGNOSIS — B9689 Other specified bacterial agents as the cause of diseases classified elsewhere: Secondary | ICD-10-CM

## 2017-11-26 DIAGNOSIS — J329 Chronic sinusitis, unspecified: Secondary | ICD-10-CM

## 2017-11-26 DIAGNOSIS — G43009 Migraine without aura, not intractable, without status migrainosus: Secondary | ICD-10-CM

## 2017-11-26 DIAGNOSIS — F418 Other specified anxiety disorders: Secondary | ICD-10-CM

## 2017-11-26 MED ORDER — LEVOFLOXACIN 500 MG PO TABS: 500.0000 mg | ORAL_TABLET | Freq: Every day | ORAL | 0 refills | Status: DC

## 2017-11-26 MED ORDER — CITALOPRAM HYDROBROMIDE 40 MG PO TABS: 40.0000 mg | ORAL_TABLET | Freq: Every day | ORAL | 3 refills | Status: DC

## 2017-11-26 NOTE — Assessment & Plan Note (Signed)
Deteriorated due to her daughter's family moving back in with them.  Increase Citalopram to 40mg  daily and continue Wellbutrin at 300mg  daily.  Pt expressed understanding and is in agreement w/ plan.

## 2017-11-26 NOTE — Assessment & Plan Note (Signed)
Pt wants to wean her Topamax but fears gaining weight.  1st step is to decrease to 1/2 tab BID and see if her HAs return.  Pt expressed understanding and is in agreement w/ plan.
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
## 2017-11-26 NOTE — Patient Instructions (Addendum)
Follow up as needed or as scheduled Add Levaquin for another 7 days- take w/ food and drink lots of fluids Continue your daily allergy medication Increase your Citalopram to 40mg  daily- 2 of what you have at home and 1 of the new prescription Decrease the Topamax to 1/2 tab twice daily and see how you feel Call with any questions or concerns Happy Odis Lusteraster!!!

## 2017-12-12 ENCOUNTER — Other Ambulatory Visit: Payer: Self-pay | Admitting: Family Medicine

## 2017-12-19 ENCOUNTER — Ambulatory Visit (INDEPENDENT_AMBULATORY_CARE_PROVIDER_SITE_OTHER): Payer: Self-pay | Admitting: Family Medicine

## 2017-12-19 ENCOUNTER — Encounter: Payer: Self-pay | Admitting: Family Medicine

## 2017-12-19 VITALS — BP 112/78 | HR 76 | Temp 98.5°F | Resp 16 | Wt 136.0 lb

## 2017-12-19 DIAGNOSIS — M797 Fibromyalgia: Secondary | ICD-10-CM

## 2017-12-19 DIAGNOSIS — M79604 Pain in right leg: Secondary | ICD-10-CM

## 2017-12-19 DIAGNOSIS — M79605 Pain in left leg: Secondary | ICD-10-CM

## 2017-12-19 DIAGNOSIS — M722 Plantar fascial fibromatosis: Secondary | ICD-10-CM

## 2017-12-19 DIAGNOSIS — M79672 Pain in left foot: Secondary | ICD-10-CM

## 2017-12-19 DIAGNOSIS — M79671 Pain in right foot: Secondary | ICD-10-CM

## 2017-12-19 MED ORDER — GABAPENTIN 300 MG PO CAPS: 300.0000 mg | ORAL_CAPSULE | Freq: Every day | ORAL | 0 refills | Status: DC

## 2017-12-19 MED ORDER — TRAMADOL HCL 50 MG PO TABS: 50.0000 mg | ORAL_TABLET | Freq: Three times a day (TID) | ORAL | 0 refills | Status: DC | PRN

## 2017-12-19 NOTE — Progress Notes (Signed)
Subjective  CC: No chief complaint on file.   HPI: Martha Moss is a 52 y.o. female who presents to the office today to address the problems listed above in the chief complaint.  52 yo c/o 2 month h/o leg and foot pain but worsening over last 2 weeks. No injury, fall, or significant back pain. 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 Pain is constant and aching, now keeping her awake at night. No swelling, calf pain, swollen joints, bowel/bladder dysfunction. Has h/o fibromyalgia but "this feels different". Seen today in acute care clinic; has not yet been able to schedule with PCP. Using otc nsaids w/o relief.   Recent increase in home stressors have improved.   I reviewed the patients updated PMH, FH, and SocHx.    Patient Active Problem List   Diagnosis Date Noted  . Physical exam 09/25/2016  . Chronic idiopathic constipation 10/13/2014  . Contact dermatitis due to jewelry 02/19/2014  . Acute sinusitis 11/19/2013  . Insomnia 06/22/2013  . Pain in right wrist 06/22/2013  . Soft tissue mass 06/22/2013  . Plantar fasciitis of left foot 03/14/2013  . Shaky 02/22/2013  . Depression with anxiety 10/28/2010  . Migraine without aura 10/28/2010  . GERD 10/28/2010  . CHEST PAIN, ATYPICAL, HX OF 03/27/2010  . ALLERGIC RHINITIS, SEASONAL 12/07/2009  . Fibromyalgia 10/26/2009  . POLYARTHRITIS 10/19/2009   Current Meds  Medication Sig  . buPROPion (WELLBUTRIN XL) 300 MG 24 hr tablet TAKE 1 TABLET BY MOUTH ONCE DAILY  . citalopram (CELEXA) 40 MG tablet Take 1 tablet (40 mg total) by mouth daily.  . cyclobenzaprine (FLEXERIL) 10 MG tablet Take 1 tablet (10 mg total) by mouth 3 (three) times daily as needed for muscle spasms.  Marland Kitchen levocetirizine (XYZAL) 5 MG tablet Take 5 mg by mouth every evening.  . magnesium 30 MG tablet Take 30 mg by mouth every other day.  . Multiple Vitamin (MULTIVITAMIN WITH MINERALS) TABS tablet Take 1 tablet by mouth  daily.  Marland Kitchen topiramate (TOPAMAX) 200 MG tablet TAKE 1 TABLET BY MOUTH 2 TIMES DAILY.  . traZODone (DESYREL) 50 MG tablet TAKE 1/2 TO 1 TABLET BY MOUTH AT BEDTIME AS NEEDED FOR SLEEP  . vitamin E 1000 UNIT capsule Take 1,000 Units by mouth daily.    Allergies: Patient is allergic to bee venom; azithromycin; diphenhydramine hcl; erythromycin; latex; morphine and related; penicillins; percocet [oxycodone-acetaminophen]; sulfonamide derivatives; and tetracyclines & related. Family History: Patient family history includes Diabetes in her maternal grandmother; Heart attack (age of onset: 27) in her father; Kidney Stones in her daughter. Social History:  Patient  reports that she has never smoked. She has never used smokeless tobacco. She reports that she drinks alcohol. She reports that she does not use drugs.  Review of Systems: Constitutional: Negative for fever malaise or anorexia Cardiovascular: negative for chest pain Respiratory: negative for SOB or persistent cough Gastrointestinal: negative for abdominal pain  Objective  Vitals: BP 112/78   Pulse 76   Temp 98.5 F (36.9 C) (Oral)   Resp 16   Wt 136 lb (61.7 kg)   SpO2 97%   BMI 24.87 kg/m  General: no acute distress , A&Ox3 MSK: nl appearing LE, no swelling or bruising or joint changes; very tender bilateral feet to light touch, no joint ttp, intact ankle, tender BLE diffusely, ROM of knees nl, neg SLR ble, back, from, nl gait, nl sensation to light touch  Skin:  Warm, no rashes  Assessment  1. Bilateral leg and foot pain   2. Fibromyalgia   3. Plantar fasciitis of left foot      Plan   pain:  ? Fibromyalgia flare vs neuropathy vs other; start gabapentin and tramadol and f/u with PCP for further evaluation and treatment.   Follow up: f/u with pcp    Commons side effects, risks, benefits, and alternatives for medications and treatment plan prescribed today were discussed, and the patient expressed understanding of the  given instructions. Patient is instructed to call or message via MyChart if he/she has any questions or concerns regarding our treatment plan. No barriers to understanding were identified. We discussed Red Flag symptoms and signs in detail. Patient expressed understanding regarding what to do in case of urgent or emergency type symptoms.   Medication list was reconciled, printed and provided to the patient in AVS. Patient instructions and summary information was reviewed with the patient as documented in the AVS. This note was prepared with assistance of Dragon voice recognition software. Occasional wrong-word or sound-a-like substitutions may have occurred due to the inherent limitations of voice recognition software  No orders of the defined types were placed in this encounter.  Meds ordered this encounter  Medications  . traMADol (ULTRAM) 50 MG tablet    Sig: Take 1 tablet (50 mg total) by mouth every 8 (eight) hours as needed.    Dispense:  20 tablet    Refill:  0  . gabapentin (NEURONTIN) 300 MG capsule    Sig: Take 1 capsule (300 mg total) by mouth at bedtime.    Dispense:  30 capsule    Refill:  0

## 2017-12-19 NOTE — Patient Instructions (Signed)
Please schedule an appointment with Dr. Beverely Low next week for further evaluation of your pain.   Start the gabapentin nightly; this should help.  You may use the tramadol if needed for pain as well.   If you have any questions or concerns, please don't hesitate to send me a message via MyChart or call the office at [phone]. Thank you for visiting with Martha Moss today! It's our pleasure caring for you.

## 2017-12-22 ENCOUNTER — Other Ambulatory Visit: Payer: Self-pay

## 2017-12-22 ENCOUNTER — Ambulatory Visit: Payer: Self-pay | Admitting: Family Medicine

## 2017-12-22 ENCOUNTER — Encounter: Payer: Self-pay | Admitting: Family Medicine

## 2017-12-22 DIAGNOSIS — G5793 Unspecified mononeuropathy of bilateral lower limbs: Secondary | ICD-10-CM | POA: Insufficient documentation

## 2017-12-22 LAB — CBC WITH DIFFERENTIAL/PLATELET
BASOS ABS: 0.1 10*3/uL (ref 0.0–0.1)
Basophils Relative: 0.8 % (ref 0.0–3.0)
Eosinophils Absolute: 0.1 10*3/uL (ref 0.0–0.7)
Eosinophils Relative: 1.2 % (ref 0.0–5.0)
HEMATOCRIT: 36.3 % (ref 36.0–46.0)
Hemoglobin: 12.1 g/dL (ref 12.0–15.0)
LYMPHS PCT: 39.4 % (ref 12.0–46.0)
Lymphs Abs: 2.6 10*3/uL (ref 0.7–4.0)
MCHC: 33.5 g/dL (ref 30.0–36.0)
MCV: 93.2 fl (ref 78.0–100.0)
MONOS PCT: 6.4 % (ref 3.0–12.0)
Monocytes Absolute: 0.4 10*3/uL (ref 0.1–1.0)
NEUTROS PCT: 52.2 % (ref 43.0–77.0)
Neutro Abs: 3.4 10*3/uL (ref 1.4–7.7)
Platelets: 286 10*3/uL (ref 150.0–400.0)
RBC: 3.89 Mil/uL (ref 3.87–5.11)
RDW: 13.3 % (ref 11.5–15.5)
WBC: 6.6 10*3/uL (ref 4.0–10.5)

## 2017-12-22 LAB — BASIC METABOLIC PANEL
BUN: 27 mg/dL — ABNORMAL HIGH (ref 6–23)
CO2: 26 mEq/L (ref 19–32)
Calcium: 9.2 mg/dL (ref 8.4–10.5)
Chloride: 112 mEq/L (ref 96–112)
Creatinine, Ser: 0.86 mg/dL (ref 0.40–1.20)
GFR: 73.62 mL/min (ref >60.00)
Glucose, Bld: 73 mg/dL (ref 70–99)
Potassium: 3.8 mEq/L (ref 3.5–5.1)
SODIUM: 148 meq/L — AB (ref 135–145)

## 2017-12-22 LAB — B12 AND FOLATE PANEL: Vitamin B-12: 333 pg/mL (ref 211–911)

## 2017-12-22 LAB — TSH: TSH: 1.87 u[IU]/mL (ref 0.35–4.50)

## 2017-12-22 MED ORDER — GABAPENTIN 100 MG PO CAPS: 100.0000 mg | ORAL_CAPSULE | Freq: Two times a day (BID) | ORAL | 1 refills | Status: DC

## 2017-12-22 NOTE — Progress Notes (Signed)
   Subjective:    Patient ID: Martha Moss, female    DOB: Dec 24, 1965, 52 y.o.   MRN: [id number]  HPI Foot pain- [facility] for 'burning, tingling, and pain' of both feet.  Pain is now radiating up R leg.  She reports she is now having discomfort of hip and back due to changing her gait.  No known injury.  Pt reports L foot pain 'for a long time'.  Pain has worsened over the last 2 weeks.  Pain is keeping her up at night and 'I can hardly walk'.  Pt started Gabapentin  nightly which has improved pain considerably.     Review of Systems For ROS see HPI     Objective:   Physical Exam  Constitutional: She is oriented to person, place, and time. She appears well-developed and well-nourished. No distress.  Cardiovascular: Intact distal pulses.  Musculoskeletal: She exhibits no edema, tenderness or deformity.  Neurological: She is alert and oriented to person, place, and time. She displays normal reflexes. No sensory deficit (sensation intact to light touch). She exhibits normal muscle tone. Coordination normal.  Skin: Skin is warm and dry. No rash noted. No erythema.  Vitals reviewed.         Assessment & Plan:

## 2017-12-22 NOTE — Patient Instructions (Addendum)
Follow up in 2-3 weeks to recheck nerve pain in feet We'll notify you of your lab results and make any changes if needed ADD the  Gabapentin twice daily IN ADDITION to the  nightly If no obvious cause and no improvement w/ the Gabapentin, we'll refer you to Neuro for complete work up (once you have insurance) Call with any questions or concerns Hang in there!

## 2017-12-22 NOTE — Assessment & Plan Note (Signed)
New to provider, ongoing for pt.  She reports pain is improving w/ nightly gabapentin- this is allowing her to walk and sleep.  Given the improvement, will add  twice daily to the  dose at night to improve her sxs.  Will check labs to assess for metabolic cause and address any abnormalities if present.  If no metabolic issues and sxs persist, will need neuro referral for complete evaluation and tx.  Pt expressed understanding and is in agreement w/ plan.

## 2017-12-23 ENCOUNTER — Telehealth: Payer: Self-pay | Admitting: Family Medicine

## 2017-12-23 NOTE — Telephone Encounter (Unsigned)
Copied from CRM #[id number]. Topic: Quick Communication - Lab Results >> Dec 23, 2017 12:45 PM Percival Spanish wrote:  Pt return call office had call her with lab results

## 2017-12-23 NOTE — Telephone Encounter (Signed)
Patient notified

## 2017-12-28 ENCOUNTER — Other Ambulatory Visit: Payer: Self-pay | Admitting: Family Medicine

## 2017-12-28 MED ORDER — TRAMADOL HCL 50 MG PO TABS: 50.0000 mg | ORAL_TABLET | Freq: Three times a day (TID) | ORAL | 0 refills | Status: DC | PRN

## 2017-12-28 NOTE — Telephone Encounter (Signed)
Ok for another 20 tabs but if she needs another refill, she will need to be seen for additional evaluation as this is not meant to be a long term medication

## 2017-12-28 NOTE — Telephone Encounter (Signed)
Last OV 12/22/17, Next OV 01/06/18  Last filled 12/19/17, # 20 with 0 refills

## 2017-12-28 NOTE — Telephone Encounter (Signed)
Copied from CRM #[id number]. Topic: Inquiry >> Dec 28, 2017  3:36 PM Robinson, Andra M wrote: Reason for CRM: Patient's husband called requesting a refill of TraMADol (ULTRAM) 50 MG tablet. Patient husband states that she is completely out and in a lot of pain. Patient's preferred pharmacy is [facility] - [place] - [address] [phone] (Phone) [phone] (Fax      

## 2017-12-28 NOTE — Telephone Encounter (Signed)
Last OV 12/22/17 Tramadol last filled 12/19/17 #20 with 0

## 2017-12-28 NOTE — Telephone Encounter (Signed)
Copied from CRM [id number]. Topic: Inquiry >> Dec 28, 2017  3:36 PM Martha Moss wrote: Reason for CRM: Patient's husband called requesting a refill of TraMADol (ULTRAM) 50 MG tablet. Patient husband states that she is completely out and in a lot of pain. Patient's preferred pharmacy is [facility] - [place], [place] - [address] [phone] (Phone) [phone] (Fax

## 2017-12-29 NOTE — Telephone Encounter (Signed)
Called pt and left a detailed message on voicemail to inform.   Ok for [facility] to Discuss results / PCP recommendations / Schedule patient.

## 2017-12-31 ENCOUNTER — Encounter: Payer: Self-pay | Admitting: Family Medicine

## 2017-12-31 ENCOUNTER — Other Ambulatory Visit: Payer: Self-pay

## 2017-12-31 ENCOUNTER — Ambulatory Visit (INDEPENDENT_AMBULATORY_CARE_PROVIDER_SITE_OTHER): Payer: 59 | Admitting: Family Medicine

## 2017-12-31 VITALS — BP 120/68 | HR 80 | Temp 99.0°F | Resp 16 | Ht 62.0 in | Wt 137.0 lb

## 2017-12-31 DIAGNOSIS — K5904 Chronic idiopathic constipation: Secondary | ICD-10-CM | POA: Diagnosis not present

## 2017-12-31 DIAGNOSIS — G5793 Unspecified mononeuropathy of bilateral lower limbs: Secondary | ICD-10-CM | POA: Diagnosis not present

## 2017-12-31 MED ORDER — GABAPENTIN 100 MG PO CAPS: 200.0000 mg | ORAL_CAPSULE | Freq: Two times a day (BID) | ORAL | 1 refills | Status: DC

## 2017-12-31 MED ORDER — LINACLOTIDE 72 MCG PO CAPS: 72.0000 ug | ORAL_CAPSULE | Freq: Every day | ORAL | 6 refills | Status: DC

## 2017-12-31 NOTE — Assessment & Plan Note (Signed)
Deteriorated.  Pt is now able to afford Linzess so will start w/ the lowest dose and titrate up as needed.  Pt expressed understanding and is in agreement w/ plan.

## 2017-12-31 NOTE — Progress Notes (Signed)
   Subjective:    Patient ID: Martha Moss, female    DOB: 1965/10/15, 52 y.o.   MRN: [id number]  HPI Neuropathy- feet continue to burn.  Able to sleep w/ the increased dose of Gabapentin-  nightly.  Also taking  twice daily for daytime relief.  Feet are now most painful during the day when she is on them.  + sleepiness w/ , less so on the  dose.  Taking the gabapentin w/ the Tramadol.  Pt is nervous about switching to Lyrica b/c her sister had a stroke when taking this.  No longer painful to touch feet.  Pain will start in feet and radiate all the way to low back bilaterally.  Pt's job is asking for note giving her time off so they can bring in sub.  Chronic constipation- ongoing issue for pt.  We had previously discussed Linzess but she did not have insurance at the time.  Interested in trying at this point.  Pt reports she would not have BM w/o taking something.   Review of Systems For ROS see HPI     Objective:   Physical Exam  Constitutional: She is oriented to person, place, and time. She appears well-developed and well-nourished. No distress.  Abdominal: Soft. She exhibits no distension and no mass. There is no tenderness. There is no guarding.  Musculoskeletal: She exhibits no edema or tenderness (no TTP over either foot or lower leg).  Neurological: She is alert and oriented to person, place, and time. She displays normal reflexes. No sensory deficit. Coordination normal.  Antalgic gait (-) SLR raise bilaterally  Skin: Skin is warm and dry. No rash noted. No erythema. No pallor.  Vitals reviewed.         Assessment & Plan:

## 2017-12-31 NOTE — Assessment & Plan Note (Signed)
Ongoing.  Neuropathy is better at night on the  Gabapentin but she continues to have pain during the day on the  dose.  Will increase to  twice daily in hopes of avoiding excessive sedation.  Encouraged her to separate the Tramadol and the Gabapentin.  Will refer to neuro to try and determine source/etiology of neuropathy.  Pt's job is asking for note to allow time off work and for them to find a substitute.  Note provided.

## 2017-12-31 NOTE — Patient Instructions (Signed)
Schedule your complete physical at your convenience We'll call you with your Neuro appt INCREASE the Gabapentin to  (2 capsules) twice daily CONTINUE the Gabapentin  nightly Try and space out the gabapentin and Tramadol to avoid excessive fatigue START the Linzess daily to improve constipation- this is the lowest dose and we can go up if needed Call with any questions or concerns Hang in there!

## 2018-01-06 ENCOUNTER — Ambulatory Visit: Payer: Self-pay | Admitting: Family Medicine

## 2018-01-12 NOTE — Telephone Encounter (Signed)
Copied from CRM [id number]. Topic: General - Other >> Jan 12, 2018  3:32 PM Stephannie LiSimmons, Smokey Melott L, NT wrote: Reason for CRM: Patient called and said the  linaclotide (LINZESS) 72 MCG capsule  is not working for please call her at   [phone]

## 2018-01-12 NOTE — Telephone Encounter (Signed)
Please see message.  Please advise. 

## 2018-01-13 NOTE — Telephone Encounter (Signed)
She can increase to 2 tabs daily and see if this is more effective

## 2018-01-13 NOTE — Telephone Encounter (Signed)
Patient notified of PCP recommendations and is agreement and expresses an understanding.   Ok for [facility] to Discuss results / PCP recommendations / Schedule patient.   

## 2018-01-15 ENCOUNTER — Other Ambulatory Visit: Payer: Self-pay | Admitting: Family Medicine

## 2018-02-04 ENCOUNTER — Telehealth: Payer: Self-pay | Admitting: Family Medicine

## 2018-02-04 ENCOUNTER — Other Ambulatory Visit: Payer: Self-pay

## 2018-02-04 MED ORDER — TRAZODONE HCL 50 MG PO TABS: 25.0000 mg | ORAL_TABLET | Freq: Every evening | ORAL | 0 refills | Status: DC | PRN

## 2018-02-04 MED ORDER — TOPIRAMATE 200 MG PO TABS: 200.0000 mg | ORAL_TABLET | Freq: Two times a day (BID) | ORAL | 0 refills | Status: DC

## 2018-02-04 MED ORDER — LINACLOTIDE 72 MCG PO CAPS: 72.0000 ug | ORAL_CAPSULE | Freq: Every day | ORAL | 0 refills | Status: DC

## 2018-02-04 NOTE — Telephone Encounter (Signed)
Called patient and let her know that I have refilled the requested medications for her for the 90 day supply.

## 2018-02-04 NOTE — Telephone Encounter (Signed)
Last OV 12/31/17, No future OV  I have sent in the 90 day prescriptions for Topiramate 200 mg and Trazodone 50 mg.  Please advise if Linzess 72 mcg is okay to send a 90 day prescription or if you need to wait to see if the patient needs a different dose since recently started. Please advise.

## 2018-02-04 NOTE — Telephone Encounter (Signed)
Ok for 90 of the Linzess

## 2018-02-04 NOTE — Telephone Encounter (Signed)
Copied from CRM [phone]. Topic: Quick Communication - See Telephone Encounter >> Feb 04, 2018  1:46 PM Windy KalataMichael, Meghna Hagmann L, NT wrote: CRM for notification. See Telephone encounter for: 02/04/18.  Patient is calling and states she would like to switch all of her medicine to 90 day supply due to insurance purposes. She is needing the following refilled at the moment.  topiramate (TOPAMAX) 200 MG tablet traZODone (DESYREL) 50 MG tablet  linaclotide (LINZESS) 72 MCG capsule   Patient is aware they have refills but she said she wants them switched to 90 days instead. Please advise.  [facility] [place] [address] [place] [id number] Phone: [phone] Fax: [phone]

## 2018-02-06 IMAGING — CT CT RENAL STONE PROTOCOL
2 of 4 series · 16 of 46 positions shown, 18 images · non-contrast
Comparison: None.

CLINICAL DATA: Flank pain, stone disease suspected. Intermittent
left flank pain. Increased pain with urination.

EXAM:
CT ABDOMEN AND PELVIS WITHOUT CONTRAST
TECHNIQUE: Multidetector CT imaging of the abdomen and pelvis was performed
following the standard protocol without IV contrast.

[Series 2: axial st · axial · 0.65mm/px · z∈[-454,-39]mm · 13 of 91 slices shown, 15 images]
[im 4/91  soft-tissue]
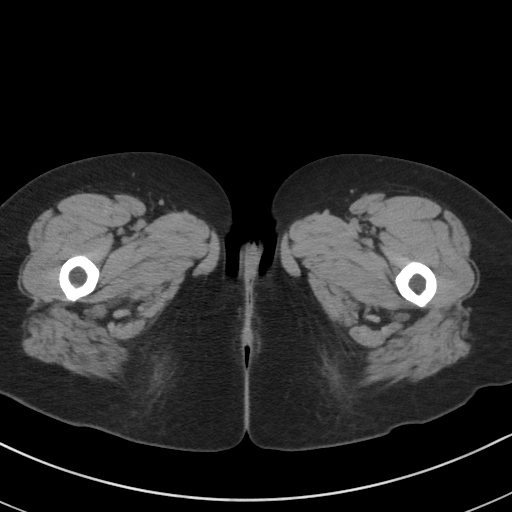
[im 4/91  bone]
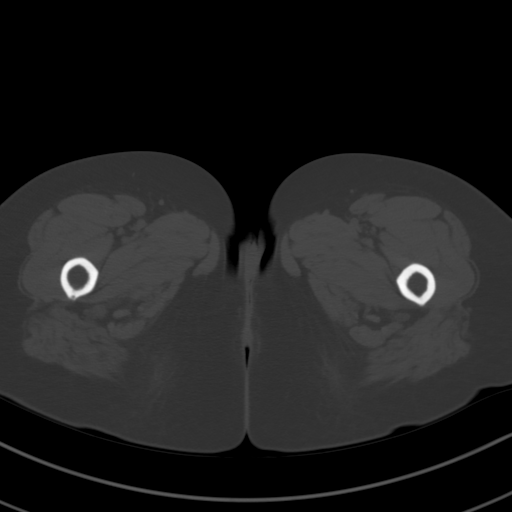
[im 11/91  soft-tissue]
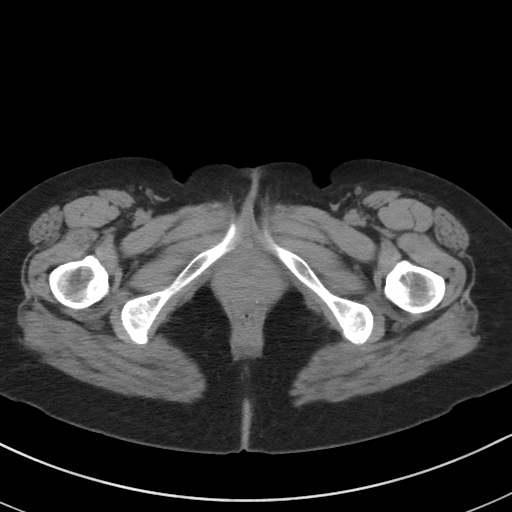
[im 18/91  soft-tissue]
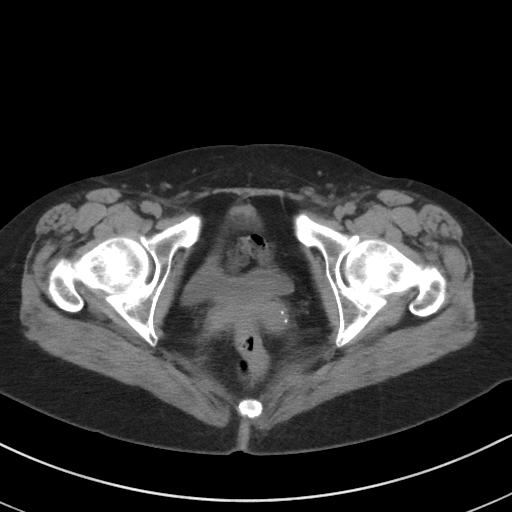
[im 25/91  soft-tissue]
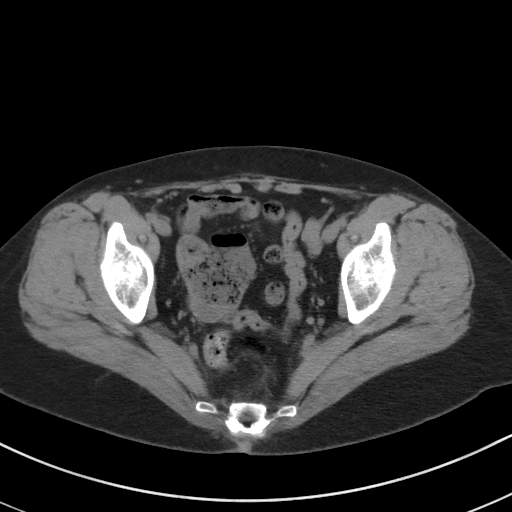
[im 32/91  soft-tissue]
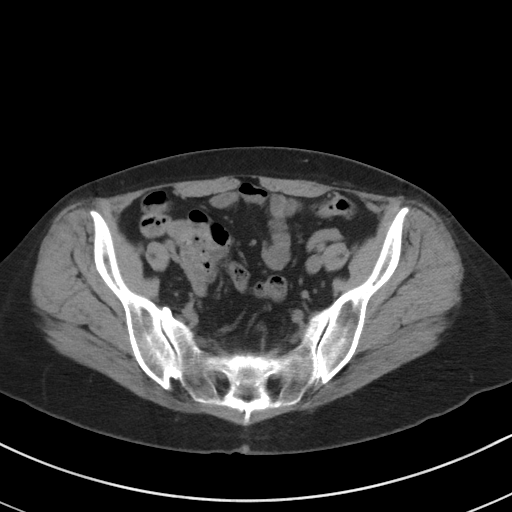
[im 39/91  soft-tissue]
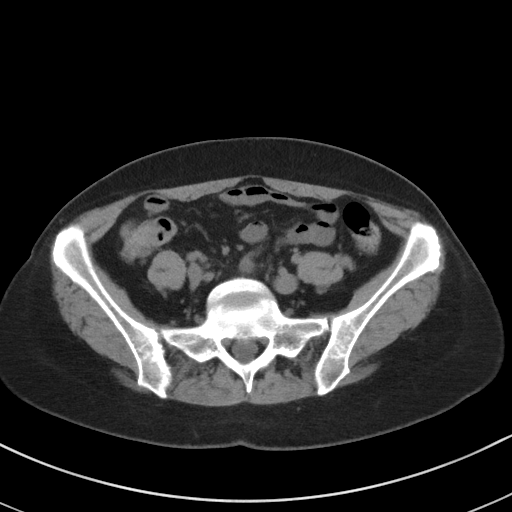
[im 46/91  soft-tissue]
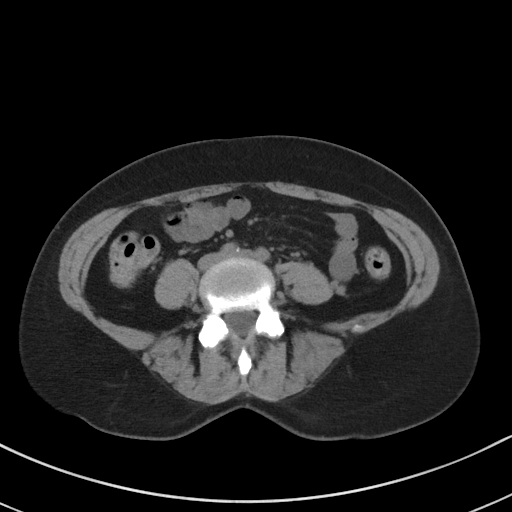
[im 52/91  soft-tissue]
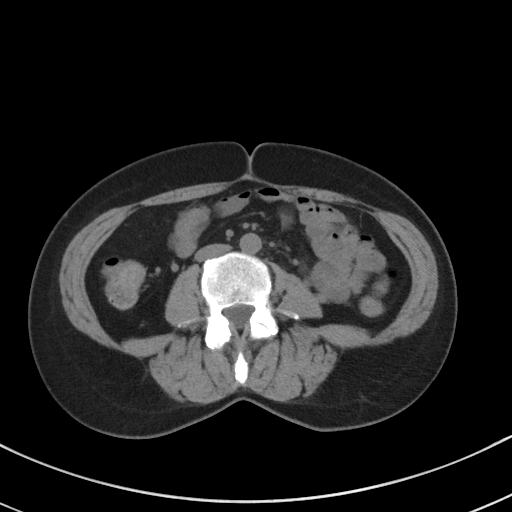
[im 59/91  soft-tissue]
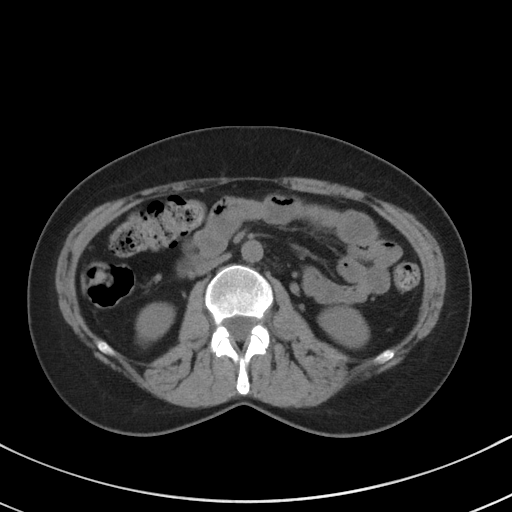
[im 59/91  bone]
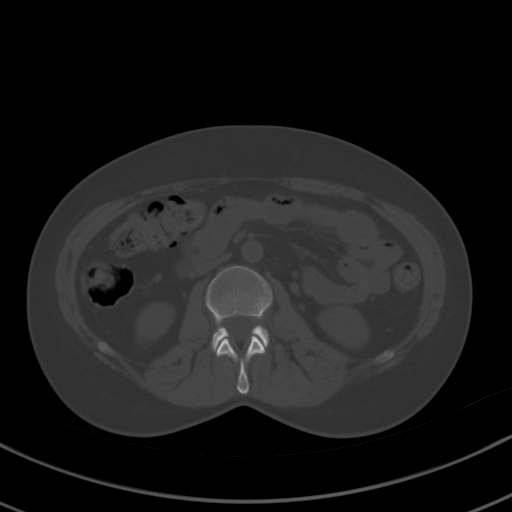
[im 66/91  soft-tissue]
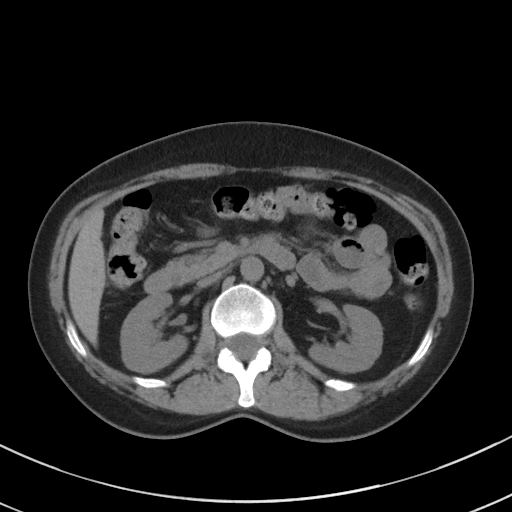
[im 73/91  soft-tissue]
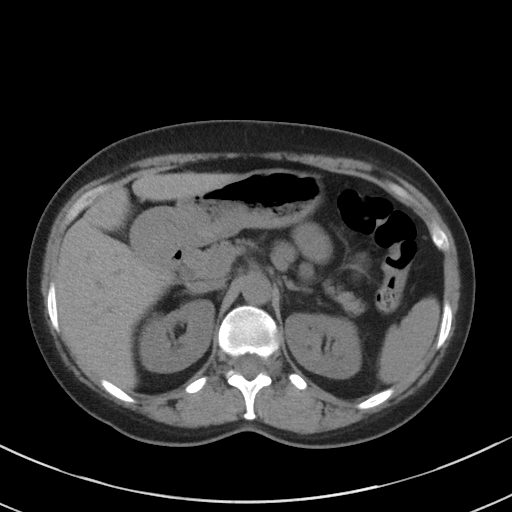
[im 80/91  soft-tissue]
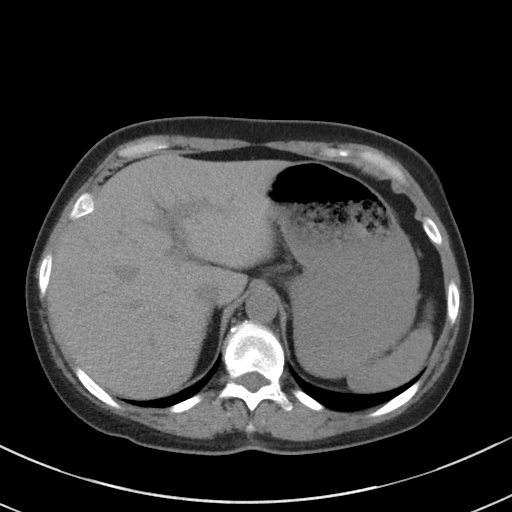
[im 87/91  soft-tissue]
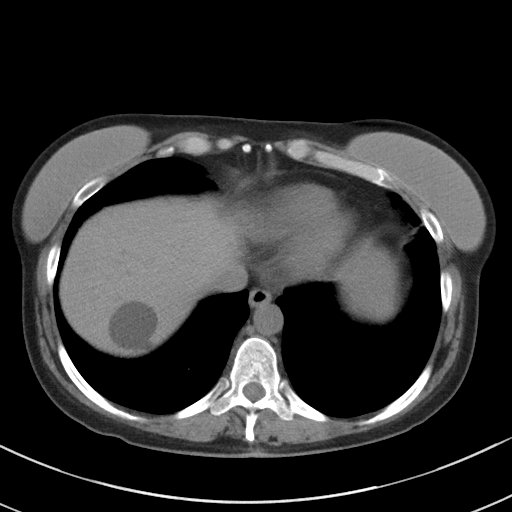

[Series 5: coronal st · coronal · 0.82mm/px · 3 of 69 slices shown]
[im 23/69  soft-tissue]
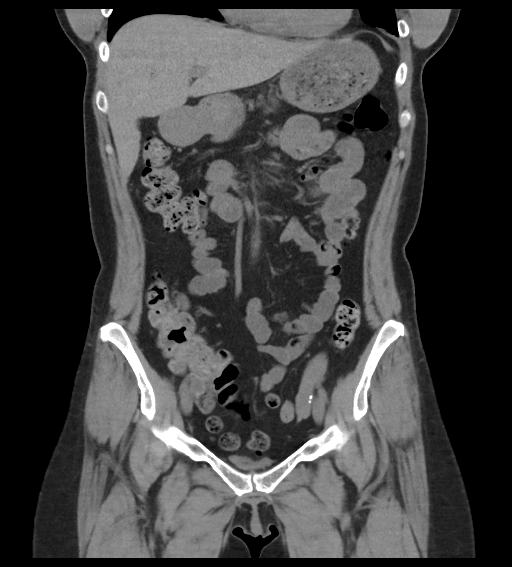
[im 31/69  soft-tissue]
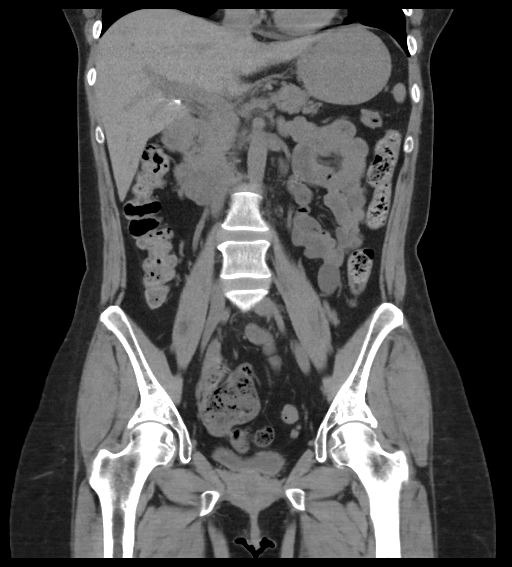
[im 38/69  soft-tissue]
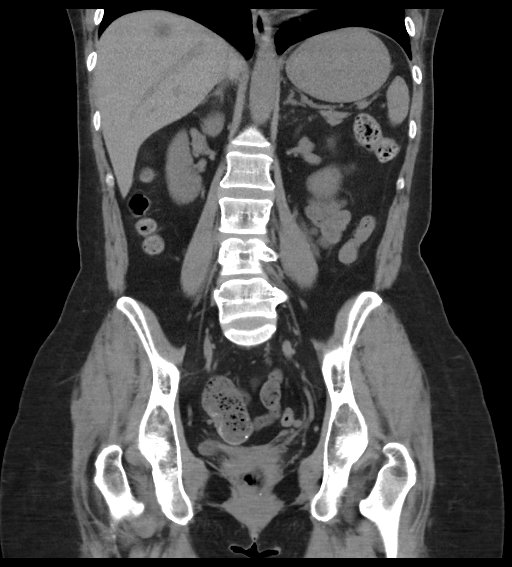

[16 of 46 positions shown; findings below may reference images not displayed]

FINDINGS: Lower chest: The lung bases are clear without focal nodule, mass, or
airspace disease. Heart size is normal. No significant pleural or
pericardial effusion is present.

Hepatobiliary: A benign hepatic cyst near the dome measures 3.1 cm.
A subcentimeter cyst is present within segment V on image 14 of
series 2. No other focal hepatic lesions are present.
Cholecystectomy is present. The common bile duct is within normal
limits.

Pancreas: Unremarkable. No pancreatic ductal dilatation or
surrounding inflammatory changes.

Spleen: Normal in size without focal abnormality.

Adrenals/Urinary Tract: Adrenal glands are normal bilaterally.
Kidneys and ureters are within normal limits. There is no stone or
mass lesion. There is no obstruction. The urinary bladder is within
normal limits.

Stomach/Bowel: Stomach is moderately distended. No obstructing
lesion is evident. Small bowel is unremarkable. The terminal ileum
is within normal limits. The appendix is visualized and within
normal limits. The ascending and transverse colon are within normal
limits. The descending and sigmoid colon are normal.

Vascular/Lymphatic: No significant vascular findings are present. No
enlarged abdominal or pelvic lymph nodes.

Reproductive: Status post hysterectomy. No adnexal masses.

Other: No abdominal wall hernia or abnormality. No abdominopelvic
ascites.

Musculoskeletal: Vertebral body heights and alignment are
maintained. No focal lytic or blastic lesions are present. Pelvis is
intact. Hips are located and within normal limits bilaterally.
IMPRESSION: 1. No nephrolithiasis or urinary tract obstruction.
2. Benign cysts of the liver.
3. No acute or focal lesion to explain the patient's intermittent
flank pain.

## 2018-03-02 ENCOUNTER — Other Ambulatory Visit: Payer: Self-pay | Admitting: Family Medicine

## 2018-03-04 ENCOUNTER — Encounter: Payer: Self-pay | Admitting: Neurology

## 2018-03-04 ENCOUNTER — Ambulatory Visit: Payer: 59 | Admitting: Neurology

## 2018-03-04 ENCOUNTER — Encounter

## 2018-03-04 VITALS — BP 109/72 | HR 75 | Ht 62.0 in | Wt 135.0 lb

## 2018-03-04 DIAGNOSIS — M79671 Pain in right foot: Secondary | ICD-10-CM

## 2018-03-04 DIAGNOSIS — R29898 Other symptoms and signs involving the musculoskeletal system: Secondary | ICD-10-CM | POA: Diagnosis not present

## 2018-03-04 DIAGNOSIS — G603 Idiopathic progressive neuropathy: Secondary | ICD-10-CM

## 2018-03-04 DIAGNOSIS — M79672 Pain in left foot: Secondary | ICD-10-CM | POA: Diagnosis not present

## 2018-03-04 NOTE — Progress Notes (Signed)
[facility]    Provider:  Dr Jaynee Eagles Referring Provider: Midge Minium, MD Primary Care Physician:  Midge Minium, MD  CC:  Neuropathy in both feet  HPI:  Martha Moss is a 52 y.o. female here as a referral from Dr. Birdie Riddle for peripheral neuropathy in the feet. PMHx migraine, fibromyalgia, depression, insomnia. She is on her feet a lot, Symptoms started a few years ago. Started becoming worse about 4 months ago, no inciting events. It was stinging, burning, pins and needles, severe pain to progress up her leg up the lateral side to the knee on the right, the left foot hurts worse. She went to urgent care, couldn't even touch her feet it was so painful, no FHx of neuropathy but mother had trouble with feet and legs. She feels it on the bottom. She was diagnosed with plantar fasciitis in the past.  She was not seen by a podiatrist but exercises in the past helped. She sometimes will cross legs. Gabapentin has helped. Making her tired. Worse with walking, standing and on her feet. Even the sheet touching it at night hurt, she can;t sleep due toe pain. No etoh, no pmhx of chemotherapy. She also has pain in the right hand and tingling in the fingers.   Reviewed notes, labs and imaging from outside physicians, which showed:  B12 low normal 333, cbc, bmp, tsh normal.  MRI of the lumbar spine was essentially normal, personally reviewed images.  Review of Systems: Patient complains of symptoms per HPI as well as the following symptoms: Fatigue, eye pain, swelling in legs, hearing loss, ringing in ears, trouble swallowing, constipation, joint pain, numbness, weakness, insomnia, anxiety. Pertinent negatives and positives per HPI. All others negative.   Social History   Socioeconomic History  . Marital status: Married    Spouse name: Not on file  . Number of children: Not on file  . Years of education: Not on file  . Highest education level: Not on file    Occupational History  . Not on file  Social Needs  . Financial resource strain: Not on file  . Food insecurity:    Worry: Not on file    Inability: Not on file  . Transportation needs:    Medical: Not on file    Non-medical: Not on file  Tobacco Use  . Smoking status: Never Smoker  . Smokeless tobacco: Never Used  Substance and Sexual Activity  . Alcohol use: Yes    Comment: rarely  . Drug use: No  . Sexual activity: Yes    Birth control/protection: Surgical  Lifestyle  . Physical activity:    Days per week: Not on file    Minutes per session: Not on file  . Stress: Not on file  Relationships  . Social connections:    Talks on phone: Not on file    Gets together: Not on file    Attends religious service: Not on file    Active member of club or organization: Not on file    Attends meetings of clubs or organizations: Not on file    Relationship status: Not on file  . Intimate partner violence:    Fear of current or ex partner: Not on file    Emotionally abused: Not on file    Physically abused: Not on file    Forced sexual activity: Not on file  Other Topics Concern  . Not on file  Social History Narrative  . Not on file  Family History  Problem Relation Age of Onset  . Kidney Stones Daughter   . Heart attack Father 62  . Diabetes Maternal Grandmother     Past Medical History:  Diagnosis Date  . Depression   . Fibromyalgia   . GERD (gastroesophageal reflux disease)   . Migraine   . PONV (postoperative nausea and vomiting)     Past Surgical History:  Procedure Laterality Date  . CHOLECYSTECTOMY    . ETHMOIDECTOMY Bilateral 09/09/2016   Procedure: ETHMOIDECTOMY;  Surgeon: Leta Baptist, MD;  Location: [place];  Service: ENT;  Laterality: Bilateral;  . FRONTAL SINUS EXPLORATION Bilateral 09/09/2016   Procedure: FRONTAL RECESS SINUS EXPLORATION;  Surgeon: Leta Baptist, MD;  Location: [place];  Service: ENT;  Laterality: Bilateral;   . MAXILLARY ANTROSTOMY Bilateral 09/09/2016   Procedure: MAXILLARY ANTROSTOMY WITH TISSUE REMOVAL;  Surgeon: Leta Baptist, MD;  Location: [place];  Service: ENT;  Laterality: Bilateral;  . OVARIAN CYST SURGERY    . SINUS ENDO W/FUSION Bilateral 09/09/2016   Procedure: ENDOSCOPIC SINUS SURGERY WITH NAVIGATION;  Surgeon: Leta Baptist, MD;  Location: [place];  Service: ENT;  Laterality: Bilateral;  . TOTAL VAGINAL HYSTERECTOMY    . TURBINATE REDUCTION Bilateral 09/09/2016   Procedure: BILATERAL TURBINATE REDUCTION;  Surgeon: Leta Baptist, MD;  Location: [place];  Service: ENT;  Laterality: Bilateral;    Current Outpatient Medications  Medication Sig Dispense Refill  . buPROPion (WELLBUTRIN XL) 300 MG 24 hr tablet TAKE 1 TABLET BY MOUTH ONCE DAILY 90 tablet 1  . gabapentin (NEURONTIN) 100 MG capsule Take 2 capsules (200 mg total) by mouth 2 (two) times daily. 120 capsule 1  . gabapentin (NEURONTIN) 300 MG capsule TAKE 1 CAPSULE BY MOUTH AT BEDTIME 30 capsule 0  . levocetirizine (XYZAL) 5 MG tablet Take 5 mg by mouth every evening.    . linaclotide (LINZESS) 72 MCG capsule Take 1 capsule (72 mcg total) by mouth daily before breakfast. 90 capsule 0  . magnesium 30 MG tablet Take 30 mg by mouth every other day.    . Multiple Vitamin (MULTIVITAMIN WITH MINERALS) TABS tablet Take 1 tablet by mouth daily.    Marland Kitchen topiramate (TOPAMAX) 200 MG tablet Take 1 tablet (200 mg total) by mouth 2 (two) times daily. 180 tablet 0  . traMADol (ULTRAM) 50 MG tablet Take 1 tablet (50 mg total) by mouth every 8 (eight) hours as needed. 20 tablet 0  . traZODone (DESYREL) 50 MG tablet Take 0.5-1 tablets (25-50 mg total) by mouth at bedtime as needed. for sleep 90 tablet 0  . vitamin E 1000 UNIT capsule Take 1,000 Units by mouth daily.     No current facility-administered medications for this visit.     Allergies as of 03/04/2018 - Review Complete 03/04/2018  Allergen Reaction Noted   . Bee venom  03/14/2013  . Azithromycin  10/19/2009  . Diphenhydramine hcl  10/19/2009  . Erythromycin  10/19/2009  . Latex  10/19/2009  . Morphine and related Nausea And Vomiting 12/22/2014  . Penicillins  10/19/2009  . Percocet [oxycodone-acetaminophen] Other (See Comments) 09/09/2016  . Sulfonamide derivatives  10/19/2009  . Tetracyclines & related  01/16/2012    Vitals: BP 109/72 (BP Location: Right Arm, Patient Position: Sitting, Cuff Size: Normal)   Pulse 75   Ht _0  (1.575 m)   Wt 135 lb (61.2 kg)   BMI 24.69 kg/m  Last Weight:  Wt Readings from  Last 1 Encounters:  03/04/18 135 lb (61.2 kg)   Last Height:   Ht Readings from Last 1 Encounters:  03/04/18 _0  (1.575 m)   Physical exam: Exam: Gen: NAD, conversant, well nourised, obese, well groomed                     CV: RRR, no MRG. No Carotid Bruits. No peripheral edema, warm, nontender Eyes: Conjunctivae clear without exudates or hemorrhage  Neuro: Detailed Neurologic Exam  Speech:    Speech is normal; fluent and spontaneous with normal comprehension.  Cognition:    The patient is oriented to person, place, and time;     recent and remote memory intact;     language fluent;     normal attention, concentration,     fund of knowledge Cranial Nerves:    The pupils are equal, round, and reactive to light. The fundi are normal and spontaneous venous pulsations are present. Visual fields are full to finger confrontation. Extraocular movements are intact. Trigeminal sensation is intact and the muscles of mastication are normal. The face is symmetric. The palate elevates in the midline. Hearing intact. Voice is normal. Shoulder shrug is normal. The tongue has normal motion without fasciculations.   Coordination:    Normal finger to nose and heel to shin. Normal rapid alternating movements.   Gait:    Heel-toe and tandem gait are normal.   Motor Observation:    No asymmetry, no atrophy, and no involuntary  movements noted. Tone:    Normal muscle tone.    Posture:    Posture is normal. normal erect    Strength:    Strength is V/V in the upper and lower limbs.      Sensation: intact to LT     Reflex Exam:  DTR's:    Deep tendon reflexes in the upper and lower extremities are normal bilaterally.   Toes:    The toes are downgoing bilaterally.   Clonus:    Clonus is absent.       Assessment/Plan:  52 year old with what appears to be a small finer neuropathy or plantar fasciitis or both  labwork today, extensive panel  Emg/ncs both legs and right hand Next consider skin biopsy and genetic panel She was diagnosed with plantar fasciitis in the past: She She was not seen by a podiatrist but exercises in the past helped.  Recommend podiatry.   Orders Placed This Encounter  Procedures  . Methylmalonic acid, serum  . B. burgdorfi Antibody  . ANA  . ANA, IFA (with reflex)  . Angiotensin converting enzyme  . Sedimentation rate  . Sjogren's syndrome antibods(ssa + ssb)  . Pan-ANCA  . Hepatitis C antibody  . Tissue transglutaminase, IgA  . Gliadin antibodies, serum  . Rheumatoid factor  . Heavy metals, blood  . Vitamin B6  . Multiple Myeloma Panel (SPEP&IFE w/QIG)  . Vitamin B1  . Hemoglobin A1c  . NCV with EMG(electromyography)     Sarina Ill, MD  [facility] [address] [place], [place] [address]  Phone [phone] Fax [phone]

## 2018-03-04 NOTE — Patient Instructions (Signed)
Peripheral Neuropathy Peripheral neuropathy is a type of nerve damage. It affects nerves that carry signals between the spinal cord and other parts of the body. These are called peripheral nerves. With peripheral neuropathy, one nerve or a group of nerves may be damaged. What are the causes? Many things can damage peripheral nerves. For some people with peripheral neuropathy, the cause is unknown. Some causes include:  Diabetes. This is the most common cause of peripheral neuropathy.  Injury to a nerve.  Pressure or stress on a nerve that lasts a long time.  Too little vitamin B. Alcoholism can lead to this.  Infections.  Autoimmune diseases, such as multiple sclerosis and systemic lupus erythematosus.  Inherited nerve diseases.  Some medicines, such as cancer drugs.  Toxic substances, such as lead and mercury.  Too little blood flowing to the legs.  Kidney disease.  Thyroid disease.  What are the signs or symptoms? Different people have different symptoms. The symptoms you have will depend on which of your nerves is damaged. Common symptoms include:  Loss of feeling (numbness) in the feet and hands.  Tingling in the feet and hands.  Pain that burns.  Very sensitive skin.  Weakness.  Not being able to move a part of the body (paralysis).  Muscle twitching.  Clumsiness or poor coordination.  Loss of balance.  Not being able to control your bladder.  Feeling dizzy.  Sexual problems.  How is this diagnosed? Peripheral neuropathy is a symptom, not a disease. Finding the cause of peripheral neuropathy can be hard. To figure that out, your health care provider will take a medical history and do a physical exam. A neurological exam will also be done. This involves checking things affected by your brain, spinal cord, and nerves (nervous system). For example, your health care provider will check your reflexes, how you move, and what you can feel. Other types of tests  may also be ordered, such as:  Blood tests.  A test of the fluid in your spinal cord.  Imaging tests, such as CT scans or an MRI.  Electromyography (EMG). This test checks the nerves that control muscles.  Nerve conduction velocity tests. These tests check how fast messages pass through your nerves.  Nerve biopsy. A small piece of nerve is removed. It is then checked under a microscope.  How is this treated?  Medicine is often used to treat peripheral neuropathy. Medicines may include: ? Pain-relieving medicines. Prescription or over-the-counter medicine may be suggested. ? Antiseizure medicine. This may be used for pain. ? Antidepressants. These also may help ease pain from neuropathy. ? Lidocaine. This is a numbing medicine. You might wear a patch or be given a shot. ? Mexiletine. This medicine is typically used to help control irregular heart rhythms.  Surgery. Surgery may be needed to relieve pressure on a nerve or to destroy a nerve that is causing pain.  Physical therapy to help movement.  Assistive devices to help movement. Follow these instructions at home:  Only take over-the-counter or prescription medicines as directed by your health care provider. Follow the instructions carefully for any given medicines. Do not take any other medicines without first getting approval from your health care provider.  If you have diabetes, work closely with your health care provider to keep your blood sugar under control.  If you have numbness in your feet: ? Check every day for signs of injury or infection. Watch for redness, warmth, and swelling. ? Wear padded socks and comfortable   shoes. These help protect your feet.  Do not do things that put pressure on your damaged nerve.  Do not smoke. Smoking keeps blood from getting to damaged nerves.  Avoid or limit alcohol. Too much alcohol can cause a lack of B vitamins. These vitamins are needed for healthy nerves.  Develop a good  support system. Coping with peripheral neuropathy can be stressful. Talk to a mental health specialist or join a support group if you are struggling.  Follow up with your health care provider as directed. Contact a health care provider if:  You have new signs or symptoms of peripheral neuropathy.  You are struggling emotionally from dealing with peripheral neuropathy.  You have a fever. Get help right away if:  You have an injury or infection that is not healing.  You feel very dizzy or begin vomiting.  You have chest pain.  You have trouble breathing. This information is not intended to replace advice given to you by your health care provider. Make sure you discuss any questions you have with your health care provider. Document Released: 07/18/2002 Document Revised: 01/03/2016 Document Reviewed: 04/04/2013 Elsevier Interactive Patient Education  2017 [employer]vier Inc.  

## 2018-03-06 ENCOUNTER — Other Ambulatory Visit: Payer: Self-pay | Admitting: Family Medicine

## 2018-03-08 LAB — PAN-ANCA
ANCA Proteinase 3: <3.5 U/mL (ref 0.0–3.5)
C-ANCA: <1:20 {titer}

## 2018-03-08 LAB — MULTIPLE MYELOMA PANEL, SERUM
ALBUMIN SERPL ELPH-MCNC: 4.2 g/dL (ref 2.9–4.4)
ALPHA2 GLOB SERPL ELPH-MCNC: 1 g/dL (ref 0.4–1.0)
Albumin/Glob SerPl: 1.3 (ref 0.7–1.7)
Alpha 1: 0.2 g/dL (ref 0.0–0.4)
B-Globulin SerPl Elph-Mcnc: 1.1 g/dL (ref 0.7–1.3)
Gamma Glob SerPl Elph-Mcnc: 1.1 g/dL (ref 0.4–1.8)
Globulin, Total: 3.4 g/dL (ref 2.2–3.9)
IGG (IMMUNOGLOBIN G), SERUM: 1129 mg/dL (ref 700–1600)
IGM (IMMUNOGLOBULIN M), SRM: 80 mg/dL (ref 26–217)
IgA/Immunoglobulin A, Serum: 145 mg/dL (ref 87–352)
TOTAL PROTEIN: 7.6 g/dL (ref 6.0–8.5)

## 2018-03-08 LAB — ANA: ANA Titer 1: NEGATIVE

## 2018-03-08 LAB — RHEUMATOID FACTOR: Rhuematoid fact SerPl-aCnc: <10 IU/mL (ref 0.0–13.9)

## 2018-03-08 LAB — HEAVY METALS, BLOOD
ARSENIC: 6 ug/L (ref 2–23)
Lead, Blood: NOT DETECTED ug/dL (ref 0–4)
Mercury: NOT DETECTED ug/L (ref 0.0–14.9)

## 2018-03-08 LAB — SJOGREN'S SYNDROME ANTIBODS(SSA + SSB)
ENA SSA (RO) Ab: <0.2 AI (ref 0.0–0.9)
ENA SSB (LA) Ab: <0.2 AI (ref 0.0–0.9)

## 2018-03-08 LAB — HEMOGLOBIN A1C
Est. average glucose Bld gHb Est-mCnc: 103 mg/dL
Hgb A1c MFr Bld: 5.2 % (ref 4.8–5.6)

## 2018-03-08 LAB — GLIADIN ANTIBODIES, SERUM
Antigliadin Abs, IgA: 3 units (ref 0–19)
Gliadin IgG: 4 units (ref 0–19)

## 2018-03-08 LAB — B. BURGDORFI ANTIBODIES

## 2018-03-08 LAB — VITAMIN B1: THIAMINE: 235.3 nmol/L — AB (ref 66.5–200.0)

## 2018-03-08 LAB — ANGIOTENSIN CONVERTING ENZYME: Angio Convert Enzyme: 15 U/L (ref 14–82)

## 2018-03-08 LAB — HEPATITIS C ANTIBODY: Hep C Virus Ab: <0.1 s/co ratio (ref 0.0–0.9)

## 2018-03-08 LAB — VITAMIN B6: VITAMIN B6: 47.3 ug/L — AB (ref 2.0–32.8)

## 2018-03-08 LAB — METHYLMALONIC ACID, SERUM: Methylmalonic Acid: 153 nmol/L (ref 0–378)

## 2018-03-08 LAB — SEDIMENTATION RATE: Sed Rate: 4 mm/hr (ref 0–40)

## 2018-03-08 LAB — TISSUE TRANSGLUTAMINASE, IGA: Transglutaminase IgA: <2 U/mL (ref 0–3)

## 2018-03-10 ENCOUNTER — Telehealth: Payer: Self-pay | Admitting: Neurology

## 2018-03-10 NOTE — Telephone Encounter (Signed)
Patient calling requesting lab results.

## 2018-03-10 NOTE — Telephone Encounter (Signed)
Called pt and informed her that Dr. Lucia GaskinsAhern had sent her a message to mychart with the labs that said that her labs looked fine. Pt stated she had forgotten her password. She was given the mychart help line number to call and get this resolved so she can continue using it. She verbalized appreciation.

## 2018-04-01 ENCOUNTER — Ambulatory Visit: Payer: 59 | Admitting: Neurology

## 2018-04-01 ENCOUNTER — Encounter

## 2018-04-01 ENCOUNTER — Ambulatory Visit (INDEPENDENT_AMBULATORY_CARE_PROVIDER_SITE_OTHER): Payer: 59 | Admitting: Neurology

## 2018-04-01 ENCOUNTER — Telehealth: Payer: Self-pay | Admitting: Neurology

## 2018-04-01 DIAGNOSIS — G603 Idiopathic progressive neuropathy: Secondary | ICD-10-CM

## 2018-04-01 DIAGNOSIS — M79672 Pain in left foot: Secondary | ICD-10-CM

## 2018-04-01 DIAGNOSIS — M79671 Pain in right foot: Secondary | ICD-10-CM

## 2018-04-01 DIAGNOSIS — R29898 Other symptoms and signs involving the musculoskeletal system: Secondary | ICD-10-CM

## 2018-04-01 DIAGNOSIS — Z0289 Encounter for other administrative examinations: Secondary | ICD-10-CM

## 2018-04-01 MED ORDER — TRAMADOL HCL 50 MG PO TABS: 50.0000 mg | ORAL_TABLET | Freq: Three times a day (TID) | ORAL | 0 refills | Status: DC | PRN

## 2018-04-01 NOTE — Progress Notes (Signed)
History: Martha Moss is a 52 y.o. female here as a referral from Dr. Birdie Riddle for peripheral neuropathy in the feet. PMHx migraine, fibromyalgia, depression, insomnia. She is on her feet a lot, Symptoms started a few years ago. Started becoming worse about 4 months ago, no inciting events. It was stinging, burning, pins and needles, severe pain to progress up her leg up the lateral side to the knee on the right, the left foot hurts worse. EMGNCS today normal.  Discussed Extensive testing below which was normal Discussed small-fiber biopsy If small fiber biopsy positive can consider genetic panel since no other causes found ONE time tramadol refill, she needs to see her other physician for this thanks   Normal Labs reviewed with patient  . Methylmalonic acid, serum  . B. burgdorfi Antibody  . ANA  . ANA, IFA (with reflex)  . Angiotensin converting enzyme  . Sedimentation rate  . Sjogren's syndrome antibods(ssa + ssb)  . Pan-ANCA  . Hepatitis C antibody  . Tissue transglutaminase, IgA  . Gliadin antibodies, serum  . Rheumatoid factor  . Heavy metals, blood  . Vitamin B6  . Multiple Myeloma Panel (SPEP&IFE w/QIG)  . Vitamin B1  . Hemoglobin A1c    A total of 25 minutes was spent face-to-face with this patient. Over half this time was spent on counseling patient on the  1. Idiopathic progressive neuropathy   2. Pain in both feet   3. Weakness of distal arms and legs    diagnosis and different diagnostic and therapeutic options, counseling and coordination of care, risks ans benefits of management, compliance, or risk factor reduction and education. This does not include time spent on emg/ncs procedure.

## 2018-04-01 NOTE — Telephone Encounter (Signed)
Dr. Yan is agreeable to complete the skin biopsy. 

## 2018-04-01 NOTE — Telephone Encounter (Signed)
Martha Moss,  Would Dr. Terrace ArabiaYan do a small-fiber biopsy for this patient please? First available is fine, there is no urgency thanks.

## 2018-04-06 ENCOUNTER — Telehealth: Payer: Self-pay

## 2018-04-06 NOTE — Telephone Encounter (Signed)
TC from pt stating osphena is no longer working for her  Pt requested a cream if possible.  Pt states her and husband have not been as active as much anymore due to his health issues and thinks a cream may be better Pt noted the have tried natural "oils"   Please advise.

## 2018-04-07 ENCOUNTER — Other Ambulatory Visit: Payer: Self-pay | Admitting: Obstetrics

## 2018-04-07 MED ORDER — ESTRADIOL 0.1 MG/GM VA CREA: TOPICAL_CREAM | VAGINAL | 12 refills | Status: DC

## 2018-04-07 NOTE — Telephone Encounter (Signed)
Estrace Rx for vaginal dryness and painful intercourse.

## 2018-04-07 NOTE — Telephone Encounter (Signed)
Pt notified and voiced understanding 

## 2018-04-12 NOTE — Progress Notes (Signed)
Full Name: Martha Moss Gender: Female MRN #: [id number] Date of Birth: 04/13/2066    Visit Date: 04/01/18 10:45 Age: 53 Years 4 Months Old Examining Physician: Naomie Dean, MD  Referring Physician: Neena Rhymes, MD  History: Bilateral foot pain, numbness and paresthesias  Summary: EMGNCS was performed on the bilateral lower extremities. All nerves and muscles (as detailed in the tables below) normal.    Conclusion: This is a normal study. No evidence for mononeuropathy, large-fiber polyneuropathy, radiculopathy. This test does not rule out small-fiber neuropathy.  Cc: Dr. Lindon Romp M.D.  [facility] [address] [place], [place] [address] Tel: [phone] Fax: [phone]        [facility]    Nerve / Sites Muscle Latency Ref. Amplitude Ref. Rel Amp Segments Distance Velocity Ref. Area    ms ms mV mV %  cm m/s m/s mVms  R Median - APB     Wrist APB 3.1 ?4.4 9.8 ?4.0 100 Wrist - APB 7   32.5     Upper arm APB 6.3  9.6  97.6 Upper arm - Wrist 20 62 ?49 31.9  R Ulnar - ADM     Wrist ADM 2.9 ?3.3 7.7 ?6.0 100 Wrist - ADM 7   12.8     B.Elbow ADM 5.7  7.8  101 B.Elbow - Wrist 18 64 ?49 13.2     A.Elbow ADM 7.3  7.3  93.4 A.Elbow - B.Elbow 10 64 ?49 12.0         A.Elbow - Wrist      R Peroneal - EDB     Ankle EDB 4.1 ?6.5 4.5 ?2.0 100 Ankle - EDB 9   13.8     Fib head EDB 9.8  4.3  94.7 Fib head - Ankle 27 47 ?44 13.6     Pop fossa EDB 11.8  4.2  98.3 Pop fossa - Fib head 10 51 ?44 13.2         Pop fossa - Ankle      L Peroneal - EDB     Ankle EDB 3.8 ?6.5 6.2 ?2.0 100 Ankle - EDB 9   18.3     Fib head EDB 9.5  5.7  91.4 Fib head - Ankle 27 47 ?44 17.0     Pop fossa EDB 11.6  5.6  99.1 Pop fossa - Fib head 10 48 ?44 18.8         Pop fossa - Ankle      R Tibial - AH     Ankle AH 4.0 ?5.8 14.7 ?4.0 100 Ankle - AH 9   30.2     Pop fossa AH 11.2  13.7  93.6 Pop fossa - Ankle 33 46 ?41 30.7  L Tibial - AH     Ankle AH 3.8 ?5.8 16.3  ?4.0 100 Ankle - AH 9   39.8     Pop fossa AH 11.4  12.9  78.8 Pop fossa - Ankle 33 44 ?41 32.6                 SNC    Nerve / Sites Rec. Site Peak Lat Ref.  Amp Ref. Segments Distance Peak Diff Ref.    ms ms V V  cm ms ms  R Sural - Ankle (Calf)     Calf Ankle 3.6 ?4.4 14 ?6 Calf - Ankle 14    L Sural - Ankle (Calf)  Calf Ankle 3.4 ?4.4 16 ?6 Calf - Ankle 14    R Superficial peroneal - Ankle     Lat leg Ankle 3.7 ?4.4 13 ?6 Lat leg - Ankle 14    L Superficial peroneal - Ankle     Lat leg Ankle 3.8 ?4.4 12 ?6 Lat leg - Ankle 14    R Median, Ulnar - Transcarpal comparison     Median Palm Wrist 2.1 ?2.2 52 ?35 Median Palm - Wrist 8       Ulnar Palm Wrist 2.2 ?2.2 20 ?12 Ulnar Palm - Wrist 8          Median Palm - Ulnar Palm  -0.1 ?0.4  R Median - Orthodromic (Dig II, Mid palm)     Dig II Wrist 3.0 ?3.4 24 ?10 Dig II - Wrist 13    R Ulnar - Orthodromic, (Dig V, Mid palm)     Dig V Wrist 2.9 ?3.1 18 ?5 Dig V - Wrist 45                     F  Wave    Nerve F Lat Ref.   ms ms  R Tibial - AH 47.2 ?56.0  L Tibial - AH 47.8 ?56.0  R Ulnar - ADM 24.0 ?32.0                EMG full       EMG Summary Table    Spontaneous MUAP Recruitment  Muscle IA Fib PSW Fasc Other Amp Dur. Poly Pattern  L. Vastus medialis Normal None None None _______ Normal Normal Normal Normal  R. Vastus medialis Normal None None None _______ Normal Normal Normal Normal  L. Tibialis anterior Normal None None None _______ Normal Normal Normal Normal  R. Tibialis anterior Normal None None None _______ Normal Normal Normal Normal  L. Gastrocnemius (Medial head) Normal None None None _______ Normal Normal Normal Normal  R. Gastrocnemius (Medial head) Normal None None None _______ Normal Normal Normal Normal  L. Extensor hallucis longus Normal None None None _______ Normal Normal Normal Normal  R. Extensor hallucis longus Normal None None None _______ Normal Normal Normal Normal  L. Abductor hallucis Normal  None None None _______ Normal Normal Normal Normal  R. Abductor hallucis Normal None None None _______ Normal Normal Normal Normal

## 2018-04-12 NOTE — Progress Notes (Signed)
See procedure note.

## 2018-04-12 NOTE — Procedures (Signed)
Full Name: Martha Moss Gender: Female MRN #: [id number] Date of Birth: [birth year]    Visit Date: 04/01/18 10:45 Age: 52 Years 4 Months Old Examining Physician: Naomie Dean, MD  Referring Physician: Neena Rhymes, MD  History: Bilateral foot pain, numbness and paresthesias  Summary: EMGNCS was performed on the bilateral lower extremities. All nerves and muscles (as detailed in the tables below) normal.    Conclusion: This is a normal study. No evidence for mononeuropathy, polyneuropathy, radiculopathy. This test does not rule out small-fiber neuropathy.  Cc: Dr. Lindon Romp M.D.  [facility] [address] [place], [place] [address] Tel: [phone] Fax: [phone]        [facility]    Nerve / Sites Muscle Latency Ref. Amplitude Ref. Rel Amp Segments Distance Velocity Ref. Area    ms ms mV mV %  cm m/s m/s mVms  R Median - APB     Wrist APB 3.1 ?4.4 9.8 ?4.0 100 Wrist - APB 7   32.5     Upper arm APB 6.3  9.6  97.6 Upper arm - Wrist 20 62 ?49 31.9  R Ulnar - ADM     Wrist ADM 2.9 ?3.3 7.7 ?6.0 100 Wrist - ADM 7   12.8     B.Elbow ADM 5.7  7.8  101 B.Elbow - Wrist 18 64 ?49 13.2     A.Elbow ADM 7.3  7.3  93.4 A.Elbow - B.Elbow 10 64 ?49 12.0         A.Elbow - Wrist      R Peroneal - EDB     Ankle EDB 4.1 ?6.5 4.5 ?2.0 100 Ankle - EDB 9   13.8     Fib head EDB 9.8  4.3  94.7 Fib head - Ankle 27 47 ?44 13.6     Pop fossa EDB 11.8  4.2  98.3 Pop fossa - Fib head 10 51 ?44 13.2         Pop fossa - Ankle      L Peroneal - EDB     Ankle EDB 3.8 ?6.5 6.2 ?2.0 100 Ankle - EDB 9   18.3     Fib head EDB 9.5  5.7  91.4 Fib head - Ankle 27 47 ?44 17.0     Pop fossa EDB 11.6  5.6  99.1 Pop fossa - Fib head 10 48 ?44 18.8         Pop fossa - Ankle      R Tibial - AH     Ankle AH 4.0 ?5.8 14.7 ?4.0 100 Ankle - AH 9   30.2     Pop fossa AH 11.2  13.7  93.6 Pop fossa - Ankle 33 46 ?41 30.7  L Tibial - AH     Ankle AH 3.8 ?5.8 16.3 ?4.0 100  Ankle - AH 9   39.8     Pop fossa AH 11.4  12.9  78.8 Pop fossa - Ankle 33 44 ?41 32.6                 SNC    Nerve / Sites Rec. Site Peak Lat Ref.  Amp Ref. Segments Distance Peak Diff Ref.    ms ms V V  cm ms ms  R Sural - Ankle (Calf)     Calf Ankle 3.6 ?4.4 14 ?6 Calf - Ankle 14    L Sural - Ankle (Calf)     Calf  Ankle 3.4 ?4.4 16 ?6 Calf - Ankle 14    R Superficial peroneal - Ankle     Lat leg Ankle 3.7 ?4.4 13 ?6 Lat leg - Ankle 14    L Superficial peroneal - Ankle     Lat leg Ankle 3.8 ?4.4 12 ?6 Lat leg - Ankle 14    R Median, Ulnar - Transcarpal comparison     Median Palm Wrist 2.1 ?2.2 52 ?35 Median Palm - Wrist 8       Ulnar Palm Wrist 2.2 ?2.2 20 ?12 Ulnar Palm - Wrist 8          Median Palm - Ulnar Palm  -0.1 ?0.4  R Median - Orthodromic (Dig II, Mid palm)     Dig II Wrist 3.0 ?3.4 24 ?10 Dig II - Wrist 13    R Ulnar - Orthodromic, (Dig V, Mid palm)     Dig V Wrist 2.9 ?3.1 18 ?5 Dig V - Wrist 92                     F  Wave    Nerve F Lat Ref.   ms ms  R Tibial - AH 47.2 ?56.0  L Tibial - AH 47.8 ?56.0  R Ulnar - ADM 24.0 ?32.0                EMG full       EMG Summary Table    Spontaneous MUAP Recruitment  Muscle IA Fib PSW Fasc Other Amp Dur. Poly Pattern  L. Vastus medialis Normal None None None _______ Normal Normal Normal Normal  R. Vastus medialis Normal None None None _______ Normal Normal Normal Normal  L. Tibialis anterior Normal None None None _______ Normal Normal Normal Normal  R. Tibialis anterior Normal None None None _______ Normal Normal Normal Normal  L. Gastrocnemius (Medial head) Normal None None None _______ Normal Normal Normal Normal  R. Gastrocnemius (Medial head) Normal None None None _______ Normal Normal Normal Normal  L. Extensor hallucis longus Normal None None None _______ Normal Normal Normal Normal  R. Extensor hallucis longus Normal None None None _______ Normal Normal Normal Normal  L. Abductor hallucis Normal None None  None _______ Normal Normal Normal Normal  R. Abductor hallucis Normal None None None _______ Normal Normal Normal Normal

## 2018-05-21 ENCOUNTER — Other Ambulatory Visit: Payer: Self-pay | Admitting: Family Medicine

## 2018-06-12 ENCOUNTER — Other Ambulatory Visit: Payer: Self-pay | Admitting: Family Medicine

## 2018-06-24 ENCOUNTER — Encounter (HOSPITAL_COMMUNITY): Payer: Self-pay | Admitting: Emergency Medicine

## 2018-06-24 ENCOUNTER — Emergency Department (HOSPITAL_COMMUNITY): Payer: Self-pay

## 2018-06-24 ENCOUNTER — Emergency Department (HOSPITAL_COMMUNITY)
Admission: EM | Admit: 2018-06-24 | Discharge: 2018-06-25 | Disposition: A | Discharge status: Home / Self Care | Payer: Self-pay | Attending: Emergency Medicine | Admitting: Emergency Medicine

## 2018-06-24 DIAGNOSIS — R112 Nausea with vomiting, unspecified: Secondary | ICD-10-CM | POA: Insufficient documentation

## 2018-06-24 DIAGNOSIS — R1011 Right upper quadrant pain: Secondary | ICD-10-CM | POA: Insufficient documentation

## 2018-06-24 DIAGNOSIS — R1013 Epigastric pain: Secondary | ICD-10-CM | POA: Insufficient documentation

## 2018-06-24 DIAGNOSIS — R0789 Other chest pain: Secondary | ICD-10-CM | POA: Insufficient documentation

## 2018-06-24 DIAGNOSIS — Z79899 Other long term (current) drug therapy: Secondary | ICD-10-CM | POA: Insufficient documentation

## 2018-06-24 LAB — BASIC METABOLIC PANEL
Anion gap: 8 (ref 5–15)
BUN: 20 mg/dL (ref 6–20)
CO2: 21 mmol/L — ABNORMAL LOW (ref 22–32)
Calcium: 9 mg/dL (ref 8.9–10.3)
Chloride: 113 mmol/L — ABNORMAL HIGH (ref 98–111)
Creatinine, Ser: 0.99 mg/dL (ref 0.44–1.00)
GFR calc Af Amer: >60 mL/min (ref >60)
Glucose, Bld: 106 mg/dL — ABNORMAL HIGH (ref 70–99)
POTASSIUM: 3.4 mmol/L — AB (ref 3.5–5.1)
SODIUM: 142 mmol/L (ref 135–145)

## 2018-06-24 LAB — CBC
HCT: 35.8 % — ABNORMAL LOW (ref 36.0–46.0)
HEMOGLOBIN: 11.2 g/dL — AB (ref 12.0–15.0)
MCH: 30.4 pg (ref 26.0–34.0)
MCHC: 31.3 g/dL (ref 30.0–36.0)
MCV: 97 fL (ref 80.0–100.0)
Platelets: 244 10*3/uL (ref 150–400)
RBC: 3.69 MIL/uL — ABNORMAL LOW (ref 3.87–5.11)
RDW: 12.7 % (ref 11.5–15.5)
WBC: 11.5 10*3/uL — ABNORMAL HIGH (ref 4.0–10.5)
nRBC: 0 % (ref 0.0–0.2)

## 2018-06-24 LAB — LIPASE, BLOOD: LIPASE: 32 U/L (ref 11–51)

## 2018-06-24 LAB — I-STAT TROPONIN, ED: TROPONIN I, POC: 0 ng/mL (ref 0.00–0.08)

## 2018-06-24 LAB — HEPATIC FUNCTION PANEL
ALT: 38 U/L (ref 0–44)
AST: 89 U/L — ABNORMAL HIGH (ref 15–41)
Albumin: 3.9 g/dL (ref 3.5–5.0)
Alkaline Phosphatase: 90 U/L (ref 38–126)
BILIRUBIN INDIRECT: 0.3 mg/dL (ref 0.3–0.9)
Bilirubin, Direct: 0.1 mg/dL (ref 0.0–0.2)
Total Bilirubin: 0.4 mg/dL (ref 0.3–1.2)
Total Protein: 6.7 g/dL (ref 6.5–8.1)

## 2018-06-24 LAB — I-STAT BETA HCG BLOOD, ED (MC, WL, AP ONLY): I-stat hCG, quantitative: 6.4 m[IU]/mL — ABNORMAL HIGH (ref <5)

## 2018-06-24 MED ORDER — ONDANSETRON 4 MG PO TBDP
4.0000 mg | ORAL_TABLET | Freq: Once | ORAL | Status: AC
  Administered 2018-06-24: 4 mg via ORAL
  Filled 2018-06-24: qty 1

## 2018-06-24 MED ORDER — FENTANYL CITRATE (PF) 100 MCG/2ML IJ SOLN
25.0000 ug | Freq: Once | INTRAMUSCULAR | Status: AC
  Administered 2018-06-24: 25 ug via INTRAVENOUS
  Filled 2018-06-24: qty 2

## 2018-06-24 MED ORDER — IOHEXOL 300 MG/ML  SOLN
100.0000 mL | Freq: Once | INTRAMUSCULAR | Status: AC | PRN
  Administered 2018-06-24: 100 mL via INTRAVENOUS

## 2018-06-24 NOTE — ED Triage Notes (Signed)
Pt reports sudden onset of chest tightness approx 1hr PTA. More epigastric in nature, 7/10. Hx GERD. Pt also reports SOB, nausea, belching in triage.

## 2018-06-24 NOTE — ED Notes (Signed)
Pt transported to xray 

## 2018-06-24 NOTE — ED Notes (Signed)
EDP made aware of pt's request for pain medication 

## 2018-06-24 NOTE — ED Provider Notes (Signed)
[facility] Provider Note   CSN: [id number] Arrival date & time: 06/24/18  1937     History   Chief Complaint Chief Complaint  Patient presents with  . Chest Pain    HPI Martha Moss is a 52 y.o. female.  HPI Patient presents with her husband who assists with the HPI. Until about 2 hours prior to ED arrival the patient was in her usual state of health aside from mild cold-like symptoms. She relatively suddenly developed pain in the epigastrium, with nausea. She had some generalized discomfort as well, as well as sense of chills. She did have episodes of vomiting, has had a reduction in the pain, though it remains moderate, sore, nonradiating, throughout the upper abdomen, center and right. She has history of prior cholecystectomy, no history of cardiac disease.  Past Medical History:  Diagnosis Date  . Depression   . Fibromyalgia   . GERD (gastroesophageal reflux disease)   . Migraine   . PONV (postoperative nausea and vomiting)     Patient Active Problem List   Diagnosis Date Noted  . Neuropathy of both feet 12/22/2017  . Physical exam 09/25/2016  . Chronic idiopathic constipation 10/13/2014  . Contact dermatitis due to jewelry 02/19/2014  . Acute sinusitis 11/19/2013  . Insomnia 06/22/2013  . Pain in right wrist 06/22/2013  . Soft tissue mass 06/22/2013  . Plantar fasciitis of left foot 03/14/2013  . Shaky 02/22/2013  . Depression with anxiety 10/28/2010  . Migraine without aura 10/28/2010  . GERD 10/28/2010  . CHEST PAIN, ATYPICAL, HX OF 03/27/2010  . ALLERGIC RHINITIS, SEASONAL 12/07/2009  . Fibromyalgia 10/26/2009  . POLYARTHRITIS 10/19/2009    Past Surgical History:  Procedure Laterality Date  . CHOLECYSTECTOMY    . ETHMOIDECTOMY Bilateral 09/09/2016   Procedure: ETHMOIDECTOMY;  Surgeon: Newman PiesSu Teoh, MD;  Location: [facility];  Service: ENT;  Laterality: Bilateral;  . FRONTAL SINUS  EXPLORATION Bilateral 09/09/2016   Procedure: FRONTAL RECESS SINUS EXPLORATION;  Surgeon: Newman PiesSu Teoh, MD;  Location: [facility];  Service: ENT;  Laterality: Bilateral;  . MAXILLARY ANTROSTOMY Bilateral 09/09/2016   Procedure: MAXILLARY ANTROSTOMY WITH TISSUE REMOVAL;  Surgeon: Newman PiesSu Teoh, MD;  Location: [facility];  Service: ENT;  Laterality: Bilateral;  . OVARIAN CYST SURGERY    . SINUS ENDO W/FUSION Bilateral 09/09/2016   Procedure: ENDOSCOPIC SINUS SURGERY WITH NAVIGATION;  Surgeon: Newman PiesSu Teoh, MD;  Location: [facility];  Service: ENT;  Laterality: Bilateral;  . TOTAL VAGINAL HYSTERECTOMY    . TURBINATE REDUCTION Bilateral 09/09/2016   Procedure: BILATERAL TURBINATE REDUCTION;  Surgeon: Newman PiesSu Teoh, MD;  Location:  SURGERY CENTER;  Service: ENT;  Laterality: Bilateral;     OB History    Gravida  3   Para  3   Term  3   Preterm      AB      Living  3     SAB      TAB      Ectopic      Multiple      Live Births  3            Home Medications    Prior to Admission medications   Medication Sig Start Date End Date Taking? Authorizing Provider  buPROPion (WELLBUTRIN XL) 300 MG 24 hr tablet TAKE 1 TABLET BY MOUTH ONCE DAILY 03/02/18  Yes Sheliah Hatchabori, Katherine E, MD  estradiol (ESTRACE VAGINAL) 0.1 MG/GM vaginal cream  2 grams intravaginally daily at bedtime for 2 weeks, then reduce to 1 gram intravaginally weekly at bedtime for maintenance. 04/07/18  Yes Brock Bad, MD  gabapentin (NEURONTIN) 100 MG capsule Take 2 capsules (200 mg total) by mouth 2 (two) times daily. 12/31/17  Yes Sheliah Hatch, MD  gabapentin (NEURONTIN) 300 MG capsule TAKE 1 CAPSULE BY MOUTH DAILY AT BEDTIME Patient taking differently: Take 300 mg by mouth at bedtime.  06/14/18  Yes Sheliah Hatch, MD  levocetirizine (XYZAL) 5 MG tablet Take 5 mg by mouth every evening.   Yes [provider]  linaclotide Karlene Einstein) 72 MCG capsule Take 1 capsule  (72 mcg total) by mouth daily before breakfast. 02/04/18  Yes Sheliah Hatch, MD  magnesium 30 MG tablet Take 30 mg by mouth every other day.   Yes [provider]  Multiple Vitamin (MULTIVITAMIN WITH MINERALS) TABS tablet Take 1 tablet by mouth daily.   Yes [provider]  topiramate (TOPAMAX) 200 MG tablet Take 1 tablet (200 mg total) by mouth 2 (two) times daily. 02/04/18  Yes Sheliah Hatch, MD  traMADol (ULTRAM) 50 MG tablet Take 1 tablet (50 mg total) by mouth every 8 (eight) hours as needed for severe pain. 04/01/18  Yes Anson Fret, MD  traZODone (DESYREL) 50 MG tablet TAKE 1/2 TO 1 TABLET BY MOUTH AT [facility] NEEDED FOR SLEEP Patient taking differently: Take 25-50 mg by mouth at bedtime as needed for sleep.  06/14/18  Yes Sheliah Hatch, MD  vitamin E 1000 UNIT capsule Take 1,000 Units by mouth daily.   Yes [provider]  gabapentin (NEURONTIN) 100 MG capsule Take 2 capsules by mouth twice daily Patient not taking: Reported on 06/24/2018 05/24/18   Sheliah Hatch, MD    Family History Family History  Problem Relation Age of Onset  . Kidney Stones Daughter   . Heart attack Father 38  . Diabetes Maternal Grandmother     Social History Social History   Tobacco Use  . Smoking status: Never Smoker  . Smokeless tobacco: Never Used  Substance Use Topics  . Alcohol use: Yes    Comment: rarely  . Drug use: No     Allergies   Bee venom; Azithromycin; Diphenhydramine hcl; Erythromycin; Latex; Morphine and related; Penicillins; Percocet [oxycodone-acetaminophen]; Sulfonamide derivatives; and Tetracyclines & related   Review of Systems Review of Systems  Constitutional:       Per HPI, otherwise negative  HENT:       Per HPI, otherwise negative  Respiratory:       Per HPI, otherwise negative  Cardiovascular:       Per HPI, otherwise negative  Gastrointestinal: Positive for nausea and vomiting.  Endocrine:       Negative  aside from HPI  Genitourinary:       Neg aside from HPI   Musculoskeletal:       Per HPI, otherwise negative  Skin: Negative.   Neurological: Negative for syncope.     Physical Exam Updated Vital Signs BP 110/74   Pulse 79   Temp 98.3 F (36.8 C) (Oral)   Resp 13   Ht 5\' 2"  (1.575 m)   Wt 59 kg   SpO2 99%   BMI 23.78 kg/m   Physical Exam  Constitutional: She is oriented to person, place, and time. She appears well-developed and well-nourished. No distress.  HENT:  Head: Normocephalic and atraumatic.  Eyes: Conjunctivae and EOM are normal.  Cardiovascular:  Normal rate and regular rhythm.  Pulmonary/Chest: Effort normal and breath sounds normal. No stridor. No respiratory distress.  Abdominal: She exhibits no distension.  Minimal ttp upper abd and RUQ  Musculoskeletal: She exhibits no edema.  Neurological: She is alert and oriented to person, place, and time. No cranial nerve deficit.  Skin: Skin is warm and dry.  Psychiatric: She has a normal mood and affect.  Nursing note and vitals reviewed.    ED Treatments / Results  Labs (all labs ordered are listed, but only abnormal results are displayed) Labs Reviewed  BASIC METABOLIC PANEL - Abnormal; Notable for the following components:      Result Value   Potassium 3.4 (*)    Chloride 113 (*)    CO2 21 (*)    Glucose, Bld 106 (*)    All other components within normal limits  CBC - Abnormal; Notable for the following components:   WBC 11.5 (*)    RBC 3.69 (*)    Hemoglobin 11.2 (*)    HCT 35.8 (*)    All other components within normal limits  HEPATIC FUNCTION PANEL - Abnormal; Notable for the following components:   AST 89 (*)    All other components within normal limits  I-STAT BETA HCG BLOOD, ED ([facility], [facility], [facility] ONLY) - Abnormal; Notable for the following components:   I-stat hCG, quantitative 6.4 (*)    All other components within normal limits  LIPASE, BLOOD  I-STAT TROPONIN, ED    EKG EKG  Interpretation  Date/Time:  Thursday June 24 2018 19:48:43 EST Ventricular Rate:  61 PR Interval:  148 QRS Duration: 88 QT Interval:  430 QTC Calculation: 432 R Axis:   93 Text Interpretation:  Normal sinus rhythm Rightward axis Low voltage QRS Nonspecific T wave abnormality Abnormal ECG Abnormal ekg Confirmed by Gerhard Munch [phone]) on 06/24/2018 8:44:55 PM   Radiology Dg Chest 2 View  Result Date: 06/24/2018 CLINICAL DATA:  Chest pain EXAM: CHEST - 2 VIEW COMPARISON:  None. FINDINGS: The heart size and mediastinal contours are within normal limits. Both lungs are clear. The visualized skeletal structures are unremarkable. IMPRESSION: No active cardiopulmonary disease. Electronically Signed   By: Marlan Palau M.D.   On: 06/24/2018 21:00   Ct Abdomen Pelvis W Contrast  Result Date: 06/24/2018 CLINICAL DATA:  Epigastric pain x2 days. EXAM: CT ABDOMEN AND PELVIS WITH CONTRAST TECHNIQUE: Multidetector CT imaging of the abdomen and pelvis was performed using the standard protocol following bolus administration of intravenous contrast. CONTRAST:  OMNIPAQUE IOHEXOL 300 MG/ML  SOLN COMPARISON:  08/23/2017 FINDINGS: LOWER CHEST: Lung bases are clear. Included heart size is normal. No pericardial effusion. Partially included bilateral breast implants. HEPATOBILIARY: Stable benign-appearing hepatic cysts in the right hepatic lobe measuring up to 3.6 cm. Cholecystectomy. PANCREAS: Normal. SPLEEN: Normal. ADRENALS/URINARY TRACT: Kidneys are orthotopic, demonstrating symmetric enhancement. No nephrolithiasis, hydronephrosis or solid renal masses. Symmetric pyelograms on repeat delayed imaging. Urinary bladder is partially distended and unremarkable. Normal adrenal glands. STOMACH/BOWEL: Physiologic distention of the stomach. Normal small bowel rotation without obstruction. Increased stool retention within the colon consistent constipation. Normal-appearing appendix. VASCULAR/LYMPHATIC:  Aortoiliac vessels are normal in course and caliber. No lymphadenopathy by CT size criteria. REPRODUCTIVE: Hysterectomy.  No adnexal mass. OTHER: No intraperitoneal free fluid or free air. MUSCULOSKELETAL: Nonacute. IMPRESSION: 1. Increased colonic stool burden consistent with constipation. No bowel obstruction or inflammation. 2. Stable hepatic cysts. Electronically Signed   By: Tollie Eth M.D.   On: 06/24/2018 22:47  Procedures Procedures (including critical care time)  Medications Ordered in ED Medications  ondansetron (ZOFRAN-ODT) disintegrating tablet 4 mg (4 mg Oral Given 06/24/18 1948)  fentaNYL (SUBLIMAZE) injection 25 mcg (25 mcg Intravenous Given 06/24/18 2144)  iohexol (OMNIPAQUE) 300 MG/ML solution 100 mL (100 mLs Intravenous Contrast Given 06/24/18 2221)     Initial Impression / Assessment and Plan / ED Course  I have reviewed the triage vital signs and the nursing notes.  Pertinent labs & imaging results that were available during my care of the patient were reviewed by me and considered in my medical decision making (see chart for details).     11:56 PM Patient in no distress, awake, alert. She does have some ongoing discomfort, but no substantial pain. She is moderately hypotensive, though no substantial change from arrival, and no lightheadedness. We reviewed all findings, including reassuring labs, nonischemic EKG and troponin value. Patient CT scan is consistent with constipation, and now, on further consideration, the patient states that she has history of inconsistent bowel habits, has had constipation for years, is currently attempting use Linzess for relief, though with inconsistent benefit, due to necessity to balance her bowel movement habits with work. We discussed reassuring findings, absence of risk factors for ongoing disease, and discussed possibility of increasing a bowel movement regimen, being sure to follow-up with primary care or GI. We discussed  additional monitoring given her presentation for chest pain, but less low risk profile, reassuring initial values, patient will follow-up as an outpatient.  Final Clinical Impressions(s) / ED Diagnoses   Final diagnoses:  Atypical chest pain     Gerhard Munch, MD 06/25/18 0003

## 2018-06-24 NOTE — ED Notes (Signed)
Pt back from CT

## 2018-06-24 NOTE — ED Notes (Signed)
Pt transported to CT ?

## 2018-06-25 MED ORDER — MAGNESIUM CITRATE PO SOLN: 1.0000 | Freq: Once | ORAL | 3 refills | Status: AC

## 2018-06-25 NOTE — Discharge Instructions (Signed)
As discussed, your evaluation today has been largely reassuring.  But, it is important that you monitor your condition carefully, and do not hesitate to return to the ED if you develop new, or concerning changes in your condition. ? ?Otherwise, please follow-up with your physician for appropriate ongoing care. ? ?

## 2018-09-08 ENCOUNTER — Ambulatory Visit (INDEPENDENT_AMBULATORY_CARE_PROVIDER_SITE_OTHER): Payer: Self-pay | Admitting: Physician Assistant

## 2018-09-08 ENCOUNTER — Other Ambulatory Visit: Payer: Self-pay

## 2018-09-08 ENCOUNTER — Encounter: Payer: Self-pay | Admitting: Physician Assistant

## 2018-09-08 VITALS — BP 120/80 | HR 95 | Temp 98.6°F | Resp 16 | Ht 62.0 in | Wt 127.0 lb

## 2018-09-08 DIAGNOSIS — J0100 Acute maxillary sinusitis, unspecified: Secondary | ICD-10-CM

## 2018-09-08 DIAGNOSIS — R509 Fever, unspecified: Secondary | ICD-10-CM

## 2018-09-08 LAB — POC INFLUENZA A&B (BINAX/QUICKVUE)
Influenza A, POC: NEGATIVE
Influenza B, POC: NEGATIVE

## 2018-09-08 MED ORDER — CEFPODOXIME PROXETIL 200 MG PO TABS: 200.0000 mg | ORAL_TABLET | Freq: Two times a day (BID) | ORAL | 0 refills | Status: DC

## 2018-09-08 MED ORDER — BENZONATATE 100 MG PO CAPS: 100.0000 mg | ORAL_CAPSULE | Freq: Three times a day (TID) | ORAL | 0 refills | Status: DC | PRN

## 2018-09-08 NOTE — Progress Notes (Signed)
Patient presents to clinic today c/o several weeks of cough and congestion, initially productive with sinus pressure, body aches and chills. Was seen at [facility] at that time and diagnosed with the flu. Notes symptoms resolved except for cough, nasal congestion and sinus pressure which she has been dealing with. Notes over the past several days symptoms have acutely worsened. Now with intermittent fever, maxillary sinus and tooth pain. Denies recent travel or sick contact.    Past Medical History:  Diagnosis Date  . Depression   . Fibromyalgia   . GERD (gastroesophageal reflux disease)   . Migraine   . PONV (postoperative nausea and vomiting)     Current Outpatient Medications on File Prior to Visit  Medication Sig Dispense Refill  . buPROPion (WELLBUTRIN XL) 300 MG 24 hr tablet TAKE 1 TABLET BY MOUTH ONCE DAILY 90 tablet 1  . estradiol (ESTRACE VAGINAL) 0.1 MG/GM vaginal cream 2 grams intravaginally daily at bedtime for 2 weeks, then reduce to 1 gram intravaginally weekly at bedtime for maintenance. 42.5 g 12  . gabapentin (NEURONTIN) 300 MG capsule TAKE 1 CAPSULE BY MOUTH DAILY AT BEDTIME (Patient taking differently: Take 300 mg by mouth at bedtime. ) 30 capsule 2  . levocetirizine (XYZAL) 5 MG tablet Take 5 mg by mouth every evening.    . linaclotide (LINZESS) 72 MCG capsule Take 1 capsule (72 mcg total) by mouth daily before breakfast. 90 capsule 0  . magnesium 30 MG tablet Take 30 mg by mouth every other day.    . Multiple Vitamin (MULTIVITAMIN WITH MINERALS) TABS tablet Take 1 tablet by mouth daily.    Marland Kitchen topiramate (TOPAMAX) 200 MG tablet Take 1 tablet (200 mg total) by mouth 2 (two) times daily. 180 tablet 0  . traMADol (ULTRAM) 50 MG tablet Take 1 tablet (50 mg total) by mouth every 8 (eight) hours as needed for severe pain. 20 tablet 0  . traZODone (DESYREL) 50 MG tablet TAKE 1/2 TO 1 TABLET BY MOUTH AT [facility] NEEDED FOR SLEEP (Patient taking differently: Take 25-50 mg by mouth at  bedtime as needed for sleep. ) 30 tablet 2  . vitamin E 1000 UNIT capsule Take 1,000 Units by mouth daily.    Marland Kitchen gabapentin (NEURONTIN) 100 MG capsule Take 2 capsules (200 mg total) by mouth 2 (two) times daily. (Patient not taking: Reported on 09/08/2018) 120 capsule 1   No current facility-administered medications on file prior to visit.     Allergies  Allergen Reactions  . Bee Venom   . Azithromycin     REACTION: hives  . Diphenhydramine Hcl     REACTION: hives  . Erythromycin     REACTION: hives  . Latex     REACTION: rash  . Morphine And Related Nausea And Vomiting  . Penicillins     REACTION: rash, fever  . Percocet [Oxycodone-Acetaminophen] Other (See Comments)    Hallucinations   . Sulfonamide Derivatives     REACTION: rash, fever  . Tetracyclines & Related     Family History  Problem Relation Age of Onset  . Kidney Stones Daughter   . Heart attack Father 59  . Diabetes Maternal Grandmother     Social History   Socioeconomic History  . Marital status: Married    Spouse name: Not on file  . Number of children: Not on file  . Years of education: Not on file  . Highest education level: Not on file  Occupational History  . Not on file  Social Needs  . Financial resource strain: Not on file  . Food insecurity:    Worry: Not on file    Inability: Not on file  . Transportation needs:    Medical: Not on file    Non-medical: Not on file  Tobacco Use  . Smoking status: Never Smoker  . Smokeless tobacco: Never Used  Substance and Sexual Activity  . Alcohol use: Yes    Comment: rarely  . Drug use: No  . Sexual activity: Yes    Birth control/protection: Surgical  Lifestyle  . Physical activity:    Days per week: Not on file    Minutes per session: Not on file  . Stress: Not on file  Relationships  . Social connections:    Talks on phone: Not on file    Gets together: Not on file    Attends religious service: Not on file    Active member of club or  organization: Not on file    Attends meetings of clubs or organizations: Not on file    Relationship status: Not on file  Other Topics Concern  . Not on file  Social History Narrative  . Not on file    Review of Systems - See HPI.  All other ROS are negative.  BP 120/80   Pulse 95   Temp 98.6 F (37 C) (Oral)   Resp 16   Ht 5' 2"  (1.575 m)   Wt 127 lb (57.6 kg)   SpO2 99%   BMI 23.23 kg/m   Physical Exam Vitals signs reviewed.  Constitutional:      Appearance: Normal appearance.  HENT:     Head: Normocephalic and atraumatic.     Right Ear: Tympanic membrane normal.     Left Ear: Tympanic membrane normal.     Nose: Congestion present.     Right Sinus: Maxillary sinus tenderness present. No frontal sinus tenderness.     Left Sinus: Maxillary sinus tenderness present. No frontal sinus tenderness.     Mouth/Throat:     Mouth: Mucous membranes are moist.  Eyes:     Conjunctiva/sclera: Conjunctivae normal.  Neck:     Musculoskeletal: Neck supple.  Cardiovascular:     Rate and Rhythm: Normal rate and regular rhythm.     Pulses: Normal pulses.     Heart sounds: Normal heart sounds.  Pulmonary:     Effort: Pulmonary effort is normal.     Breath sounds: Normal breath sounds.  Lymphadenopathy:     Cervical: No cervical adenopathy.  Neurological:     Mental Status: She is alert.     Recent Results (from the past 2160 hour(s))  Basic metabolic panel     Status: Abnormal   Collection Time: 06/24/18  8:00 PM  Result Value Ref Range   Sodium 142 135 - 145 mmol/L   Potassium 3.4 (L) 3.5 - 5.1 mmol/L   Chloride 113 (H) 98 - 111 mmol/L   CO2 21 (L) 22 - 32 mmol/L   Glucose, Bld 106 (H) 70 - 99 mg/dL   BUN 20 6 - 20 mg/dL   Creatinine, Ser 0.99 0.44 - 1.00 mg/dL   Calcium 9.0 8.9 - 10.3 mg/dL   GFR calc non Af Amer >60 >60 mL/min   GFR calc Af Amer >60 >60 mL/min    Comment: (NOTE) The eGFR has been calculated using the CKD EPI equation. This calculation has not been  validated in all clinical situations. eGFR's persistently <60 mL/min signify possible  Chronic Kidney Disease.    Anion gap 8 5 - 15    Comment: Performed at [facility] [address] [place], [place] [address]  CBC     Status: Abnormal   Collection Time: 06/24/18  8:00 PM  Result Value Ref Range   WBC 11.5 (H) 4.0 - 10.5 K/uL   RBC 3.69 (L) 3.87 - 5.11 MIL/uL   Hemoglobin 11.2 (L) 12.0 - 15.0 g/dL   HCT 35.8 (L) 36.0 - 46.0 %   MCV 97.0 80.0 - 100.0 fL   MCH 30.4 26.0 - 34.0 pg   MCHC 31.3 30.0 - 36.0 g/dL   RDW 12.7 11.5 - 15.5 %   Platelets 244 150 - 400 K/uL   nRBC 0.0 0.0 - 0.2 %    Comment: Performed at [facility], [place] [address], [place], [place] [address]  Lipase, blood     Status: None   Collection Time: 06/24/18  8:00 PM  Result Value Ref Range   Lipase 32 11 - 51 U/L    Comment: Performed at [facility], [place] [address], [place], [place] [address]  Hepatic function panel     Status: Abnormal   Collection Time: 06/24/18  8:00 PM  Result Value Ref Range   Total Protein 6.7 6.5 - 8.1 g/dL   Albumin 3.9 3.5 - 5.0 g/dL   AST 89 (H) 15 - 41 U/L   ALT 38 0 - 44 U/L   Alkaline Phosphatase 90 38 - 126 U/L   Total Bilirubin 0.4 0.3 - 1.2 mg/dL   Bilirubin, Direct 0.1 0.0 - 0.2 mg/dL   Indirect Bilirubin 0.3 0.3 - 0.9 mg/dL    Comment: Performed at [facility] [address] [place], [place] [address]  I-stat troponin, ED     Status: None   Collection Time: 06/24/18  8:17 PM  Result Value Ref Range   Troponin i, poc 0.00 0.00 - 0.08 ng/mL   Comment 3            Comment: Due to the release kinetics of cTnI, a negative result within the first hours of the onset of symptoms does not rule out myocardial infarction with certainty. If myocardial infarction is still suspected, repeat the test at appropriate intervals.   I-Stat beta hCG blood, ED     Status: Abnormal   Collection Time: 06/24/18  8:17 PM  Result Value Ref Range   I-stat hCG,  quantitative 6.4 (H) <5 mIU/mL   Comment 3            Comment:   GEST. AGE      CONC.  (mIU/mL)   <=1 WEEK        5 - 50     2 WEEKS       50 - 500     3 WEEKS       100 - 10,000     4 WEEKS     1,000 - 30,000        FEMALE AND NON-PREGNANT FEMALE:     LESS THAN 5 mIU/mL   POC Influenza A&B(BINAX/QUICKVUE)     Status: Normal   Collection Time: 09/08/18  4:28 PM  Result Value Ref Range   Influenza A, POC Negative Negative   Influenza B, POC Negative Negative    Assessment/Plan: 1. Acute non-recurrent maxillary sinusitis Double worsening. Flu swab obtained just to make sure no flu. Negative. Patient with multiple allergies. Trying  to avoid FQ due to side effects and black box warnings. Rx Vantin BID x 10 days.Increase fluids.  Rest.  Saline nasal spray.  Probiotic.  Mucinex as directed.  Humidifier in bedroom.Call or return to clinic if symptoms are not improving.  - cefpodoxime (VANTIN) 200 MG tablet; Take 1 tablet (200 mg total) by mouth 2 (two) times daily.  Dispense: 20 tablet; Refill: 0    Leeanne Rio, PA-C

## 2018-09-08 NOTE — Patient Instructions (Addendum)
Please take antibiotic as directed.  Increase fluid intake.  Use Saline nasal spray.  Take a daily multivitamin. Use the Tessalon for cough.  Place a humidifier in the bedroom.  Please call or return clinic if symptoms are not improving.  Sinusitis Sinusitis is redness, soreness, and swelling (inflammation) of the paranasal sinuses. Paranasal sinuses are air pockets within the bones of your face (beneath the eyes, the middle of the forehead, or above the eyes). In healthy paranasal sinuses, mucus is able to drain out, and air is able to circulate through them by way of your nose. However, when your paranasal sinuses are inflamed, mucus and air can become trapped. This can allow bacteria and other germs to grow and cause infection. Sinusitis can develop quickly and last only a short time (acute) or continue over a long period (chronic). Sinusitis that lasts for more than 12 weeks is considered chronic.  CAUSES  Causes of sinusitis include:  Allergies.  Structural abnormalities, such as displacement of the cartilage that separates your nostrils (deviated septum), which can decrease the air flow through your nose and sinuses and affect sinus drainage.  Functional abnormalities, such as when the small hairs (cilia) that line your sinuses and help remove mucus do not work properly or are not present. SYMPTOMS  Symptoms of acute and chronic sinusitis are the same. The primary symptoms are pain and pressure around the affected sinuses. Other symptoms include:  Upper toothache.  Earache.  Headache.  Bad breath.  Decreased sense of smell and taste.  A cough, which worsens when you are lying flat.  Fatigue.  Fever.  Thick drainage from your nose, which often is green and may contain pus (purulent).  Swelling and warmth over the affected sinuses. DIAGNOSIS  Your caregiver will perform a physical exam. During the exam, your caregiver may:  Look in your nose for signs of abnormal growths in  your nostrils (nasal polyps).  Tap over the affected sinus to check for signs of infection.  View the inside of your sinuses (endoscopy) with a special imaging device with a light attached (endoscope), which is inserted into your sinuses. If your caregiver suspects that you have chronic sinusitis, one or more of the following tests may be recommended:  Allergy tests.  Nasal culture A sample of mucus is taken from your nose and sent to a lab and screened for bacteria.  Nasal cytology A sample of mucus is taken from your nose and examined by your caregiver to determine if your sinusitis is related to an allergy. TREATMENT  Most cases of acute sinusitis are related to a viral infection and will resolve on their own within 10 days. Sometimes medicines are prescribed to help relieve symptoms (pain medicine, decongestants, nasal steroid sprays, or saline sprays).  However, for sinusitis related to a bacterial infection, your caregiver will prescribe antibiotic medicines. These are medicines that will help kill the bacteria causing the infection.  Rarely, sinusitis is caused by a fungal infection. In theses cases, your caregiver will prescribe antifungal medicine. For some cases of chronic sinusitis, surgery is needed. Generally, these are cases in which sinusitis recurs more than 3 times per year, despite other treatments. HOME CARE INSTRUCTIONS   Drink plenty of water. Water helps thin the mucus so your sinuses can drain more easily.  Use a humidifier.  Inhale steam 3 to 4 times a day (for example, sit in the bathroom with the shower running).  Apply a warm, moist washcloth to your face 3  to 4 times a day, or as directed by your caregiver.  Use saline nasal sprays to help moisten and clean your sinuses.  Take over-the-counter or prescription medicines for pain, discomfort, or fever only as directed by your caregiver. SEEK IMMEDIATE MEDICAL CARE IF:  You have increasing pain or severe  headaches.  You have nausea, vomiting, or drowsiness.  You have swelling around your face.  You have vision problems.  You have a stiff neck.  You have difficulty breathing. MAKE SURE YOU:   Understand these instructions.  Will watch your condition.  Will get help right away if you are not doing well or get worse. Document Released: 07/28/2005 Document Revised: 10/20/2011 Document Reviewed: 08/12/2011 [facility] Patient Information 2014 [place], [place].  s

## 2018-09-22 ENCOUNTER — Other Ambulatory Visit: Payer: Self-pay | Admitting: Family Medicine

## 2018-10-22 ENCOUNTER — Other Ambulatory Visit: Payer: Self-pay | Admitting: Family Medicine

## 2018-12-23 ENCOUNTER — Other Ambulatory Visit: Payer: Self-pay | Admitting: Family Medicine

## 2018-12-29 ENCOUNTER — Other Ambulatory Visit: Payer: Self-pay

## 2018-12-29 ENCOUNTER — Encounter: Payer: Self-pay | Admitting: Family Medicine

## 2018-12-29 ENCOUNTER — Ambulatory Visit (INDEPENDENT_AMBULATORY_CARE_PROVIDER_SITE_OTHER): Payer: Self-pay | Admitting: Family Medicine

## 2018-12-29 VITALS — Temp 100.1°F | Ht 62.0 in | Wt 126.0 lb

## 2018-12-29 DIAGNOSIS — Z7189 Other specified counseling: Secondary | ICD-10-CM

## 2018-12-29 DIAGNOSIS — J329 Chronic sinusitis, unspecified: Secondary | ICD-10-CM

## 2018-12-29 DIAGNOSIS — B9689 Other specified bacterial agents as the cause of diseases classified elsewhere: Secondary | ICD-10-CM

## 2018-12-29 MED ORDER — FLUCONAZOLE 150 MG PO TABS: 150.0000 mg | ORAL_TABLET | Freq: Once | ORAL | 0 refills | Status: AC

## 2018-12-29 MED ORDER — LEVOFLOXACIN 500 MG PO TABS: 500.0000 mg | ORAL_TABLET | Freq: Every day | ORAL | 0 refills | Status: DC

## 2018-12-29 NOTE — Progress Notes (Signed)
Virtual Visit via Video   I connected with patient on 12/29/18 at 10:00 AM EDT by a video enabled telemedicine application and verified that I am speaking with the correct person using two identifiers.  Location patient: Home Location provider: [facility], Office Persons participating in the virtual visit: Patient, Provider, CMA (Jess B)  I discussed the limitations of evaluation and management by telemedicine and the availability of in person appointments. The patient expressed understanding and agreed to proceed.  Subjective:   HPI:   URI- + facial pain, tooth pain, throbbing HA.  sxs started Saturday evening after the field behind her house was hay baled.  Sxs worsened Monday and Tuesday.  Developed fever yesterday- Tm 101.  Taking tylenol to reduce fever.  Denies cough, chest congestion, body aches.  Mild sore throat.  Feels very similar to previous sinus infections.  ROS:   See pertinent positives and negatives per HPI.  Patient Active Problem List   Diagnosis Date Noted  . Neuropathy of both feet 12/22/2017  . Physical exam 09/25/2016  . Chronic idiopathic constipation 10/13/2014  . Contact dermatitis due to jewelry 02/19/2014  . Acute sinusitis 11/19/2013  . Insomnia 06/22/2013  . Pain in right wrist 06/22/2013  . Soft tissue mass 06/22/2013  . Plantar fasciitis of left foot 03/14/2013  . Shaky 02/22/2013  . Depression with anxiety 10/28/2010  . Migraine without aura 10/28/2010  . GERD 10/28/2010  . CHEST PAIN, ATYPICAL, HX OF 03/27/2010  . ALLERGIC RHINITIS, SEASONAL 12/07/2009  . Fibromyalgia 10/26/2009  . POLYARTHRITIS 10/19/2009    Social History   Tobacco Use  . Smoking status: Never Smoker  . Smokeless tobacco: Never Used  Substance Use Topics  . Alcohol use: Yes    Comment: rarely    Current Outpatient Medications:  .  buPROPion (WELLBUTRIN XL) 300 MG 24 hr tablet, TAKE 1 TABLET BY MOUTH DAILY, Disp: 90 tablet, Rfl: 1 .  estradiol  (ESTRACE VAGINAL) 0.1 MG/GM vaginal cream, 2 grams intravaginally daily at bedtime for 2 weeks, then reduce to 1 gram intravaginally weekly at bedtime for maintenance., Disp: 42.5 g, Rfl: 12 .  gabapentin (NEURONTIN) 300 MG capsule, TAKE 1 CAPSULE BY MOUTH DAILY AT BEDTIME, Disp: 30 capsule, Rfl: 2 .  levocetirizine (XYZAL) 5 MG tablet, Take 5 mg by mouth every evening., Disp: , Rfl:  .  linaclotide (LINZESS) 72 MCG capsule, Take 1 capsule (72 mcg total) by mouth daily before breakfast., Disp: 90 capsule, Rfl: 0 .  magnesium 30 MG tablet, Take 30 mg by mouth every other day., Disp: , Rfl:  .  Multiple Vitamin (MULTIVITAMIN WITH MINERALS) TABS tablet, Take 1 tablet by mouth daily., Disp: , Rfl:  .  topiramate (TOPAMAX) 200 MG tablet, TAKE 1 TABLET BY MOUTH TWICE DAILY, Disp: 180 tablet, Rfl: 0 .  traZODone (DESYREL) 50 MG tablet, TAKE 1/2 TO 1 TABLET BY MOUTH AT BEDTIMEAS NEEDED FOR SLEEP, Disp: 30 tablet, Rfl: 2 .  vitamin E 1000 UNIT capsule, Take 1,000 Units by mouth daily., Disp: , Rfl:  .  benzonatate (TESSALON) 100 MG capsule, Take 1 capsule (100 mg total) by mouth 3 (three) times daily as needed for cough. (Patient not taking: Reported on 12/29/2018), Disp: 30 capsule, Rfl: 0 .  traMADol (ULTRAM) 50 MG tablet, Take 1 tablet (50 mg total) by mouth every 8 (eight) hours as needed for severe pain. (Patient not taking: Reported on 12/29/2018), Disp: 20 tablet, Rfl: 0  Allergies  Allergen Reactions  . Bee  Venom   . Azithromycin     REACTION: hives  . Diphenhydramine Hcl     REACTION: hives  . Erythromycin     REACTION: hives  . Latex     REACTION: rash  . Morphine And Related Nausea And Vomiting  . Penicillins     REACTION: rash, fever  . Percocet [Oxycodone-Acetaminophen] Other (See Comments)    Hallucinations   . Sulfonamide Derivatives     REACTION: rash, fever  . Tetracyclines & Related     Objective:   Temp 100.1 F (37.8 C) (Oral)   Ht 5\' 2"  (1.575 m)   Wt 126 lb (57.2 kg)    BMI 23.05 kg/m   AAOx3, NAD NCAT, EOMI No obvious CN deficits Coloring WNL Lying in bed Pt is able to speak clearly, coherently without shortness of breath or increased work of breathing.  Thought process is linear.  Mood is appropriate.   Assessment and Plan:   Bacterial sinusitis- this is a recurrent problem for pt and she reports that this feels similar to previous infxns.  Did discuss the possibility of COVID and warning signs to look for- chest congestion/tightness, SOB, cough, body aches.  Pt is aware and indicates she will let me know if sxs don't improve on the abx or worsen.   Neena Rhymes, MD 12/29/2018

## 2018-12-29 NOTE — Progress Notes (Signed)
I have discussed the procedure for the virtual visit with the patient who has given consent to proceed with assessment and treatment.   Jessica L Brodmerkel, CMA     

## 2019-01-27 ENCOUNTER — Other Ambulatory Visit: Payer: Self-pay | Admitting: *Deleted

## 2019-01-27 MED ORDER — GABAPENTIN 300 MG PO CAPS: ORAL_CAPSULE | ORAL | 1 refills | Status: DC

## 2019-01-27 NOTE — Telephone Encounter (Signed)
Patient's last RX was at old pharmacy and she needed new RX sent to her new pharmacy

## 2019-02-18 ENCOUNTER — Telehealth: Payer: Self-pay | Admitting: Family Medicine

## 2019-02-18 MED ORDER — TOPIRAMATE 200 MG PO TABS: 200.0000 mg | ORAL_TABLET | Freq: Two times a day (BID) | ORAL | 0 refills | Status: DC

## 2019-02-18 MED ORDER — BUPROPION HCL ER (XL) 300 MG PO TB24: 300.0000 mg | ORAL_TABLET | Freq: Every day | ORAL | 1 refills | Status: DC

## 2019-02-18 NOTE — Telephone Encounter (Signed)
Medication filled to pharmacy as requested.  Pt made aware.  

## 2019-02-18 NOTE — Telephone Encounter (Signed)
Pt called asking for a refill on the Topamax and Wellbutrin. [facility]. Pt can be reached at the home #

## 2019-03-31 ENCOUNTER — Other Ambulatory Visit: Payer: Self-pay | Admitting: Family Medicine

## 2019-04-01 NOTE — Telephone Encounter (Signed)
Last refill: 5.15.20 #30, 2  Last OV: 5.20.20 dx. Bacterial sinusitis, due for CPE

## 2019-04-07 MED ORDER — TRAZODONE HCL 50 MG PO TABS: 50.0000 mg | ORAL_TABLET | Freq: Every evening | ORAL | 0 refills | Status: DC | PRN

## 2019-04-07 NOTE — Addendum Note (Signed)
Addended by: Katina Dung on: 04/07/2019 09:46 AM   Modules accepted: Orders

## 2019-05-04 ENCOUNTER — Telehealth: Payer: Self-pay | Admitting: Family Medicine

## 2019-05-04 NOTE — Telephone Encounter (Signed)
Med was filled with refills.

## 2019-05-04 NOTE — Telephone Encounter (Signed)
Pt husband called in asking if they could get more than just a 30 day supply or add refills on the medication due the them having to call each mth asking for a refill

## 2019-05-10 ENCOUNTER — Encounter: Payer: Self-pay | Admitting: Physician Assistant

## 2019-05-10 ENCOUNTER — Other Ambulatory Visit: Payer: Self-pay

## 2019-05-10 ENCOUNTER — Ambulatory Visit (INDEPENDENT_AMBULATORY_CARE_PROVIDER_SITE_OTHER): Payer: Self-pay | Admitting: Physician Assistant

## 2019-05-10 VITALS — BP 123/77 | HR 90 | Temp 100.3°F

## 2019-05-10 DIAGNOSIS — J019 Acute sinusitis, unspecified: Secondary | ICD-10-CM

## 2019-05-10 DIAGNOSIS — B9689 Other specified bacterial agents as the cause of diseases classified elsewhere: Secondary | ICD-10-CM

## 2019-05-10 MED ORDER — BENZONATATE 100 MG PO CAPS: 100.0000 mg | ORAL_CAPSULE | Freq: Two times a day (BID) | ORAL | 0 refills | Status: DC | PRN

## 2019-05-10 MED ORDER — LEVOFLOXACIN 500 MG PO TABS: 500.0000 mg | ORAL_TABLET | Freq: Every day | ORAL | 0 refills | Status: DC

## 2019-05-10 NOTE — Progress Notes (Signed)
I have discussed the procedure for the virtual visit with the patient who has given consent to proceed with assessment and treatment.   Martha Moss S [name], CMA     

## 2019-05-10 NOTE — Patient Instructions (Signed)
Instructions sent to MyChart.    Please take antibiotic as directed.  Increase fluid intake.  Use Saline nasal spray.  Take a daily multivitamin. Continue Nasacort and Antihistamines. Can continue the Coricidin HBP. I have sent in Tessalon to also help with cough.  Place a humidifier in the bedroom.  Please call or return clinic if symptoms are not improving.  Sinusitis Sinusitis is redness, soreness, and swelling (inflammation) of the paranasal sinuses. Paranasal sinuses are air pockets within the bones of your face (beneath the eyes, the middle of the forehead, or above the eyes). In healthy paranasal sinuses, mucus is able to drain out, and air is able to circulate through them by way of your nose. However, when your paranasal sinuses are inflamed, mucus and air can become trapped. This can allow bacteria and other germs to grow and cause infection. Sinusitis can develop quickly and last only a short time (acute) or continue over a long period (chronic). Sinusitis that lasts for more than 12 weeks is considered chronic.  CAUSES  Causes of sinusitis include:  Allergies.  Structural abnormalities, such as displacement of the cartilage that separates your nostrils (deviated septum), which can decrease the air flow through your nose and sinuses and affect sinus drainage.  Functional abnormalities, such as when the small hairs (cilia) that line your sinuses and help remove mucus do not work properly or are not present. SYMPTOMS  Symptoms of acute and chronic sinusitis are the same. The primary symptoms are pain and pressure around the affected sinuses. Other symptoms include:  Upper toothache.  Earache.  Headache.  Bad breath.  Decreased sense of smell and taste.  A cough, which worsens when you are lying flat.  Fatigue.  Fever.  Thick drainage from your nose, which often is green and may contain pus (purulent).  Swelling and warmth over the affected sinuses. DIAGNOSIS  Your  caregiver will perform a physical exam. During the exam, your caregiver may:  Look in your nose for signs of abnormal growths in your nostrils (nasal polyps).  Tap over the affected sinus to check for signs of infection.  View the inside of your sinuses (endoscopy) with a special imaging device with a light attached (endoscope), which is inserted into your sinuses. If your caregiver suspects that you have chronic sinusitis, one or more of the following tests may be recommended:  Allergy tests.  Nasal culture A sample of mucus is taken from your nose and sent to a lab and screened for bacteria.  Nasal cytology A sample of mucus is taken from your nose and examined by your caregiver to determine if your sinusitis is related to an allergy. TREATMENT  Most cases of acute sinusitis are related to a viral infection and will resolve on their own within 10 days. Sometimes medicines are prescribed to help relieve symptoms (pain medicine, decongestants, nasal steroid sprays, or saline sprays).  However, for sinusitis related to a bacterial infection, your caregiver will prescribe antibiotic medicines. These are medicines that will help kill the bacteria causing the infection.  Rarely, sinusitis is caused by a fungal infection. In theses cases, your caregiver will prescribe antifungal medicine. For some cases of chronic sinusitis, surgery is needed. Generally, these are cases in which sinusitis recurs more than 3 times per year, despite other treatments. HOME CARE INSTRUCTIONS   Drink plenty of water. Water helps thin the mucus so your sinuses can drain more easily.  Use a humidifier.  Inhale steam 3 to 4 times a  day (for example, sit in the bathroom with the shower running).  Apply a warm, moist washcloth to your face 3 to 4 times a day, or as directed by your caregiver.  Use saline nasal sprays to help moisten and clean your sinuses.  Take over-the-counter or prescription medicines for pain,  discomfort, or fever only as directed by your caregiver. SEEK IMMEDIATE MEDICAL CARE IF:  You have increasing pain or severe headaches.  You have nausea, vomiting, or drowsiness.  You have swelling around your face.  You have vision problems.  You have a stiff neck.  You have difficulty breathing. MAKE SURE YOU:   Understand these instructions.  Will watch your condition.  Will get help right away if you are not doing well or get worse. Document Released: 07/28/2005 Document Revised: 10/20/2011 Document Reviewed: 08/12/2011 [facility] Patient Information 2014 [place], [place].

## 2019-05-10 NOTE — Progress Notes (Signed)
Virtual Visit via Video   I connected with patient on 05/10/19 at  9:00 AM EDT by a video enabled telemedicine application and verified that I am speaking with the correct person using two identifiers.  Location patient: Home Location provider: [facility], Office Persons participating in the virtual visit: Patient, Provider, [place] (Patina Moore)  I discussed the limitations of evaluation and management by telemedicine and the availability of in person appointments. The patient expressed understanding and agreed to proceed.  Subjective:   HPI:   Patient presents via [facility] today c/o 4 days of nasal congestion, cough that is productive of yellow-green, headache, fatigue. Noted chest wall tenderness with coughing.  Endorses now having maxillary facial pain and fever starting Saturday.  Notes Tmax 102 this past weekend. Denies recent travel or sick contact. Denies SOB, chest pressure. Has taken Coricidin HBP. Was tested this weekend for COVID and got  Results back yesterday -- negative.   ROS:   See pertinent positives and negatives per HPI.  Patient Active Problem List   Diagnosis Date Noted  . Neuropathy of both feet 12/22/2017  . Physical exam 09/25/2016  . Chronic idiopathic constipation 10/13/2014  . Contact dermatitis due to jewelry 02/19/2014  . Acute sinusitis 11/19/2013  . Insomnia 06/22/2013  . Pain in right wrist 06/22/2013  . Soft tissue mass 06/22/2013  . Plantar fasciitis of left foot 03/14/2013  . Shaky 02/22/2013  . Depression with anxiety 10/28/2010  . Migraine without aura 10/28/2010  . GERD 10/28/2010  . CHEST PAIN, ATYPICAL, HX OF 03/27/2010  . ALLERGIC RHINITIS, SEASONAL 12/07/2009  . Fibromyalgia 10/26/2009  . POLYARTHRITIS 10/19/2009    Social History   Tobacco Use  . Smoking status: Never Smoker  . Smokeless tobacco: Never Used  Substance Use Topics  . Alcohol use: Yes    Comment: rarely    Current Outpatient Medications:  .   buPROPion (WELLBUTRIN XL) 300 MG 24 hr tablet, Take 1 tablet (300 mg total) by mouth daily., Disp: 90 tablet, Rfl: 1 .  estradiol (ESTRACE VAGINAL) 0.1 MG/GM vaginal cream, 2 grams intravaginally daily at bedtime for 2 weeks, then reduce to 1 gram intravaginally weekly at bedtime for maintenance., Disp: 42.5 g, Rfl: 12 .  fexofenadine (ALLEGRA) 180 MG tablet, Take 180 mg by mouth daily., Disp: , Rfl:  .  gabapentin (NEURONTIN) 300 MG capsule, TAKE 1 CAPSULE BY MOUTH DAILY AT BEDTIME, Disp: 30 capsule, Rfl: 1 .  levocetirizine (XYZAL) 5 MG tablet, Take 5 mg by mouth every evening., Disp: , Rfl:  .  linaclotide (LINZESS) 72 MCG capsule, Take 1 capsule (72 mcg total) by mouth daily before breakfast., Disp: 90 capsule, Rfl: 0 .  magnesium 30 MG tablet, Take 30 mg by mouth every other day., Disp: , Rfl:  .  Multiple Vitamin (MULTIVITAMIN WITH MINERALS) TABS tablet, Take 1 tablet by mouth daily., Disp: , Rfl:  .  topiramate (TOPAMAX) 200 MG tablet, Take 1 tablet (200 mg total) by mouth 2 (two) times daily., Disp: 180 tablet, Rfl: 0 .  traZODone (DESYREL) 50 MG tablet, TAKE 1 TABLET BY MOUTH DAILY AT BEDTIME FOR SLEEP, Disp: 30 tablet, Rfl: 6 .  vitamin E 1000 UNIT capsule, Take 1,000 Units by mouth daily., Disp: , Rfl:   Allergies  Allergen Reactions  . Bee Venom   . Azithromycin     REACTION: hives  . Diphenhydramine Hcl     REACTION: hives  . Erythromycin     REACTION: hives  .  Latex     REACTION: rash  . Morphine And Related Nausea And Vomiting  . Penicillins     REACTION: rash, fever  . Percocet [Oxycodone-Acetaminophen] Other (See Comments)    Hallucinations   . Sulfonamide Derivatives     REACTION: rash, fever  . Tetracyclines & Related     Objective:   BP 123/77   Pulse 90   Temp 100.3 F (37.9 C) (Temporal)   Patient is well-developed, well-nourished in no acute distress.  Resting comfortably at home.  Head is normocephalic, atraumatic.  No labored breathing.  Speech is  clear and coherent with logical content.  Patient is alert and oriented at baseline.  + TTP of sinuses on examination (patient-performed)  Assessment and Plan:   1. Acute bacterial sinusitis Patient with multi-drug allergies. As such Rx. Levaquin to cover sinusitis and in case of any developing CAP. Her COVID test was negative but I feel based on symptoms this is questionable. She is still to quarantine until symptoms resolved. Rx Tessalon for cough. Can take with Coricidin. Alternate Ibuprofen and Tylenol. Restart Nasacort. Continue OTC antihistamine. Strict ER precautions reviewed.  - levofloxacin (LEVAQUIN) 500 MG tablet; Take 1 tablet (500 mg total) by mouth daily.  Dispense: 7 tablet; Refill: 0 - benzonatate (TESSALON) 100 MG capsule; Take 1 capsule (100 mg total) by mouth 2 (two) times daily as needed for cough.  Dispense: 20 capsule; Refill: 0 .   Piedad Climes, PA-C 05/10/2019

## 2019-05-19 ENCOUNTER — Telehealth: Payer: Self-pay

## 2019-05-19 NOTE — Telephone Encounter (Signed)
Since pt has a productive cough, she is not to come to the office.  If she feels she would rather go to UC to be evaluated that is certainly her decision but we have been able to do a very good job via video visits

## 2019-05-19 NOTE — Telephone Encounter (Signed)
Called patient to go over screening questions for office visit tomorrow. Stated that she is still experiencing a productive cough. Informed patient that due to our current COVID in office restrictions, her visit would have to be thru Doxy[facility]Me. Proceeded to inform me that she is having ear pain that has not resolved since her visit with [facility]. I told patient that we could start with a VV and then if Dr. Birdie Riddle felt it was safe for her to come into the office at a later time, we could schedule her for an in office appt. Patient told me that she does not have insurance and cannot pay for both a VV and then in office visit if needed. Please advise

## 2019-05-20 ENCOUNTER — Ambulatory Visit: Payer: Self-pay | Admitting: Family Medicine

## 2019-05-20 NOTE — Telephone Encounter (Signed)
Called patient, explained again that due to our current COVID restrictions for in office visits, she would have to do a virtual visit. Informed patient that if she preferred a "hands on, in office visit" she would have to be seen at [facility]. Patient stated that she would "just go to UC since her doctor didn't want to see her in office, and she needs her ear looked at." Confirmed with patient that she wanted her appt cancelled and she said yes. Appt has been cancelled.

## 2019-06-20 ENCOUNTER — Other Ambulatory Visit: Payer: Self-pay | Admitting: Oncology

## 2019-06-22 ENCOUNTER — Telehealth: Payer: Self-pay | Admitting: Licensed Clinical Social Worker

## 2019-06-22 NOTE — Telephone Encounter (Signed)
Scheduled genetic counseling appointment for Martha Moss at 11 am 11/13. This will be a virtual visit with [facility].

## 2019-06-24 ENCOUNTER — Inpatient Hospital Stay: Payer: Self-pay | Attending: Genetic Counselor | Admitting: Licensed Clinical Social Worker

## 2019-07-14 ENCOUNTER — Encounter: Payer: Self-pay | Admitting: Licensed Clinical Social Worker

## 2019-08-18 ENCOUNTER — Other Ambulatory Visit: Payer: Self-pay | Admitting: Family Medicine

## 2019-09-23 ENCOUNTER — Ambulatory Visit (INDEPENDENT_AMBULATORY_CARE_PROVIDER_SITE_OTHER): Payer: Self-pay | Admitting: Family Medicine

## 2019-09-23 ENCOUNTER — Encounter: Payer: Self-pay | Admitting: Family Medicine

## 2019-09-23 ENCOUNTER — Other Ambulatory Visit: Payer: Self-pay

## 2019-09-23 VITALS — Temp 98.6°F

## 2019-09-23 DIAGNOSIS — J329 Chronic sinusitis, unspecified: Secondary | ICD-10-CM

## 2019-09-23 DIAGNOSIS — B9689 Other specified bacterial agents as the cause of diseases classified elsewhere: Secondary | ICD-10-CM

## 2019-09-23 MED ORDER — LEVOFLOXACIN 500 MG PO TABS: 500.0000 mg | ORAL_TABLET | Freq: Every day | ORAL | 0 refills | Status: DC

## 2019-09-23 NOTE — Progress Notes (Signed)
I have discussed the procedure for the virtual visit with the patient who has given consent to proceed with assessment and treatment.   Pt unable to obtain vitals.   Jessica L Brodmerkel, CMA     

## 2019-09-23 NOTE — Progress Notes (Signed)
Virtual Visit via Video   I connected with patient on 09/23/19 at  2:00 PM EST by a video enabled telemedicine application and verified that I am speaking with the correct person using two identifiers.  Location patient: Home Location provider: [facility], Office Persons participating in the virtual visit: Patient, Provider, CMA (Jess B)  I discussed the limitations of evaluation and management by telemedicine and the availability of in person appointments. The patient expressed understanding and agreed to proceed.  Subjective:   HPI:   Sinusitis- 'i've got pressure in my face and head.  i've got a headache'.  + dry cough.  Nasal congestion is green.  No fevers.  No changes to taste or smell.  No body aches, fatigue.  No known sick contacts.  No known COVID exposures.  + tooth pain.  Pt reports this is similar to previous sinus infxns.    ROS:   See pertinent positives and negatives per HPI.  Patient Active Problem List   Diagnosis Date Noted  . Neuropathy of both feet 12/22/2017  . Physical exam 09/25/2016  . Chronic idiopathic constipation 10/13/2014  . Contact dermatitis due to jewelry 02/19/2014  . Acute sinusitis 11/19/2013  . Insomnia 06/22/2013  . Pain in right wrist 06/22/2013  . Soft tissue mass 06/22/2013  . Plantar fasciitis of left foot 03/14/2013  . Shaky 02/22/2013  . Depression with anxiety 10/28/2010  . Migraine without aura 10/28/2010  . GERD 10/28/2010  . CHEST PAIN, ATYPICAL, HX OF 03/27/2010  . ALLERGIC RHINITIS, SEASONAL 12/07/2009  . Fibromyalgia 10/26/2009  . POLYARTHRITIS 10/19/2009    Social History   Tobacco Use  . Smoking status: Never Smoker  . Smokeless tobacco: Never Used  Substance Use Topics  . Alcohol use: Yes    Comment: rarely    Current Outpatient Medications:  .  buPROPion (WELLBUTRIN XL) 300 MG 24 hr tablet, Take 1 tablet (300 mg total) by mouth daily., Disp: 90 tablet, Rfl: 1 .  estradiol (ESTRACE VAGINAL) 0.1  MG/GM vaginal cream, 2 grams intravaginally daily at bedtime for 2 weeks, then reduce to 1 gram intravaginally weekly at bedtime for maintenance., Disp: 42.5 g, Rfl: 12 .  fexofenadine (ALLEGRA) 180 MG tablet, Take 180 mg by mouth daily., Disp: , Rfl:  .  gabapentin (NEURONTIN) 300 MG capsule, TAKE 1 CAPSULE BY MOUTH DAILY AT BEDTIME, Disp: 30 capsule, Rfl: 1 .  levocetirizine (XYZAL) 5 MG tablet, Take 5 mg by mouth every evening., Disp: , Rfl:  .  linaclotide (LINZESS) 72 MCG capsule, Take 1 capsule (72 mcg total) by mouth daily before breakfast., Disp: 90 capsule, Rfl: 0 .  magnesium 30 MG tablet, Take 30 mg by mouth every other day., Disp: , Rfl:  .  Multiple Vitamin (MULTIVITAMIN WITH MINERALS) TABS tablet, Take 1 tablet by mouth daily., Disp: , Rfl:  .  topiramate (TOPAMAX) 200 MG tablet, TAKE 1 TABLET BY MOUTH TWICE DAILY, Disp: 180 tablet, Rfl: 0 .  traZODone (DESYREL) 50 MG tablet, TAKE 1 TABLET BY MOUTH DAILY AT BEDTIME FOR SLEEP, Disp: 30 tablet, Rfl: 6 .  vitamin E 1000 UNIT capsule, Take 1,000 Units by mouth daily., Disp: , Rfl:   Allergies  Allergen Reactions  . Bee Venom   . Azithromycin     REACTION: hives  . Diphenhydramine Hcl     REACTION: hives  . Erythromycin     REACTION: hives  . Latex     REACTION: rash  . Morphine And Related Nausea  And Vomiting  . Penicillins     REACTION: rash, fever  . Percocet [Oxycodone-Acetaminophen] Other (See Comments)    Hallucinations   . Sulfonamide Derivatives     REACTION: rash, fever  . Tetracyclines & Related     Objective:   Temp 98.6 F (37 C) (Oral)  AAOx3, NAD NCAT, EOMI No obvious CN deficits Coloring WNL Pt is able to speak clearly, coherently without shortness of breath or increased work of breathing.  Thought process is linear.  Mood is appropriate.   Assessment and Plan:   Sinusitis- pt reports this feels very similar to her previous recurrent sinus infxns.  Given her multiple allergies, we are limited to  Fluoroquinolones like Levaquin.  Discussed that if sxs fail to improve or worsen she must get tested for COVID.  Pt expressed understanding and is in agreement w/ plan.    Annye Asa, MD 09/23/2019

## 2019-10-04 ENCOUNTER — Other Ambulatory Visit: Payer: Self-pay | Admitting: Family Medicine

## 2019-10-19 ENCOUNTER — Telehealth: Payer: Self-pay | Admitting: Family Medicine

## 2019-10-19 NOTE — Telephone Encounter (Signed)
Called and pt of PCP recommendation. She stated that she had tried tums and gaviscon with no relief. I advised that all I could do was route message back to PCP to advise because at this time she wanted to wait until appt tomorrow, Pt said to never mind she understood and hung up.

## 2019-10-19 NOTE — Telephone Encounter (Signed)
Until appt tomorrow, pt can get OTC acid reducers and add tums as needed

## 2019-10-19 NOTE — Telephone Encounter (Signed)
Pt called in stating that she has been dealing with very bad indigestion, she states that it does get worse at night. The back of her throat hurts and she now has a cough. She wanted to know what she could do about this. I did schedule her an appt tomorrow with Beverely Low but she wanted to know what she can do at night to help relieve the pain

## 2019-10-19 NOTE — Telephone Encounter (Signed)
Please advise 

## 2019-10-20 ENCOUNTER — Other Ambulatory Visit: Payer: Self-pay

## 2019-10-20 ENCOUNTER — Ambulatory Visit (INDEPENDENT_AMBULATORY_CARE_PROVIDER_SITE_OTHER): Payer: Self-pay | Admitting: Family Medicine

## 2019-10-20 ENCOUNTER — Encounter: Payer: Self-pay | Admitting: Family Medicine

## 2019-10-20 DIAGNOSIS — K2900 Acute gastritis without bleeding: Secondary | ICD-10-CM

## 2019-10-20 DIAGNOSIS — K219 Gastro-esophageal reflux disease without esophagitis: Secondary | ICD-10-CM

## 2019-10-20 MED ORDER — PANTOPRAZOLE SODIUM 40 MG PO TBEC: 40.0000 mg | DELAYED_RELEASE_TABLET | Freq: Every day | ORAL | 3 refills | Status: DC

## 2019-10-20 MED ORDER — SUCRALFATE 1 G PO TABS: 1.0000 g | ORAL_TABLET | Freq: Three times a day (TID) | ORAL | 0 refills | Status: DC

## 2019-10-20 MED ORDER — ONDANSETRON HCL 4 MG PO TABS: 4.0000 mg | ORAL_TABLET | Freq: Three times a day (TID) | ORAL | 0 refills | Status: DC | PRN

## 2019-10-20 NOTE — Progress Notes (Signed)
Virtual Visit via Video   I connected with patient on 10/20/19 at 11:00 AM EST by a video enabled telemedicine application and verified that I am speaking with the correct person using two identifiers.  Location patient: Home Location provider: [facility], Office Persons participating in the virtual visit: Patient, Provider, CMA (Jess B)  I discussed the limitations of evaluation and management by telemedicine and the availability of in person appointments. The patient expressed understanding and agreed to proceed.  Subjective:   HPI:   GERD- pt has a hx of this but has not been on any regular medication recently.  Pt is complaining of epigastric pain, radiates up into throat.  + nausea.  sxs 'are a lot worse after I eat'.  + increased gas and belching, + sour brash.  Having 'severe burning' at night and sxs will wake her.  Has developed dry cough.  Taking OTC Nexium BID and adding Gaviscon w/o relief.    ROS:   See pertinent positives and negatives per HPI.  Patient Active Problem List   Diagnosis Date Noted  . Neuropathy of both feet 12/22/2017  . Physical exam 09/25/2016  . Chronic idiopathic constipation 10/13/2014  . Contact dermatitis due to jewelry 02/19/2014  . Acute sinusitis 11/19/2013  . Insomnia 06/22/2013  . Pain in right wrist 06/22/2013  . Soft tissue mass 06/22/2013  . Plantar fasciitis of left foot 03/14/2013  . Shaky 02/22/2013  . Depression with anxiety 10/28/2010  . Migraine without aura 10/28/2010  . GERD 10/28/2010  . CHEST PAIN, ATYPICAL, HX OF 03/27/2010  . ALLERGIC RHINITIS, SEASONAL 12/07/2009  . Fibromyalgia 10/26/2009  . POLYARTHRITIS 10/19/2009    Social History   Tobacco Use  . Smoking status: Never Smoker  . Smokeless tobacco: Never Used  Substance Use Topics  . Alcohol use: Yes    Comment: rarely    Current Outpatient Medications:  .  buPROPion (WELLBUTRIN XL) 300 MG 24 hr tablet, TAKE 1 TABLET BY MOUTH DAILY, Disp: 90  tablet, Rfl: 1 .  estradiol (ESTRACE VAGINAL) 0.1 MG/GM vaginal cream, 2 grams intravaginally daily at bedtime for 2 weeks, then reduce to 1 gram intravaginally weekly at bedtime for maintenance., Disp: 42.5 g, Rfl: 12 .  fexofenadine (ALLEGRA) 180 MG tablet, Take 180 mg by mouth daily., Disp: , Rfl:  .  gabapentin (NEURONTIN) 300 MG capsule, TAKE 1 CAPSULE BY MOUTH DAILY AT BEDTIME, Disp: 30 capsule, Rfl: 1 .  levocetirizine (XYZAL) 5 MG tablet, Take 5 mg by mouth every evening., Disp: , Rfl:  .  linaclotide (LINZESS) 72 MCG capsule, Take 1 capsule (72 mcg total) by mouth daily before breakfast., Disp: 90 capsule, Rfl: 0 .  magnesium 30 MG tablet, Take 30 mg by mouth every other day., Disp: , Rfl:  .  Multiple Vitamin (MULTIVITAMIN WITH MINERALS) TABS tablet, Take 1 tablet by mouth daily., Disp: , Rfl:  .  topiramate (TOPAMAX) 200 MG tablet, TAKE 1 TABLET BY MOUTH TWICE DAILY, Disp: 180 tablet, Rfl: 0 .  traZODone (DESYREL) 50 MG tablet, TAKE 1 TABLET BY MOUTH DAILY AT BEDTIME FOR SLEEP, Disp: 30 tablet, Rfl: 6 .  vitamin E 1000 UNIT capsule, Take 1,000 Units by mouth daily., Disp: , Rfl:   Allergies  Allergen Reactions  . Bee Venom   . Azithromycin     REACTION: hives  . Diphenhydramine Hcl     REACTION: hives  . Erythromycin     REACTION: hives  . Latex  REACTION: rash  . Morphine And Related Nausea And Vomiting  . Penicillins     REACTION: rash, fever  . Percocet [Oxycodone-Acetaminophen] Other (See Comments)    Hallucinations   . Sulfonamide Derivatives     REACTION: rash, fever  . Tetracyclines & Related     Objective:   There were no vitals taken for this visit.  AAOx3, NAD NCAT, EOMI No obvious CN deficits Coloring WNL Pt is able to speak clearly, coherently without shortness of breath or increased work of breathing.  Thought process is linear.  Mood is appropriate.   Assessment and Plan:   GERD/gastritis- new to provider but pt has hx of similar years ago.   Sxs are severe per pt's report.  No vomiting, but + nausea.  Will switch to prescription strength PPI.  Add Carafate prior to eating and Zofran prn.  If no improvement, will need GI referral.  Reviewed supportive care and red flags that should prompt return.  Pt expressed understanding and is in agreement w/ plan.   Annye Asa, MD 10/20/2019

## 2019-10-20 NOTE — Progress Notes (Signed)
I have discussed the procedure for the virtual visit with the patient who has given consent to proceed with assessment and treatment.   Pt unable to obtain vitals.   Joyann Spidle L Braxdon Gappa, CMA     

## 2019-11-04 ENCOUNTER — Ambulatory Visit: Payer: Self-pay | Attending: Internal Medicine

## 2019-11-04 DIAGNOSIS — Z23 Encounter for immunization: Secondary | ICD-10-CM

## 2019-11-04 NOTE — Progress Notes (Signed)
   Covid-19 Vaccination Clinic  Name:  Martha Moss    MRN: [id number] DOB: 1965-11-12  11/04/2019  [name] was observed post Covid-19 immunization for 15 minutes without incident. She was provided with Vaccine Information Sheet and instruction to access the V-Safe system.   [name] was instructed to call 911 with any severe reactions post vaccine: Marland Kitchen Difficulty breathing  . Swelling of face and throat  . A fast heartbeat  . A bad rash all over body  . Dizziness and weakness   Immunizations Administered    Name Date Dose VIS Date Route   Pfizer COVID-19 Vaccine 11/04/2019 11:12 AM 0.3 mL 07/22/2019 Intramuscular   Manufacturer: [employer], [employer]   Lot: [id number]   NDC: [address]-6

## 2019-11-28 ENCOUNTER — Ambulatory Visit: Payer: Self-pay | Attending: Internal Medicine

## 2019-11-28 DIAGNOSIS — Z23 Encounter for immunization: Secondary | ICD-10-CM

## 2019-11-28 NOTE — Progress Notes (Signed)
   Covid-19 Vaccination Clinic  Name:  Martha Moss    MRN: [id number] DOB: 05/25/1966  11/28/2019  Martha Moss was observed post Covid-19 immunization for 30 minutes based on pre-vaccination screening without incident. She was provided with Vaccine Information Sheet and instruction to access the V-Safe system.   Martha Moss was instructed to call 911 with any severe reactions post vaccine: Marland Kitchen Difficulty breathing  . Swelling of face and throat  . A fast heartbeat  . A bad rash all over body  . Dizziness and weakness   Immunizations Administered    Name Date Dose VIS Date Route   Pfizer COVID-19 Vaccine 11/28/2019  3:33 PM 0.3 mL 10/05/2018 Intramuscular   Manufacturer: [employer], [employer]   Lot: [id number]   NDC: [address]-3

## 2019-11-30 ENCOUNTER — Other Ambulatory Visit: Payer: Self-pay

## 2019-11-30 ENCOUNTER — Encounter: Payer: Self-pay | Admitting: Physician Assistant

## 2019-11-30 ENCOUNTER — Telehealth (INDEPENDENT_AMBULATORY_CARE_PROVIDER_SITE_OTHER): Payer: Self-pay | Admitting: Physician Assistant

## 2019-11-30 DIAGNOSIS — B9689 Other specified bacterial agents as the cause of diseases classified elsewhere: Secondary | ICD-10-CM

## 2019-11-30 DIAGNOSIS — J019 Acute sinusitis, unspecified: Secondary | ICD-10-CM

## 2019-11-30 MED ORDER — LEVOFLOXACIN 500 MG PO TABS: 500.0000 mg | ORAL_TABLET | Freq: Every day | ORAL | 0 refills | Status: DC

## 2019-11-30 MED ORDER — TRIAMCINOLONE ACETONIDE 55 MCG/ACT NA AERO: 2.0000 | INHALATION_SPRAY | Freq: Every day | NASAL | 0 refills | Status: DC

## 2019-11-30 NOTE — Progress Notes (Signed)
I have discussed the procedure for the virtual visit with the patient who has given consent to proceed with assessment and treatment.   Tamarick Kovalcik S Lasharon Dunivan, CMA     

## 2019-11-30 NOTE — Progress Notes (Signed)
Virtual Visit via Video   I connected with patient on 11/30/19 at 11:00 AM EDT by a video enabled telemedicine application and verified that I am speaking with the correct person using two identifiers.  Location patient: Home Location provider: [facility], Office Persons participating in the virtual visit: Patient, Provider, [place] (Patina Moore)  I discussed the limitations of evaluation and management by telemedicine and the availability of in person appointments. The patient expressed understanding and agreed to proceed.  Subjective:   HPI:   Patient presents via [facility] today complaining of several days of sinus pressure, ear pressure, nasal congestion, PND and sinus pain.  States symptoms acutely worsened this past Sunday, now with facial and tooth pain.  Denies fever chills.  Denies chest congestion, shortness of breath or chest pain.  Denies loss of taste or smell.  Denies GI symptoms.  Denies recent travel or sick contact.  Has been taking Mucinex and Allegra OTC with only some relief of symptoms.  Has significant history of sinusitis.  Notes this feels identical to previous episodes..  ROS:   See pertinent positives and negatives per HPI.  Patient Active Problem List   Diagnosis Date Noted  . Neuropathy of both feet 12/22/2017  . Physical exam 09/25/2016  . Chronic idiopathic constipation 10/13/2014  . Contact dermatitis due to jewelry 02/19/2014  . Acute sinusitis 11/19/2013  . Insomnia 06/22/2013  . Pain in right wrist 06/22/2013  . Soft tissue mass 06/22/2013  . Plantar fasciitis of left foot 03/14/2013  . Shaky 02/22/2013  . Depression with anxiety 10/28/2010  . Migraine without aura 10/28/2010  . GERD 10/28/2010  . CHEST PAIN, ATYPICAL, HX OF 03/27/2010  . ALLERGIC RHINITIS, SEASONAL 12/07/2009  . Fibromyalgia 10/26/2009  . POLYARTHRITIS 10/19/2009    Social History   Tobacco Use  . Smoking status: Never Smoker  . Smokeless tobacco: Never Used    Substance Use Topics  . Alcohol use: Yes    Comment: rarely    Current Outpatient Medications:  .  buPROPion (WELLBUTRIN XL) 300 MG 24 hr tablet, TAKE 1 TABLET BY MOUTH DAILY, Disp: 90 tablet, Rfl: 1 .  estradiol (ESTRACE VAGINAL) 0.1 MG/GM vaginal cream, 2 grams intravaginally daily at bedtime for 2 weeks, then reduce to 1 gram intravaginally weekly at bedtime for maintenance., Disp: 42.5 g, Rfl: 12 .  fexofenadine (ALLEGRA) 180 MG tablet, Take 180 mg by mouth daily., Disp: , Rfl:  .  gabapentin (NEURONTIN) 300 MG capsule, TAKE 1 CAPSULE BY MOUTH DAILY AT BEDTIME, Disp: 30 capsule, Rfl: 1 .  levocetirizine (XYZAL) 5 MG tablet, Take 5 mg by mouth every evening., Disp: , Rfl:  .  linaclotide (LINZESS) 72 MCG capsule, Take 1 capsule (72 mcg total) by mouth daily before breakfast., Disp: 90 capsule, Rfl: 0 .  magnesium 30 MG tablet, Take 30 mg by mouth every other day., Disp: , Rfl:  .  Multiple Vitamin (MULTIVITAMIN WITH MINERALS) TABS tablet, Take 1 tablet by mouth daily., Disp: , Rfl:  .  ondansetron (ZOFRAN) 4 MG tablet, Take 1 tablet (4 mg total) by mouth every 8 (eight) hours as needed for nausea or vomiting., Disp: 20 tablet, Rfl: 0 .  pantoprazole (PROTONIX) 40 MG tablet, Take 1 tablet (40 mg total) by mouth daily., Disp: 30 tablet, Rfl: 3 .  sucralfate (CARAFATE) 1 g tablet, Take 1 tablet (1 g total) by mouth 4 (four) times daily -  with meals and at bedtime., Disp: 90 tablet, Rfl: 0 .  topiramate (TOPAMAX) 200 MG tablet, TAKE 1 TABLET BY MOUTH TWICE DAILY, Disp: 180 tablet, Rfl: 0 .  traZODone (DESYREL) 50 MG tablet, TAKE 1 TABLET BY MOUTH DAILY AT BEDTIME FOR SLEEP, Disp: 30 tablet, Rfl: 6 .  vitamin E 1000 UNIT capsule, Take 1,000 Units by mouth daily., Disp: , Rfl:   Allergies  Allergen Reactions  . Bee Venom   . Azithromycin     REACTION: hives  . Diphenhydramine Hcl     REACTION: hives  . Erythromycin     REACTION: hives  . Latex     REACTION: rash  . Morphine And  Related Nausea And Vomiting  . Penicillins     REACTION: rash, fever  . Percocet [Oxycodone-Acetaminophen] Other (See Comments)    Hallucinations   . Sulfonamide Derivatives     REACTION: rash, fever  . Tetracyclines & Related     Objective:   There were no vitals taken for this visit.  Patient is well-developed, well-nourished in no acute distress.  Resting comfortably at home.  Head is normocephalic, atraumatic.  No labored breathing.  Speech is clear and coherent with logical content.  Patient is alert and oriented at baseline.  + TTP sinuses on examination.  Assessment and Plan:   1. Acute bacterial sinusitis Rx levaquin -- patient allergic to other options.  Increase fluids.  Rest.  Saline nasal spray.  Probiotic.  Mucinex as directed.  Humidifier in bedroom. Nasacort per orders. Continue daily antihistamine.  Call or return to clinic if symptoms are not improving.     Piedad Climes, [place] 11/30/2019

## 2019-12-06 ENCOUNTER — Telehealth: Payer: Self-pay | Admitting: Family Medicine

## 2019-12-06 NOTE — Telephone Encounter (Signed)
Pt's husband called in stating that pt saw Selena Batten last week for a sinus infection. He states that it normally takes 2 rounds of antibiotics to get rid of the infection completely, he wanted to know if Selena Batten would call in another antibiotic for her. Pt uses [facility] and can be reached at the home #

## 2019-12-07 ENCOUNTER — Other Ambulatory Visit: Payer: Self-pay

## 2019-12-07 MED ORDER — LEVOFLOXACIN 500 MG PO TABS: 500.0000 mg | ORAL_TABLET | Freq: Every day | ORAL | 0 refills | Status: AC

## 2019-12-07 NOTE — Telephone Encounter (Signed)
Ok to extend Levaquin for 3 more days to complete a total of a 10-day course for her sinusitis. Typically do not give more than 10-days of this due to risk of serious side effects with prolonged use.

## 2019-12-07 NOTE — Telephone Encounter (Signed)
Patient called back and was informed an additional 3 days of antibiotics was sent to her pharmacy. Patient voiced understanding. Medication sent to pharmacy.

## 2019-12-07 NOTE — Telephone Encounter (Signed)
Left patient a vm message for her to call the office back.

## 2019-12-08 ENCOUNTER — Other Ambulatory Visit: Payer: Self-pay | Admitting: Family Medicine

## 2020-01-16 ENCOUNTER — Other Ambulatory Visit: Payer: Self-pay

## 2020-01-16 ENCOUNTER — Ambulatory Visit (INDEPENDENT_AMBULATORY_CARE_PROVIDER_SITE_OTHER): Payer: Self-pay

## 2020-01-16 ENCOUNTER — Ambulatory Visit (HOSPITAL_COMMUNITY)
Admission: EM | Admit: 2020-01-16 | Discharge: 2020-01-16 | Disposition: A | Discharge status: Home / Self Care | Payer: Self-pay | Attending: Family Medicine | Admitting: Family Medicine

## 2020-01-16 DIAGNOSIS — M7989 Other specified soft tissue disorders: Secondary | ICD-10-CM

## 2020-01-16 DIAGNOSIS — M79672 Pain in left foot: Secondary | ICD-10-CM

## 2020-01-16 NOTE — ED Provider Notes (Signed)
[facility]    CSN: [id number] Arrival date & time: 01/16/20  1731      History   Chief Complaint Chief Complaint  Patient presents with  . Foot Pain    HPI MICKALA LATON is a 54 y.o. female.   She is presenting with pain of the fourth and fifth digit on the left foot.  She had a trip and fall and hit her toes on the door frame.  She is having some ecchymosis and swelling of these digits.  Denies any history of similar pain.  Does not take anything for the pain.  HPI  Past Medical History:  Diagnosis Date  . Depression   . Fibromyalgia   . GERD (gastroesophageal reflux disease)   . Migraine   . PONV (postoperative nausea and vomiting)     Patient Active Problem List   Diagnosis Date Noted  . Neuropathy of both feet 12/22/2017  . Physical exam 09/25/2016  . Chronic idiopathic constipation 10/13/2014  . Contact dermatitis due to jewelry 02/19/2014  . Acute sinusitis 11/19/2013  . Insomnia 06/22/2013  . Pain in right wrist 06/22/2013  . Soft tissue mass 06/22/2013  . Plantar fasciitis of left foot 03/14/2013  . Shaky 02/22/2013  . Depression with anxiety 10/28/2010  . Migraine without aura 10/28/2010  . GERD 10/28/2010  . CHEST PAIN, ATYPICAL, HX OF 03/27/2010  . ALLERGIC RHINITIS, SEASONAL 12/07/2009  . Fibromyalgia 10/26/2009  . POLYARTHRITIS 10/19/2009    Past Surgical History:  Procedure Laterality Date  . CHOLECYSTECTOMY    . ETHMOIDECTOMY Bilateral 09/09/2016   Procedure: ETHMOIDECTOMY;  Surgeon: Newman Pies, MD;  Location: [facility];  Service: ENT;  Laterality: Bilateral;  . FRONTAL SINUS EXPLORATION Bilateral 09/09/2016   Procedure: FRONTAL RECESS SINUS EXPLORATION;  Surgeon: Newman Pies, MD;  Location: [facility];  Service: ENT;  Laterality: Bilateral;  . MAXILLARY ANTROSTOMY Bilateral 09/09/2016   Procedure: MAXILLARY ANTROSTOMY WITH TISSUE REMOVAL;  Surgeon: Newman Pies, MD;  Location: [facility];   Service: ENT;  Laterality: Bilateral;  . OVARIAN CYST SURGERY    . SINUS ENDO W/FUSION Bilateral 09/09/2016   Procedure: ENDOSCOPIC SINUS SURGERY WITH NAVIGATION;  Surgeon: Newman Pies, MD;  Location: [facility];  Service: ENT;  Laterality: Bilateral;  . TOTAL VAGINAL HYSTERECTOMY    . TURBINATE REDUCTION Bilateral 09/09/2016   Procedure: BILATERAL TURBINATE REDUCTION;  Surgeon: Newman Pies, MD;  Location: [facility];  Service: ENT;  Laterality: Bilateral;    OB History    Gravida  3   Para  3   Term  3   Preterm      AB      Living  3     SAB      TAB      Ectopic      Multiple      Live Births  3            Home Medications    Prior to Admission medications   Medication Sig Start Date End Date Taking? Authorizing Provider  buPROPion (WELLBUTRIN XL) 300 MG 24 hr tablet TAKE 1 TABLET BY MOUTH DAILY 10/05/19   Sheliah Hatch, MD  estradiol (ESTRACE VAGINAL) 0.1 MG/GM vaginal cream 2 grams intravaginally daily at bedtime for 2 weeks, then reduce to 1 gram intravaginally weekly at bedtime for maintenance. 04/07/18   Brock Bad, MD  fexofenadine (ALLEGRA) 180 MG tablet Take 180 mg by mouth daily.  [provider]  gabapentin (NEURONTIN) 300 MG capsule TAKE 1 CAPSULE BY MOUTH DAILY AT BEDTIME 01/27/19   Midge Minium, MD  levocetirizine (XYZAL) 5 MG tablet Take 5 mg by mouth every evening.    [provider]  linaclotide Rolan Lipa) 72 MCG capsule Take 1 capsule (72 mcg total) by mouth daily before breakfast. 02/04/18   Midge Minium, MD  magnesium 30 MG tablet Take 30 mg by mouth every other day.    [provider]  Multiple Vitamin (MULTIVITAMIN WITH MINERALS) TABS tablet Take 1 tablet by mouth daily.    [provider]  ondansetron (ZOFRAN) 4 MG tablet Take 1 tablet (4 mg total) by mouth every 8 (eight) hours as needed for nausea or vomiting. 10/20/19   Midge Minium, MD  pantoprazole  (PROTONIX) 40 MG tablet Take 1 tablet (40 mg total) by mouth daily. 10/20/19   Midge Minium, MD  sucralfate (CARAFATE) 1 g tablet Take 1 tablet (1 g total) by mouth 4 (four) times daily -  with meals and at bedtime. 10/20/19   Midge Minium, MD  topiramate (TOPAMAX) 200 MG tablet TAKE 1 TABLET BY MOUTH TWICE DAILY 08/18/19   Midge Minium, MD  traZODone (DESYREL) 50 MG tablet TAKE 1 TABLET BY MOUTH EACH NIGHT AT BEDTIME FOR SLEEP 12/09/19   Midge Minium, MD  triamcinolone (NASACORT) 55 MCG/ACT AERO nasal inhaler Place 2 sprays into the nose daily. 11/30/19   Brunetta Jeans, PA-C  vitamin E 1000 UNIT capsule Take 1,000 Units by mouth daily.    [provider]    Family History Family History  Problem Relation Age of Onset  . Kidney Stones Daughter   . Heart attack Father 77  . Diabetes Maternal Grandmother     Social History Social History   Tobacco Use  . Smoking status: Never Smoker  . Smokeless tobacco: Never Used  Substance Use Topics  . Alcohol use: Yes    Comment: rarely  . Drug use: No     Allergies   Bee venom, Azithromycin, Diphenhydramine hcl, Erythromycin, Latex, Morphine and related, Penicillins, Percocet [oxycodone-acetaminophen], Sulfonamide derivatives, and Tetracyclines & related   Review of Systems Review of Systems  See HPI  Physical Exam Triage Vital Signs ED Triage Vitals  Enc Vitals Group     BP 01/16/20 1825 130/73     Pulse Rate 01/16/20 1825 81     Resp 01/16/20 1825 18     Temp 01/16/20 1825 97.9 F (36.6 C)     Temp src --      SpO2 01/16/20 1825 100 %     Weight --      Height --      Head Circumference --      Peak Flow --      Pain Score 01/16/20 1821 5     Pain Loc --      Pain Edu? --      Excl. in [place]? --    No data found.  Updated Vital Signs BP 130/73   Pulse 81   Temp 97.9 F (36.6 C)   Resp 18   SpO2 100%   Visual Acuity Right Eye Distance:   Left Eye Distance:   Bilateral  Distance:    Right Eye Near:   Left Eye Near:    Bilateral Near:     Physical Exam Gen: NAD, alert, cooperative with exam, well-appearing Neuro: normal tone, normal sensation to touch Psych:  normal insight, alert and oriented MSK:  Left foot: No tenderness palpation of the metatarsal shafts. No changes at the first MTP joint great toe. Ecchymosis and swelling of the fourth and fifth digit. Limited flexion extension of the fourth and fifth digit. Neurovascular intact   UC Treatments / Results  Labs (all labs ordered are listed, but only abnormal results are displayed) Labs Reviewed - No data to display  EKG   Radiology DG Foot Complete Left  Result Date: 01/16/2020 CLINICAL DATA:  Tripped and fell, left foot pain and swelling EXAM: LEFT FOOT - COMPLETE 3+ VIEW COMPARISON:  None. FINDINGS: Frontal, oblique, and lateral views of the left foot are obtained. No fracture, subluxation, or dislocation. Joint spaces are well preserved. Soft tissues are normal. IMPRESSION: 1. Unremarkable left foot. Electronically Signed   By: Sharlet [place] M.D.   On: 01/16/2020 19:21    Procedures Procedures (including critical care time)  Medications Ordered in UC Medications - No data to display  Initial Impression / Assessment and Plan / UC Course  I have reviewed the triage vital signs and the nursing notes.  Pertinent labs & imaging results that were available during my care of the patient were reviewed by me and considered in my medical decision making (see chart for details).     Ms. Weingartner is a 54 year old female is presenting with pain and swelling of the fourth and fifth digit of the left toe.  Imaging was negative for fracture.  Placed in a postop shoe.  Counseled on buddy taping.  Given indications to follow-up return.  Final Clinical Impressions(s) / UC Diagnoses   Final diagnoses:  Foot pain, left     Discharge Instructions     Please try ice  Please try the post  op shoe for 1-2 weeks.  Please buddy tape the toes for 3-4 weeks     ED Prescriptions    None     PDMP not reviewed this encounter.   Myra Rude, MD 01/16/20 2011

## 2020-01-16 NOTE — ED Triage Notes (Signed)
Pt c/o pain to left foot after kicking doorframe

## 2020-01-16 NOTE — Discharge Instructions (Signed)
Please try ice  Please try the post op shoe for 1-2 weeks.  Please buddy tape the toes for 3-4 weeks

## 2020-03-01 ENCOUNTER — Other Ambulatory Visit: Payer: Self-pay | Admitting: Family Medicine

## 2020-03-09 ENCOUNTER — Other Ambulatory Visit: Payer: Self-pay

## 2020-03-09 ENCOUNTER — Encounter: Payer: Self-pay | Admitting: Physician Assistant

## 2020-03-09 ENCOUNTER — Telehealth (INDEPENDENT_AMBULATORY_CARE_PROVIDER_SITE_OTHER): Payer: Self-pay | Admitting: Physician Assistant

## 2020-03-09 DIAGNOSIS — J0100 Acute maxillary sinusitis, unspecified: Secondary | ICD-10-CM

## 2020-03-09 MED ORDER — MOXIFLOXACIN HCL 400 MG PO TABS: 400.0000 mg | ORAL_TABLET | Freq: Every day | ORAL | 0 refills | Status: DC

## 2020-03-09 NOTE — Progress Notes (Signed)
Virtual Visit via Video   I connected with patient on 03/09/20 at 11:30 AM EDT by a video enabled telemedicine application and verified that I am speaking with the correct person using two identifiers.  Location patient: Home Location provider: [facility], Office Persons participating in the virtual visit: Patient, Provider, CMA (Patina Moore)  I discussed the limitations of evaluation and management by telemedicine and the availability of in person appointments. The patient expressed understanding and agreed to proceed.  Subjective:   HPI:   Patient presents via [facility] today c/o > 1 week of sinus pressure, nasal congestion, headache and maxillary sinus pain/tooth pain bilaterally. Denies fever, chills. Notes ear pressure bilaterally. Denies recent travel or sick contact.  Denies loss of taste or smell.Marland Kitchen Has been using saline rinse. Flonase, Mucinex-sinus OTC.  Denies any chest pain or shortness of breath.  Has had both Covid vaccines.  Patient has significant history of sinusitis.  Notes current episode is similar to prior infections.  ROS:   See pertinent positives and negatives per HPI.  Patient Active Problem List   Diagnosis Date Noted  . Neuropathy of both feet 12/22/2017  . Physical exam 09/25/2016  . Chronic idiopathic constipation 10/13/2014  . Contact dermatitis due to jewelry 02/19/2014  . Acute sinusitis 11/19/2013  . Insomnia 06/22/2013  . Pain in right wrist 06/22/2013  . Soft tissue mass 06/22/2013  . Plantar fasciitis of left foot 03/14/2013  . Shaky 02/22/2013  . Depression with anxiety 10/28/2010  . Migraine without aura 10/28/2010  . GERD 10/28/2010  . CHEST PAIN, ATYPICAL, HX OF 03/27/2010  . ALLERGIC RHINITIS, SEASONAL 12/07/2009  . Fibromyalgia 10/26/2009  . POLYARTHRITIS 10/19/2009    Social History   Tobacco Use  . Smoking status: Never Smoker  . Smokeless tobacco: Never Used  Substance Use Topics  . Alcohol use: Yes    Comment:  rarely    Current Outpatient Medications:  .  buPROPion (WELLBUTRIN XL) 300 MG 24 hr tablet, TAKE 1 TABLET BY MOUTH DAILY, Disp: 90 tablet, Rfl: 1 .  estradiol (ESTRACE VAGINAL) 0.1 MG/GM vaginal cream, 2 grams intravaginally daily at bedtime for 2 weeks, then reduce to 1 gram intravaginally weekly at bedtime for maintenance., Disp: 42.5 g, Rfl: 12 .  fexofenadine (ALLEGRA) 180 MG tablet, Take 180 mg by mouth daily., Disp: , Rfl:  .  levocetirizine (XYZAL) 5 MG tablet, Take 5 mg by mouth every evening., Disp: , Rfl:  .  linaclotide (LINZESS) 72 MCG capsule, Take 1 capsule (72 mcg total) by mouth daily before breakfast., Disp: 90 capsule, Rfl: 0 .  magnesium 30 MG tablet, Take 30 mg by mouth every other day., Disp: , Rfl:  .  Multiple Vitamin (MULTIVITAMIN WITH MINERALS) TABS tablet, Take 1 tablet by mouth daily., Disp: , Rfl:  .  ondansetron (ZOFRAN) 4 MG tablet, Take 1 tablet (4 mg total) by mouth every 8 (eight) hours as needed for nausea or vomiting., Disp: 20 tablet, Rfl: 0 .  pantoprazole (PROTONIX) 40 MG tablet, TAKE 1 TABLET BY MOUTH DAILY, Disp: 30 tablet, Rfl: 3 .  sucralfate (CARAFATE) 1 g tablet, Take 1 tablet (1 g total) by mouth 4 (four) times daily -  with meals and at bedtime., Disp: 90 tablet, Rfl: 0 .  topiramate (TOPAMAX) 200 MG tablet, TAKE 1 TABLET BY MOUTH TWICE DAILY, Disp: 180 tablet, Rfl: 0 .  traZODone (DESYREL) 50 MG tablet, TAKE 1 TABLET BY MOUTH EACH NIGHT AT BEDTIME FOR SLEEP, Disp: 30 tablet,  Rfl: 6 .  triamcinolone (NASACORT) 55 MCG/ACT AERO nasal inhaler, Place 2 sprays into the nose daily., Disp: 1 Inhaler, Rfl: 0 .  vitamin E 1000 UNIT capsule, Take 1,000 Units by mouth daily., Disp: , Rfl:  .  gabapentin (NEURONTIN) 300 MG capsule, TAKE 1 CAPSULE BY MOUTH DAILY AT BEDTIME (Patient not taking: Reported on 03/09/2020), Disp: 30 capsule, Rfl: 1  Allergies  Allergen Reactions  . Bee Venom   . Azithromycin     REACTION: hives  . Diphenhydramine Hcl     REACTION:  hives  . Erythromycin     REACTION: hives  . Latex     REACTION: rash  . Morphine And Related Nausea And Vomiting  . Penicillins     REACTION: rash, fever  . Percocet [Oxycodone-Acetaminophen] Other (See Comments)    Hallucinations   . Sulfonamide Derivatives     REACTION: rash, fever  . Tetracyclines & Related     Objective:   There were no vitals taken for this visit.  Patient is well-developed, well-nourished in no acute distress.  Resting comfortably at home.  Head is normocephalic, atraumatic.  No labored breathing.  Speech is clear and coherent with logical content.  Patient is alert and oriented at baseline.  Positive TTP maxillary sinuses  Assessment and Plan:   1. Acute non-recurrent maxillary sinusitis Rx Avelox due to multiple drug allergies.  Increase fluids.  Rest.  Saline nasal spray.  Probiotic.  Mucinex as directed.  Humidifier in bedroom.  Call or return to clinic if symptoms are not improving.     Piedad Climes, PA-C 03/09/2020

## 2020-03-14 ENCOUNTER — Other Ambulatory Visit: Payer: Self-pay | Admitting: Family Medicine

## 2020-03-14 NOTE — Telephone Encounter (Signed)
Please advise if ok to fill? Pt has not had a well visit in over 4 years.

## 2020-04-27 ENCOUNTER — Other Ambulatory Visit: Payer: Self-pay | Admitting: Family Medicine

## 2020-06-14 ENCOUNTER — Ambulatory Visit (INDEPENDENT_AMBULATORY_CARE_PROVIDER_SITE_OTHER): Payer: Self-pay | Admitting: Family Medicine

## 2020-06-14 ENCOUNTER — Encounter: Payer: Self-pay | Admitting: Family Medicine

## 2020-06-14 ENCOUNTER — Other Ambulatory Visit: Payer: Self-pay

## 2020-06-14 VITALS — BP 118/68 | HR 65 | Temp 99.5°F | Resp 16 | Ht 62.0 in | Wt 124.6 lb

## 2020-06-14 DIAGNOSIS — R1013 Epigastric pain: Secondary | ICD-10-CM

## 2020-06-14 DIAGNOSIS — G4700 Insomnia, unspecified: Secondary | ICD-10-CM

## 2020-06-14 DIAGNOSIS — Z23 Encounter for immunization: Secondary | ICD-10-CM

## 2020-06-14 MED ORDER — ZOLPIDEM TARTRATE ER 12.5 MG PO TBCR
12.5000 mg | EXTENDED_RELEASE_TABLET | Freq: Every evening | ORAL | 3 refills | Status: DC | PRN
Start: 2020-06-14 — End: 2020-07-03

## 2020-06-14 NOTE — Patient Instructions (Signed)
Follow up by phone or MyChart to let me know if the Ambien is working Make sure you use [employer] to get a better price Use the Ambien nightly for insomnia- but if you start sleep walking, let me know! RESTART the Carafate before meals and before bed to see if that helps the abdominal pain Please log your symptoms so we can figure out when things are better/worse and what causes that to change Call with any questions or concerns Hang in there!!

## 2020-06-14 NOTE — Progress Notes (Signed)
Subjective:    Patient ID: Martha Moss, female    DOB: December 04, 1965, 54 y.o.   MRN: [id number]  HPI Insomnia- pt reports she is not sleeping through the night.  Able to fall asleep but waking frequently during the night.  Currently on Trazodone 50mg  nightly.  Pt feels that racing thoughts have something to do w/ it.  She now has a journal next to her bed to write down any thoughts.  Has eliminated blue light before bed and focused on sleep hygiene.  Pt did well previously w/ Ambien but she did sleepwalk.  Pt is waking exhausted.  Pt reports mood is good at this time.  RUQ pain- sxs have been present 'for a long long time' but started to worsen 'several months ago'.  Occasional nausea.  Has not noticed an association w/ food.  Pt reports there is a 'dull constant pain' but at times will worsen.  Denies stabbing or throbbing.  No improvement w/ BM and BMs are regular.  When pain is severe it will radiate down R side of abd into pelvis and at times will feel like vagina is spasming.  Currently on Protonix.  Took a leftover Carafate this AM.  S/p cholecystectomy.  CT done 2019  Flu shot today   Review of Systems For ROS see HPI   This visit occurred during the SARS-CoV-2 public health emergency.  Safety protocols were in place, including screening questions prior to the visit, additional usage of staff PPE, and extensive cleaning of exam room while observing appropriate contact time as indicated for disinfecting solutions.       Objective:   Physical Exam Vitals reviewed.  Constitutional:      General: She is not in acute distress.    Appearance: Normal appearance. She is not ill-appearing.  HENT:     Head: Normocephalic and atraumatic.  Eyes:     General: No scleral icterus.    Extraocular Movements: Extraocular movements intact.     Conjunctiva/sclera: Conjunctivae normal.     Pupils: Pupils are equal, round, and reactive to light.  Cardiovascular:     Rate and Rhythm: Normal  rate and regular rhythm.     Pulses: Normal pulses.     Heart sounds: Normal heart sounds. No murmur heard.  No gallop.   Pulmonary:     Effort: Pulmonary effort is normal. No respiratory distress.     Breath sounds: Normal breath sounds. No wheezing.  Abdominal:     General: Abdomen is flat. There is no distension.     Palpations: Abdomen is soft.     Tenderness: There is abdominal tenderness (TTP RUQ towards epigastrum). There is no guarding or rebound.  Musculoskeletal:     Cervical back: Normal range of motion and neck supple. No tenderness.  Lymphadenopathy:     Cervical: No cervical adenopathy.  Neurological:     Mental Status: She is alert.           Assessment & Plan:  Epigastric pain- new to provider, ongoing for pt.  She reports this has been going on for years but has intensified recently.  Denies vomiting or diarrhea.  + occasional nausea.  No change in BMs.  Pt can't relate if pain improves or worsens w/ eating.  Taking Protonix daily.  Has hx of gastritis.  Will restart Carafate 4x/day and monitor for improvement.  If none, will likely need GI referral or imaging to assess.  Pt expressed understanding and is in agreement w/  plan.

## 2020-06-14 NOTE — Assessment & Plan Note (Signed)
Deteriorated.  Pt is having issues staying asleep despite good sleep hygiene and use of Trazodone.  Reports Ambien was the most effective medication in the past but she would sleepwalk.  Discussed using the extended release version and making sure there was someone in the house to monitor her for parasomnias.  Pt expressed understanding and is in agreement w/ plan.

## 2020-06-30 ENCOUNTER — Emergency Department (HOSPITAL_COMMUNITY): Payer: Self-pay

## 2020-06-30 ENCOUNTER — Emergency Department (HOSPITAL_COMMUNITY)
Admission: EM | Admit: 2020-06-30 | Discharge: 2020-06-30 | Disposition: A | Discharge status: Home / Self Care | Payer: Self-pay | Attending: Emergency Medicine | Admitting: Emergency Medicine

## 2020-06-30 ENCOUNTER — Other Ambulatory Visit: Payer: Self-pay

## 2020-06-30 DIAGNOSIS — R471 Dysarthria and anarthria: Secondary | ICD-10-CM

## 2020-06-30 DIAGNOSIS — F419 Anxiety disorder, unspecified: Secondary | ICD-10-CM | POA: Insufficient documentation

## 2020-06-30 DIAGNOSIS — Z91040 Latex allergy status: Secondary | ICD-10-CM | POA: Insufficient documentation

## 2020-06-30 DIAGNOSIS — R202 Paresthesia of skin: Secondary | ICD-10-CM | POA: Insufficient documentation

## 2020-06-30 DIAGNOSIS — R519 Headache, unspecified: Secondary | ICD-10-CM

## 2020-06-30 LAB — CBC WITH DIFFERENTIAL/PLATELET
Abs Immature Granulocytes: 0.01 10*3/uL (ref 0.00–0.07)
Basophils Absolute: 0.1 10*3/uL (ref 0.0–0.1)
Basophils Relative: 1 %
Eosinophils Absolute: 0.1 10*3/uL (ref 0.0–0.5)
Eosinophils Relative: 1 %
HCT: 42.5 % (ref 36.0–46.0)
Hemoglobin: 13.3 g/dL (ref 12.0–15.0)
Immature Granulocytes: 0 %
Lymphocytes Relative: 56 %
Lymphs Abs: 3.4 10*3/uL (ref 0.7–4.0)
MCH: 30.7 pg (ref 26.0–34.0)
MCHC: 31.3 g/dL (ref 30.0–36.0)
MCV: 98.2 fL (ref 80.0–100.0)
Monocytes Absolute: 0.6 10*3/uL (ref 0.1–1.0)
Monocytes Relative: 10 %
Neutro Abs: 1.9 10*3/uL (ref 1.7–7.7)
Neutrophils Relative %: 32 %
Platelets: 262 10*3/uL (ref 150–400)
RBC: 4.33 MIL/uL (ref 3.87–5.11)
RDW: 13 % (ref 11.5–15.5)
WBC: 6.1 10*3/uL (ref 4.0–10.5)
nRBC: 0 % (ref 0.0–0.2)

## 2020-06-30 LAB — I-STAT CHEM 8, ED
BUN: 20 mg/dL (ref 6–20)
Calcium, Ion: 1.1 mmol/L — ABNORMAL LOW (ref 1.15–1.40)
Chloride: 110 mmol/L (ref 98–111)
Creatinine, Ser: 0.7 mg/dL (ref 0.44–1.00)
Glucose, Bld: 96 mg/dL (ref 70–99)
HCT: 41 % (ref 36.0–46.0)
Hemoglobin: 13.9 g/dL (ref 12.0–15.0)
Potassium: 4.1 mmol/L (ref 3.5–5.1)
Sodium: 144 mmol/L (ref 135–145)
TCO2: 25 mmol/L (ref 22–32)

## 2020-06-30 LAB — COMPREHENSIVE METABOLIC PANEL
ALT: 20 U/L (ref 0–44)
AST: 28 U/L (ref 15–41)
Albumin: 4.2 g/dL (ref 3.5–5.0)
Alkaline Phosphatase: 63 U/L (ref 38–126)
Anion gap: 10 (ref 5–15)
BUN: 14 mg/dL (ref 6–20)
CO2: 24 mmol/L (ref 22–32)
Calcium: 9.7 mg/dL (ref 8.9–10.3)
Chloride: 109 mmol/L (ref 98–111)
Creatinine, Ser: 0.85 mg/dL (ref 0.44–1.00)
GFR, Estimated: >60 mL/min (ref >60)
Glucose, Bld: 98 mg/dL (ref 70–99)
Potassium: 4.1 mmol/L (ref 3.5–5.1)
Sodium: 143 mmol/L (ref 135–145)
Total Bilirubin: 0.6 mg/dL (ref 0.3–1.2)
Total Protein: 7.3 g/dL (ref 6.5–8.1)

## 2020-06-30 MED ORDER — LORAZEPAM 2 MG/ML IJ SOLN
1.0000 mg | Freq: Once | INTRAMUSCULAR | Status: AC
  Administered 2020-06-30: 1 mg via INTRAVENOUS
  Filled 2020-06-30: qty 1

## 2020-06-30 MED ORDER — SODIUM CHLORIDE 0.9 % IV BOLUS: 1000.0000 mL | Freq: Once | INTRAVENOUS | Status: DC

## 2020-06-30 MED ORDER — KETOROLAC TROMETHAMINE 30 MG/ML IJ SOLN
30.0000 mg | Freq: Once | INTRAMUSCULAR | Status: AC
  Administered 2020-06-30: 30 mg via INTRAVENOUS
  Filled 2020-06-30: qty 1

## 2020-06-30 MED ORDER — PROMETHAZINE HCL 25 MG/ML IJ SOLN
12.5000 mg | Freq: Once | INTRAMUSCULAR | Status: AC
  Administered 2020-06-30: 12.5 mg via INTRAVENOUS
  Filled 2020-06-30: qty 1

## 2020-06-30 NOTE — ED Notes (Addendum)
PT in MRI.

## 2020-06-30 NOTE — ED Provider Notes (Signed)
[facility] Provider Note   CSN: [id number] Arrival date & time: 06/30/20  1734     History Chief Complaint  Patient presents with  . Stroke Symptoms    Martha Moss is a 54 y.o. female history depression, fibromyalgia, reflux, migraines here presenting with trouble speaking. Patient states that she usually had trouble sleeping. She states that last night she barely got any sleep. She states that she is this morning she just has trouble getting words out. She also feels very foggy. She states that she has numbness bilateral feet as well. She actually saw neurology previously for similar symptoms and had negative EEG and MRI. Patient has been under a lot of stress lately.  The history is provided by the patient.       Past Medical History:  Diagnosis Date  . Depression   . Fibromyalgia   . GERD (gastroesophageal reflux disease)   . Migraine   . PONV (postoperative nausea and vomiting)     Patient Active Problem List   Diagnosis Date Noted  . Neuropathy of both feet 12/22/2017  . Physical exam 09/25/2016  . Chronic idiopathic constipation 10/13/2014  . Contact dermatitis due to jewelry 02/19/2014  . Acute sinusitis 11/19/2013  . Insomnia 06/22/2013  . Pain in right wrist 06/22/2013  . Soft tissue mass 06/22/2013  . Plantar fasciitis of left foot 03/14/2013  . Shaky 02/22/2013  . Depression with anxiety 10/28/2010  . Migraine without aura 10/28/2010  . GERD 10/28/2010  . CHEST PAIN, ATYPICAL, HX OF 03/27/2010  . ALLERGIC RHINITIS, SEASONAL 12/07/2009  . Fibromyalgia 10/26/2009  . POLYARTHRITIS 10/19/2009    Past Surgical History:  Procedure Laterality Date  . CHOLECYSTECTOMY    . ETHMOIDECTOMY Bilateral 09/09/2016   Procedure: ETHMOIDECTOMY;  Surgeon: Newman PiesSu Teoh, MD;  Location: [facility];  Service: ENT;  Laterality: Bilateral;  . FRONTAL SINUS EXPLORATION Bilateral 09/09/2016   Procedure: FRONTAL RECESS  SINUS EXPLORATION;  Surgeon: Newman PiesSu Teoh, MD;  Location: [facility];  Service: ENT;  Laterality: Bilateral;  . MAXILLARY ANTROSTOMY Bilateral 09/09/2016   Procedure: MAXILLARY ANTROSTOMY WITH TISSUE REMOVAL;  Surgeon: Newman PiesSu Teoh, MD;  Location: [facility];  Service: ENT;  Laterality: Bilateral;  . OVARIAN CYST SURGERY    . SINUS ENDO W/FUSION Bilateral 09/09/2016   Procedure: ENDOSCOPIC SINUS SURGERY WITH NAVIGATION;  Surgeon: Newman PiesSu Teoh, MD;  Location: [facility];  Service: ENT;  Laterality: Bilateral;  . TOTAL VAGINAL HYSTERECTOMY    . TURBINATE REDUCTION Bilateral 09/09/2016   Procedure: BILATERAL TURBINATE REDUCTION;  Surgeon: Newman PiesSu Teoh, MD;  Location: [facility];  Service: ENT;  Laterality: Bilateral;     OB History    Gravida  3   Para  3   Term  3   Preterm      AB      Living  3     SAB      TAB      Ectopic      Multiple      Live Births  3           Family History  Problem Relation Age of Onset  . Kidney Stones Daughter   . Heart attack Father [id number]  . Diabetes Maternal Grandmother     Social History   Tobacco Use  . Smoking status: Never Smoker  . Smokeless tobacco: Never Used  Vaping Use  . Vaping Use: Never used  Substance  Use Topics  . Alcohol use: Yes    Comment: rarely  . Drug use: No    Home Medications Prior to Admission medications   Medication Sig Start Date End Date Taking? Authorizing Provider  buPROPion (WELLBUTRIN XL) 300 MG 24 hr tablet TAKE 1 TABLET BY MOUTH DAILY 04/27/20   Sheliah Hatch, MD  estradiol (ESTRACE VAGINAL) 0.1 MG/GM vaginal cream 2 grams intravaginally daily at bedtime for 2 weeks, then reduce to 1 gram intravaginally weekly at bedtime for maintenance. 04/07/18   Brock Bad, MD  fexofenadine (ALLEGRA) 180 MG tablet Take 180 mg by mouth daily.    [provider]  gabapentin (NEURONTIN) 300 MG capsule TAKE 1 CAPSULE BY MOUTH DAILY AT BEDTIME Patient  not taking: Reported on 03/09/2020 01/27/19   Sheliah Hatch, MD  levocetirizine (XYZAL) 5 MG tablet Take 5 mg by mouth every evening.    [provider]  linaclotide Karlene Einstein) 72 MCG capsule Take 1 capsule (72 mcg total) by mouth daily before breakfast. 02/04/18   Sheliah Hatch, MD  magnesium 30 MG tablet Take 30 mg by mouth every other day.    [provider]  moxifloxacin (AVELOX) 400 MG tablet Take 1 tablet (400 mg total) by mouth daily. 03/09/20   Waldon Merl, PA-C  Multiple Vitamin (MULTIVITAMIN WITH MINERALS) TABS tablet Take 1 tablet by mouth daily.    [provider]  ondansetron (ZOFRAN) 4 MG tablet Take 1 tablet (4 mg total) by mouth every 8 (eight) hours as needed for nausea or vomiting. 10/20/19   Sheliah Hatch, MD  pantoprazole (PROTONIX) 40 MG tablet TAKE 1 TABLET BY MOUTH DAILY 03/02/20   Sheliah Hatch, MD  sucralfate (CARAFATE) 1 g tablet Take 1 tablet (1 g total) by mouth 4 (four) times daily -  with meals and at bedtime. 10/20/19   Sheliah Hatch, MD  topiramate (TOPAMAX) 200 MG tablet TAKE 1 TABLET BY MOUTH TWICE DAILY 03/14/20   Sheliah Hatch, MD  traZODone (DESYREL) 50 MG tablet TAKE 1 TABLET BY MOUTH EACH NIGHT AT BEDTIME FOR SLEEP 12/09/19   Sheliah Hatch, MD  triamcinolone (NASACORT) 55 MCG/ACT AERO nasal inhaler Place 2 sprays into the nose daily. 11/30/19   Waldon Merl, PA-C  vitamin E 1000 UNIT capsule Take 1,000 Units by mouth daily.    [provider]  zolpidem (AMBIEN CR) 12.5 MG CR tablet Take 1 tablet (12.5 mg total) by mouth at bedtime as needed for sleep. 06/14/20   Sheliah Hatch, MD    Allergies    Bee venom, Azithromycin, Diphenhydramine hcl, Erythromycin, Latex, Morphine and related, Penicillins, Percocet [oxycodone-acetaminophen], Sulfonamide derivatives, and Tetracyclines & related  Review of Systems   Review of Systems  Neurological: Positive for speech difficulty.  All  other systems reviewed and are negative.   Physical Exam Updated Vital Signs BP (!) 144/75   Pulse 91   Temp 98.6 F (37 C) (Oral)   Resp 13   Ht 5\' 2"  (1.575 m)   Wt 56 kg   SpO2 100%   BMI 22.58 kg/m   Physical Exam Vitals and nursing note reviewed.  Constitutional:      Comments: Anxious  HENT:     Head: Normocephalic.     Nose: Nose normal.     Mouth/Throat:     Mouth: Mucous membranes are moist.  Eyes:     Extraocular Movements: Extraocular movements intact.     Pupils: Pupils  are equal, round, and reactive to light.  Cardiovascular:     Rate and Rhythm: Normal rate and regular rhythm.     Pulses: Normal pulses.     Heart sounds: Normal heart sounds.  Pulmonary:     Effort: Pulmonary effort is normal.     Breath sounds: Normal breath sounds.  Abdominal:     General: Abdomen is flat.     Palpations: Abdomen is soft.  Musculoskeletal:        General: Normal range of motion.     Cervical back: Normal range of motion and neck supple.  Skin:    General: Skin is warm.     Capillary Refill: Capillary refill takes less than 2 seconds.  Neurological:     Mental Status: She is alert.     Comments: Some trouble speaking but eventually able to get words out. Patient seems to be stuttering a little bit. Patient has no facial droop. Normal strength bilateral arms and legs.  Psychiatric:        Mood and Affect: Mood normal.        Behavior: Behavior normal.     ED Results / Procedures / Treatments   Labs (all labs ordered are listed, but only abnormal results are displayed) Labs Reviewed  I-STAT CHEM 8, ED - Abnormal; Notable for the following components:      Result Value   Calcium, Ion 1.10 (*)    All other components within normal limits  CBC WITH DIFFERENTIAL/PLATELET  COMPREHENSIVE METABOLIC PANEL    EKG EKG Interpretation  Date/Time:  Saturday June 30 2020 17:47:33 EST Ventricular Rate:  86 PR Interval:    QRS Duration: 100 QT  Interval:  374 QTC Calculation: 448 R Axis:   97 Text Interpretation: Sinus rhythm Borderline right axis deviation Low voltage, precordial leads No significant change since last tracing Confirmed by Richardean Canal [phone]) on 06/30/2020 7:46:18 PM   Radiology MR BRAIN WO CONTRAST  Result Date: 06/30/2020 CLINICAL DATA:  Abnormal speech EXAM: MRI HEAD WITHOUT CONTRAST TECHNIQUE: Multiplanar, multiecho pulse sequences of the brain and surrounding structures were obtained without intravenous contrast. COMPARISON:  None. FINDINGS: Brain: There is no acute infarction or intracranial hemorrhage. There is no intracranial mass, mass effect, or edema. There is no hydrocephalus or extra-axial fluid collection. Minimal small foci of T2 hyperintensity in the supratentorial white matter likely reflect nonspecific gliosis/demyelination of doubtful clinical significance. Ventricles and sulci are normal in size and configuration. Vascular: Major vessel flow voids at the skull base are preserved. Skull and upper cervical spine: Normal marrow signal is preserved. Sinuses/Orbits: Paranasal sinuses are aerated. Orbits are unremarkable. Other: Sella is unremarkable.  Mastoid air cells are clear. IMPRESSION: No evidence of recent infarction, hemorrhage, or mass. Electronically Signed   By: Guadlupe Spanish M.D.   On: 06/30/2020 19:04    Procedures Procedures (including critical care time)  Medications Ordered in ED Medications  sodium chloride 0.9 % bolus 1,000 mL (has no administration in time range)  promethazine (PHENERGAN) injection 12.5 mg (12.5 mg Intravenous Given 06/30/20 1959)  ketorolac (TORADOL) 30 MG/ML injection 30 mg (30 mg Intravenous Given 06/30/20 1958)  LORazepam (ATIVAN) injection 1 mg (1 mg Intravenous Given 06/30/20 1959)    ED Course  I have reviewed the triage vital signs and the nursing notes.  Pertinent labs & imaging results that were available during my care of the patient were reviewed by  me and considered in my medical decision making (see chart for  details).    MDM Rules/Calculators/A&P                         Martha Moss is a 54 y.o. female here presenting with trouble speaking. Symptoms seem to get worse after she could not sleep last night. Patient seem to be under a lot of stress. Patient is outside the window for code stroke. Her exam seems inconsistent as well. Plan to get MRI brain and CBC and BMP.  8:27 PM Labs and MRI unremarkable. Patient still has some trouble speaking. Talked to Dr. Wilford Corner from neurology. He reviewed notes from Dr. Lucia Gaskins from outpatient neuro. patient has some idiopathic neuropathy previously. She had extensive work-up. Given negative MRI he thinks likely complex migraine versus functional. He does not recommend any further work-up in the hospital.   9:24 PM Felt better after migraine cocktail. Stable for discharge home    Final Clinical Impression(s) / ED Diagnoses Final diagnoses:  None    Rx / DC Orders ED Discharge Orders    None       Charlynne Pander, MD 06/30/20 2125

## 2020-06-30 NOTE — ED Notes (Signed)
Pt back from MRI 

## 2020-06-30 NOTE — ED Notes (Signed)
Patient verbalizes understanding of discharge instructions. Opportunity for questioning and answers were provided. Armband removed by staff, pt discharged from ED via wheelchair with husband.  

## 2020-06-30 NOTE — ED Triage Notes (Signed)
BIB [facility] w/ complaints of stroke like symptoms. Pt noted to have dysphagia having a hard time expressing herself and getting words out. Words are jumbled when talking in sentences. Last known normal per pt's husband was 2200 hrs on 06/29/20. Other stroke symptoms negative with EMS. EMS noted the pt has been under a lot of stress lately with work situation. Pt v/s w/ EMS 160/90 HR-90 Resp-24 CBG 126.

## 2020-06-30 NOTE — Discharge Instructions (Signed)
Please continue your current meds.  Your trouble speaking likely from lack of sleep and headaches.  Please follow-up with neurologist outpatient.  Your MRI today did not show any stroke  Return to ER if you have worse headaches, numbness, weakness, trouble speaking

## 2020-07-03 ENCOUNTER — Inpatient Hospital Stay (HOSPITAL_COMMUNITY): Payer: Self-pay

## 2020-07-03 ENCOUNTER — Other Ambulatory Visit: Payer: Self-pay

## 2020-07-03 ENCOUNTER — Emergency Department (HOSPITAL_COMMUNITY): Payer: Self-pay

## 2020-07-03 ENCOUNTER — Inpatient Hospital Stay (HOSPITAL_COMMUNITY)
Admission: EM | Admit: 2020-07-03 | Discharge: 2020-07-06 | DRG: 101 | Disposition: A | Discharge status: Home / Self Care | Payer: Self-pay | Source: Ambulatory Visit | Attending: Internal Medicine | Admitting: Internal Medicine

## 2020-07-03 ENCOUNTER — Ambulatory Visit (INDEPENDENT_AMBULATORY_CARE_PROVIDER_SITE_OTHER): Payer: Self-pay | Admitting: Neurology

## 2020-07-03 ENCOUNTER — Encounter: Payer: Self-pay | Admitting: Neurology

## 2020-07-03 ENCOUNTER — Other Ambulatory Visit (HOSPITAL_COMMUNITY): Payer: Self-pay

## 2020-07-03 VITALS — BP 110/68 | HR 91 | Ht 62.0 in | Wt 121.2 lb

## 2020-07-03 DIAGNOSIS — R569 Unspecified convulsions: Secondary | ICD-10-CM

## 2020-07-03 DIAGNOSIS — E785 Hyperlipidemia, unspecified: Secondary | ICD-10-CM | POA: Diagnosis present

## 2020-07-03 DIAGNOSIS — K219 Gastro-esophageal reflux disease without esophagitis: Secondary | ICD-10-CM | POA: Diagnosis present

## 2020-07-03 DIAGNOSIS — Z823 Family history of stroke: Secondary | ICD-10-CM

## 2020-07-03 DIAGNOSIS — Z885 Allergy status to narcotic agent status: Secondary | ICD-10-CM

## 2020-07-03 DIAGNOSIS — G4089 Other seizures: Principal | ICD-10-CM | POA: Diagnosis present

## 2020-07-03 DIAGNOSIS — M546 Pain in thoracic spine: Secondary | ICD-10-CM | POA: Diagnosis present

## 2020-07-03 DIAGNOSIS — Z881 Allergy status to other antibiotic agents status: Secondary | ICD-10-CM

## 2020-07-03 DIAGNOSIS — F445 Conversion disorder with seizures or convulsions: Secondary | ICD-10-CM | POA: Insufficient documentation

## 2020-07-03 DIAGNOSIS — E872 Acidosis: Secondary | ICD-10-CM | POA: Diagnosis present

## 2020-07-03 DIAGNOSIS — F32A Depression, unspecified: Secondary | ICD-10-CM | POA: Diagnosis present

## 2020-07-03 DIAGNOSIS — R1011 Right upper quadrant pain: Secondary | ICD-10-CM

## 2020-07-03 DIAGNOSIS — R739 Hyperglycemia, unspecified: Secondary | ICD-10-CM | POA: Diagnosis present

## 2020-07-03 DIAGNOSIS — Z8249 Family history of ischemic heart disease and other diseases of the circulatory system: Secondary | ICD-10-CM

## 2020-07-03 DIAGNOSIS — R55 Syncope and collapse: Secondary | ICD-10-CM | POA: Diagnosis present

## 2020-07-03 DIAGNOSIS — Z833 Family history of diabetes mellitus: Secondary | ICD-10-CM

## 2020-07-03 DIAGNOSIS — M797 Fibromyalgia: Secondary | ICD-10-CM | POA: Diagnosis present

## 2020-07-03 DIAGNOSIS — I959 Hypotension, unspecified: Secondary | ICD-10-CM | POA: Diagnosis present

## 2020-07-03 DIAGNOSIS — R4182 Altered mental status, unspecified: Secondary | ICD-10-CM | POA: Insufficient documentation

## 2020-07-03 DIAGNOSIS — Z882 Allergy status to sulfonamides status: Secondary | ICD-10-CM

## 2020-07-03 DIAGNOSIS — Z90711 Acquired absence of uterus with remaining cervical stump: Secondary | ICD-10-CM

## 2020-07-03 DIAGNOSIS — G43009 Migraine without aura, not intractable, without status migrainosus: Secondary | ICD-10-CM | POA: Diagnosis present

## 2020-07-03 DIAGNOSIS — G4700 Insomnia, unspecified: Secondary | ICD-10-CM | POA: Diagnosis present

## 2020-07-03 DIAGNOSIS — K7689 Other specified diseases of liver: Secondary | ICD-10-CM | POA: Diagnosis present

## 2020-07-03 DIAGNOSIS — F419 Anxiety disorder, unspecified: Secondary | ICD-10-CM | POA: Diagnosis present

## 2020-07-03 DIAGNOSIS — Z79899 Other long term (current) drug therapy: Secondary | ICD-10-CM

## 2020-07-03 DIAGNOSIS — R1314 Dysphagia, pharyngoesophageal phase: Secondary | ICD-10-CM | POA: Diagnosis present

## 2020-07-03 DIAGNOSIS — F438 Other reactions to severe stress: Secondary | ICD-10-CM | POA: Diagnosis present

## 2020-07-03 DIAGNOSIS — R1319 Other dysphagia: Secondary | ICD-10-CM

## 2020-07-03 DIAGNOSIS — Z20822 Contact with and (suspected) exposure to covid-19: Secondary | ICD-10-CM | POA: Diagnosis present

## 2020-07-03 DIAGNOSIS — Z91040 Latex allergy status: Secondary | ICD-10-CM

## 2020-07-03 DIAGNOSIS — R471 Dysarthria and anarthria: Secondary | ICD-10-CM | POA: Diagnosis present

## 2020-07-03 DIAGNOSIS — Z880 Allergy status to penicillin: Secondary | ICD-10-CM

## 2020-07-03 DIAGNOSIS — R110 Nausea: Secondary | ICD-10-CM

## 2020-07-03 DIAGNOSIS — Z9049 Acquired absence of other specified parts of digestive tract: Secondary | ICD-10-CM

## 2020-07-03 LAB — TYPE AND SCREEN
ABO/RH(D): A POS
Antibody Screen: NEGATIVE

## 2020-07-03 LAB — CBC WITH DIFFERENTIAL/PLATELET
Abs Immature Granulocytes: 0.01 10*3/uL (ref 0.00–0.07)
Abs Immature Granulocytes: 0.03 10*3/uL (ref 0.00–0.07)
Basophils Absolute: 0 10*3/uL (ref 0.0–0.1)
Basophils Absolute: 0.1 10*3/uL (ref 0.0–0.1)
Basophils Relative: 1 %
Basophils Relative: 1 %
Eosinophils Absolute: 0.1 10*3/uL (ref 0.0–0.5)
Eosinophils Absolute: 0 10*3/uL (ref 0.0–0.5)
Eosinophils Relative: 1 %
Eosinophils Relative: 0 %
HCT: 45.4 % (ref 36.0–46.0)
HCT: 41.8 % (ref 36.0–46.0)
Hemoglobin: 14.5 g/dL (ref 12.0–15.0)
Hemoglobin: 13.4 g/dL (ref 12.0–15.0)
Immature Granulocytes: 0 %
Immature Granulocytes: 0 %
Lymphocytes Relative: 33 %
Lymphocytes Relative: 43 %
Lymphs Abs: 2.2 10*3/uL (ref 0.7–4.0)
Lymphs Abs: 3.7 10*3/uL (ref 0.7–4.0)
MCH: 31.2 pg (ref 26.0–34.0)
MCH: 31.2 pg (ref 26.0–34.0)
MCHC: 31.9 g/dL (ref 30.0–36.0)
MCHC: 32.1 g/dL (ref 30.0–36.0)
MCV: 97.6 fL (ref 80.0–100.0)
MCV: 97.4 fL (ref 80.0–100.0)
Monocytes Absolute: 0.4 10*3/uL (ref 0.1–1.0)
Monocytes Absolute: 0.6 10*3/uL (ref 0.1–1.0)
Monocytes Relative: 7 %
Monocytes Relative: 7 %
Neutro Abs: 3.9 10*3/uL (ref 1.7–7.7)
Neutro Abs: 4.2 10*3/uL (ref 1.7–7.7)
Neutrophils Relative %: 58 %
Neutrophils Relative %: 49 %
Platelets: 325 10*3/uL (ref 150–400)
Platelets: 310 10*3/uL (ref 150–400)
RBC: 4.65 MIL/uL (ref 3.87–5.11)
RBC: 4.29 MIL/uL (ref 3.87–5.11)
RDW: 12.8 % (ref 11.5–15.5)
RDW: 12.7 % (ref 11.5–15.5)
WBC: 6.7 10*3/uL (ref 4.0–10.5)
WBC: 8.6 10*3/uL (ref 4.0–10.5)
nRBC: 0 % (ref 0.0–0.2)
nRBC: 0 % (ref 0.0–0.2)

## 2020-07-03 LAB — BASIC METABOLIC PANEL
Anion gap: 10 (ref 5–15)
BUN: 15 mg/dL (ref 6–20)
CO2: 27 mmol/L (ref 22–32)
Calcium: 10 mg/dL (ref 8.9–10.3)
Chloride: 105 mmol/L (ref 98–111)
Creatinine, Ser: 1.04 mg/dL — ABNORMAL HIGH (ref 0.44–1.00)
GFR, Estimated: >60 mL/min (ref >60)
Glucose, Bld: 107 mg/dL — ABNORMAL HIGH (ref 70–99)
Potassium: 4.3 mmol/L (ref 3.5–5.1)
Sodium: 142 mmol/L (ref 135–145)

## 2020-07-03 LAB — CBG MONITORING, ED: Glucose-Capillary: 96 mg/dL (ref 70–99)

## 2020-07-03 LAB — COMPREHENSIVE METABOLIC PANEL
ALT: 15 U/L (ref 0–44)
AST: 21 U/L (ref 15–41)
Albumin: 4.2 g/dL (ref 3.5–5.0)
Alkaline Phosphatase: 63 U/L (ref 38–126)
Anion gap: 11 (ref 5–15)
BUN: 18 mg/dL (ref 6–20)
CO2: 24 mmol/L (ref 22–32)
Calcium: 9.8 mg/dL (ref 8.9–10.3)
Chloride: 106 mmol/L (ref 98–111)
Creatinine, Ser: 0.83 mg/dL (ref 0.44–1.00)
GFR, Estimated: >60 mL/min (ref >60)
Glucose, Bld: 101 mg/dL — ABNORMAL HIGH (ref 70–99)
Potassium: 4.3 mmol/L (ref 3.5–5.1)
Sodium: 141 mmol/L (ref 135–145)
Total Bilirubin: 0.8 mg/dL (ref 0.3–1.2)
Total Protein: 7.1 g/dL (ref 6.5–8.1)

## 2020-07-03 LAB — RAPID URINE DRUG SCREEN, HOSP PERFORMED
Amphetamines: NOT DETECTED
Barbiturates: NOT DETECTED
Benzodiazepines: NOT DETECTED
Cocaine: NOT DETECTED
Opiates: NOT DETECTED
Tetrahydrocannabinol: NOT DETECTED

## 2020-07-03 LAB — ABO/RH: ABO/RH(D): A POS

## 2020-07-03 LAB — HEMOGLOBIN A1C
Hgb A1c MFr Bld: 5.1 % (ref 4.8–5.6)
Mean Plasma Glucose: 99.67 mg/dL

## 2020-07-03 LAB — LACTIC ACID, PLASMA: Lactic Acid, Venous: 3.2 mmol/L (ref 0.5–1.9)

## 2020-07-03 LAB — HIV ANTIBODY (ROUTINE TESTING W REFLEX): HIV Screen 4th Generation wRfx: NONREACTIVE

## 2020-07-03 LAB — SEDIMENTATION RATE: Sed Rate: 9 mm/hr (ref 0–22)

## 2020-07-03 LAB — RESPIRATORY PANEL BY RT PCR (FLU A&B, COVID)
Influenza A by PCR: NEGATIVE
Influenza B by PCR: NEGATIVE
SARS Coronavirus 2 by RT PCR: NEGATIVE

## 2020-07-03 LAB — ETHANOL: Alcohol, Ethyl (B): <10 mg/dL (ref <10)

## 2020-07-03 LAB — AMMONIA: Ammonia: 26 umol/L (ref 9–35)

## 2020-07-03 MED ORDER — PANTOPRAZOLE SODIUM 40 MG PO TBEC: 40.0000 mg | DELAYED_RELEASE_TABLET | Freq: Two times a day (BID) | ORAL | Status: DC

## 2020-07-03 MED ORDER — ACETAMINOPHEN 325 MG PO TABS
650.0000 mg | ORAL_TABLET | ORAL | Status: DC | PRN
  Administered 2020-07-04 – 2020-07-05 (×5): 650 mg via ORAL
  Filled 2020-07-03 (×5): qty 2

## 2020-07-03 MED ORDER — PANTOPRAZOLE SODIUM 40 MG IV SOLR: 40.0000 mg | Freq: Two times a day (BID) | INTRAVENOUS | Status: DC

## 2020-07-03 MED ORDER — GADOBUTROL 1 MMOL/ML IV SOLN
5.5000 mL | Freq: Once | INTRAVENOUS | Status: AC | PRN
  Administered 2020-07-03: 5.5 mL via INTRAVENOUS

## 2020-07-03 MED ORDER — PANTOPRAZOLE SODIUM 40 MG PO TBEC: 40.0000 mg | DELAYED_RELEASE_TABLET | Freq: Every day | ORAL | Status: DC

## 2020-07-03 MED ORDER — ACETAMINOPHEN 650 MG RE SUPP: 650.0000 mg | RECTAL | Status: DC | PRN

## 2020-07-03 MED ORDER — VITAMIN E 45 MG (100 UNIT) PO CAPS
1000.0000 [IU] | ORAL_CAPSULE | Freq: Every day | ORAL | Status: DC
  Filled 2020-07-03 (×2): qty 10

## 2020-07-03 MED ORDER — DEXTROSE-NACL 5-0.9 % IV SOLN: INTRAVENOUS | Status: DC

## 2020-07-03 MED ORDER — MAGNESIUM 30 MG PO TABS: 30.0000 mg | ORAL_TABLET | ORAL | Status: DC

## 2020-07-03 MED ORDER — INSULIN ASPART 100 UNIT/ML ~~LOC~~ SOLN: 0.0000 [IU] | Freq: Three times a day (TID) | SUBCUTANEOUS | Status: DC

## 2020-07-03 MED ORDER — LORATADINE 10 MG PO TABS
10.0000 mg | ORAL_TABLET | Freq: Every day | ORAL | Status: DC
  Administered 2020-07-04 – 2020-07-06 (×3): 10 mg via ORAL
  Filled 2020-07-03 (×3): qty 1

## 2020-07-03 MED ORDER — SODIUM CHLORIDE 0.9 % IV SOLN: INTRAVENOUS | Status: DC

## 2020-07-03 MED ORDER — PROCHLORPERAZINE EDISYLATE 10 MG/2ML IJ SOLN
5.0000 mg | Freq: Once | INTRAMUSCULAR | Status: AC
  Administered 2020-07-03: 5 mg via INTRAVENOUS
  Filled 2020-07-03: qty 2

## 2020-07-03 MED ORDER — KETOROLAC TROMETHAMINE 30 MG/ML IJ SOLN
30.0000 mg | Freq: Once | INTRAMUSCULAR | Status: AC
  Administered 2020-07-03: 30 mg via INTRAVENOUS
  Filled 2020-07-03: qty 1

## 2020-07-03 MED ORDER — ONDANSETRON HCL 4 MG/2ML IJ SOLN: 4.0000 mg | Freq: Four times a day (QID) | INTRAMUSCULAR | Status: DC | PRN

## 2020-07-03 MED ORDER — PANTOPRAZOLE SODIUM 40 MG IV SOLR
40.0000 mg | Freq: Two times a day (BID) | INTRAVENOUS | Status: DC
  Administered 2020-07-03 – 2020-07-06 (×6): 40 mg via INTRAVENOUS
  Filled 2020-07-03 (×7): qty 40

## 2020-07-03 MED ORDER — LACTATED RINGERS IV BOLUS
1000.0000 mL | Freq: Once | INTRAVENOUS | Status: AC
  Administered 2020-07-03: 1000 mL via INTRAVENOUS

## 2020-07-03 MED ORDER — ONDANSETRON HCL 4 MG PO TABS: 4.0000 mg | ORAL_TABLET | Freq: Four times a day (QID) | ORAL | Status: DC | PRN

## 2020-07-03 MED ORDER — BUPROPION HCL ER (XL) 150 MG PO TB24
300.0000 mg | ORAL_TABLET | Freq: Every day | ORAL | Status: DC
  Administered 2020-07-04 – 2020-07-06 (×3): 300 mg via ORAL
  Filled 2020-07-03 (×3): qty 2

## 2020-07-03 MED ORDER — ADULT MULTIVITAMIN W/MINERALS CH
1.0000 | ORAL_TABLET | Freq: Every day | ORAL | Status: DC
  Administered 2020-07-04 – 2020-07-06 (×3): 1 via ORAL
  Filled 2020-07-03 (×3): qty 1

## 2020-07-03 MED ORDER — ENOXAPARIN SODIUM 40 MG/0.4ML ~~LOC~~ SOLN
40.0000 mg | SUBCUTANEOUS | Status: DC
  Administered 2020-07-04 – 2020-07-05 (×2): 40 mg via SUBCUTANEOUS
  Filled 2020-07-03 (×3): qty 0.4

## 2020-07-03 MED ORDER — SUCRALFATE 1 G PO TABS
1.0000 g | ORAL_TABLET | Freq: Three times a day (TID) | ORAL | Status: DC
  Administered 2020-07-04 – 2020-07-06 (×9): 1 g via ORAL
  Filled 2020-07-03 (×10): qty 1

## 2020-07-03 MED ORDER — VALPROATE SODIUM 500 MG/5ML IV SOLN
1000.0000 mg | Freq: Once | INTRAVENOUS | Status: AC
  Administered 2020-07-03: 1000 mg via INTRAVENOUS
  Filled 2020-07-03: qty 10

## 2020-07-03 MED ORDER — TOPIRAMATE 100 MG PO TABS: 100.0000 mg | ORAL_TABLET | Freq: Every day | ORAL | Status: DC

## 2020-07-03 MED ORDER — LEVOCETIRIZINE DIHYDROCHLORIDE 5 MG PO TABS: 5.0000 mg | ORAL_TABLET | Freq: Every evening | ORAL | Status: DC

## 2020-07-03 MED ORDER — SODIUM CHLORIDE 0.9 % IV SOLN
75.0000 mL/h | INTRAVENOUS | Status: DC
  Administered 2020-07-03: 75 mL/h via INTRAVENOUS

## 2020-07-03 NOTE — ED Notes (Signed)
Assisted patient to bedside commode. Pt able to stand and pivot with minimal assistance. UA collected and sent to lab.

## 2020-07-03 NOTE — Progress Notes (Signed)
Patient today with likely non-epileptic event. She is laying in bed, eyes tightly closed, head side to side, resists opening of eyes, blinks to threat bilaterally, at one time nods heads "no". When i lift her arm over her head she puposefully moves it so it does not come down and hit her in the face. When she eventually speaks her speech appears functional, no tongue biting or urination. She eventually has eyes open staring at the ceiling and stuttering slightly. This appears to be non-epileptic maybe psychogenic but will send to ED for workup and EEG. She is not sleeping at night, her mind is "racing", she needs to go to the ED and needs an eeg. Would like an EEG in the emergency room if possible.

## 2020-07-03 NOTE — ED Triage Notes (Addendum)
Pt BIBA from a neurologist office this morning. Pt old husband at the office she was going to pass out then patient passed out for approx 2 mins. Pt seen yesterday for a stroke workup. Pt has weakness and is barely able to form coherent seizures. Pt unable to lift extremities off of the bed. Resp even and unlabored.

## 2020-07-03 NOTE — ED Notes (Signed)
Pt is more alert and verbal.

## 2020-07-03 NOTE — ED Notes (Signed)
Lunch Tray Ordered @ 1701. 

## 2020-07-03 NOTE — Procedures (Addendum)
Patient Name: KELLIS TOPETE  MRN: [id number]  Epilepsy Attending: Charlsie Quest  Referring Physician/Provider: Dietrich Pates, PA Date: 07/03/2020 Duration: 24.05 mins  Patient history: 54 year old female with new onset seizure-like episodes.  EEG evaluate for seizures.  Level of alertness: Awake  AEDs during EEG study: None  Technical aspects: This EEG study was done with scalp electrodes positioned according to the 10-20 International system of electrode placement. Electrical activity was acquired at a sampling rate of 500Hz  and reviewed with a high frequency filter of 70Hz  and a low frequency filter of 1Hz . EEG data were recorded continuously and digitally stored.   Description: The posterior dominant rhythm consists of 10 Hz activity of moderate voltage (25-35 uV) seen predominantly in posterior head regions, symmetric and reactive to eye opening and eye closing.  Hyperventilation and photic stimulation were not performed.     IMPRESSION: This study is within normal limits. No seizures or epileptiform discharges were seen throughout the recording.  Shyanna Klingel 

## 2020-07-03 NOTE — Significant Event (Signed)
Blood pressure dropped, and tachycardia. Responded to IV boluses.  To examine patient, patient appears to be pale, patient also complaining about new onset of RUQ abdominal pain. Physical exam, belly soft, tenderness on RUQ without rebound or guarding.  Discussed with nursing and husband at bedside, stat CBC and type and screen. Start CT abdomen without contrast.

## 2020-07-03 NOTE — H&P (Signed)
History and Physical    Martha Moss JKK:[id number] DOB: [birth year] DOA: 07/03/2020  PCP: Midge Minium, MD (Confirm with patient/family/NH records and if not entered, this has to be entered at [facility] point of entry) Patient coming from: Home  I have personally briefly reviewed patient's old medical records in [place]  Chief Complaint: Passed out  HPI: Martha Moss is a 54 y.o. female with medical history significant of complicated migraines, fibromyalgia, GERD, presented with syncope versus seizure.  Patient has had long history of migraines since she was a teenager, has been following with neurosurgery who has been titrating her Topamax, was increased for the last couple years done slowly titrating down from last year.  Patient has been responded well and stable on until last month, when she started to feel more frequent headaches, only on the right frontal and around right eye and left backside.  3 days ago patient suddenly developed speech problems came to ED MRI negative for CVA, and patient discharged home.  This morning, patient started to feel nauseous and severe headache around the right eye and left neck, and patient had 1 episode of syncope later of the day, which she had prodrome of feeling lightheaded and blurred vision and became unresponsive in her PCPs office for about 3 to 4 minutes.  Husband reported the patient appeared to be pale, and arms and legs were flat during the episode, but no whole body shaking, stiffness or loss control of urine or bowel movement or biting her tongue.  Patient woke up a bit confused and again had some speech difficulties. ED Course: Neurology was called in and EEG is being done.  Review of Systems: As per HPI otherwise 14 point review of systems negative.    Past Medical History:  Diagnosis Date  . Depression   . Fibromyalgia   . GERD (gastroesophageal reflux disease)   . Migraine   . PONV (postoperative nausea and  vomiting)     Past Surgical History:  Procedure Laterality Date  . CHOLECYSTECTOMY    . ETHMOIDECTOMY Bilateral 09/09/2016   Procedure: ETHMOIDECTOMY;  Surgeon: Leta Baptist, MD;  Location: [place];  Service: ENT;  Laterality: Bilateral;  . FRONTAL SINUS EXPLORATION Bilateral 09/09/2016   Procedure: FRONTAL RECESS SINUS EXPLORATION;  Surgeon: Leta Baptist, MD;  Location: [place];  Service: ENT;  Laterality: Bilateral;  . MAXILLARY ANTROSTOMY Bilateral 09/09/2016   Procedure: MAXILLARY ANTROSTOMY WITH TISSUE REMOVAL;  Surgeon: Leta Baptist, MD;  Location: [place];  Service: ENT;  Laterality: Bilateral;  . OVARIAN CYST SURGERY    . SINUS ENDO W/FUSION Bilateral 09/09/2016   Procedure: ENDOSCOPIC SINUS SURGERY WITH NAVIGATION;  Surgeon: Leta Baptist, MD;  Location: [place];  Service: ENT;  Laterality: Bilateral;  . TOTAL VAGINAL HYSTERECTOMY    . TURBINATE REDUCTION Bilateral 09/09/2016   Procedure: BILATERAL TURBINATE REDUCTION;  Surgeon: Leta Baptist, MD;  Location: [place];  Service: ENT;  Laterality: Bilateral;     reports that she has never smoked. She has never used smokeless tobacco. She reports current alcohol use. She reports that she does not use drugs.  Allergies  Allergen Reactions  . Bee Venom   . Azithromycin     REACTION: hives  . Diphenhydramine Hcl     REACTION: hives  . Erythromycin     REACTION: hives  . Latex     REACTION: rash  . Morphine And Related Nausea And Vomiting  .  Penicillins     REACTION: rash, fever  . Percocet [Oxycodone-Acetaminophen] Other (See Comments)    Hallucinations   . Sulfonamide Derivatives     REACTION: rash, fever  . Tetracyclines & Related     Family History  Problem Relation Age of Onset  . Kidney Stones Daughter   . Migraines Mother   . Heart attack Father 71  . Diabetes Maternal Grandmother   . Stroke Sister   . Migraines Brother   . Stroke Brother   .  Migraines Other        many family members on maternal side      Prior to Admission medications   Medication Sig Start Date End Date Taking? Authorizing Provider  buPROPion (WELLBUTRIN XL) 300 MG 24 hr tablet TAKE 1 TABLET BY MOUTH DAILY 04/27/20  Yes Midge Minium, MD  fexofenadine (ALLEGRA) 180 MG tablet Take 180 mg by mouth daily.   Yes [provider]  levocetirizine (XYZAL) 5 MG tablet Take 5 mg by mouth every evening.   Yes [provider]  magnesium 30 MG tablet Take 30 mg by mouth every other day.   Yes [provider]  Multiple Vitamin (MULTIVITAMIN WITH MINERALS) TABS tablet Take 1 tablet by mouth daily.   Yes [provider]  pantoprazole (PROTONIX) 40 MG tablet TAKE 1 TABLET BY MOUTH DAILY 03/02/20  Yes Midge Minium, MD  sucralfate (CARAFATE) 1 g tablet Take 1 tablet (1 g total) by mouth 4 (four) times daily -  with meals and at bedtime. 10/20/19  Yes Midge Minium, MD  topiramate (TOPAMAX) 200 MG tablet TAKE 1 TABLET BY MOUTH TWICE DAILY Patient taking differently: Take 100 mg by mouth daily.  03/14/20  Yes Midge Minium, MD  vitamin E 1000 UNIT capsule Take 1,000 Units by mouth daily.   Yes [provider]  linaclotide Rolan Lipa) 72 MCG capsule Take 1 capsule (72 mcg total) by mouth daily before breakfast. Patient not taking: Reported on 07/03/2020 02/04/18   Midge Minium, MD  triamcinolone (NASACORT) 55 MCG/ACT AERO nasal inhaler Place 2 sprays into the nose daily. Patient not taking: Reported on 07/03/2020 11/30/19   Brunetta Jeans, [place]    Physical Exam: Vitals:   07/03/20 1100 07/03/20 1200 07/03/20 1215 07/03/20 1300  BP: 135/73 121/68 129/67 117/87  Pulse: 92 93 93 95  Resp: 20 15 17 19   Temp:      TempSrc:      SpO2: 98% 97% 97% 98%    Constitutional: NAD, calm, comfortable Vitals:   07/03/20 1100 07/03/20 1200 07/03/20 1215 07/03/20 1300  BP: 135/73 121/68 129/67 117/87  Pulse: 92 93 93  95  Resp: 20 15 17 19   Temp:      TempSrc:      SpO2: 98% 97% 97% 98%   Eyes: PERRL, lids and conjunctivae normal ENMT: Mucous membranes are moist. Posterior pharynx clear of any exudate or lesions.Normal dentition.  Neck: normal, supple, no masses, no thyromegaly Respiratory: clear to auscultation bilaterally, no wheezing, no crackles. Normal respiratory effort. No accessory muscle use.  Cardiovascular: Regular rate and rhythm, no murmurs / rubs / gallops. No extremity edema. 2+ pedal pulses. No carotid bruits.  Abdomen: no tenderness, no masses palpated. No hepatosplenomegaly. Bowel sounds positive.  Musculoskeletal: no clubbing / cyanosis. No joint deformity upper and lower extremities. Good ROM, no contractures. Normal muscle tone.  Skin: no rashes, lesions, ulcers. No induration Neurologic: CN 2-12 grossly intact. Sensation intact,  DTR normal. Strength 5/5 in all 4.  Psychiatric: Normal judgment and insight. Alert and oriented x 3. Normal mood.     Labs on Admission: I have personally reviewed following labs and imaging studies  CBC: Recent Labs  Lab 06/30/20 1804 06/30/20 1809 07/03/20 0916  WBC 6.1  --  6.7  NEUTROABS 1.9  --  3.9  HGB 13.3 13.9 14.5  HCT 42.5 41.0 45.4  MCV 98.2  --  97.6  PLT 262  --  330   Basic Metabolic Panel: Recent Labs  Lab 06/30/20 1804 06/30/20 1809 07/03/20 0916 07/03/20 1004  NA 143 144 142 141  K 4.1 4.1 4.3 4.3  CL 109 110 105 106  CO2 24  --  27 24  GLUCOSE 98 96 107* 101*  BUN 14 20 15 18   CREATININE 0.85 0.70 1.04* 0.83  CALCIUM 9.7  --  10.0 9.8   GFR: Estimated Creatinine Clearance: 61.3 mL/min (by C-G formula based on SCr of 0.83 mg/dL). Liver Function Tests: Recent Labs  Lab 06/30/20 1804 07/03/20 1004  AST 28 21  ALT 20 15  ALKPHOS 63 63  BILITOT 0.6 0.8  PROT 7.3 7.1  ALBUMIN 4.2 4.2   No results for input(s): LIPASE, AMYLASE in the last 168 hours. Recent Labs  Lab 07/03/20 1004  AMMONIA 26    Coagulation Profile: No results for input(s): INR, PROTIME in the last 168 hours. Cardiac Enzymes: No results for input(s): CKTOTAL, CKMB, CKMBINDEX, TROPONINI in the last 168 hours. BNP (last 3 results) No results for input(s): PROBNP in the last 8760 hours. HbA1C: No results for input(s): HGBA1C in the last 72 hours. CBG: Recent Labs  Lab 07/03/20 0907  GLUCAP 96   Lipid Profile: No results for input(s): CHOL, HDL, LDLCALC, TRIG, CHOLHDL, LDLDIRECT in the last 72 hours. Thyroid Function Tests: No results for input(s): TSH, T4TOTAL, FREET4, T3FREE, THYROIDAB in the last 72 hours. Anemia Panel: No results for input(s): VITAMINB12, FOLATE, FERRITIN, TIBC, IRON, RETICCTPCT in the last 72 hours. Urine analysis:    Component Value Date/Time   COLORURINE YELLOW 08/23/2017 1140   APPEARANCEUR CLEAR 08/23/2017 1140   LABSPEC 1.025 08/23/2017 1140   PHURINE 6.5 08/23/2017 1140   GLUCOSEU NEGATIVE 08/23/2017 1140   HGBUR NEGATIVE 08/23/2017 1140   HGBUR negative 10/26/2009 1516   BILIRUBINUR negative 08/27/2017 1127   KETONESUR NEGATIVE 08/23/2017 1140   PROTEINUR negative 08/27/2017 1127   PROTEINUR NEGATIVE 08/23/2017 1140   UROBILINOGEN 0.2 08/27/2017 1127   UROBILINOGEN 0.2 03/05/2015 1402   NITRITE negative 08/27/2017 1127   NITRITE NEGATIVE 08/23/2017 1140   LEUKOCYTESUR Negative 08/27/2017 1127    Radiological Exams on Admission: EEG  Result Date: 07/03/2020 Lora Havens, MD     07/03/2020  1:15 PM Patient Name: OANH DEVIVO MRN: [id number] Epilepsy Attending: Lora Havens Referring Physician/Provider: Delia Heady, PA Date: 03/02/2020 Duration: Patient history: 54 year old female with new onset seizure-like episodes.  EEG evaluate for seizures. Level of alertness: Awake AEDs during EEG study: None Technical aspects: This EEG study was done with scalp electrodes positioned according to the 10-20 International system of electrode placement. Electrical  activity was acquired at a sampling rate of 500Hz  and reviewed with a high frequency filter of 70Hz  and a low frequency filter of 1Hz . EEG data were recorded continuously and digitally stored. Description: The posterior dominant rhythm consists of 10 Hz activity of moderate voltage (25-35 uV) seen predominantly in posterior head regions, symmetric and reactive to eye  opening and eye closing.  Hyperventilation and photic stimulation were not performed.   IMPRESSION: This study is within normal limits. No seizures or epileptiform discharges were seen throughout the recording. Priyanka Barbra Sarks    EKG: Independently reviewed. Right axis deviation  Assessment/Plan Active Problems:   Seizure ([place])  (please populate well all problems here in Problem List. (For example, if patient is on BP meds at home and you resume or decide to hold them, it is a problem that needs to be her. Same for CAD, COPD, HLD and so on)  Syncope vs seizure -She has prodromes, appears to be at least part vasovagal reaction.  Check echo -EKG showed right axis deviation, but patient does not have any history of lung problems.  We will check an echo -Orthostatic vital signs -EEG is being done, discussed with neurologist, plan for telemetry admission for close monitoring and depends on clinical response and forced EEG finding may need continuous EEG monitoring. -Received 1 dose of Topamax in the ED, neurologist to decide further antiseizure treatment. -MRI with contrast to rule out brain mass -Check ESR.  Migraines -Neurology will have asked to titrate Topamax  Anxiety depression -Continue Wellbutrin  GERD -Sucralfate plus PPI  DVT prophylaxis: Lovenox Code Status: Full code Family Communication: Husband at bedside Disposition Plan: According to neurologist plan, will need more than 2 midnight hospital stay for close monitoring and may need 24 hours continuous EEG monitoring. Consults called: Neurology Admission status:  Telemetry admission   Lequita Halt MD [facility] Pager [phone]   07/03/2020, 2:18 PM

## 2020-07-03 NOTE — Progress Notes (Signed)
Patient disconnected from EEG for CT and MRI

## 2020-07-03 NOTE — ED Provider Notes (Signed)
[facility] EMERGENCY DEPARTMENT Provider Note   CSN: [id number] Arrival date & time: 07/03/20  5784     History Chief Complaint  Patient presents with  . Weakness    Martha Moss is a 54 y.o. female with a past medical history of migraines, fibromyalgia, depression presenting to the ED for generalized weakness.  History is provided by patient and husband at the bedside.  Around 06/29/2020 patient started feeling "foggy headed, not like myself, like I was out of my body."  1 week prior to this she began taking Ambien to help her sleep.  She discontinued the Ambien after the symptoms began.  The next day began having slurred speech and came to the ER for strokelike symptoms.  She had negative MRI and lab work at the time and was told to follow-up with neurology after feeling better with a migraine cocktail.  Husband states that over the weekend she was not back to baseline and still felt generally weak, complaining of a headache and had trouble speaking.  While at the neurologist office today she had an episode that husband describes that "passing out."  From neurologist note today:  Patient today with likely non-epileptic event. She is laying in bed, eyes tightly closed, head side to side, resists opening of eyes, blinks to threat bilaterally, at one time nods heads "no". When i lift her arm over her head she puposefully moves it so it does not come down and hit her in the face. When she eventually speaks her speech appears functional, no tongue biting or urination. She eventually has eyes open staring at the ceiling and stuttering slightly.  Patient states that she "feels like a vegetable."  They were told that it could be due to stress.  She denies any recent increase stressors but husband states that she was scheduled to go back to work at the beginning of next year.  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  Patient denies any head injuries or falls.  No fever or neck stiffness.  HPI     Past Medical History:  Diagnosis Date  . Depression   . Fibromyalgia   . GERD (gastroesophageal reflux disease)   . Migraine   . PONV (postoperative nausea and vomiting)     Patient Active Problem List   Diagnosis Date Noted  . Seizure-like activity (HCC) 07/03/2020  . Psychogenic nonepileptic seizure 07/03/2020  . Neuropathy of both feet 12/22/2017  . Physical exam 09/25/2016  . Chronic idiopathic constipation 10/13/2014  . Contact dermatitis due to jewelry 02/19/2014  . Acute sinusitis 11/19/2013  . Insomnia 06/22/2013  . Pain in right wrist 06/22/2013  . Soft tissue mass 06/22/2013  . Plantar fasciitis of left foot 03/14/2013  . Shaky 02/22/2013  . Depression with anxiety 10/28/2010  . Migraine without aura 10/28/2010  . GERD 10/28/2010  . CHEST PAIN, ATYPICAL, HX OF 03/27/2010  . ALLERGIC RHINITIS, SEASONAL 12/07/2009  . Fibromyalgia 10/26/2009  . POLYARTHRITIS 10/19/2009    Past Surgical History:  Procedure Laterality Date  . CHOLECYSTECTOMY    . ETHMOIDECTOMY Bilateral 09/09/2016   Procedure: ETHMOIDECTOMY;  Surgeon: Newman Pies, MD;  Location: [facility];  Service: ENT;  Laterality: Bilateral;  . FRONTAL SINUS EXPLORATION Bilateral 09/09/2016   Procedure: FRONTAL RECESS SINUS EXPLORATION;  Surgeon: Newman Pies, MD;  Location: [facility];  Service: ENT;  Laterality:  Bilateral;  . MAXILLARY ANTROSTOMY Bilateral 09/09/2016   Procedure: MAXILLARY ANTROSTOMY WITH TISSUE REMOVAL;  Surgeon: Newman PiesSu Teoh, MD;  Location: [facility];  Service: ENT;  Laterality: Bilateral;  . OVARIAN CYST SURGERY    . SINUS ENDO W/FUSION Bilateral 09/09/2016   Procedure: ENDOSCOPIC SINUS SURGERY WITH NAVIGATION;  Surgeon: Newman PiesSu Teoh, MD;  Location: [facility];  Service: ENT;  Laterality: Bilateral;  . TOTAL VAGINAL HYSTERECTOMY    . TURBINATE REDUCTION  Bilateral 09/09/2016   Procedure: BILATERAL TURBINATE REDUCTION;  Surgeon: Newman PiesSu Teoh, MD;  Location: [facility];  Service: ENT;  Laterality: Bilateral;     OB History    Gravida  3   Para  3   Term  3   Preterm      AB      Living  3     SAB      TAB      Ectopic      Multiple      Live Births  3           Family History  Problem Relation Age of Onset  . Kidney Stones Daughter   . Migraines Mother   . Heart attack Father [id number]  . Diabetes Maternal Grandmother   . Stroke Sister   . Migraines Brother   . Stroke Brother   . Migraines Other        many family members on maternal side     Social History   Tobacco Use  . Smoking status: Never Smoker  . Smokeless tobacco: Never Used  Vaping Use  . Vaping Use: Never used  Substance Use Topics  . Alcohol use: Yes    Comment: rarely  . Drug use: No    Home Medications Prior to Admission medications   Medication Sig Start Date End Date Taking? Authorizing Provider  buPROPion (WELLBUTRIN XL) 300 MG 24 hr tablet TAKE 1 TABLET BY MOUTH DAILY 04/27/20  Yes Sheliah Hatchabori, Katherine E, MD  fexofenadine (ALLEGRA) 180 MG tablet Take 180 mg by mouth daily.   Yes [provider]  levocetirizine (XYZAL) 5 MG tablet Take 5 mg by mouth every evening.   Yes [provider]  magnesium 30 MG tablet Take 30 mg by mouth every other day.   Yes [provider]  Multiple Vitamin (MULTIVITAMIN WITH MINERALS) TABS tablet Take 1 tablet by mouth daily.   Yes [provider]  pantoprazole (PROTONIX) 40 MG tablet TAKE 1 TABLET BY MOUTH DAILY 03/02/20  Yes Sheliah Hatchabori, Katherine E, MD  sucralfate (CARAFATE) 1 g tablet Take 1 tablet (1 g total) by mouth 4 (four) times daily -  with meals and at bedtime. 10/20/19  Yes Sheliah Hatchabori, Katherine E, MD  topiramate (TOPAMAX) 200 MG tablet TAKE 1 TABLET BY MOUTH TWICE DAILY Patient taking differently: Take 100 mg by mouth daily.  03/14/20  Yes Sheliah Hatchabori, Katherine E, MD    vitamin E 1000 UNIT capsule Take 1,000 Units by mouth daily.   Yes [provider]  linaclotide Karlene Einstein(LINZESS) 72 MCG capsule Take 1 capsule (72 mcg total) by mouth daily before breakfast. Patient not taking: Reported on 07/03/2020 02/04/18   Sheliah Hatchabori, Katherine E, MD  triamcinolone (NASACORT) 55 MCG/ACT AERO nasal inhaler Place 2 sprays into the nose daily. Patient not taking: Reported on 07/03/2020 11/30/19   Waldon MerlMartin, William C, PA-C    Allergies    Bee venom, Azithromycin, Diphenhydramine hcl, Erythromycin, Latex, Morphine and related, Penicillins, Percocet [oxycodone-acetaminophen], Sulfonamide  derivatives, and Tetracyclines & related  Review of Systems   Review of Systems  Constitutional: Positive for fatigue. Negative for appetite change, chills and fever.  HENT: Negative for ear pain, rhinorrhea, sneezing and sore throat.   Eyes: Negative for photophobia and visual disturbance.  Respiratory: Negative for cough, chest tightness, shortness of breath and wheezing.   Cardiovascular: Negative for chest pain and palpitations.  Gastrointestinal: Negative for abdominal pain, blood in stool, constipation, diarrhea, nausea and vomiting.  Genitourinary: Negative for dysuria, hematuria and urgency.  Musculoskeletal: Negative for myalgias.  Skin: Negative for rash.  Neurological: Positive for speech difficulty, weakness and headaches. Negative for dizziness and light-headedness.  Psychiatric/Behavioral: Positive for confusion and sleep disturbance.    Physical Exam Updated Vital Signs BP 121/68   Pulse 93   Temp 97.9 F (36.6 C) (Oral)   Resp 15   SpO2 97%   Physical Exam Vitals and nursing note reviewed.  Constitutional:      General: She is not in acute distress.    Appearance: She is well-developed.  HENT:     Head: Normocephalic and atraumatic.     Nose: Nose normal.  Eyes:     General: No scleral icterus.       Right eye: No discharge.        Left eye: No discharge.      Conjunctiva/sclera: Conjunctivae normal.     Pupils: Pupils are equal, round, and reactive to light.  Cardiovascular:     Rate and Rhythm: Normal rate and regular rhythm.     Heart sounds: Normal heart sounds. No murmur heard.  No friction rub. No gallop.   Pulmonary:     Effort: Pulmonary effort is normal. No respiratory distress.     Breath sounds: Normal breath sounds.  Abdominal:     General: Bowel sounds are normal. There is no distension.     Palpations: Abdomen is soft.     Tenderness: There is no abdominal tenderness. There is no guarding.  Musculoskeletal:        General: Normal range of motion.     Cervical back: Normal range of motion and neck supple.  Skin:    General: Skin is warm and dry.     Findings: No rash.  Neurological:     Mental Status: She is alert.     Motor: No abnormal muscle tone.     Coordination: Coordination normal.     Comments: Patient alert and oriented to self, place and situation.  Unwilling to participate in exam to elicit strength.  States that she is able to feel my hands but states that "it just feels different all over my body."  No facial asymmetry noted.  Patient with stuttering speech and slow to answer.     ED Results / Procedures / Treatments   Labs (all labs ordered are listed, but only abnormal results are displayed) Labs Reviewed  BASIC METABOLIC PANEL - Abnormal; Notable for the following components:      Result Value   Glucose, Bld 107 (*)    Creatinine, Ser 1.04 (*)    All other components within normal limits  COMPREHENSIVE METABOLIC PANEL - Abnormal; Notable for the following components:   Glucose, Bld 101 (*)    All other components within normal limits  RESPIRATORY PANEL BY RT PCR (FLU A&B, COVID)  CBC WITH DIFFERENTIAL/PLATELET  RAPID URINE DRUG SCREEN, HOSP PERFORMED  ETHANOL  AMMONIA  CBG MONITORING, ED    EKG None  Radiology  No results found.  Procedures Procedures (including critical care  time)  Medications Ordered in ED Medications - No data to display  ED Course  I have reviewed the triage vital signs and the nursing notes.  Pertinent labs & imaging results that were available during my care of the patient were reviewed by me and considered in my medical decision making (see chart for details).  Clinical Course as of Jul 03 1248  Tue Jul 03, 2020  6720 Spoke to on-call epileptologist Dr. Melynda Ripple who reviewed patient's chart and recent work-up.  She recommends EEG but may need admission for a longer EEG.  She will see the patient in consult and asked that we admit to medicine service.   [HK]  0943 Ammonia: 26 [HK]    Clinical Course User Index [HK] Dietrich Pates, PA-C   MDM Rules/Calculators/A&P                          54 year old female with a past medical history of migraines, fibromyalgia and depression presenting to the ED for generalized weakness.  4 days ago started feeling like she was foggy headed and out of her body.  1 week prior to this she began taking the Ambien to help her sleep.  Discontinued this after the symptoms began.  Was evaluated the ER on the 20th for slurred speech with unremarkable MRI and lab work.  She followed up with neurology this morning had an episode where she was laying in bed with eyes tightly closed and not willing to respond.  She was sent to the ER for EEG.  During my evaluation patient is not particularly participating in exam.  She is alert and oriented but slow to speak and has stuttering speech.  No obvious neuro deficits noted.  Spoke to on-call epileptologist who recommends EEG, MRI with contrast and admission to medicine service.  She plans for long-term EEG and will reconsider further testing such as an LP in 2 days.  Her lab work here including additional labs such as ammonia, ethanol and UDS are unremarkable.  She will be admitted for further management of this new onset altered mental status.    Portions of this note were  generated with Scientist, clinical (histocompatibility and immunogenetics). Dictation errors may occur despite best attempts at proofreading.  Final Clinical Impression(s) / ED Diagnoses Final diagnoses:  Altered mental status, unspecified altered mental status type    Rx / DC Orders ED Discharge Orders    None       Dietrich Pates, PA-C 07/03/20 1249    Arby Barrette, MD 07/03/20 1342

## 2020-07-03 NOTE — ED Notes (Signed)
Paged by patient due to patient feeling faint. Pt awake and alert and in NAD. Continuous EEG in place. VSS. Resp even and unlabored.

## 2020-07-03 NOTE — Progress Notes (Signed)
EEG complete - results pending 

## 2020-07-03 NOTE — Procedures (Signed)
Echo attempted. Unable to attempt echo due to EEG test in progress. Will attempt again once EEG complete.

## 2020-07-03 NOTE — Progress Notes (Addendum)
Repeat CBC showed hemoglobin 13.4  Went back to examine the patient, abdominal pain improved, blood pressure 108/79.  Patient appears to respond to IV fluids.  Continue normal saline 125 mL/h.  Ordered lactic acid, CT abdomen pending.   18:38. CT abd result reviewed. D/W pt's husband who reported that pt has had frequent epigastric pain for about 2 months, usually after meals, but no night pain. Informed  GI to see pt tomorrow. NPO for now as pt still having abd pain, PPI IV BID.

## 2020-07-03 NOTE — Consult Note (Addendum)
Neurology Consultation Reason for Consult: Seizure-like activity, confusion Referring Physician: Dr. Arby Barrette  CC: Seizure-like episode  History is obtained from: Patient, chart review  HPI: Martha Moss is a 54 y.o. female with past medical history of migraines, insomnia, interstitial cystitis who presented with speech disturbance and confusion for almost a week.  Patient states she has been feeling confused since about a week now.  States her husband came back from work on Friday morning and she told her husband that she is not feeling well, had speech disturbance.  She came to [facility], ED on 06/30/2020 and had an MRI brain without contrast which did not show any abnormality.  She was at [facility] today to see Dr. Lucia Gaskins and had an episode where she briefly lost consciousness per husband.  Per Dr. Trevor Mace note, she was lying in bed, eyes tightly closed, head side to side, resisted opening of eyes, blinks to threat bilaterally and at 1 time nodded her head no.  When Dr. Lucia Gaskins lifted her arm overhead she purposefully moved it, it did not come down and hit her face.  She eventually started speaking, no tongue biting or urination.  She was also noted to be staring at the ceiling, stuttering slightly.  She was sent to ED for work-up.  Of note, patient reports that she started taking Ambien on 06/21/2020.  Patient denies any prior history of epilepsy/seizures, similar episodes in the past.  She also reports that she has had migraine which have been very well controlled on Topamax.  However for the last few days she reports having occipital left more than right tingling headache.  Also she reports that she does not think her peripheral vision is good.  Lastly, she also reports abdominal pain which has been going on for about a month.  She talked to her PCP about it and was told that it could be related to constipation or reflux.   ROS: All other systems reviewed and  negative except as noted in the HPI.   Past Medical History:  Diagnosis Date  . Depression   . Fibromyalgia   . GERD (gastroesophageal reflux disease)   . Migraine   . PONV (postoperative nausea and vomiting)     Family History  Problem Relation Age of Onset  . Kidney Stones Daughter   . Migraines Mother   . Heart attack Father 61  . Diabetes Maternal Grandmother   . Stroke Sister   . Migraines Brother   . Stroke Brother   . Migraines Other        many family members on maternal side     Social History:  reports that she has never smoked. She has never used smokeless tobacco. She reports current alcohol use. She reports that she does not use drugs.   Exam: Current vital signs: BP 118/71   Pulse 89   Temp 97.9 F (36.6 C) (Oral)   Resp 16   SpO2 97%  Vital signs in last 24 hours: Temp:  [97.9 F (36.6 C)] 97.9 F (36.6 C) (11/23 0904) Pulse Rate:  [89-91] 89 (11/23 1000) Resp:  [14-16] 16 (11/23 1000) BP: (118-157)/(71-85) 118/71 (11/23 1000) SpO2:  [97 %-100 %] 97 % (11/23 1000) Weight:  [55 kg] 55 kg (11/23 0755)   Physical Exam  Constitutional: Appears well-developed and well-nourished.  Psych: Affect appropriate to situation Eyes: No scleral injection HENT: No OP obstrucion Head: Normocephalic.  Cardiovascular: Normal rate and regular rhythm.  Respiratory: Effort normal,  non-labored breathing GI: Soft.  No distension. There is no tenderness.  Skin: Warm  Neuro: AOx3, cranial nerves 2- 12 grossly intact, stuttering speech, no evidence of aphasia, unable to spell world backwards, unable to do serial 7 subtractions, only able to remember 2/5 words in few minutes.,  5/5 in all 4 extremities, sensory grossly intact, FTN intact  I have reviewed labs in epic and the results pertinent to this consultation are: Normal CBC, CMP except slightly elevated glucose, normal ammonia, urine drug screen negative.  I have reviewed the images obtained: MRI brain without  contrast 06/30/2010: No evidence of recent infarction, hemorrhage or mass.  ASSESSMENT/PLAN: 54 year old female with new onset alteration of awareness, speech disturbance.  Alteration of awareness Speech disturbance Headache Abdominal pain Hyperglycemia - Routine EEG did not any ictal/interictal abnormality -Differentials include migraine with aura versus nonepileptic spells vs due to Ambien versus less likely seizures   Recommendations: -We will obtain MRI brain with contrast to look for any acute abnormality -We will obtain video EEG for 48 hours to look for any ictal/interictal abnormality -We will load patient with IV VPA, IV Toradol and IV Compazine for suspected migraine with aura -We will defer management of abdominal pain, hyperglycemia to primary team -We will hold off topiramate tomorrow morning as it can occasionally worsen word finding difficulties -Recommend holding off Ambien as patient symptoms started after Remus Loffler -We will consider lumbar puncture if patient symptoms persist/worsen by Thursday. -Seizure precautions  Thank you for allowing [place] to participate in the care of this patient.  Neurology will follow.  Please contact us for any further questions  Lindie Spruce Epilepsy [facility]

## 2020-07-03 NOTE — ED Notes (Signed)
Correction:Dinner Tray Ordered @ 1701. 

## 2020-07-03 NOTE — ED Notes (Addendum)
Pt's BP dropped to 50's. Pt was less responsive. Chipper Herb, MD notified and quickly showed up to bedside. VRBO Bolus NS hung. Pt responding well to fluids. Current BP 130/67.

## 2020-07-03 NOTE — ED Notes (Signed)
Patient transported to MRI 

## 2020-07-03 NOTE — Progress Notes (Signed)
LTM EEG hooked up and running - no initial skin breakdown - push button tested - neuro notified.  

## 2020-07-03 NOTE — Progress Notes (Signed)
vLTM started   [facility] to monitoring  Event button tested  Neurology notified

## 2020-07-03 NOTE — ED Notes (Signed)
Pt still in MRI. Unable to reassess vitals at this time.

## 2020-07-03 NOTE — Progress Notes (Signed)
Spoke w RN - no time for MRI scheduled at this time- she will call me when she has a time. We will coordinate taking the electrodes off at this time.

## 2020-07-04 ENCOUNTER — Encounter (HOSPITAL_COMMUNITY): Payer: Self-pay | Admitting: Internal Medicine

## 2020-07-04 ENCOUNTER — Inpatient Hospital Stay (HOSPITAL_COMMUNITY): Payer: Self-pay

## 2020-07-04 ENCOUNTER — Telehealth: Payer: Self-pay | Admitting: Neurology

## 2020-07-04 DIAGNOSIS — E861 Hypovolemia: Secondary | ICD-10-CM

## 2020-07-04 DIAGNOSIS — R55 Syncope and collapse: Secondary | ICD-10-CM

## 2020-07-04 DIAGNOSIS — R4182 Altered mental status, unspecified: Secondary | ICD-10-CM

## 2020-07-04 DIAGNOSIS — R1011 Right upper quadrant pain: Secondary | ICD-10-CM

## 2020-07-04 DIAGNOSIS — I9589 Other hypotension: Secondary | ICD-10-CM

## 2020-07-04 DIAGNOSIS — R1319 Other dysphagia: Secondary | ICD-10-CM

## 2020-07-04 DIAGNOSIS — R110 Nausea: Secondary | ICD-10-CM

## 2020-07-04 LAB — CBC WITH DIFFERENTIAL/PLATELET
Abs Immature Granulocytes: 0.01 10*3/uL (ref 0.00–0.07)
Basophils Absolute: 0 10*3/uL (ref 0.0–0.1)
Basophils Relative: 1 %
Eosinophils Absolute: 0 10*3/uL (ref 0.0–0.5)
Eosinophils Relative: 0 %
HCT: 36.8 % (ref 36.0–46.0)
Hemoglobin: 12 g/dL (ref 12.0–15.0)
Immature Granulocytes: 0 %
Lymphocytes Relative: 30 %
Lymphs Abs: 2.2 10*3/uL (ref 0.7–4.0)
MCH: 31.6 pg (ref 26.0–34.0)
MCHC: 32.6 g/dL (ref 30.0–36.0)
MCV: 96.8 fL (ref 80.0–100.0)
Monocytes Absolute: 0.6 10*3/uL (ref 0.1–1.0)
Monocytes Relative: 8 %
Neutro Abs: 4.6 10*3/uL (ref 1.7–7.7)
Neutrophils Relative %: 61 %
Platelets: 262 10*3/uL (ref 150–400)
RBC: 3.8 MIL/uL — ABNORMAL LOW (ref 3.87–5.11)
RDW: 12.7 % (ref 11.5–15.5)
WBC: 7.5 10*3/uL (ref 4.0–10.5)
nRBC: 0 % (ref 0.0–0.2)

## 2020-07-04 LAB — GLUCOSE, CAPILLARY
Glucose-Capillary: 105 mg/dL — ABNORMAL HIGH (ref 70–99)
Glucose-Capillary: 108 mg/dL — ABNORMAL HIGH (ref 70–99)
Glucose-Capillary: 136 mg/dL — ABNORMAL HIGH (ref 70–99)
Glucose-Capillary: 92 mg/dL (ref 70–99)

## 2020-07-04 LAB — COMPREHENSIVE METABOLIC PANEL
ALT: 14 U/L (ref 0–44)
AST: 17 U/L (ref 15–41)
Albumin: 3.6 g/dL (ref 3.5–5.0)
Alkaline Phosphatase: 52 U/L (ref 38–126)
Anion gap: 10 (ref 5–15)
BUN: 14 mg/dL (ref 6–20)
CO2: 19 mmol/L — ABNORMAL LOW (ref 22–32)
Calcium: 9 mg/dL (ref 8.9–10.3)
Chloride: 113 mmol/L — ABNORMAL HIGH (ref 98–111)
Creatinine, Ser: 0.87 mg/dL (ref 0.44–1.00)
GFR, Estimated: >60 mL/min (ref >60)
Glucose, Bld: 101 mg/dL — ABNORMAL HIGH (ref 70–99)
Potassium: 4 mmol/L (ref 3.5–5.1)
Sodium: 142 mmol/L (ref 135–145)
Total Bilirubin: 0.7 mg/dL (ref 0.3–1.2)
Total Protein: 6 g/dL — ABNORMAL LOW (ref 6.5–8.1)

## 2020-07-04 LAB — ECHOCARDIOGRAM COMPLETE
Area-P 1/2: 3.48 cm2
Height: 62 in
S' Lateral: 2.6 cm
Weight: 1940.05 oz

## 2020-07-04 LAB — LACTIC ACID, PLASMA: Lactic Acid, Venous: 2.2 mmol/L (ref 0.5–1.9)

## 2020-07-04 LAB — CORTISOL: Cortisol, Plasma: 24 ug/dL

## 2020-07-04 LAB — TSH: TSH: 0.913 u[IU]/mL (ref 0.350–4.500)

## 2020-07-04 MED ORDER — DEXTROSE-NACL 5-0.45 % IV SOLN: INTRAVENOUS | Status: AC

## 2020-07-04 MED ORDER — VITAMIN E 45 MG (100 UNIT) PO CAPS
200.0000 [IU] | ORAL_CAPSULE | Freq: Every day | ORAL | Status: DC
  Administered 2020-07-04 – 2020-07-06 (×3): 200 [IU] via ORAL
  Filled 2020-07-04 (×3): qty 2

## 2020-07-04 MED ORDER — SENNOSIDES-DOCUSATE SODIUM 8.6-50 MG PO TABS: 1.0000 | ORAL_TABLET | Freq: Every evening | ORAL | Status: DC | PRN

## 2020-07-04 MED ORDER — VITAMIN E 180 MG (400 UNIT) PO CAPS
800.0000 [IU] | ORAL_CAPSULE | Freq: Every day | ORAL | Status: DC
  Administered 2020-07-04 – 2020-07-06 (×3): 800 [IU] via ORAL
  Filled 2020-07-04 (×3): qty 2

## 2020-07-04 NOTE — Procedures (Signed)
Patient Name: Martha Moss  MRN: [id number]  Epilepsy Attending: Charlsie Quest  Referring Physician/Provider: Dr Lindie Spruce Duration: 07/03/2020 1110 to 07/04/2020 1110  Patient history: 54 year old female with new onset seizure-like episodes.  EEG evaluate for seizures.  Level of alertness: Awake, asleep  AEDs during EEG study: None  Technical aspects: This EEG study was done with scalp electrodes positioned according to the 10-20 International system of electrode placement. Electrical activity was acquired at a sampling rate of 500Hz  and reviewed with a high frequency filter of 70Hz  and a low frequency filter of 1Hz . EEG data were recorded continuously and digitally stored.   Description: The posterior dominant rhythm consists of 10 Hz activity of moderate voltage (25-35 uV) seen predominantly in posterior head regions, symmetric and reactive to eye opening and eye closing.  Sleep was characterized by vertex waves, sleep spindles (12 to 14 Hz), maximal frontocentral region.  Hyperventilation and photic stimulation were not performed.  Patient had an episode on 07/03/2020 at 1532. Patient was noted to be laying in bed, appeared calm, looking at people but nonverbal.  After couple of minutes, she reported for feeling hot. Concomitant EEG before, during and after the event showed normal posterior dominant rhythm.  IMPRESSION: This study is within normal limits. No seizures or epileptiform discharges were seen throughout the recording.  One event was recorded on 07/03/2020 at 1532 during which patient was noted to be non verbal.  Concomitant EEG before, during and after the event did not show any EEG changes of the seizure.  This was a NON- epileptic event.  [name] 

## 2020-07-04 NOTE — Telephone Encounter (Signed)
Martha Moss: Patient had an event while on EEG. The EEG before, during and after was normal. These events are not seizures, they are psychogenic/non-epileptic. We do not treat this disorder patient needs to talk to a therapist to get to the bottom of what is causing her to have the episodes(ie stress, anxiety,depression). Please explain this is something we cannot address in neurology so a follow up appointment here is not needed. She likely needs therapist thanks (CCing her inpatient attending doctor so he can talk to patient as well about this).

## 2020-07-04 NOTE — Progress Notes (Signed)
PROGRESS NOTE    Martha Moss  XVQ:[id number] DOB: 1966/04/03 DOA: 07/03/2020 PCP: Sheliah Hatch, MD   Brief Narrative:  54 year old with history of chronic migraine, fibromyalgia, GERD admitted for concerns of syncope versus seizures.  Recently she has been having speech difficulty for which he presented to the ER few days prior to admission and her MRI brain was negative and discharged home.  After going home continued to have headache and nausea also became unresponsive at her PCPs office therefore sent to the ER.  Neurology team consulted   Assessment & Plan:   Active Problems:   Seizure (HCC)   Hypotension  Syncope with concern for possible seizures Atypical migraine -Admission EKG-unremarkable.  Neurology abnormal -MRI brain without contrast-negative -Echocardiogram -TSH and cortisol-normal -Alcohol, UDS-negative -Questionable prolonged QTC on admission but repeat EKG 11/24-normal QTC. -Repeat EKG  Epigastric abdominal pain with nausea -GI consulted.  May eventually need endoscopy/colonoscopy -PPI IV twice daily -CT abdomen pelvis without contrast-negative for acute pathology  Lactic acidosis -Normal saline 125 cc/hr  History of anxiety/depression -Wellbutrin 300 mg daily  GERD -PPI    DVT prophylaxis: enoxaparin (LOVENOX) injection 40 mg Start: 07/03/20 1500  Code Status: Full Family Communication: Husband at bedside  Status is: Inpatient  Remains inpatient appropriate because:Hemodynamically unstable   Dispo: The patient is from: Home              Anticipated d/c is to: Home              Anticipated d/c date is: 2 days              Patient currently is not medically stable to d/c.  Still undergoing work-up for syncope versus seizure.  Unsafe for discharge    Body mass index is 22.18 kg/m.     Subjective: Patient is still having some speech issues/dysarthria this morning but overall no other complaints.  Tells me she has never  experienced similar symptoms in the past  Review of Systems Otherwise negative except as per HPI, including: General: Denies fever, chills, night sweats or unintended weight loss. Resp: Denies cough, wheezing, shortness of breath. Cardiac: Denies chest pain, palpitations, orthopnea, paroxysmal nocturnal dyspnea. GI: Denies abdominal pain, nausea, vomiting, diarrhea or constipation GU: Denies dysuria, frequency, hesitancy or incontinence MS: Denies muscle aches, joint pain or swelling Neuro: Denies headache, neurologic deficits (focal weakness, numbness, tingling), abnormal gait Psych: Denies anxiety, depression, SI/HI/AVH Skin: Denies new rashes or lesions ID: Denies sick contacts, exotic exposures, travel  Examination:  General exam: Appears calm and comfortable, EEG leads in place Respiratory system: Clear to auscultation. Respiratory effort normal. Cardiovascular system: S1 & S2 heard, RRR. No JVD, murmurs, rubs, gallops or clicks. No pedal edema. Gastrointestinal system: Abdomen is nondistended, soft and nontender. No organomegaly or masses felt. Normal bowel sounds heard. Central nervous system: Alert and oriented.  Mild dysarthric speech otherwise no other focal neuro deficits Extremities: Symmetric 5 x 5 power. Skin: No rashes, lesions or ulcers Psychiatry: Judgement and insight appear normal. Mood & affect appropriate.     Objective: Vitals:   07/03/20 1715 07/03/20 2030 07/03/20 2350 07/04/20 0803  BP: 131/77 113/64 (!) 120/59 (!) 101/53  Pulse: (!) 106 93 82 90  Resp: 17 18 18 16   Temp:  98.5 F (36.9 C) 97.8 F (36.6 C) 98.3 F (36.8 C)  TempSrc:  Oral Oral Oral  SpO2: 98% 100% 98% 100%  Weight:  55 kg    Height:  5\' 2"  (1.575  m)      Intake/Output Summary (Last 24 hours) at 07/04/2020 0826 Last data filed at 07/04/2020 0657 Gross per 24 hour  Intake 717 ml  Output 300 ml  Net 417 ml   Filed Weights   07/03/20 2030  Weight: 55 kg     Data Reviewed:     CBC: Recent Labs  Lab 06/30/20 1804 06/30/20 1809 07/03/20 0916 07/03/20 1542 07/04/20 0014  WBC 6.1  --  6.7 8.6 7.5  NEUTROABS 1.9  --  3.9 4.2 4.6  HGB 13.3 13.9 14.5 13.4 12.0  HCT 42.5 41.0 45.4 41.8 36.8  MCV 98.2  --  97.6 97.4 96.8  PLT 262  --  325 310 262   Basic Metabolic Panel: Recent Labs  Lab 06/30/20 1804 06/30/20 1809 07/03/20 0916 07/03/20 1004 07/04/20 0014  NA 143 144 142 141 142  K 4.1 4.1 4.3 4.3 4.0  CL 109 110 105 106 113*  CO2 24  --  27 24 19*  GLUCOSE 98 96 107* 101* 101*  BUN 14 20 15 18 14   CREATININE 0.85 0.70 1.04* 0.83 0.87  CALCIUM 9.7  --  10.0 9.8 9.0   GFR: Estimated Creatinine Clearance: 58.5 mL/min (by C-G formula based on SCr of 0.87 mg/dL). Liver Function Tests: Recent Labs  Lab 06/30/20 1804 07/03/20 1004 07/04/20 0014  AST 28 21 17   ALT 20 15 14   ALKPHOS 63 63 52  BILITOT 0.6 0.8 0.7  PROT 7.3 7.1 6.0*  ALBUMIN 4.2 4.2 3.6   No results for input(s): LIPASE, AMYLASE in the last 168 hours. Recent Labs  Lab 07/03/20 1004  AMMONIA 26   Coagulation Profile: No results for input(s): INR, PROTIME in the last 168 hours. Cardiac Enzymes: No results for input(s): CKTOTAL, CKMB, CKMBINDEX, TROPONINI in the last 168 hours. BNP (last 3 results) No results for input(s): PROBNP in the last 8760 hours. HbA1C: Recent Labs    07/03/20 1942  HGBA1C 5.1   CBG: Recent Labs  Lab 07/03/20 0907 07/04/20 0013  GLUCAP 96 92   Lipid Profile: No results for input(s): CHOL, HDL, LDLCALC, TRIG, CHOLHDL, LDLDIRECT in the last 72 hours. Thyroid Function Tests: No results for input(s): TSH, T4TOTAL, FREET4, T3FREE, THYROIDAB in the last 72 hours. Anemia Panel: No results for input(s): VITAMINB12, FOLATE, FERRITIN, TIBC, IRON, RETICCTPCT in the last 72 hours. Sepsis Labs: Recent Labs  Lab 07/03/20 1942 07/04/20 0014  LATICACIDVEN 3.2* 2.2*    Recent Results (from the past 240 hour(s))  Respiratory Panel by RT PCR (Flu  A&B, Covid) - Nasopharyngeal Swab     Status: None   Collection Time: 07/03/20 10:02 AM   Specimen: Nasopharyngeal Swab; Nasopharyngeal(NP) swabs in vial transport medium  Result Value Ref Range Status   SARS Coronavirus 2 by RT PCR NEGATIVE NEGATIVE Final    Comment: (NOTE) SARS-CoV-2 target nucleic acids are NOT DETECTED.  The SARS-CoV-2 RNA is generally detectable in upper respiratoy specimens during the acute phase of infection. The lowest concentration of SARS-CoV-2 viral copies this assay can detect is 131 copies/mL. A negative result does not preclude SARS-Cov-2 infection and should not be used as the sole basis for treatment or other patient management decisions. A negative result may occur with  improper specimen collection/handling, submission of specimen other than nasopharyngeal swab, presence of viral mutation(s) within the areas targeted by this assay, and inadequate number of viral copies (<131 copies/mL). A negative result must be combined with clinical observations, patient history, and  epidemiological information. The expected result is Negative.  Fact Sheet for Patients:  https://www.moore.com/https://www.fda.gov/media/142436/download  Fact Sheet for Healthcare Providers:  https://www.young.biz/https://www.fda.gov/media/142435/download  This test is no t yet approved or cleared by the [place] FDA and  has been authorized for detection and/or diagnosis of SARS-CoV-2 by FDA under an Emergency Use Authorization (EUA). This EUA will remain  in effect (meaning this test can be used) for the duration of the COVID-19 declaration under Section 564(b)(1) of the Act, 21 U.S.C. section 360bbb-3(b)(1), unless the authorization is terminated or revoked sooner.     Influenza A by PCR NEGATIVE NEGATIVE Final   Influenza B by PCR NEGATIVE NEGATIVE Final    Comment: (NOTE) The Xpert Xpress SARS-CoV-2/FLU/RSV assay is intended as an aid in  the diagnosis of influenza from Nasopharyngeal swab specimens and    should not be used as a sole basis for treatment. Nasal washings and  aspirates are unacceptable for Xpert Xpress SARS-CoV-2/FLU/RSV  testing.  Fact Sheet for Patients: https://www.moore.com/https://www.fda.gov/media/142436/download  Fact Sheet for Healthcare Providers: https://www.young.biz/https://www.fda.gov/media/142435/download  This test is not yet approved or cleared by the [place] FDA and  has been authorized for detection and/or diagnosis of SARS-CoV-2 by  FDA under an Emergency Use Authorization (EUA). This EUA will remain  in effect (meaning this test can be used) for the duration of the  Covid-19 declaration under Section 564(b)(1) of the Act, 21  U.S.C. section 360bbb-3(b)(1), unless the authorization is  terminated or revoked. Performed at [facility], [address] [place], [place] [address]          Radiology Studies: EEG  Result Date: 07/03/2020 Charlsie QuestYadav, Priyanka O, MD     07/03/2020  1:15 PM Patient Name: Martha Moss MRN: [id number] Epilepsy Attending: Charlsie QuestPriyanka O Yadav Referring Physician/Provider: Dietrich PatesHina Khatri, PA Date: 03/02/2020 Duration: Patient history: 54 year old female with new onset seizure-like episodes.  EEG evaluate for seizures. Level of alertness: Awake AEDs during EEG study: None Technical aspects: This EEG study was done with scalp electrodes positioned according to the 10-20 International system of electrode placement. Electrical activity was acquired at a sampling rate of 500Hz  and reviewed with a high frequency filter of 70Hz  and a low frequency filter of 1Hz . EEG data were recorded continuously and digitally stored. Description: The posterior dominant rhythm consists of 10 Hz activity of moderate voltage (25-35 uV) seen predominantly in posterior head regions, symmetric and reactive to eye opening and eye closing.  Hyperventilation and photic stimulation were not performed.   IMPRESSION: This study is within normal limits. No seizures or epileptiform discharges were seen  throughout the recording. Priyanka Annabelle Harman Yadav   CT ABDOMEN PELVIS WO CONTRAST  Result Date: 07/03/2020 CLINICAL DATA:  Abdominal pain. EXAM: CT ABDOMEN AND PELVIS WITHOUT CONTRAST TECHNIQUE: Multidetector CT imaging of the abdomen and pelvis was performed following the standard protocol without IV contrast. COMPARISON:  June 24, 2018 FINDINGS: Lower chest: Bilateral breast implants are noted. Hepatobiliary: A stable 3.8 cm x 2.8 cm cystic appearing areas seen within the posterior aspect of the liver dome. A stable adjacent subcentimeter cystic appearing lesion is noted. Status post cholecystectomy. No biliary dilatation. Pancreas: Unremarkable. No pancreatic ductal dilatation or surrounding inflammatory changes. Spleen: Normal in size without focal abnormality. Adrenals/Urinary Tract: Adrenal glands are unremarkable. Kidneys are normal, without renal calculi, focal lesion, or hydronephrosis. Bladder is unremarkable. Stomach/Bowel: Stomach is within normal limits. Appendix appears normal. No evidence of bowel wall thickening, distention, or inflammatory changes. Vascular/Lymphatic: No significant vascular  findings are present. No enlarged abdominal or pelvic lymph nodes. Reproductive: Status post hysterectomy. No adnexal masses. Other: No abdominal wall hernia or abnormality. No abdominopelvic ascites. Musculoskeletal: No acute or significant osseous findings. IMPRESSION: 1. Evidence of prior cholecystectomy and hysterectomy. 2. Stable hepatic cysts. Electronically Signed   By: Aram Candela M.D.   On: 07/03/2020 18:17   MR BRAIN W WO CONTRAST  Result Date: 07/03/2020 CLINICAL DATA:  Possible seizure, history of migraines EXAM: MRI HEAD WITHOUT AND WITH CONTRAST TECHNIQUE: Multiplanar, multiecho pulse sequences of the brain and surrounding structures were obtained without and with intravenous contrast. CONTRAST:  5.74mL GADAVIST GADOBUTROL 1 MMOL/ML IV SOLN COMPARISON:  06/30/2020 FINDINGS: Brain: There  is no acute infarction or intracranial hemorrhage. There is no intracranial mass, mass effect, or edema. There is no hydrocephalus or extra-axial fluid collection. Ventricles and sulci are normal in size and configuration. Minimal small foci of T2 hyperintensity are again identified in the supratentorial white matter likely reflecting nonspecific gliosis/demyelination of doubtful clinical significance. No abnormal enhancement. Vascular: Major vessel flow voids at the skull base are preserved. Skull and upper cervical spine: Normal marrow signal is preserved. Sinuses/Orbits: Paranasal sinuses are aerated. Orbits are unremarkable. Other: Sella is unremarkable.  Mastoid air cells are clear. IMPRESSION: No acute abnormality or significant change since 06/30/2020. No abnormal enhancement. Electronically Signed   By: Guadlupe Spanish M.D.   On: 07/03/2020 18:49        Scheduled Meds: . buPROPion  300 mg Oral Daily  . enoxaparin (LOVENOX) injection  40 mg Subcutaneous Q24H  . loratadine  10 mg Oral Daily  . multivitamin with minerals  1 tablet Oral Daily  . pantoprazole (PROTONIX) IV  40 mg Intravenous Q12H  . sucralfate  1 g Oral TID WC & HS  . topiramate  100 mg Oral Daily  . vitamin E  1,000 Units Oral Daily   Continuous Infusions: . sodium chloride 125 mL/hr at 07/04/20 0657     LOS: 1 day   Time spent= 35 mins    Wynelle Dreier Joline Maxcy, MD [facility]  If 7PM-7AM, please contact night-coverage  07/04/2020, 8:26 AM

## 2020-07-04 NOTE — Progress Notes (Signed)
PT Cancellation Note  Patient Details Name: Martha Moss MRN: [id number] DOB: June 06, 1966   Cancelled Treatment:    Reason Eval/Treat Not Completed: Patient at procedure or test/unavailable Pt currently hooked to EEG monitoring. Will follow up as schedule allows.   Farley Ly, PT, DPT  Acute Rehabilitation Services  Pager: [phone] Office: [phone]    Lehman Prom 07/04/2020, 10:18 AM

## 2020-07-04 NOTE — Progress Notes (Signed)
  Echocardiogram 2D Echocardiogram has been performed.  Martha Moss 07/04/2020, 2:18 PM

## 2020-07-04 NOTE — Progress Notes (Signed)
Nutrition Brief Note  Patient identified on the Malnutrition Screening Tool (MST) Report  Wt Readings from Last 15 Encounters:  07/03/20 55 kg  07/03/20 55 kg  06/30/20 56 kg  06/14/20 56.5 kg  12/29/18 57.2 kg  09/08/18 57.6 kg  06/24/18 59 kg  03/04/18 61.2 kg  12/31/17 62.1 kg  12/22/17 62.4 kg  12/19/17 61.7 kg  11/26/17 61.3 kg  08/27/17 62.3 kg  08/23/17 61.2 kg  04/08/17 59.5 kg   BREKLYN FABRIZIO is a 54 y.o. female with medical history significant of complicated migraines, fibromyalgia, GERD, presented with syncope versus seizure.  Patient has had long history of migraines since she was a teenager, has been following with neurosurgery who has been titrating her Topamax, was increased for the last couple years done slowly titrating down from last year.  Pt admitted with syncope vs seizures.   Spoke with pt and husband at bedside. Both report pt has a good appetite PTA; she generally consumes 2-3 meals per day (Breakfast: cereal OR grits and eggs OR protein shake; Lunch and Dinner: meat, starch, and vegetables). Pt reports they both eat healthfully at home secondary to his multiple medical issues- diet consists largely of vegetables and baked chicken and fish ("we eat so much of that we should have scales and feathers"). Observed breakfast tray- pt consumed 75% of tray. She denies any difficulty chewing or swallowing.   Pt reports her UBW is around 125#. She estimates she has lost about 5-7# over the past month, but is unsure why. However, per wt hx, wt has been stable over the past several months.   Nutrition-Focused physical exam completed. Findings are no fat depletion, no muscle depletion, and no edema.   Current diet order is regular, patient is consuming approximately 875% of meals at this time. Labs and medications reviewed.   No nutrition interventions warranted at this time. If nutrition issues arise, please consult RD.   Levada Schilling, RD, LDN, CDCES Registered  Dietitian II Certified Diabetes Care and Education Specialist Please refer to [facility] for RD and/or RD on-call/weekend/after hours pager

## 2020-07-04 NOTE — Consult Note (Signed)
[facility] Consult: 8:15 AM 07/04/2020  LOS: 1 day    Referring Provider: Dr Nelson Chimes  Primary Care Physician:  Sheliah Hatch, MD Primary Gastroenterologist:  None.  Remotely: Martha Ku MD Husband Martha Moss, cell phone [phone]   Reason for Consultation:  R abd pain   HPI: Martha Moss is a 54 y.o. female.  PMH interstitial cystitis, 2001 cysto.  S/p ovarian cyst surgery.  S/p partial hysterectomy.  S/p cholecystectomy.  S/p ethmoidectomy. Fibromyalgia.  Migraines.  Benign liver cysts ( largest 3.1 cm) per CTAP in 08/2017, 06/2018.  IDA in her 54s associated with menorrhagia Colonoscopy 2003, Dr Kinnie Scales.  No op note found.  No prior EGD.   Generally healthy.  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  Rare wine (a bottle would last her ~ 1 year).    Several months R abd pain, sometimes worse post food but not after liquids, but not necessarily triggered by pain.  Constant.  Not better after PCP added Pantoprazole and Carafate.  Pain not radiating but has occasional thoracic back pain.  No ASA, no NSAIDs.  No change in bowel habits.  Appetite good until last few days.  Weight drop ~ 5 to 7 # over several weeks.    Seen at ED and cleared for discharge home on 11/20 for slurred speech and several d of confusion.  MR brain negative.  Advised to fup w neuro Dr Daisy Blossom.  Developed seizures at neurologist's office yest AM and sent to ED.   EEG in progress, about to complete the 24 h monitor.   CTAP w/o contrast: Stable hepatic cysts.  No biliary ductal dilatation.  Unremarkable pancreas, spleen, stomach, intestine.  Postsurgical changes of cholecystectomy and hysterectomy. MRI brain unremarkable LFTs, including albumin all normal.  Hb 12, MCV 96  No family history of colorectal,  small bowel or gastric disease/cancer.    Past Medical History:  Diagnosis Date  . Depression   . Fibromyalgia   . GERD (gastroesophageal reflux disease)   . Migraine   . PONV (postoperative nausea and vomiting)     Past Surgical History:  Procedure Laterality Date  . CHOLECYSTECTOMY    . ETHMOIDECTOMY Bilateral 09/09/2016   Procedure: ETHMOIDECTOMY;  Surgeon: Newman Pies, MD;  Location: [facility];  Service: ENT;  Laterality: Bilateral;  . FRONTAL SINUS EXPLORATION Bilateral 09/09/2016   Procedure: FRONTAL RECESS SINUS EXPLORATION;  Surgeon: Newman Pies, MD;  Location: [facility];  Service: ENT;  Laterality: Bilateral;  . MAXILLARY ANTROSTOMY Bilateral 09/09/2016   Procedure: MAXILLARY ANTROSTOMY WITH TISSUE REMOVAL;  Surgeon: Newman Pies, MD;  Location: [facility];  Service: ENT;  Laterality: Bilateral;  . OVARIAN CYST SURGERY    . SINUS ENDO W/FUSION Bilateral 09/09/2016   Procedure: ENDOSCOPIC SINUS SURGERY WITH NAVIGATION;  Surgeon: Newman Pies, MD;  Location: [facility];  Service: ENT;  Laterality: Bilateral;  . TOTAL VAGINAL HYSTERECTOMY    . TURBINATE REDUCTION Bilateral 09/09/2016   Procedure: BILATERAL TURBINATE REDUCTION;  Surgeon: Janeece Riggers  Suszanne Conners, MD;  Location: [facility];  Service: ENT;  Laterality: Bilateral;    Prior to Admission medications   Medication Sig Start Date End Date Taking? Authorizing Provider  buPROPion (WELLBUTRIN XL) 300 MG 24 hr tablet TAKE 1 TABLET BY MOUTH DAILY 04/27/20  Yes Sheliah Hatch, MD  fexofenadine (ALLEGRA) 180 MG tablet Take 180 mg by mouth daily.   Yes [provider]  levocetirizine (XYZAL) 5 MG tablet Take 5 mg by mouth every evening.   Yes [provider]  magnesium 30 MG tablet Take 30 mg by mouth every other day.   Yes [provider]  Multiple Vitamin (MULTIVITAMIN WITH MINERALS) TABS tablet Take 1 tablet by mouth daily.   Yes [provider]    pantoprazole (PROTONIX) 40 MG tablet TAKE 1 TABLET BY MOUTH DAILY 03/02/20  Yes Sheliah Hatch, MD  sucralfate (CARAFATE) 1 g tablet Take 1 tablet (1 g total) by mouth 4 (four) times daily -  with meals and at bedtime. 10/20/19  Yes Sheliah Hatch, MD  topiramate (TOPAMAX) 200 MG tablet TAKE 1 TABLET BY MOUTH TWICE DAILY Patient taking differently: Take 100 mg by mouth daily.  03/14/20  Yes Sheliah Hatch, MD  vitamin E 1000 UNIT capsule Take 1,000 Units by mouth daily.   Yes [provider]  linaclotide Karlene Einstein) 72 MCG capsule Take 1 capsule (72 mcg total) by mouth daily before breakfast. Patient not taking: Reported on 07/03/2020 02/04/18   Sheliah Hatch, MD  triamcinolone (NASACORT) 55 MCG/ACT AERO nasal inhaler Place 2 sprays into the nose daily. Patient not taking: Reported on 07/03/2020 11/30/19   Waldon Merl, PA-C    Scheduled Meds: . buPROPion  300 mg Oral Daily  . enoxaparin (LOVENOX) injection  40 mg Subcutaneous Q24H  . loratadine  10 mg Oral Daily  . multivitamin with minerals  1 tablet Oral Daily  . pantoprazole (PROTONIX) IV  40 mg Intravenous Q12H  . sucralfate  1 g Oral TID WC & HS  . topiramate  100 mg Oral Daily  . vitamin E  1,000 Units Oral Daily   Infusions: . sodium chloride 125 mL/hr at 07/04/20 0657   PRN Meds: acetaminophen **OR** acetaminophen, ondansetron **OR** ondansetron (ZOFRAN) IV   Allergies as of 07/03/2020 - Review Complete 07/03/2020  Allergen Reaction Noted  . Bee venom  03/14/2013  . Azithromycin  10/19/2009  . Diphenhydramine hcl  10/19/2009  . Erythromycin  10/19/2009  . Latex  10/19/2009  . Morphine and related Nausea And Vomiting 12/22/2014  . Penicillins  10/19/2009  . Percocet [oxycodone-acetaminophen] Other (See Comments) 09/09/2016  . Sulfonamide derivatives  10/19/2009  . Tetracyclines & related  01/16/2012    Family History  Problem Relation Age of Onset  . Kidney Stones Daughter   .  Migraines Mother   . Heart attack Father 40  . Diabetes Maternal Grandmother   . Stroke Sister   . Migraines Brother   . Stroke Brother   . Migraines Other        many family members on maternal side     Social History   Socioeconomic History  . Marital status: Married    Spouse name: Not on file  . Number of children: Not on file  . Years of education: Not on file  . Highest education level: Not on file  Occupational History  . Not on file  Tobacco Use  . Smoking status: Never Smoker  . Smokeless  tobacco: Never Used  Vaping Use  . Vaping Use: Never used  Substance and Sexual Activity  . Alcohol use: Yes    Comment: rarely  . Drug use: No  . Sexual activity: Yes    Birth control/protection: Surgical  Other Topics Concern  . Not on file  Social History Narrative   Lives at home with husband    Right handed   Caffeine: none    Social Determinants of Health   Financial Resource Strain:   . Difficulty of Paying Living Expenses: Not on file  Food Insecurity:   . Worried About Programme researcher, broadcasting/film/videounning Out of Food in the Last Year: Not on file  . Ran Out of Food in the Last Year: Not on file  Transportation Needs:   . Lack of Transportation (Medical): Not on file  . Lack of Transportation (Non-Medical): Not on file  Physical Activity:   . Days of Exercise per Week: Not on file  . Minutes of Exercise per Session: Not on file  Stress:   . Feeling of Stress : Not on file  Social Connections:   . Frequency of Communication with Friends and Family: Not on file  . Frequency of Social Gatherings with Friends and Family: Not on file  . Attends Religious Services: Not on file  . Active Member of Clubs or Organizations: Not on file  . Attends BankerClub or Organization Meetings: Not on file  . Marital Status: Not on file  Intimate Partner Violence:   . Fear of Current or Ex-Partner: Not on file  . Emotionally Abused: Not on file  . Physically Abused: Not on file  . Sexually Abused: Not on  file    REVIEW OF SYSTEMS: Constitutional: No profound fatigue but has been feeling sluggish lately. ENT:  No nose bleeds.  No oral ulcers or sores in the mouth Pulm: No shortness of breath or cough. CV:  No palpitations, no LE edema.  No angina GU:  No hematuria, no frequency.  Dysuria GI: See HPI.  No dysphagia. Heme: No unusual or excessive bleeding or bruising Transfusions: None Neuro: See HPI. Derm:  No itching, no rash or sores.  Endocrine:  No sweats or chills.  No polyuria or dysuria Immunization: Received Pfizer COVID-19 vaccines in March and April 2021.  Up-to-date on her flu shot Travel:  None beyond local counties in last few months.    PHYSICAL EXAM: Vital signs in last 24 hours: Vitals:   07/03/20 2350 07/04/20 0803  BP: (!) 120/59 (!) 101/53  Pulse: 82 90  Resp: 18 16  Temp: 97.8 F (36.6 C) 98.3 F (36.8 C)  SpO2: 98% 100%   Wt Readings from Last 3 Encounters:  07/03/20 55 kg  07/03/20 55 kg  06/30/20 56 kg    General: Pleasant, comfortable, does not look ill Head: No facial asymmetry or swelling.  No signs of head trauma. Eyes: No conjunctival pallor.  No scleral icterus.  EOMI Ears: Not HOH Nose: No congestion or discharge Mouth: Moist, pink, clear oral mucosa.  Good dentition.  Tongue midline without fasciculation. Neck: No JVD, no masses, no thyromegaly Lungs: Labored breathing or cough.  Lungs clear bilaterally with good breath sounds. Heart: RRR.  No MRG.  S1, S2 present Abdomen: Soft.  Nondistended.  Active bowel sounds.  Mild to at most moderate tenderness across the entire right abdomen.  No guarding or rebound.  No masses.  No HSM, no bruits, no hernia.   Rectal: None Musc/Skeltl: No joint redness, swelling  or gross deformity. Extremities: No CCE. Neurologic: As all 4 limbs, strength not tested.  No tremor or involuntary movement. Skin: No rash, no sores, no telangiectasia. Tattoos: None observed Nodes: No cervical adenopathy Psych:  Calm, pleasant, moderately flat affect.  Cooperative and follows all commands  Intake/Output from previous day: 11/23 0701 - 11/24 0700 In: 717 [I.V.:717] Out: 300 [Urine:300] Intake/Output this shift: No intake/output data recorded.  LAB RESULTS: Recent Labs    07/03/20 0916 07/03/20 1542 07/04/20 0014  WBC 6.7 8.6 7.5  HGB 14.5 13.4 12.0  HCT 45.4 41.8 36.8  PLT 325 310 262   BMET Lab Results  Component Value Date   NA 142 07/04/2020   NA 141 07/03/2020   NA 142 07/03/2020   K 4.0 07/04/2020   K 4.3 07/03/2020   K 4.3 07/03/2020   CL 113 (H) 07/04/2020   CL 106 07/03/2020   CL 105 07/03/2020   CO2 19 (L) 07/04/2020   CO2 24 07/03/2020   CO2 27 07/03/2020   GLUCOSE 101 (H) 07/04/2020   GLUCOSE 101 (H) 07/03/2020   GLUCOSE 107 (H) 07/03/2020   BUN 14 07/04/2020   BUN 18 07/03/2020   BUN 15 07/03/2020   CREATININE 0.87 07/04/2020   CREATININE 0.83 07/03/2020   CREATININE 1.04 (H) 07/03/2020   CALCIUM 9.0 07/04/2020   CALCIUM 9.8 07/03/2020   CALCIUM 10.0 07/03/2020   LFT Recent Labs    07/03/20 1004 07/04/20 0014  PROT 7.1 6.0*  ALBUMIN 4.2 3.6  AST 21 17  ALT 15 14  ALKPHOS 63 52  BILITOT 0.8 0.7   PT/INR No results found for: INR, PROTIME Hepatitis Panel No results for input(s): HEPBSAG, HCVAB, HEPAIGM, HEPBIGM in the last 72 hours. C-Diff No components found for: CDIFF Lipase     Component Value Date/Time   LIPASE 32 06/24/2018 2000    Drugs of Abuse     Component Value Date/Time   LABOPIA NONE DETECTED 07/03/2020 1003   COCAINSCRNUR NONE DETECTED 07/03/2020 1003   LABBENZ NONE DETECTED 07/03/2020 1003   AMPHETMU NONE DETECTED 07/03/2020 1003   THCU NONE DETECTED 07/03/2020 1003   LABBARB NONE DETECTED 07/03/2020 1003     RADIOLOGY STUDIES: EEG  Result Date: 07/03/2020 Martha Quest, MD     07/03/2020  1:15 PM Patient Name: LATRISH MOGEL MRN: [id number] Epilepsy Attending: Charlsie Moss Referring Physician/Provider:  Dietrich Pates, PA Date: 03/02/2020 Duration: Patient history: 54 year old female with new onset seizure-like episodes.  EEG evaluate for seizures. Level of alertness: Awake AEDs during EEG study: None Technical aspects: This EEG study was done with scalp electrodes positioned according to the 10-20 International system of electrode placement. Electrical activity was acquired at a sampling rate of  and reviewed with a high frequency filter of  and a low frequency filter of . EEG data were recorded continuously and digitally stored. Description: The posterior dominant rhythm consists of 10 Hz activity of moderate voltage (25-35 uV) seen predominantly in posterior head regions, symmetric and reactive to eye opening and eye closing.  Hyperventilation and photic stimulation were not performed.   IMPRESSION: This study is within normal limits. No seizures or epileptiform discharges were seen throughout the recording. Priyanka Annabelle Harman   CT ABDOMEN PELVIS WO CONTRAST  Result Date: 07/03/2020 CLINICAL DATA:  Abdominal pain. EXAM: CT ABDOMEN AND PELVIS WITHOUT CONTRAST TECHNIQUE: Multidetector CT imaging of the abdomen and pelvis was performed following the standard protocol without IV contrast. COMPARISON:  June 24, 2018 FINDINGS: Lower chest: Bilateral breast implants are noted. Hepatobiliary: A stable 3.8 cm x 2.8 cm cystic appearing areas seen within the posterior aspect of the liver dome. A stable adjacent subcentimeter cystic appearing lesion is noted. Status post cholecystectomy. No biliary dilatation. Pancreas: Unremarkable. No pancreatic ductal dilatation or surrounding inflammatory changes. Spleen: Normal in size without focal abnormality. Adrenals/Urinary Tract: Adrenal glands are unremarkable. Kidneys are normal, without renal calculi, focal lesion, or hydronephrosis. Bladder is unremarkable. Stomach/Bowel: Stomach is within normal limits. Appendix appears normal. No evidence of bowel wall  thickening, distention, or inflammatory changes. Vascular/Lymphatic: No significant vascular findings are present. No enlarged abdominal or pelvic lymph nodes. Reproductive: Status post hysterectomy. No adnexal masses. Other: No abdominal wall hernia or abnormality. No abdominopelvic ascites. Musculoskeletal: No acute or significant osseous findings. IMPRESSION: 1. Evidence of prior cholecystectomy and hysterectomy. 2. Stable hepatic cysts. Electronically Signed   By: Aram Candela M.D.   On: 07/03/2020 18:17   MR BRAIN W WO CONTRAST  Result Date: 07/03/2020 CLINICAL DATA:  Possible seizure, history of migraines EXAM: MRI HEAD WITHOUT AND WITH CONTRAST TECHNIQUE: Multiplanar, multiecho pulse sequences of the brain and surrounding structures were obtained without and with intravenous contrast. CONTRAST:  5.69mL GADAVIST GADOBUTROL 1 MMOL/ML IV SOLN COMPARISON:  06/30/2020 FINDINGS: Brain: There is no acute infarction or intracranial hemorrhage. There is no intracranial mass, mass effect, or edema. There is no hydrocephalus or extra-axial fluid collection. Ventricles and sulci are normal in size and configuration. Minimal small foci of T2 hyperintensity are again identified in the supratentorial white matter likely reflecting nonspecific gliosis/demyelination of doubtful clinical significance. No abnormal enhancement. Vascular: Major vessel flow voids at the skull base are preserved. Skull and upper cervical spine: Normal marrow signal is preserved. Sinuses/Orbits: Paranasal sinuses are aerated. Orbits are unremarkable. Other: Sella is unremarkable.  Mastoid air cells are clear. IMPRESSION: No acute abnormality or significant change since 06/30/2020. No abnormal enhancement. Electronically Signed   By: Guadlupe Spanish M.D.   On: 07/03/2020 18:49     IMPRESSION:   *   Several months of R abdominal pain unremarkable on con CTAP, unremarkable labs  *   New onset seizures.  Prodrome confusion and slurred  speech.  MRI brain unremarkable x 2.  Last seizure activity was yesterday 11/23 in evening.    PLAN:     *   EGD and probable colonoscopy as well once the seizure work-up is completed and she is cleared for sedation.   Martha Moss  07/04/2020, 8:15 AM Phone [phone]

## 2020-07-04 NOTE — Progress Notes (Signed)
Subjective: Patient had 1 event yesterday where she was awake, staring but nonverbal.  Husband states this was similar to the event she had at the clinic.  Patient states she is feeling a little better today but still not back to baseline.  Also states her headache is better but Toradol and Compazine yesterday made her feel sleepy and she would prefer not to take it again.  ROS: negative except above  Examination  Vital signs in last 24 hours: Temp:  [97.8 F (36.6 C)-98.5 F (36.9 C)] 98.4 F (36.9 C) (11/24 1155) Pulse Rate:  [61-109] 97 (11/24 1155) Resp:  [10-20] 18 (11/24 1155) BP: (59-131)/(43-86) 113/68 (11/24 1155) SpO2:  [96 %-100 %] 99 % (11/24 1155) Weight:  [55 kg] 55 kg (11/23 2030)  General: lying in bed, not in apparent distress CVS: pulse-normal rate and rhythm RS: breathing comfortably, CTA B Extremities: normal, warm  Neuro: AOx3, cranial nerves 2-12 grossly intact, 5/5 in all 4 extremities, 3/5 5-minute recall, able to spell world backwards, able to do serial 7 subtractions  Basic Metabolic Panel: Recent Labs  Lab 06/30/20 1804 06/30/20 1804 06/30/20 1809 07/03/20 0916 07/03/20 1004 07/04/20 0014  NA 143  --  144 142 141 142  K 4.1  --  4.1 4.3 4.3 4.0  CL 109  --  110 105 106 113*  CO2 24  --   --  27 24 19*  GLUCOSE 98  --  96 107* 101* 101*  BUN 14  --  20 15 18 14   CREATININE 0.85  --  0.70 1.04* 0.83 0.87  CALCIUM 9.7   < >  --  10.0 9.8 9.0   < > = values in this interval not displayed.    CBC: Recent Labs  Lab 06/30/20 1804 06/30/20 1809 07/03/20 0916 07/03/20 1542 07/04/20 0014  WBC 6.1  --  6.7 8.6 7.5  NEUTROABS 1.9  --  3.9 4.2 4.6  HGB 13.3 13.9 14.5 13.4 12.0  HCT 42.5 41.0 45.4 41.8 36.8  MCV 98.2  --  97.6 97.4 96.8  PLT 262  --  325 310 262     Coagulation Studies: No results for input(s): LABPROT, INR in the last 72 hours.  Imaging MRI brain with contrast: No acute abnormality.  No abnormal enhancement  ASSESSMENT  AND PLAN: 54 year old female with new onset alteration of awareness, speech disturbance.  Nonepileptic spell Headache -We captured a nonepileptic event on video EEG yesterday.  Patient reports increased stress recently.  It is likely that increased stress and worsening insomnia with subsequent Ambien use, decreased p.o. intake all led to patient's current symptoms.   Recommendations: - Discussed the diagnosis of nonepileptic events with patient. -As patient reports she is not back to baseline, we will continue video EEG for another 24 hours to look for any ictal-interictal abnormality.  If EEG continues to be normal tomorrow, will discontinue as it is unlikely that patient has epilepsy at that point. -We will hold off topiramate today as it can occasionally worsen word finding difficulties.  Will resume tomorrow if patient starts improving -Recommend holding off Ambien as patient symptoms started after 57   I have spent a total of  35 minutes with the patient reviewing hospital notes,  test results, labs and examining the patient as well as establishing an assessment and plan that was discussed personally with the patient, her husband.  > 50% of time was spent in direct patient care.   Remus Loffler [facility] For questions after 5pm please refer to AMION to reach the Neurologist on call

## 2020-07-04 NOTE — Progress Notes (Signed)
LTM maint complete - no skin breakdown under: FP1,FP2,F8   

## 2020-07-05 LAB — CBC
HCT: 38.5 % (ref 36.0–46.0)
Hemoglobin: 12.4 g/dL (ref 12.0–15.0)
MCH: 31.1 pg (ref 26.0–34.0)
MCHC: 32.2 g/dL (ref 30.0–36.0)
MCV: 96.5 fL (ref 80.0–100.0)
Platelets: 252 10*3/uL (ref 150–400)
RBC: 3.99 MIL/uL (ref 3.87–5.11)
RDW: 12.7 % (ref 11.5–15.5)
WBC: 4.9 10*3/uL (ref 4.0–10.5)
nRBC: 0 % (ref 0.0–0.2)

## 2020-07-05 LAB — GLUCOSE, CAPILLARY
Glucose-Capillary: 67 mg/dL — ABNORMAL LOW (ref 70–99)
Glucose-Capillary: 133 mg/dL — ABNORMAL HIGH (ref 70–99)
Glucose-Capillary: 90 mg/dL (ref 70–99)
Glucose-Capillary: 118 mg/dL — ABNORMAL HIGH (ref 70–99)

## 2020-07-05 LAB — COMPREHENSIVE METABOLIC PANEL
ALT: 13 U/L (ref 0–44)
AST: 18 U/L (ref 15–41)
Albumin: 3.8 g/dL (ref 3.5–5.0)
Alkaline Phosphatase: 58 U/L (ref 38–126)
Anion gap: 10 (ref 5–15)
BUN: 9 mg/dL (ref 6–20)
CO2: 19 mmol/L — ABNORMAL LOW (ref 22–32)
Calcium: 8.9 mg/dL (ref 8.9–10.3)
Chloride: 113 mmol/L — ABNORMAL HIGH (ref 98–111)
Creatinine, Ser: 0.76 mg/dL (ref 0.44–1.00)
GFR, Estimated: >60 mL/min (ref >60)
Glucose, Bld: 137 mg/dL — ABNORMAL HIGH (ref 70–99)
Potassium: 3.3 mmol/L — ABNORMAL LOW (ref 3.5–5.1)
Sodium: 142 mmol/L (ref 135–145)
Total Bilirubin: 0.5 mg/dL (ref 0.3–1.2)
Total Protein: 6.4 g/dL — ABNORMAL LOW (ref 6.5–8.1)

## 2020-07-05 LAB — MAGNESIUM: Magnesium: 2.1 mg/dL (ref 1.7–2.4)

## 2020-07-05 LAB — CORTISOL-AM, BLOOD: Cortisol - AM: 11.4 ug/dL (ref 6.7–22.6)

## 2020-07-05 MED ORDER — SODIUM CHLORIDE 0.9 % IV SOLN: INTRAVENOUS | Status: DC

## 2020-07-05 MED ORDER — TOPIRAMATE 100 MG PO TABS
100.0000 mg | ORAL_TABLET | Freq: Every day | ORAL | Status: DC
  Administered 2020-07-05 – 2020-07-06 (×2): 100 mg via ORAL
  Filled 2020-07-05 (×2): qty 1

## 2020-07-05 MED ORDER — ACETAMINOPHEN 500 MG PO TABS
1000.0000 mg | ORAL_TABLET | Freq: Three times a day (TID) | ORAL | Status: DC | PRN
  Administered 2020-07-05: 1000 mg via ORAL
  Filled 2020-07-05: qty 2

## 2020-07-05 MED ORDER — OXYMETAZOLINE HCL 0.05 % NA SOLN
1.0000 | Freq: Two times a day (BID) | NASAL | Status: DC
  Administered 2020-07-05 – 2020-07-06 (×3): 1 via NASAL
  Filled 2020-07-05: qty 30

## 2020-07-05 MED ORDER — POTASSIUM CHLORIDE CRYS ER 20 MEQ PO TBCR
40.0000 meq | EXTENDED_RELEASE_TABLET | Freq: Once | ORAL | Status: AC
  Administered 2020-07-05: 40 meq via ORAL
  Filled 2020-07-05: qty 2

## 2020-07-05 NOTE — Progress Notes (Signed)
PROGRESS NOTE    [name]  BMW:[id number] DOB: 12/28/1965 DOA: 07/03/2020 PCP: Sheliah Hatchabori, Katherine E, MD   Brief Narrative:  54 year old with history of chronic migraine, fibromyalgia, GERD admitted for concerns of syncope versus seizures.  Recently she has been having speech difficulty for which he presented to the ER few days prior to admission and her MRI brain was negative and discharged home.  After going home continued to have headache and nausea also became unresponsive at her PCPs office therefore sent to the ER.  Neurology team consulted and performing EEG.  Appeared to have nonepileptic activity while being on seizure therefore concerns of pseudoseizure as she has been stressful.  GI team planning on endoscopic evaluation once cleared by neurology.   Assessment & Plan:   Active Problems:   Seizure (HCC)   Hypotension   Esophageal dysphagia   Right upper quadrant abdominal pain   Nausea without vomiting  Syncope with concern for possible seizures Atypical migraine -Concerns for possible pseudoseizures induced by stress -MRI brain without contrast-negative -Echocardiogram-EF 55%, mild pericardial effusion overall unremarkable -TSH and cortisol-normal -Alcohol, UDS-negative -Questionable prolonged QTC on admission but repeat EKG 11/24-normal QTC. -Discussed with the family that she will need to follow-up outpatient with psychologist/psychiatry to help with her anxiety  Epigastric abdominal pain with nausea -Appreciate GI input, tentative plans for endoscopic evaluation 11/26 once cleared by neurology -PPI IV twice daily -CT abdomen pelvis without contrast-negative for acute pathology  Lactic acidosis -Normal saline 125 cc/hr.  Repeat labs from today pending  History of anxiety/depression -Wellbutrin 300 mg daily  GERD -PPI    DVT prophylaxis: enoxaparin (LOVENOX) injection 40 mg Start: 07/03/20 1500  Code Status: Full Family Communication: Husband is  at bedside  Status is: Inpatient  Remains inpatient appropriate because:Hemodynamically unstable   Dispo: The patient is from: Home              Anticipated d/c is to: Home              Anticipated d/c date is: 2 days              Patient currently is not medically stable to d/c.  Still undergoing work-up for syncope versus seizure.  Thereafter she also requires endoscopic evaluation for epigastric pain    Body mass index is 22.18 kg/m.     Subjective: Patient feels okay, her husband seems to think that she gets weaker and her speech becomes more dysarthric as the day progresses.  No acute events overnight otherwise   Examination:  Constitutional: Not in acute distress Respiratory: Clear to auscultation bilaterally Cardiovascular: Normal sinus rhythm, no rubs Abdomen: Nontender nondistended good bowel sounds Musculoskeletal: No edema noted Skin: No rashes seen Neurologic: CN 2-12 grossly intact.  Dysarthric speech Psychiatric: Normal judgment and insight. Alert and oriented x 3. Normal mood.   Objective: Vitals:   07/04/20 1626 07/04/20 2019 07/05/20 0048 07/05/20 0437  BP: (!) 112/56 (!) 110/51 (!) 101/53 (!) 102/51  Pulse: 95 77 74 74  Resp: 18 18 17 16   Temp: 98.7 F (37.1 C) 99.3 F (37.4 C) 98.2 F (36.8 C) 97.9 F (36.6 C)  TempSrc: Oral  Oral Oral  SpO2: 100% 100% 100% 98%  Weight:      Height:        Intake/Output Summary (Last 24 hours) at 07/05/2020 0812 Last data filed at 07/05/2020 0548 Gross per 24 hour  Intake 400 ml  Output 1500 ml  Net -1100 ml  Filed Weights   07/03/20 2030  Weight: 55 kg     Data Reviewed:   CBC: Recent Labs  Lab 06/30/20 1804 06/30/20 1809 07/03/20 0916 07/03/20 1542 07/04/20 0014  WBC 6.1  --  6.7 8.6 7.5  NEUTROABS 1.9  --  3.9 4.2 4.6  HGB 13.3 13.9 14.5 13.4 12.0  HCT 42.5 41.0 45.4 41.8 36.8  MCV 98.2  --  97.6 97.4 96.8  PLT 262  --  325 310 262   Basic Metabolic Panel: Recent Labs  Lab  06/30/20 1804 06/30/20 1809 07/03/20 0916 07/03/20 1004 07/04/20 0014  NA 143 144 142 141 142  K 4.1 4.1 4.3 4.3 4.0  CL 109 110 105 106 113*  CO2 24  --  27 24 19*  GLUCOSE 98 96 107* 101* 101*  BUN CREATININE 0.85 0.70 1.04* 0.83 0.87  CALCIUM 9.7  --  10.0 9.8 9.0   GFR: Estimated Creatinine Clearance: 58.5 mL/min (by C-G formula based on SCr of 0.87 mg/dL). Liver Function Tests: Recent Labs  Lab 06/30/20 1804 07/03/20 1004 07/04/20 0014  AST ALT ALKPHOS 63 63 52  BILITOT 0.6 0.8 0.7  PROT 7.3 7.1 6.0*  ALBUMIN 4.2 4.2 3.6   No results for input(s): LIPASE, AMYLASE in the last 168 hours. Recent Labs  Lab 07/03/20 1004  AMMONIA 26   Coagulation Profile: No results for input(s): INR, PROTIME in the last 168 hours. Cardiac Enzymes: No results for input(s): CKTOTAL, CKMB, CKMBINDEX, TROPONINI in the last 168 hours. BNP (last 3 results) No results for input(s): PROBNP in the last 8760 hours. HbA1C: Recent Labs    07/03/20 1942  HGBA1C 5.1   CBG: Recent Labs  Lab 07/03/20 0907 07/04/20 0013 07/04/20 1157 07/04/20 1627 07/04/20 2234  GLUCAP 96 92 136* 105* 108*   Lipid Profile: No results for input(s): CHOL, HDL, LDLCALC, TRIG, CHOLHDL, LDLDIRECT in the last 72 hours. Thyroid Function Tests: Recent Labs    07/04/20 0857  TSH 0.913   Anemia Panel: No results for input(s): VITAMINB12, FOLATE, FERRITIN, TIBC, IRON, RETICCTPCT in the last 72 hours. Sepsis Labs: Recent Labs  Lab 07/03/20 1942 07/04/20 0014  LATICACIDVEN 3.2* 2.2*    Recent Results (from the past 240 hour(s))  Respiratory Panel by RT PCR (Flu A&B, Covid) - Nasopharyngeal Swab     Status: None   Collection Time: 07/03/20 10:02 AM   Specimen: Nasopharyngeal Swab; Nasopharyngeal(NP) swabs in vial transport medium  Result Value Ref Range Status   SARS Coronavirus 2 by RT PCR NEGATIVE NEGATIVE Final    Comment: (NOTE) SARS-CoV-2 target nucleic  acids are NOT DETECTED.  The SARS-CoV-2 RNA is generally detectable in upper respiratoy specimens during the acute phase of infection. The lowest concentration of SARS-CoV-2 viral copies this assay can detect is 131 copies/mL. A negative result does not preclude SARS-Cov-2 infection and should not be used as the sole basis for treatment or other patient management decisions. A negative result may occur with  improper specimen collection/handling, submission of specimen other than nasopharyngeal swab, presence of viral mutation(s) within the areas targeted by this assay, and inadequate number of viral copies (<131 copies/mL). A negative result must be combined with clinical observations, patient history, and epidemiological information. The expected result is Negative.  Fact Sheet for Patients:  https://www.moore.com/  Fact Sheet for Healthcare Providers:  https://www.young.biz/  This test is no t yet approved or cleared  by the [place] and  has been authorized for detection and/or diagnosis of SARS-CoV-2 by FDA under an Emergency Use Authorization (EUA). This EUA will remain  in effect (meaning this test can be used) for the duration of the COVID-19 declaration under Section 564(b)(1) of the Act, 21 U.S.C. section 360bbb-3(b)(1), unless the authorization is terminated or revoked sooner.     Influenza A by PCR NEGATIVE NEGATIVE Final   Influenza B by PCR NEGATIVE NEGATIVE Final    Comment: (NOTE) The Xpert Xpress SARS-CoV-2/FLU/RSV assay is intended as an aid in  the diagnosis of influenza from Nasopharyngeal swab specimens and  should not be used as a sole basis for treatment. Nasal washings and  aspirates are unacceptable for Xpert Xpress SARS-CoV-2/FLU/RSV  testing.  Fact Sheet for Patients: https://www.moore.com/  Fact Sheet for Healthcare Providers: https://www.young.biz/  This test  is not yet approved or cleared by the [place] FDA and  has been authorized for detection and/or diagnosis of SARS-CoV-2 by  FDA under an Emergency Use Authorization (EUA). This EUA will remain  in effect (meaning this test can be used) for the duration of the  Covid-19 declaration under Section 564(b)(1) of the Act, 21  U.S.C. section 360bbb-3(b)(1), unless the authorization is  terminated or revoked. Performed at [facility], [address] [place], [place] [address]          Radiology Studies: EEG  Result Date: 07/03/2020 Charlsie Quest, MD     07/04/2020  9:17 AM Patient Name: Martha Moss MRN: [id number] Epilepsy Attending: Charlsie Quest Referring Physician/Provider: Dietrich Pates, PA Date: 07/03/2020 Duration: 24.05 mins Patient history: 54 year old female with new onset seizure-like episodes.  EEG evaluate for seizures. Level of alertness: Awake AEDs during EEG study: None Technical aspects: This EEG study was done with scalp electrodes positioned according to the 10-20 International system of electrode placement. Electrical activity was acquired at a sampling rate of 500Hz  and reviewed with a high frequency filter of 70Hz  and a low frequency filter of 1Hz . EEG data were recorded continuously and digitally stored. Description: The posterior dominant rhythm consists of 10 Hz activity of moderate voltage (25-35 uV) seen predominantly in posterior head regions, symmetric and reactive to eye opening and eye closing.  Hyperventilation and photic stimulation were not performed.   IMPRESSION: This study is within normal limits. No seizures or epileptiform discharges were seen throughout the recording. Priyanka   CT ABDOMEN PELVIS WO CONTRAST  Result Date: 07/03/2020 CLINICAL DATA:  Abdominal pain. EXAM: CT ABDOMEN AND PELVIS WITHOUT CONTRAST TECHNIQUE: Multidetector CT imaging of the abdomen and pelvis was performed following the standard protocol without IV  contrast. COMPARISON:  June 24, 2018 FINDINGS: Lower chest: Bilateral breast implants are noted. Hepatobiliary: A stable 3.8 cm x 2.8 cm cystic appearing areas seen within the posterior aspect of the liver dome. A stable adjacent subcentimeter cystic appearing lesion is noted. Status post cholecystectomy. No biliary dilatation. Pancreas: Unremarkable. No pancreatic ductal dilatation or surrounding inflammatory changes. Spleen: Normal in size without focal abnormality. Adrenals/Urinary Tract: Adrenal glands are unremarkable. Kidneys are normal, without renal calculi, focal lesion, or hydronephrosis. Bladder is unremarkable. Stomach/Bowel: Stomach is within normal limits. Appendix appears normal. No evidence of bowel wall thickening, distention, or inflammatory changes. Vascular/Lymphatic: No significant vascular findings are present. No enlarged abdominal or pelvic lymph nodes. Reproductive: Status post hysterectomy. No adnexal masses. Other: No abdominal wall hernia or abnormality. No abdominopelvic ascites. Musculoskeletal: No acute or  significant osseous findings. IMPRESSION: 1. Evidence of prior cholecystectomy and hysterectomy. 2. Stable hepatic cysts. Electronically Signed   By: Aram Candela M.D.   On: 07/03/2020 18:17   MR BRAIN W WO CONTRAST  Result Date: 07/03/2020 CLINICAL DATA:  Possible seizure, history of migraines EXAM: MRI HEAD WITHOUT AND WITH CONTRAST TECHNIQUE: Multiplanar, multiecho pulse sequences of the brain and surrounding structures were obtained without and with intravenous contrast. CONTRAST:  5.37mL GADAVIST GADOBUTROL 1 MMOL/ML IV SOLN COMPARISON:  06/30/2020 FINDINGS: Brain: There is no acute infarction or intracranial hemorrhage. There is no intracranial mass, mass effect, or edema. There is no hydrocephalus or extra-axial fluid collection. Ventricles and sulci are normal in size and configuration. Minimal small foci of T2 hyperintensity are again identified in the  supratentorial white matter likely reflecting nonspecific gliosis/demyelination of doubtful clinical significance. No abnormal enhancement. Vascular: Major vessel flow voids at the skull base are preserved. Skull and upper cervical spine: Normal marrow signal is preserved. Sinuses/Orbits: Paranasal sinuses are aerated. Orbits are unremarkable. Other: Sella is unremarkable.  Mastoid air cells are clear. IMPRESSION: No acute abnormality or significant change since 06/30/2020. No abnormal enhancement. Electronically Signed   By: Guadlupe Spanish M.D.   On: 07/03/2020 18:49   ECHOCARDIOGRAM COMPLETE  Result Date: 07/04/2020    ECHOCARDIOGRAM REPORT   Patient Name:   Martha Moss Date of Exam: 07/04/2020 Medical Rec #:  [id number]            Height:       62.0 in Accession #:    [id number]           Weight:       121.3 lb Date of Birth:  1966/07/27             BSA:          1.545 m Patient Age:    54 years             BP:           173/37 mmHg Patient Gender: F                    HR:           89 bpm. Exam Location:  Inpatient Procedure: 2D Echo Indications:    Syncope R55  History:        Patient has prior history of Echocardiogram examinations, most                 recent 10/18/2019. Risk Factors:Diabetes and Hypertension.  Sonographer:    Thurman Coyer RDCS (AE) Referring Phys: [id number] Emeline General IMPRESSIONS  1. Left ventricular ejection fraction, by estimation, is 50 to 55%. The left ventricle has low normal function. The left ventricle has no regional wall motion abnormalities. Left ventricular diastolic parameters were normal.  2. Right ventricular systolic function is normal. The right ventricular size is normal.  3. A small pericardial effusion is present.  4. The mitral valve is normal in structure. Trivial mitral valve regurgitation. No evidence of mitral stenosis.  5. The aortic valve is grossly normal. Aortic valve regurgitation is not visualized. No aortic stenosis is present.  6. The inferior  vena cava is normal in size with greater than 50% respiratory variability, suggesting right atrial pressure of 3 mmHg. Comparison(s): No prior Echocardiogram. Conclusion(s)/Recommendation(s): Otherwise normal echocardiogram, with minor abnormalities described in the report. FINDINGS  Left Ventricle: Left ventricular ejection fraction, by estimation, is 50 to 55%. The left  ventricle has low normal function. The left ventricle has no regional wall motion abnormalities. The left ventricular internal cavity size was normal in size. There is no left ventricular hypertrophy. Left ventricular diastolic parameters were normal. Right Ventricle: The right ventricular size is normal. No increase in right ventricular wall thickness. Right ventricular systolic function is normal. Left Atrium: Left atrial size was normal in size. Right Atrium: Right atrial size was normal in size. Pericardium: A small pericardial effusion is present. Presence of pericardial fat pad. Mitral Valve: The mitral valve is normal in structure. Trivial mitral valve regurgitation. No evidence of mitral valve stenosis. Tricuspid Valve: The tricuspid valve is normal in structure. Tricuspid valve regurgitation is trivial. No evidence of tricuspid stenosis. Aortic Valve: The aortic valve is grossly normal. Aortic valve regurgitation is not visualized. No aortic stenosis is present. Pulmonic Valve: The pulmonic valve was not well visualized. Pulmonic valve regurgitation is not visualized. Aorta: The aortic root, ascending aorta and aortic arch are all structurally normal, with no evidence of dilitation or obstruction. Venous: The inferior vena cava is normal in size with greater than 50% respiratory variability, suggesting right atrial pressure of 3 mmHg. IAS/Shunts: No atrial level shunt detected by color flow Doppler.  LEFT VENTRICLE PLAX 2D LVIDd:         3.50 cm  Diastology LVIDs:         2.60 cm  LV e' medial:    8.49 cm/s LV PW:         0.90 cm  LV E/e'  medial:  8.5 LV IVS:        1.00 cm  LV e' lateral:   8.27 cm/s LVOT diam:     1.90 cm  LV E/e' lateral: 8.8 LV SV:         38 LV SV Index:   24 LVOT Area:     2.84 cm  RIGHT VENTRICLE RV S prime:     19.00 cm/s TAPSE (M-mode): 1.1 cm LEFT ATRIUM             Index       RIGHT ATRIUM          Index LA diam:        2.80 cm 1.81 cm/m  RA Area:     9.62 cm LA Vol (A2C):   28.7 ml 18.57 ml/m RA Volume:   18.00 ml 11.65 ml/m LA Vol (A4C):   24.4 ml 15.79 ml/m LA Biplane Vol: 27.4 ml 17.73 ml/m  AORTIC VALVE LVOT Vmax:   64.20 cm/s LVOT Vmean:  44.500 cm/s LVOT VTI:    0.133 m  AORTA Ao Root diam: 3.10 cm MITRAL VALVE MV Area (PHT): 3.48 cm    SHUNTS MV Decel Time: 218 msec    Systemic VTI:  0.13 m MV E velocity: 72.45 cm/s  Systemic Diam: 1.90 cm MV A velocity: 83.10 cm/s MV E/A ratio:  0.87 Jodelle Red MD Electronically signed by Jodelle Red MD Signature Date/Time: 07/04/2020/6:37:19 PM    Final         Scheduled Meds: . buPROPion  300 mg Oral Daily  . enoxaparin (LOVENOX) injection  40 mg Subcutaneous Q24H  . loratadine  10 mg Oral Daily  . multivitamin with minerals  1 tablet Oral Daily  . pantoprazole (PROTONIX) IV  40 mg Intravenous Q12H  . sucralfate  1 g Oral TID WC & HS  . Vitamin E  800 Units Oral Daily   And  . vitamin E  200 Units Oral Daily   Continuous Infusions: . dextrose 5 % and 0.45% NaCl 75 mL/hr at 07/04/20 2247     LOS: 2 days   Time spent= 35 mins    Rylan Bernard Joline Maxcy, MD [facility]  If 7PM-7AM, please contact night-coverage  07/05/2020, 8:12 AM

## 2020-07-05 NOTE — H&P (View-Only) (Signed)
[facility] ROUNDING NOTE   Subjective: No acute events overnight.  EEG completed and negative/normal.  Tolerating p.o., but still with postprandial abdominal pain.   Objective: Vital signs in last 24 hours: Temp:  [97.8 F (36.6 C)-99.3 F (37.4 C)] 97.8 F (36.6 C) (11/25 0901) Pulse Rate:  [74-97] 76 (11/25 0901) Resp:  [16-18] 17 (11/25 0901) BP: (101-126)/(51-81) 126/81 (11/25 0901) SpO2:  [98 %-100 %] 100 % (11/25 0901) Last BM Date: 07/04/20 General: NAD Ext:  No c/c/e    Intake/Output from previous day: 11/24 0701 - 11/25 0700 In: 400 [I.V.:400] Out: 1500 [Urine:1500] Intake/Output this shift: Total I/O In: -  Out: 2000 [Urine:2000]   Lab Results: Recent Labs    07/03/20 1542 07/04/20 0014 07/05/20 0949  WBC 8.6 7.5 4.9  HGB 13.4 12.0 12.4  PLT 310 262 252  MCV 97.4 96.8 96.5   BMET Recent Labs    07/03/20 1004 07/04/20 0014 07/05/20 0949  NA 141 142 142  K 4.3 4.0 3.3*  CL 106 113* 113*  CO2 24 19* 19*  GLUCOSE 101* 101* 137*  BUN 18 14 9   CREATININE 0.83 0.87 0.76  CALCIUM 9.8 9.0 8.9   LFT Recent Labs    07/03/20 1004 07/04/20 0014 07/05/20 0949  PROT 7.1 6.0* 6.4*  ALBUMIN 4.2 3.6 3.8  AST 21 17 18   ALT 15 14 13   ALKPHOS 63 52 58  BILITOT 0.8 0.7 0.5   PT/INR No results for input(s): INR in the last 72 hours.    Imaging/Other results: EEG  Result Date: 07/03/2020 Charlsie QuestYadav, Priyanka O, MD     07/04/2020  9:17 AM Patient Name: Laural BenesSherry L Oler MRN: [id number] Epilepsy Attending: Charlsie QuestPriyanka O Yadav Referring Physician/Provider: Dietrich PatesHina Khatri, PA Date: 07/03/2020 Duration: 24.05 mins Patient history: 54 year old female with new onset seizure-like episodes.  EEG evaluate for seizures. Level of alertness: Awake AEDs during EEG study: None Technical aspects: This EEG study was done with scalp electrodes positioned according to the 10-20 International system of electrode placement. Electrical activity was acquired at a  sampling rate of 500Hz  and reviewed with a high frequency filter of 70Hz  and a low frequency filter of 1Hz . EEG data were recorded continuously and digitally stored. Description: The posterior dominant rhythm consists of 10 Hz activity of moderate voltage (25-35 uV) seen predominantly in posterior head regions, symmetric and reactive to eye opening and eye closing.  Hyperventilation and photic stimulation were not performed.   IMPRESSION: This study is within normal limits. No seizures or epileptiform discharges were seen throughout the recording. Priyanka Annabelle Harman Yadav   CT ABDOMEN PELVIS WO CONTRAST  Result Date: 07/03/2020 CLINICAL DATA:  Abdominal pain. EXAM: CT ABDOMEN AND PELVIS WITHOUT CONTRAST TECHNIQUE: Multidetector CT imaging of the abdomen and pelvis was performed following the standard protocol without IV contrast. COMPARISON:  June 24, 2018 FINDINGS: Lower chest: Bilateral breast implants are noted. Hepatobiliary: A stable 3.8 cm x 2.8 cm cystic appearing areas seen within the posterior aspect of the liver dome. A stable adjacent subcentimeter cystic appearing lesion is noted. Status post cholecystectomy. No biliary dilatation. Pancreas: Unremarkable. No pancreatic ductal dilatation or surrounding inflammatory changes. Spleen: Normal in size without focal abnormality. Adrenals/Urinary Tract: Adrenal glands are unremarkable. Kidneys are normal, without renal calculi, focal lesion, or hydronephrosis. Bladder is unremarkable. Stomach/Bowel: Stomach is within normal limits. Appendix appears normal. No evidence of bowel wall thickening, distention, or inflammatory changes. Vascular/Lymphatic: No significant vascular findings are present. No enlarged abdominal or pelvic lymph  nodes. Reproductive: Status post hysterectomy. No adnexal masses. Other: No abdominal wall hernia or abnormality. No abdominopelvic ascites. Musculoskeletal: No acute or significant osseous findings. IMPRESSION: 1. Evidence of prior  cholecystectomy and hysterectomy. 2. Stable hepatic cysts. Electronically Signed   By: Aram Candela M.D.   On: 07/03/2020 18:17   MR BRAIN W WO CONTRAST  Result Date: 07/03/2020 CLINICAL DATA:  Possible seizure, history of migraines EXAM: MRI HEAD WITHOUT AND WITH CONTRAST TECHNIQUE: Multiplanar, multiecho pulse sequences of the brain and surrounding structures were obtained without and with intravenous contrast. CONTRAST:  5.58mL GADAVIST GADOBUTROL 1 MMOL/ML IV SOLN COMPARISON:  06/30/2020 FINDINGS: Brain: There is no acute infarction or intracranial hemorrhage. There is no intracranial mass, mass effect, or edema. There is no hydrocephalus or extra-axial fluid collection. Ventricles and sulci are normal in size and configuration. Minimal small foci of T2 hyperintensity are again identified in the supratentorial white matter likely reflecting nonspecific gliosis/demyelination of doubtful clinical significance. No abnormal enhancement. Vascular: Major vessel flow voids at the skull base are preserved. Skull and upper cervical spine: Normal marrow signal is preserved. Sinuses/Orbits: Paranasal sinuses are aerated. Orbits are unremarkable. Other: Sella is unremarkable.  Mastoid air cells are clear. IMPRESSION: No acute abnormality or significant change since 06/30/2020. No abnormal enhancement. Electronically Signed   By: Guadlupe Spanish M.D.   On: 07/03/2020 18:49   Overnight EEG with video  Result Date: 07/04/2020 Charlsie Quest, MD     07/05/2020  9:30 AM Patient Name: ERIKA SLABY MRN: [id number] Epilepsy Attending: Charlsie Quest Referring Physician/Provider: Dr Lindie Spruce Duration: 07/03/2020 1110 to 07/04/2020 1110  Patient history: 54 year old female with new onset seizure-like episodes.  EEG evaluate for seizures.  Level of alertness: Awake, asleep  AEDs during EEG study: None  Technical aspects: This EEG study was done with scalp electrodes positioned according to the 10-20  International system of electrode placement. Electrical activity was acquired at a sampling rate of 500Hz  and reviewed with a high frequency filter of 70Hz  and a low frequency filter of 1Hz . EEG data were recorded continuously and digitally stored.  Description: The posterior dominant rhythm consists of 10 Hz activity of moderate voltage (25-35 uV) seen predominantly in posterior head regions, symmetric and reactive to eye opening and eye closing.  Sleep was characterized by vertex waves, sleep spindles (12 to 14 Hz), maximal frontocentral region.  Hyperventilation and photic stimulation were not performed. Patient had an episode on 07/03/2020 at 1532. Patient was noted to be laying in bed, appeared calm, looking at people but nonverbal.  After couple of minutes, she reported for feeling hot. Concomitant EEG before, during and after the event showed normal posterior dominant rhythm. IMPRESSION: This study is within normal limits. No seizures or epileptiform discharges were seen throughout the recording. One event was recorded on 07/03/2020 at 1532 during which patient was noted to be non verbal.  Concomitant EEG before, during and after the event did not show any EEG changes of the seizure.  This was a NON- epileptic event.    ECHOCARDIOGRAM COMPLETE  Result Date: 07/04/2020    ECHOCARDIOGRAM REPORT   Patient Name:   KALEY JUTRAS Date of Exam: 07/04/2020 Medical Rec #:  07/06/2020            Height:       62.0 in Accession #:    Laural Benes           Weight:  121.3 lb Date of Birth:  30-Dec-1965             BSA:          1.545 m Patient Age:    54 years             BP:           173/37 mmHg Patient Gender: F                    HR:           89 bpm. Exam Location:  Inpatient Procedure: 2D Echo Indications:    Syncope R55  History:        Patient has prior history of Echocardiogram examinations, most                 recent 10/18/2019. Risk Factors:Diabetes and Hypertension.  Sonographer:     Thurman Coyer RDCS (AE) Referring Phys: [id number] Emeline General IMPRESSIONS  1. Left ventricular ejection fraction, by estimation, is 50 to 55%. The left ventricle has low normal function. The left ventricle has no regional wall motion abnormalities. Left ventricular diastolic parameters were normal.  2. Right ventricular systolic function is normal. The right ventricular size is normal.  3. A small pericardial effusion is present.  4. The mitral valve is normal in structure. Trivial mitral valve regurgitation. No evidence of mitral stenosis.  5. The aortic valve is grossly normal. Aortic valve regurgitation is not visualized. No aortic stenosis is present.  6. The inferior vena cava is normal in size with greater than 50% respiratory variability, suggesting right atrial pressure of 3 mmHg. Comparison(s): No prior Echocardiogram. Conclusion(s)/Recommendation(s): Otherwise normal echocardiogram, with minor abnormalities described in the report. FINDINGS  Left Ventricle: Left ventricular ejection fraction, by estimation, is 50 to 55%. The left ventricle has low normal function. The left ventricle has no regional wall motion abnormalities. The left ventricular internal cavity size was normal in size. There is no left ventricular hypertrophy. Left ventricular diastolic parameters were normal. Right Ventricle: The right ventricular size is normal. No increase in right ventricular wall thickness. Right ventricular systolic function is normal. Left Atrium: Left atrial size was normal in size. Right Atrium: Right atrial size was normal in size. Pericardium: A small pericardial effusion is present. Presence of pericardial fat pad. Mitral Valve: The mitral valve is normal in structure. Trivial mitral valve regurgitation. No evidence of mitral valve stenosis. Tricuspid Valve: The tricuspid valve is normal in structure. Tricuspid valve regurgitation is trivial. No evidence of tricuspid stenosis. Aortic Valve: The aortic  valve is grossly normal. Aortic valve regurgitation is not visualized. No aortic stenosis is present. Pulmonic Valve: The pulmonic valve was not well visualized. Pulmonic valve regurgitation is not visualized. Aorta: The aortic root, ascending aorta and aortic arch are all structurally normal, with no evidence of dilitation or obstruction. Venous: The inferior vena cava is normal in size with greater than 50% respiratory variability, suggesting right atrial pressure of 3 mmHg. IAS/Shunts: No atrial level shunt detected by color flow Doppler.  LEFT VENTRICLE PLAX 2D LVIDd:         3.50 cm  Diastology LVIDs:         2.60 cm  LV e' medial:    8.49 cm/s LV PW:         0.90 cm  LV E/e' medial:  8.5 LV IVS:        1.00 cm  LV e' lateral:   8.27  cm/s LVOT diam:     1.90 cm  LV E/e' lateral: 8.8 LV SV:         38 LV SV Index:   24 LVOT Area:     2.84 cm  RIGHT VENTRICLE RV S prime:     19.00 cm/s TAPSE (M-mode): 1.1 cm LEFT ATRIUM             Index       RIGHT ATRIUM          Index LA diam:        2.80 cm 1.81 cm/m  RA Area:     9.62 cm LA Vol (A2C):   28.7 ml 18.57 ml/m RA Volume:   18.00 ml 11.65 ml/m LA Vol (A4C):   24.4 ml 15.79 ml/m LA Biplane Vol: 27.4 ml 17.73 ml/m  AORTIC VALVE LVOT Vmax:   64.20 cm/s LVOT Vmean:  44.500 cm/s LVOT VTI:    0.133 m  AORTA Ao Root diam: 3.10 cm MITRAL VALVE MV Area (PHT): 3.48 cm    SHUNTS MV Decel Time: 218 msec    Systemic VTI:  0.13 m MV E velocity: 72.45 cm/s  Systemic Diam: 1.90 cm MV A velocity: 83.10 cm/s MV E/A ratio:  0.87 Jodelle Red MD Electronically signed by Jodelle Red MD Signature Date/Time: 07/04/2020/6:37:19 PM    Final       Assessment and Plan:  1) Upper abdominal pain 2) Dysphagia 3) Nausea without emesis  -Discussed DDx for upper GI symptoms, to include PUD, gastritis, atypical reflux.  Pain nonfocal and inconsistent with Abdominal Wall Syndrome.  Similarly, pain not cyclic, so do not feel this would be intestinal  migraines. -EGD to evaluate for mucosal/luminal pathology -Can also evaluate dysphagia symptoms at time of EGD with plan for esophageal dilation as appropriate along with biopsies as indicated -plan for EGD on 11/26 -No clear lower GI symptoms and no colonic wall thickening or other abnormalities noted on CT, so do not feel strongly that inpatient colonoscopy is warranted at this juncture -p.o. okay today, n.p.o. at midnight    Shellia Cleverly, DO  07/05/2020, 10:56 AM [facility] Pager [phone]

## 2020-07-05 NOTE — Progress Notes (Signed)
Subjective: No further episodes overnight.  Patient states she is feeling  better.  No new concerns.  ROS: negative except above  Examination  Vital signs in last 24 hours: Temp:  [97.8 F (36.6 C)-99.3 F (37.4 C)] 97.8 F (36.6 C) (11/25 1210) Pulse Rate:  [74-95] 84 (11/25 1210) Resp:  [16-18] 16 (11/25 1210) BP: (101-126)/(51-81) 115/55 (11/25 1210) SpO2:  [98 %-100 %] 100 % (11/25 1210)  General: lying in bed, not in apparent distress CVS: pulse-normal rate and rhythm RS: breathing comfortably, CTA B Extremities: normal, warm  Neuro: MS: Alert, oriented, follows commands CN: pupils equal and reactive,  EOMI, face symmetric, tongue midline, normal sensation over face, Motor: 5/5 strength in all 4 extremities Reflexes: 2+ bilaterally over patella, biceps, plantars: flexor Coordination: normal Gait: not tested  Basic Metabolic Panel: Recent Labs  Lab 06/30/20 1804 06/30/20 1804 06/30/20 1809 07/03/20 0916 07/03/20 0916 07/03/20 1004 07/04/20 0014 07/05/20 0949  NA 143   < > 144 142  --  141 142 142  K 4.1   < > 4.1 4.3  --  4.3 4.0 3.3*  CL 109   < > 110 105  --  106 113* 113*  CO2 24  --   --  27  --  24 19* 19*  GLUCOSE 98   < > 96 107*  --  101* 101* 137*  BUN 14   < > 20 15  --  18 14 9   CREATININE 0.85   < > 0.70 1.04*  --  0.83 0.87 0.76  CALCIUM 9.7   < >  --  10.0   < > 9.8 9.0 8.9  MG  --   --   --   --   --   --   --  2.1   < > = values in this interval not displayed.    CBC: Recent Labs  Lab 06/30/20 1804 06/30/20 1804 06/30/20 1809 07/03/20 0916 07/03/20 1542 07/04/20 0014 07/05/20 0949  WBC 6.1  --   --  6.7 8.6 7.5 4.9  NEUTROABS 1.9  --   --  3.9 4.2 4.6  --   HGB 13.3   < > 13.9 14.5 13.4 12.0 12.4  HCT 42.5   < > 41.0 45.4 41.8 36.8 38.5  MCV 98.2  --   --  97.6 97.4 96.8 96.5  PLT 262  --   --  325 310 262 252   < > = values in this interval not displayed.     Coagulation Studies: No results for input(s): LABPROT, INR in the  last 72 hours.  Imaging No new brain imaging overnight.  ASSESSMENT AND PLAN: 54 year old female with new onset alteration of awareness, speech disturbance.  Nonepileptic spell Headache -We captured a nonepileptic event on video EEG.  Patient reports increased stress recently.  It is likely that increased stress and worsening insomnia with subsequent Ambien use, decreased p.o. intake all led to patient's current symptoms.   Recommendations: -Discontinue LTM EEG as patient did not have any ictal-interictal abnormality. - Discussed the diagnosis of nonepileptic events with patient.  Recommend cognitive behavioral therapy if episodes persist. -Okay to resume topiramate -Recommend holding off Ambien as patient symptoms started afterambien -Recommend seizure precautions including do not drive for 6 months per [facility] law -Discussed plan with patient and husband at bedside  Thank you for allowing [facility] to participate in the care of this patient.  Neurology will sign off.  Please call  us with any further questions.  I have spent a total of 25 minuteswith the patient reviewing hospitalnotes,  test results, labs and examining the patient as well as establishing an assessment and plan that was discussed personally with the patient, her husband.>50% of time was spent in direct patient care.    Lindie Spruce Epilepsy [facility] For questions after 5pm please refer to AMION to reach the Neurologist on call

## 2020-07-05 NOTE — Progress Notes (Signed)
LTM discontinued; minor skin irritation behind A2, Fp1, Fp2, and F4.

## 2020-07-05 NOTE — Discharge Instructions (Signed)
I discussed the underwritten in detail with him.  He verbalized complete understanding.  Per [employer] statutes, patients with seizures are not allowed to drive until they have been seizure-free for six months.   Use caution when using heavy equipment or power tools. Avoid working on ladders or at heights. Take showers instead of baths. Ensure the water temperature is not too high on the home water heater. Do not go swimming alone.  Do not drive until cleared by outpatient primary care physician.  Do not lock yourself in a room alone (i.e. bathroom). When caring for infants or small children, sit down when holding, feeding, or changing them to minimize risk of injury to the child in the event you have a seizure. Maintain good sleep hygiene. Avoid alcohol.   If patienthas another seizure, call 911 and bring them back to the ED if: A. The seizure lasts longer than 5 minutes.  B. The patient doesn't wake shortly after the seizure or has new problems such as difficulty seeing, speaking or moving following the seizure C. The patient was injured during the seizure D. The patient has a temperature over 102 F (39C) E. The patient vomited during the seizure and now is having trouble breathing

## 2020-07-05 NOTE — Procedures (Signed)
Patient Name:Martha Moss BSW:[id number] Epilepsy Attending:Raeley Gilmore Annabelle Harman Referring Physician/Provider:Dr Lindie Spruce Duration: 07/04/2020 1110 to 07/05/2020 0831  Patient history:54 year old female with new onsetseizure-like episodes. EEG evaluate for seizures.  Level of alertness:Awake, asleep  AEDs during EEG study:None  Technical aspects: This EEG study was done with scalp electrodes positioned according to the 10-20 International system of electrode placement. Electrical activity was acquired at a sampling rate of 500Hz  and reviewed with a high frequency filter of 70Hz  and a low frequency filter of 1Hz . EEG data were recorded continuously and digitally stored.   Description: The posterior dominant rhythm consists of 10 Hz activity of moderate voltage (25-35 uV) seen predominantly in posterior head regions, symmetric and reactive to eye opening and eye closing. Sleep was characterized by vertex waves, sleep spindles (12 to 14 Hz), maximal frontocentral regio.  Hyperventilation and photic stimulation were not performed.  IMPRESSION: This study is within normal limits. No seizures or epileptiform discharges were seen throughout the recording.  Oliver Heitzenrater 

## 2020-07-05 NOTE — Progress Notes (Signed)
[facility] ROUNDING NOTE   Subjective: No acute events overnight.  EEG completed and negative/normal.  Tolerating p.o., but still with postprandial abdominal pain.   Objective: Vital signs in last 24 hours: Temp:  [97.8 F (36.6 C)-99.3 F (37.4 C)] 97.8 F (36.6 C) (11/25 0901) Pulse Rate:  [74-97] 76 (11/25 0901) Resp:  [16-18] 17 (11/25 0901) BP: (101-126)/(51-81) 126/81 (11/25 0901) SpO2:  [98 %-100 %] 100 % (11/25 0901) Last BM Date: 07/04/20 General: NAD Ext:  No c/c/e    Intake/Output from previous day: 11/24 0701 - 11/25 0700 In: 400 [I.V.:400] Out: 1500 [Urine:1500] Intake/Output this shift: Total I/O In: -  Out: 2000 [Urine:2000]   Lab Results: Recent Labs    07/03/20 1542 07/04/20 0014 07/05/20 0949  WBC 8.6 7.5 4.9  HGB 13.4 12.0 12.4  PLT 310 262 252  MCV 97.4 96.8 96.5   BMET Recent Labs    07/03/20 1004 07/04/20 0014 07/05/20 0949  NA 141 142 142  K 4.3 4.0 3.3*  CL 106 113* 113*  CO2 24 19* 19*  GLUCOSE 101* 101* 137*  BUN 18 14 9  CREATININE 0.83 0.87 0.76  CALCIUM 9.8 9.0 8.9   LFT Recent Labs    07/03/20 1004 07/04/20 0014 07/05/20 0949  PROT 7.1 6.0* 6.4*  ALBUMIN 4.2 3.6 3.8  AST 21 17 18  ALT 15 14 13  ALKPHOS 63 52 58  BILITOT 0.8 0.7 0.5   PT/INR No results for input(s): INR in the last 72 hours.    Imaging/Other results: EEG  Result Date: 07/03/2020 Yadav, Priyanka O, MD     07/04/2020  9:17 AM Patient Name: Martha Moss MRN: [id number] Epilepsy Attending: Priyanka O Yadav Referring Physician/Provider: Hina Khatri, PA Date: 07/03/2020 Duration: 24.05 mins Patient history: 54-year-old female with new onset seizure-like episodes.  EEG evaluate for seizures. Level of alertness: Awake AEDs during EEG study: None Technical aspects: This EEG study was done with scalp electrodes positioned according to the 10-20 International system of electrode placement. Electrical activity was acquired at a  sampling rate of 500Hz and reviewed with a high frequency filter of 70Hz and a low frequency filter of 1Hz. EEG data were recorded continuously and digitally stored. Description: The posterior dominant rhythm consists of 10 Hz activity of moderate voltage (25-35 uV) seen predominantly in posterior head regions, symmetric and reactive to eye opening and eye closing.  Hyperventilation and photic stimulation were not performed.   IMPRESSION: This study is within normal limits. No seizures or epileptiform discharges were seen throughout the recording. Priyanka O Yadav   CT ABDOMEN PELVIS WO CONTRAST  Result Date: 07/03/2020 CLINICAL DATA:  Abdominal pain. EXAM: CT ABDOMEN AND PELVIS WITHOUT CONTRAST TECHNIQUE: Multidetector CT imaging of the abdomen and pelvis was performed following the standard protocol without IV contrast. COMPARISON:  June 24, 2018 FINDINGS: Lower chest: Bilateral breast implants are noted. Hepatobiliary: A stable 3.8 cm x 2.8 cm cystic appearing areas seen within the posterior aspect of the liver dome. A stable adjacent subcentimeter cystic appearing lesion is noted. Status post cholecystectomy. No biliary dilatation. Pancreas: Unremarkable. No pancreatic ductal dilatation or surrounding inflammatory changes. Spleen: Normal in size without focal abnormality. Adrenals/Urinary Tract: Adrenal glands are unremarkable. Kidneys are normal, without renal calculi, focal lesion, or hydronephrosis. Bladder is unremarkable. Stomach/Bowel: Stomach is within normal limits. Appendix appears normal. No evidence of bowel wall thickening, distention, or inflammatory changes. Vascular/Lymphatic: No significant vascular findings are present. No enlarged abdominal or pelvic lymph   nodes. Reproductive: Status post hysterectomy. No adnexal masses. Other: No abdominal wall hernia or abnormality. No abdominopelvic ascites. Musculoskeletal: No acute or significant osseous findings. IMPRESSION: 1. Evidence of prior  cholecystectomy and hysterectomy. 2. Stable hepatic cysts. Electronically Signed   By: Thaddeus  Houston M.D.   On: 07/03/2020 18:17   MR BRAIN W WO CONTRAST  Result Date: 07/03/2020 CLINICAL DATA:  Possible seizure, history of migraines EXAM: MRI HEAD WITHOUT AND WITH CONTRAST TECHNIQUE: Multiplanar, multiecho pulse sequences of the brain and surrounding structures were obtained without and with intravenous contrast. CONTRAST:  5.5mL GADAVIST GADOBUTROL 1 MMOL/ML IV SOLN COMPARISON:  06/30/2020 FINDINGS: Brain: There is no acute infarction or intracranial hemorrhage. There is no intracranial mass, mass effect, or edema. There is no hydrocephalus or extra-axial fluid collection. Ventricles and sulci are normal in size and configuration. Minimal small foci of T2 hyperintensity are again identified in the supratentorial white matter likely reflecting nonspecific gliosis/demyelination of doubtful clinical significance. No abnormal enhancement. Vascular: Major vessel flow voids at the skull base are preserved. Skull and upper cervical spine: Normal marrow signal is preserved. Sinuses/Orbits: Paranasal sinuses are aerated. Orbits are unremarkable. Other: Sella is unremarkable.  Mastoid air cells are clear. IMPRESSION: No acute abnormality or significant change since 06/30/2020. No abnormal enhancement. Electronically Signed   By: Praneil  Patel M.D.   On: 07/03/2020 18:49   Overnight EEG with video  Result Date: 07/04/2020 Yadav, Priyanka O, MD     07/05/2020  9:30 AM Patient Name: Martha Moss MRN: [id number] Epilepsy Attending: Priyanka O Yadav Referring Physician/Provider: Dr Priyanka Yadav Duration: 07/03/2020 1110 to 07/04/2020 1110  Patient history: 54-year-old female with new onset seizure-like episodes.  EEG evaluate for seizures.  Level of alertness: Awake, asleep  AEDs during EEG study: None  Technical aspects: This EEG study was done with scalp electrodes positioned according to the 10-20  International system of electrode placement. Electrical activity was acquired at a sampling rate of 500Hz and reviewed with a high frequency filter of 70Hz and a low frequency filter of 1Hz. EEG data were recorded continuously and digitally stored.  Description: The posterior dominant rhythm consists of 10 Hz activity of moderate voltage (25-35 uV) seen predominantly in posterior head regions, symmetric and reactive to eye opening and eye closing.  Sleep was characterized by vertex waves, sleep spindles (12 to 14 Hz), maximal frontocentral region.  Hyperventilation and photic stimulation were not performed. Patient had an episode on 07/03/2020 at 1532. Patient was noted to be laying in bed, appeared calm, looking at people but nonverbal.  After couple of minutes, she reported for feeling hot. Concomitant EEG before, during and after the event showed normal posterior dominant rhythm. IMPRESSION: This study is within normal limits. No seizures or epileptiform discharges were seen throughout the recording. One event was recorded on 07/03/2020 at 1532 during which patient was noted to be non verbal.  Concomitant EEG before, during and after the event did not show any EEG changes of the seizure.  This was a NON- epileptic event.  Priyanka O Yadav   ECHOCARDIOGRAM COMPLETE  Result Date: 07/04/2020    ECHOCARDIOGRAM REPORT   Patient Name:   Martha Moss Date of Exam: 07/04/2020 Medical Rec #:  [id number]            Height:       62.0 in Accession #:    [id number]           Weight:         121.3 lb Date of Birth:  10/25/1965             BSA:          1.545 m Patient Age:    54 years             BP:           173/37 mmHg Patient Gender: F                    HR:           89 bpm. Exam Location:  Inpatient Procedure: 2D Echo Indications:    Syncope R55  History:        Patient has prior history of Echocardiogram examinations, most                 recent 10/18/2019. Risk Factors:Diabetes and Hypertension.  Sonographer:     Casey Kirkpatrick RDCS (AE) Referring Phys: [id number] PING T ZHANG IMPRESSIONS  1. Left ventricular ejection fraction, by estimation, is 50 to 55%. The left ventricle has low normal function. The left ventricle has no regional wall motion abnormalities. Left ventricular diastolic parameters were normal.  2. Right ventricular systolic function is normal. The right ventricular size is normal.  3. A small pericardial effusion is present.  4. The mitral valve is normal in structure. Trivial mitral valve regurgitation. No evidence of mitral stenosis.  5. The aortic valve is grossly normal. Aortic valve regurgitation is not visualized. No aortic stenosis is present.  6. The inferior vena cava is normal in size with greater than 50% respiratory variability, suggesting right atrial pressure of 3 mmHg. Comparison(s): No prior Echocardiogram. Conclusion(s)/Recommendation(s): Otherwise normal echocardiogram, with minor abnormalities described in the report. FINDINGS  Left Ventricle: Left ventricular ejection fraction, by estimation, is 50 to 55%. The left ventricle has low normal function. The left ventricle has no regional wall motion abnormalities. The left ventricular internal cavity size was normal in size. There is no left ventricular hypertrophy. Left ventricular diastolic parameters were normal. Right Ventricle: The right ventricular size is normal. No increase in right ventricular wall thickness. Right ventricular systolic function is normal. Left Atrium: Left atrial size was normal in size. Right Atrium: Right atrial size was normal in size. Pericardium: A small pericardial effusion is present. Presence of pericardial fat pad. Mitral Valve: The mitral valve is normal in structure. Trivial mitral valve regurgitation. No evidence of mitral valve stenosis. Tricuspid Valve: The tricuspid valve is normal in structure. Tricuspid valve regurgitation is trivial. No evidence of tricuspid stenosis. Aortic Valve: The aortic  valve is grossly normal. Aortic valve regurgitation is not visualized. No aortic stenosis is present. Pulmonic Valve: The pulmonic valve was not well visualized. Pulmonic valve regurgitation is not visualized. Aorta: The aortic root, ascending aorta and aortic arch are all structurally normal, with no evidence of dilitation or obstruction. Venous: The inferior vena cava is normal in size with greater than 50% respiratory variability, suggesting right atrial pressure of 3 mmHg. IAS/Shunts: No atrial level shunt detected by color flow Doppler.  LEFT VENTRICLE PLAX 2D LVIDd:         3.50 cm  Diastology LVIDs:         2.60 cm  LV e' medial:    8.49 cm/s LV PW:         0.90 cm  LV E/e' medial:  8.5 LV IVS:        1.00 cm  LV e' lateral:   8.27   cm/s LVOT diam:     1.90 cm  LV E/e' lateral: 8.8 LV SV:         38 LV SV Index:   24 LVOT Area:     2.84 cm  RIGHT VENTRICLE RV S prime:     19.00 cm/s TAPSE (M-mode): 1.1 cm LEFT ATRIUM             Index       RIGHT ATRIUM          Index LA diam:        2.80 cm 1.81 cm/m  RA Area:     9.62 cm LA Vol (A2C):   28.7 ml 18.57 ml/m RA Volume:   18.00 ml 11.65 ml/m LA Vol (A4C):   24.4 ml 15.79 ml/m LA Biplane Vol: 27.4 ml 17.73 ml/m  AORTIC VALVE LVOT Vmax:   64.20 cm/s LVOT Vmean:  44.500 cm/s LVOT VTI:    0.133 m  AORTA Ao Root diam: 3.10 cm MITRAL VALVE MV Area (PHT): 3.48 cm    SHUNTS MV Decel Time: 218 msec    Systemic VTI:  0.13 m MV E velocity: 72.45 cm/s  Systemic Diam: 1.90 cm MV A velocity: 83.10 cm/s MV E/A ratio:  0.87 Bridgette Christopher MD Electronically signed by Bridgette Christopher MD Signature Date/Time: 07/04/2020/6:37:19 PM    Final       Assessment and Plan:  1) Upper abdominal pain 2) Dysphagia 3) Nausea without emesis  -Discussed DDx for upper GI symptoms, to include PUD, gastritis, atypical reflux.  Pain nonfocal and inconsistent with Abdominal Wall Syndrome.  Similarly, pain not cyclic, so do not feel this would be intestinal  migraines. -EGD to evaluate for mucosal/luminal pathology -Can also evaluate dysphagia symptoms at time of EGD with plan for esophageal dilation as appropriate along with biopsies as indicated -plan for EGD on 11/26 -No clear lower GI symptoms and no colonic wall thickening or other abnormalities noted on CT, so do not feel strongly that inpatient colonoscopy is warranted at this juncture -p.o. okay today, n.p.o. at midnight    Lamir Racca V Sinead Hockman, DO  07/05/2020, 10:56 AM [facility] Pager [phone]  

## 2020-07-05 NOTE — Evaluation (Signed)
Occupational Therapy Evaluation Patient Details Name: Martha Moss MRN: [id number] DOB: 1965-11-07 Today's Date: 07/05/2020    History of Present Illness 54 y.o female presenting with syncope vs. seizure. Workup has been negative for acute abnormalities. PMH includes complicated migraines, fibromyalgia, GERD   Clinical Impression   PTA pt living with family, functioning at independent community level. At time of eval, pt able to complete bed mobility at supervision assist and sit <> stands with min guard assist for steadying. Pt noted to have poor dynamic balance, walking with high guard and in need of at least 1 UE support. Noted decreased response times, as well as word finding difficulties. MD messaged for SLP consult. Pt then completed shower transfer onto [facility] for seat. Husband present and pt wishing for husband to assist in shower rather than OT. RN also in room and aware pt is showering. Given current status, anticipate pt will progress well without OT follow up. Will continue to follow as acutely admitted to progress ADLs.     Follow Up Recommendations  No OT follow up;Supervision - Intermittent    Equipment Recommendations  Tub/shower seat    Recommendations for Other Services       Precautions / Restrictions Precautions Precautions: Fall Restrictions Weight Bearing Restrictions: No      Mobility Bed Mobility Overal bed mobility: Needs Assistance Bed Mobility: Supine to Sit     Supine to sit: Supervision          Transfers Overall transfer level: Needs assistance Equipment used: 1 person hand held assist Transfers: Sit to/from Stand Sit to Stand: Min guard         General transfer comment: close guard for steadying assist    Balance Overall balance assessment: Needs assistance Sitting-balance support: Feet supported Sitting balance-Leahy Scale: Good     Standing balance support: Single extremity supported;During functional activity Standing  balance-Leahy Scale: Fair Standing balance comment: increased steadiness with 1 HHA. Difficulty tolerating challenge in balance                           ADL either performed or assessed with clinical judgement   ADL Overall ADL's : Needs assistance/impaired Eating/Feeding: Set up;Sitting   Grooming: Set up;Sitting   Upper Body Bathing: Set up;Sitting   Lower Body Bathing: Minimal assistance;Sit to/from stand;Sitting/lateral leans   Upper Body Dressing : Set up;Sitting   Lower Body Dressing: Minimal assistance;Sit to/from stand;Sitting/lateral leans   Toilet Transfer: Min guard;Ambulation;Regular Teacher, adult education Details (indicate cue type and reason): 1HHA for steadying support Toileting- Architect and Hygiene: Set up;Sitting/lateral lean   Tub/ Shower Transfer: Min guard;3 in 1 Tub/Shower Transfer Details (indicate cue type and reason): transferred into shower in room with 1HHA Functional mobility during ADLs: Min guard;Cueing for safety       Vision Baseline Vision/History: No visual deficits Patient Visual Report: No change from baseline Vision Assessment?: No apparent visual deficits Additional Comments: no acute complaints     Perception     Praxis      Pertinent Vitals/Pain Pain Assessment: No/denies pain     Hand Dominance     Extremity/Trunk Assessment Upper Extremity Assessment Upper Extremity Assessment: Generalized weakness   Lower Extremity Assessment Lower Extremity Assessment: Generalized weakness       Communication Communication Communication: Expressive difficulties (slowed response times and word finding difficulties)   Cognition Arousal/Alertness: Awake/alert Behavior During Therapy: WFL for tasks assessed/performed Overall Cognitive Status: Impaired/Different from baseline Area  of Impairment: Problem solving                             Problem Solving: Slow processing;Decreased  initiation General Comments: increased time needed to problem solve basic tasks   General Comments       Exercises     Shoulder Instructions      Home Living Family/patient expects to be discharged to:: Private residence Living Arrangements: Spouse/significant other Available Help at Discharge: Family Type of Home: House                       Home Equipment: None          Prior Functioning/Environment Level of Independence: Independent        Comments: Fully independent. Works for [employer]. has PhD in [place]. Very active        OT Problem List: Decreased strength;Decreased knowledge of use of DME or AE;Decreased activity tolerance;Decreased safety awareness      OT Treatment/Interventions: Self-care/ADL training;Therapeutic exercise;Patient/family education;Balance training;Neuromuscular education;Therapeutic activities;Energy conservation;DME and/or AE instruction    OT Goals(Current goals can be found in the care plan section) Acute Rehab OT Goals Patient Stated Goal: return to baseline independence OT Goal Formulation: With patient Time For Goal Achievement: 07/19/20 Potential to Achieve Goals: Good  OT Frequency: Min 2X/week   Barriers to D/C:            Co-evaluation              AM-PAC OT "6 Clicks" Daily Activity     Outcome Measure Help from another person eating meals?: None Help from another person taking care of personal grooming?: None Help from another person toileting, which includes using toliet, bedpan, or urinal?: A Little Help from another person bathing (including washing, rinsing, drying)?: A Little Help from another person to put on and taking off regular upper body clothing?: None Help from another person to put on and taking off regular lower body clothing?: A Little 6 Click Score: 21   End of Session Equipment Utilized During Treatment: Gait belt Nurse Communication: Mobility status  Activity Tolerance: Patient  tolerated treatment well Patient left: Other (comment) (in shower with husband an RN present)  OT Visit Diagnosis: Unsteadiness on feet (R26.81);Other abnormalities of gait and mobility (R26.89);Other symptoms and signs involving the nervous system (R29.898)                Time: 6063-0160 OT Time Calculation (min): 15 min Charges:  OT General Charges $OT Visit: 1 Visit OT Evaluation $OT Eval Moderate Complexity: 1 Mod  .Dalphine Handing, MSOT, OTR/L Acute Rehabilitation Services [facility] Office Number: [phone] Pager: [phone] Dalphine Handing 07/05/2020, 5:48 PM

## 2020-07-06 ENCOUNTER — Inpatient Hospital Stay (HOSPITAL_COMMUNITY): Payer: Self-pay | Admitting: Anesthesiology

## 2020-07-06 ENCOUNTER — Encounter (HOSPITAL_COMMUNITY): Payer: Self-pay | Admitting: Internal Medicine

## 2020-07-06 ENCOUNTER — Encounter (HOSPITAL_COMMUNITY)
Admission: EM | Disposition: A | Discharge status: Home / Self Care | Payer: Self-pay | Source: Home / Self Care | Attending: Internal Medicine

## 2020-07-06 DIAGNOSIS — R1310 Dysphagia, unspecified: Secondary | ICD-10-CM

## 2020-07-06 HISTORY — PX: MALONEY DILATION: SHX5535

## 2020-07-06 HISTORY — PX: ESOPHAGOGASTRODUODENOSCOPY: SHX5428

## 2020-07-06 HISTORY — PX: BIOPSY: SHX5522

## 2020-07-06 LAB — CBC
HCT: 34.6 % — ABNORMAL LOW (ref 36.0–46.0)
Hemoglobin: 11.2 g/dL — ABNORMAL LOW (ref 12.0–15.0)
MCH: 30.9 pg (ref 26.0–34.0)
MCHC: 32.4 g/dL (ref 30.0–36.0)
MCV: 95.3 fL (ref 80.0–100.0)
Platelets: 239 10*3/uL (ref 150–400)
RBC: 3.63 MIL/uL — ABNORMAL LOW (ref 3.87–5.11)
RDW: 12.7 % (ref 11.5–15.5)
WBC: 5.7 10*3/uL (ref 4.0–10.5)
nRBC: 0 % (ref 0.0–0.2)

## 2020-07-06 LAB — COMPREHENSIVE METABOLIC PANEL
ALT: 13 U/L (ref 0–44)
AST: 13 U/L — ABNORMAL LOW (ref 15–41)
Albumin: 3.4 g/dL — ABNORMAL LOW (ref 3.5–5.0)
Alkaline Phosphatase: 50 U/L (ref 38–126)
Anion gap: 10 (ref 5–15)
BUN: 13 mg/dL (ref 6–20)
CO2: 21 mmol/L — ABNORMAL LOW (ref 22–32)
Calcium: 9.1 mg/dL (ref 8.9–10.3)
Chloride: 112 mmol/L — ABNORMAL HIGH (ref 98–111)
Creatinine, Ser: 0.86 mg/dL (ref 0.44–1.00)
GFR, Estimated: >60 mL/min (ref >60)
Glucose, Bld: 102 mg/dL — ABNORMAL HIGH (ref 70–99)
Potassium: 3.5 mmol/L (ref 3.5–5.1)
Sodium: 143 mmol/L (ref 135–145)
Total Bilirubin: 0.4 mg/dL (ref 0.3–1.2)
Total Protein: 6 g/dL — ABNORMAL LOW (ref 6.5–8.1)

## 2020-07-06 LAB — MAGNESIUM: Magnesium: 2 mg/dL (ref 1.7–2.4)

## 2020-07-06 LAB — GLUCOSE, CAPILLARY
Glucose-Capillary: 88 mg/dL (ref 70–99)
Glucose-Capillary: 83 mg/dL (ref 70–99)

## 2020-07-06 SURGERY — EGD (ESOPHAGOGASTRODUODENOSCOPY)
Anesthesia: Monitor Anesthesia Care | Laterality: Left

## 2020-07-06 MED ORDER — PROPOFOL 500 MG/50ML IV EMUL
INTRAVENOUS | Status: DC | PRN
  Administered 2020-07-06: 125 ug/kg/min via INTRAVENOUS

## 2020-07-06 MED ORDER — PHENYLEPHRINE HCL (PRESSORS) 10 MG/ML IV SOLN
INTRAVENOUS | Status: DC | PRN
  Administered 2020-07-06: 120 ug via INTRAVENOUS

## 2020-07-06 MED ORDER — PROPOFOL 10 MG/ML IV BOLUS
INTRAVENOUS | Status: DC | PRN
  Administered 2020-07-06 (×2): 30 mg via INTRAVENOUS
  Administered 2020-07-06 (×2): 20 mg via INTRAVENOUS

## 2020-07-06 MED ORDER — PANTOPRAZOLE SODIUM 40 MG PO TBEC
40.0000 mg | DELAYED_RELEASE_TABLET | Freq: Every day | ORAL | 2 refills | Status: DC
Start: 2020-07-06 — End: 2020-10-09

## 2020-07-06 MED ORDER — MIDAZOLAM HCL 2 MG/2ML IJ SOLN
INTRAMUSCULAR | Status: DC | PRN
  Administered 2020-07-06: 2 mg via INTRAVENOUS

## 2020-07-06 MED ORDER — GLYCOPYRROLATE 0.2 MG/ML IJ SOLN
INTRAMUSCULAR | Status: DC | PRN
  Administered 2020-07-06 (×2): .1 mg via INTRAVENOUS

## 2020-07-06 MED ORDER — LIDOCAINE HCL (CARDIAC) PF 100 MG/5ML IV SOSY
PREFILLED_SYRINGE | INTRAVENOUS | Status: DC | PRN
  Administered 2020-07-06: 80 mg via INTRATRACHEAL

## 2020-07-06 MED ORDER — SODIUM CHLORIDE 0.9 % IV SOLN: INTRAVENOUS | Status: DC | PRN

## 2020-07-06 MED ORDER — POTASSIUM CHLORIDE CRYS ER 20 MEQ PO TBCR
40.0000 meq | EXTENDED_RELEASE_TABLET | Freq: Once | ORAL | Status: AC
  Administered 2020-07-06: 40 meq via ORAL
  Filled 2020-07-06: qty 2

## 2020-07-06 NOTE — Op Note (Signed)
[facility] Patient Name: Martha Moss Procedure Date : 07/06/2020 MRN: [id number] Attending MD: Doristine Locks , MD Date of Birth: 12-15-1965 CSN: [id number] Age: 54 Admit Type: Inpatient Procedure:                Upper GI endoscopy Indications:              Upper abdominal pain, Dysphagia, Nausea Providers:                Doristine Locks, MD, Adolph Pollack, RN, Harrington Challenger, Technician, Rosilyn Mings, Technician,                            Janann Colonel Referring MD:              Medicines:                Monitored Anesthesia Care Complications:            No immediate complications. Estimated Blood Loss:     Estimated blood loss was minimal. Procedure:                Pre-Anesthesia Assessment:                           - Prior to the procedure, a History and Physical                            was performed, and patient medications and                            allergies were reviewed. The patient's tolerance of                            previous anesthesia was also reviewed. The risks                            and benefits of the procedure and the sedation                            options and risks were discussed with the patient.                            All questions were answered, and informed consent                            was obtained. Prior Anticoagulants: The patient has                            taken no previous anticoagulant or antiplatelet                            agents. ASA Grade Assessment: II - A patient with  mild systemic disease. After reviewing the risks                            and benefits, the patient was deemed in                            satisfactory condition to undergo the procedure.                           After obtaining informed consent, the endoscope was                            passed under direct vision. Throughout the                             procedure, the patient's blood pressure, pulse, and                            oxygen saturations were monitored continuously. The                            GIF-H190 ([id number]) Olympus gastroscope was                            introduced through the mouth, and advanced to the                            second part of duodenum. The upper GI endoscopy was                            accomplished without difficulty. The patient                            tolerated the procedure well. Scope In: Scope Out: Findings:      The examined esophagus was normal. The scope was withdrawn. Dilation was       performed with a Maloney dilator with mild resistance at 54 Fr. The       dilation site was examined following endoscope reinsertion and showed       mild mucosal disruption at 18 cm, consistent with successful dilation of       the upper esophagus. Estimated blood loss was minimal. Biopsies were       then obtained from the proximal and distal esophagus with cold forceps       for histology of suspected eosinophilic esophagitis. Estimated blood       loss was minimal.      The Z-line was regular and was found 40 cm from the incisors.      The entire examined stomach was normal. Biopsies were taken with a cold       forceps for Helicobacter pylori testing. Estimated blood loss was       minimal.      The examined duodenum was normal. Biopsies for histology were taken with       a cold forceps for evaluation of celiac disease. Estimated blood loss       was minimal. Impression:               -  Normal esophagus. Dilated. Biopsied.                           - Z-line regular, 40 cm from the incisors.                           - Normal stomach. Biopsied.                           - Normal examined duodenum. Biopsied. Recommendation:           - Return patient to hospital ward for possible                            discharge same day.                           - Soft diet today then advance diet as  tolerated                            tomorrow.                           - Continue present medications.                           - Await pathology results.                           - GI service will sign off at this time. Please do                            not hesitate to contact with additional questions                            or concerns. Will arrange for follow-up in the GI                            clinic as an outpatient. Procedure Code(s):        --- Professional ---                           [id number], Esophagogastroduodenoscopy, flexible,                            transoral; with biopsy, single or multiple                           43450, Dilation of esophagus, by unguided sound or                            bougie, single or multiple passes Diagnosis Code(s):        --- Professional ---                           R10.10, Upper abdominal pain, unspecified  R13.10, Dysphagia, unspecified                           R11.0, Nausea CPT copyright 2019 American Medical Association. All rights reserved. The codes documented in this report are preliminary and upon coder review may  be revised to meet current compliance requirements. Doristine LocksVito Rogelio Waynick, MD 07/06/2020 8:44:55 AM Number of Addenda: 0

## 2020-07-06 NOTE — Anesthesia Procedure Notes (Signed)
Procedure Name: MAC Date/Time: 07/06/2020 8:13 AM Performed by: Kathryne Hitch, CRNA Pre-anesthesia Checklist: Patient identified, Emergency Drugs available, Suction available and Patient being monitored Patient Re-evaluated:Patient Re-evaluated prior to induction Oxygen Delivery Method: Nasal cannula Preoxygenation: Pre-oxygenation with 100% oxygen Induction Type: IV induction Dental Injury: Teeth and Oropharynx as per pre-operative assessment

## 2020-07-06 NOTE — TOC Transition Note (Signed)
Transition of Care [facility]) - CM/SW Discharge Note   Patient Details  Name: Martha Moss MRN: [id number] Date of Birth: 12-Oct-1965  [facility] CM/SW Contact:  Kermit Balo, RN Phone Number: 07/06/2020, 12:47 PM   Clinical Narrative:    Pt is discharging home with family and will participate in [facility] outpatient therapy. Information on the AVS.  Pt has PCP. Walker for home to be delivered to the room per [employer].  Email sent to financial counseling per family request for follow up on bills.  Family to provide transport home.  Final next level of care: OP Rehab Barriers to Discharge: Inadequate or no insurance, Barriers Unresolved (comment)   Patient Goals and CMS Choice        Discharge Placement                       Discharge Plan and Services                                     Social Determinants of Health (SDOH) Interventions     Readmission Risk Interventions No flowsheet data found.

## 2020-07-06 NOTE — Interval H&P Note (Signed)
History and Physical Interval Note:  07/06/2020 7:37 AM  Martha Moss  has presented today for surgery, with the diagnosis of Dysphagia, RUQ pain, nausea.  The various methods of treatment have been discussed with the patient and family. After consideration of risks, benefits and other options for treatment, the patient has consented to  Procedure(s): ESOPHAGOGASTRODUODENOSCOPY (EGD) (Left) as a surgical intervention.  The patient's history has been reviewed, patient examined, no change in status, stable for surgery.  I have reviewed the patient's chart and labs.  Questions were answered to the patient's satisfaction.     Verlin Dike Curvin Hunger

## 2020-07-06 NOTE — Plan of Care (Signed)
Adequate for discharge.

## 2020-07-06 NOTE — Anesthesia Postprocedure Evaluation (Signed)
Anesthesia Post Note  Patient: Martha Moss  Procedure(s) Performed: ESOPHAGOGASTRODUODENOSCOPY (EGD) (Left ) [place]     Patient location during evaluation: Endoscopy Anesthesia Type: MAC Level of consciousness: awake and alert Pain management: pain level controlled Vital Signs Assessment: post-procedure vital signs reviewed and stable Respiratory status: spontaneous breathing, nonlabored ventilation, respiratory function stable and patient connected to nasal cannula oxygen Cardiovascular status: blood pressure returned to baseline and stable Postop Assessment: no apparent nausea or vomiting Anesthetic complications: no   No complications documented.  Last Vitals:  Vitals:   07/06/20 0859 07/06/20 0922  BP:  136/69  Pulse: 91 72  Resp: 16 16  Temp:  36.4 C  SpO2: 100% 100%    Last Pain:  Vitals:   07/06/20 0922  TempSrc: Oral  PainSc:                  [place] S

## 2020-07-06 NOTE — Transfer of Care (Signed)
Immediate Anesthesia Transfer of Care Note  Patient: Martha Moss  Procedure(s) Performed: ESOPHAGOGASTRODUODENOSCOPY (EGD) (Left ) [place]  Patient Location: Endoscopy Unit  Anesthesia Type:MAC  Level of Consciousness: drowsy and patient cooperative  Airway & Oxygen Therapy: Patient Spontanous Breathing and Patient connected to nasal cannula oxygen  Post-op Assessment: Report given to RN and Post -op Vital signs reviewed and stable  Post vital signs: Reviewed and stable  Last Vitals:  Vitals Value Taken Time  BP 123/63 07/06/20 0839  Temp    Pulse 91 07/06/20 0839  Resp 20 07/06/20 0839  SpO2 100 % 07/06/20 0839  Vitals shown include unvalidated device data.  Last Pain:  Vitals:   07/06/20 0839  TempSrc:   PainSc: 0-No pain         Complications: No complications documented.

## 2020-07-06 NOTE — Discharge Summary (Signed)
Physician Discharge Summary  Martha Moss:[id number] DOB: Jul 16, 1966 DOA: 07/03/2020  PCP: Sheliah Hatch, MD  Admit date: 07/03/2020 Discharge date: 07/06/2020  Admitted From: Home Disposition: Home  Recommendations for Outpatient Follow-up:  1. Follow up with PCP in 1-2 weeks 2. Please obtain BMP/CBC in one week your next doctors visit.  3. Protonix 40 mg daily taken 30 minutes before breakfast daily 4. Follow-up outpatient gastroenterology, to be arranged by their service. 5. Advised to avoid certain foods such as spicy food, caffeine, dark chocolate and anything else that exacerbates reflux disease 6. Biopsy results will be conveyed by GI team 7. Advised to follow-up with outpatient behavioral therapy/psychologist to help with her anxiety 8. Outpatient follow-up with speech therapy   Discharge Condition: Stable CODE STATUS: Full code Diet recommendation: Soft diet for next 24-48 hours thereafter she can resume regular diet  Brief/Interim Summary: 54 year old with history of chronic migraine, fibromyalgia, GERD admitted for concerns of syncope versus seizures.  Recently she has been having speech difficulty for which he presented to the ER few days prior to admission and her MRI brain was negative and discharged home.  After going home continued to have headache and nausea also became unresponsive at her PCPs office therefore sent to the ER.  Neurology team consulted and performing EEG.  Appeared to have nonepileptic activity while being on seizure therefore concerns of pseudoseizure as she has been stressful.  GI team planning on endoscopic evaluation once cleared by neurology.   Assessment & Plan:   Active Problems:   Seizure (HCC)   Hypotension   Esophageal dysphagia   Right upper quadrant abdominal pain   Nausea without vomiting  Syncope with concern for possible seizures Atypical migraine -Concerns for possible pseudoseizures induced by stress.  She  will need outpatient follow-up with psychologist.  Family will make this arrangement. -MRI brain without contrast-negative -Echocardiogram-EF 55%, mild pericardial effusion overall unremarkable -TSH and cortisol-normal -Alcohol, UDS-negative -Questionable prolonged QTC on admission but repeat EKG 11/24-normal QTC. -Seen by neurology.  Epigastric abdominal pain with nausea -Appreciate GI input, tentative plans for endoscopic evaluation 11/26 once cleared by neurology -Status post EGD on 11/26, proximal esophageal slightly dilated.  Otherwise normal endoscopy.  Biopsies taken will be followed up outpatient.  Outpatient follow-up will be arranged by GI.  Advised soft diet for next 24 hours thereafter she can resume regular diet. -CT abdomen pelvis without contrast-negative for acute pathology  Lactic acidosis -Resolved  History of anxiety/depression -Wellbutrin 300 mg daily  GERD -PPI daily before breakfast   Body mass index is 22.18 kg/m.      Discharge Diagnoses:  Active Problems:   Seizure (HCC)   Hypotension   Esophageal dysphagia   Right upper quadrant abdominal pain   Nausea without vomiting      Consultations:  Neurology  Gastroenterology  Subjective: Seen after endoscopy this morning therefore reporting of some sore throat otherwise no complaints.  Still has some dysarthric speech which she will continue to work with speech therapy.  Husband present at bedside as well, all the questions answered.  All instructions have been given by me verbally to both the patient and her husband.  Discharge Exam: Vitals:   07/06/20 0859 07/06/20 0922  BP:  136/69  Pulse: 91 72  Resp: 16 16  Temp:  97.6 F (36.4 C)  SpO2: 100% 100%   Vitals:   07/06/20 0839 07/06/20 0849 07/06/20 0859 07/06/20 0922  BP: 123/63 140/68  136/69  Pulse: 91 95  91 72  Resp: 20 16 16 16   Temp: 97.9 F (36.6 C)   97.6 F (36.4 C)  TempSrc: Oral   Oral  SpO2: 100% 100% 100% 100%   Weight:      Height:        General: Pt is alert, awake, not in acute distress Cardiovascular: RRR, S1/S2 +, no rubs, no gallops Respiratory: CTA bilaterally, no wheezing, no rhonchi Abdominal: Soft, NT, ND, bowel sounds + Extremities: no edema, no cyanosis Mild dysarthric speech  Discharge Instructions   Allergies as of 07/06/2020      Reactions   Bee Venom    Azithromycin    REACTION: hives   Diphenhydramine Hcl    REACTION: hives   Erythromycin    REACTION: hives   Latex    REACTION: rash   Morphine And Related Nausea And Vomiting   Penicillins    REACTION: rash, fever   Percocet [oxycodone-acetaminophen] Other (See Comments)   Hallucinations   Sulfonamide Derivatives    REACTION: rash, fever   Tetracyclines & Related       Medication List    TAKE these medications   buPROPion 300 MG 24 hr tablet Commonly known as: WELLBUTRIN XL TAKE 1 TABLET BY MOUTH DAILY   fexofenadine 180 MG tablet Commonly known as: ALLEGRA Take 180 mg by mouth daily.   levocetirizine 5 MG tablet Commonly known as: XYZAL Take 5 mg by mouth every evening.   linaclotide 72 MCG capsule Commonly known as: Linzess Take 1 capsule (72 mcg total) by mouth daily before breakfast.   magnesium 30 MG tablet Take 30 mg by mouth every other day.   multivitamin with minerals Tabs tablet Take 1 tablet by mouth daily.   pantoprazole 40 MG tablet Commonly known as: PROTONIX Take 1 tablet (40 mg total) by mouth daily before breakfast. What changed: when to take this   sucralfate 1 g tablet Commonly known as: Carafate Take 1 tablet (1 g total) by mouth 4 (four) times daily -  with meals and at bedtime.   topiramate 200 MG tablet Commonly known as: TOPAMAX TAKE 1 TABLET BY MOUTH TWICE DAILY What changed:   how much to take  when to take this   triamcinolone 55 MCG/ACT Aero nasal inhaler Commonly known as: NASACORT Place 2 sprays into the nose daily.   vitamin E 1000 UNIT  capsule Take 1,000 Units by mouth daily.       Follow-up Information    07/08/2020, MD. Schedule an appointment as soon as possible for a visit in 1 week(s).   Specialty: Family Medicine Contact information: [phone] [place] [phone]              Allergies  Allergen Reactions   Bee Venom    Azithromycin     REACTION: hives   Diphenhydramine Hcl     REACTION: hives   Erythromycin     REACTION: hives   Latex     REACTION: rash   Morphine And Related Nausea And Vomiting   Penicillins     REACTION: rash, fever   Percocet [Oxycodone-Acetaminophen] Other (See Comments)    Hallucinations    Sulfonamide Derivatives     REACTION: rash, fever   Tetracyclines & Related     You were cared for by a hospitalist during your hospital stay. If you have any questions about your discharge medications or the care you received while you were in the hospital after you  are discharged, you can call the unit and asked to speak with the hospitalist on call if the hospitalist that took care of you is not available. Once you are discharged, your primary care physician will handle any further medical issues. Please note that no refills for any discharge medications will be authorized once you are discharged, as it is imperative that you return to your primary care physician (or establish a relationship with a primary care physician if you do not have one) for your aftercare needs so that they can reassess your need for medications and monitor your lab values.   Procedures/Studies: EEG  Result Date: 07/03/2020 Charlsie Quest, MD     07/04/2020  9:17 AM Patient Name: Martha Moss MRN: [id number] Epilepsy Attending: Charlsie Quest Referring Physician/Provider: Dietrich Pates, PA Date: 07/03/2020 Duration: 24.05 mins Patient history: 54 year old female with new onset seizure-like episodes.  EEG evaluate for seizures. Level of alertness: Awake  AEDs during EEG study: None Technical aspects: This EEG study was done with scalp electrodes positioned according to the 10-20 International system of electrode placement. Electrical activity was acquired at a sampling rate of 500Hz  and reviewed with a high frequency filter of 70Hz  and a low frequency filter of 1Hz . EEG data were recorded continuously and digitally stored. Description: The posterior dominant rhythm consists of 10 Hz activity of moderate voltage (25-35 uV) seen predominantly in posterior head regions, symmetric and reactive to eye opening and eye closing.  Hyperventilation and photic stimulation were not performed.   IMPRESSION: This study is within normal limits. No seizures or epileptiform discharges were seen throughout the recording. Priyanka   CT ABDOMEN PELVIS WO CONTRAST  Result Date: 07/03/2020 CLINICAL DATA:  Abdominal pain. EXAM: CT ABDOMEN AND PELVIS WITHOUT CONTRAST TECHNIQUE: Multidetector CT imaging of the abdomen and pelvis was performed following the standard protocol without IV contrast. COMPARISON:  June 24, 2018 FINDINGS: Lower chest: Bilateral breast implants are noted. Hepatobiliary: A stable 3.8 cm x 2.8 cm cystic appearing areas seen within the posterior aspect of the liver dome. A stable adjacent subcentimeter cystic appearing lesion is noted. Status post cholecystectomy. No biliary dilatation. Pancreas: Unremarkable. No pancreatic ductal dilatation or surrounding inflammatory changes. Spleen: Normal in size without focal abnormality. Adrenals/Urinary Tract: Adrenal glands are unremarkable. Kidneys are normal, without renal calculi, focal lesion, or hydronephrosis. Bladder is unremarkable. Stomach/Bowel: Stomach is within normal limits. Appendix appears normal. No evidence of bowel wall thickening, distention, or inflammatory changes. Vascular/Lymphatic: No significant vascular findings are present. No enlarged abdominal or pelvic lymph nodes. Reproductive:  Status post hysterectomy. No adnexal masses. Other: No abdominal wall hernia or abnormality. No abdominopelvic ascites. Musculoskeletal: No acute or significant osseous findings. IMPRESSION: 1. Evidence of prior cholecystectomy and hysterectomy. 2. Stable hepatic cysts. Electronically Signed   By: Annabelle Harman M.D.   On: 07/03/2020 18:17   MR BRAIN WO CONTRAST  Result Date: 06/30/2020 CLINICAL DATA:  Abnormal speech EXAM: MRI HEAD WITHOUT CONTRAST TECHNIQUE: Multiplanar, multiecho pulse sequences of the brain and surrounding structures were obtained without intravenous contrast. COMPARISON:  None. FINDINGS: Brain: There is no acute infarction or intracranial hemorrhage. There is no intracranial mass, mass effect, or edema. There is no hydrocephalus or extra-axial fluid collection. Minimal small foci of T2 hyperintensity in the supratentorial white matter likely reflect nonspecific gliosis/demyelination of doubtful clinical significance. Ventricles and sulci are normal in size and configuration. Vascular: Major vessel flow voids at the skull base are preserved. Skull and upper cervical  spine: Normal marrow signal is preserved. Sinuses/Orbits: Paranasal sinuses are aerated. Orbits are unremarkable. Other: Sella is unremarkable.  Mastoid air cells are clear. IMPRESSION: No evidence of recent infarction, hemorrhage, or mass. Electronically Signed   By: Praneil  Patel M.D.   On: 06/30/2020 19:04   MR BRAIN W WO CONTRAST  Result Date: 07/03/2020 CLINICAL DATA:  Possible seizure,[place] Spanish history of migraines EXAM: MRI HEAD WITHOUT AND WITH CONTRAST TECHNIQUE: Multiplanar, multiecho pulse sequences of the brain and surrounding structures were obtained without and with intravenous contrast. CONTRAST:  5.205mL GADAVIST GADOBUTROL 1 MMOL/ML IV SOLN COMPARISON:  06/30/2020 FINDINGS: Brain: There is no acute infarction or intracranial hemorrhage. There is no intracranial mass, mass effect, or edema. There is no  hydrocephalus or extra-axial fluid collection. Ventricles and sulci are normal in size and configuration. Minimal small foci of T2 hyperintensity are again identified in the supratentorial white matter likely reflecting nonspecific gliosis/demyelination of doubtful clinical significance. No abnormal enhancement. Vascular: Major vessel flow voids at the skull base are preserved. Skull and upper cervical spine: Normal marrow signal is preserved. Sinuses/Orbits: Paranasal sinuses are aerated. Orbits are unremarkable. Other: Sella is unremarkable.  Mastoid air cells are clear. IMPRESSION: No acute abnormality or significant change since 06/30/2020. No abnormal enhancement. Electronically Signed   By: Guadlupe SpanishPraneil  Patel M.D.   On: 07/03/2020 18:49   Overnight EEG with video  Result Date: 07/04/2020 Charlsie QuestYadav, Priyanka O, MD     07/05/2020  9:30 AM Patient Name: Martha BenesSherry L Ditullio MRN: [id number] Epilepsy Attending: Charlsie QuestPriyanka O Yadav Referring Physician/Provider: Dr Lindie SprucePriyanka Yadav Duration: 07/03/2020 1110 to 07/04/2020 1110  Patient history: 54 year old female with new onset seizure-like episodes.  EEG evaluate for seizures.  Level of alertness: Awake, asleep  AEDs during EEG study: None  Technical aspects: This EEG study was done with scalp electrodes positioned according to the 10-20 International system of electrode placement. Electrical activity was acquired at a sampling rate of 500Hz  and reviewed with a high frequency filter of 70Hz  and a low frequency filter of 1Hz . EEG data were recorded continuously and digitally stored.  Description: The posterior dominant rhythm consists of 10 Hz activity of moderate voltage (25-35 uV) seen predominantly in posterior head regions, symmetric and reactive to eye opening and eye closing.  Sleep was characterized by vertex waves, sleep spindles (12 to 14 Hz), maximal frontocentral region.  Hyperventilation and photic stimulation were not performed. Patient had an episode on  07/03/2020 at 1532. Patient was noted to be laying in bed, appeared calm, looking at people but nonverbal.  After couple of minutes, she reported for feeling hot. Concomitant EEG before, during and after the event showed normal posterior dominant rhythm. IMPRESSION: This study is within normal limits. No seizures or epileptiform discharges were seen throughout the recording. One event was recorded on 07/03/2020 at 1532 during which patient was noted to be non verbal.  Concomitant EEG before, during and after the event did not show any EEG changes of the seizure.  This was a NON- epileptic event.  Charlsie Questriyanka O Yadav   ECHOCARDIOGRAM COMPLETE  Result Date: 07/04/2020    ECHOCARDIOGRAM REPORT   Patient Name:   Martha BenesSHERRY L Rogel Date of Exam: 07/04/2020 Medical Rec #:  [id number]            Height:       62.0 in Accession #:    [id number]           Weight:       121.3 lb Date of  Birth:  [birth year]             BSA:          1.545 m Patient Age:    54 years             BP:           173/37 mmHg Patient Gender: F                    HR:           89 bpm. Exam Location:  Inpatient Procedure: 2D Echo Indications:    Syncope R55  History:        Patient has prior history of Echocardiogram examinations, most                 recent 10/18/2019. Risk Factors:Diabetes and Hypertension.  Sonographer:    Thurman Coyer RDCS (AE) Referring Phys: [id number] Emeline General IMPRESSIONS  1. Left ventricular ejection fraction, by estimation, is 50 to 55%. The left ventricle has low normal function. The left ventricle has no regional wall motion abnormalities. Left ventricular diastolic parameters were normal.  2. Right ventricular systolic function is normal. The right ventricular size is normal.  3. A small pericardial effusion is present.  4. The mitral valve is normal in structure. Trivial mitral valve regurgitation. No evidence of mitral stenosis.  5. The aortic valve is grossly normal. Aortic valve regurgitation is not visualized. No  aortic stenosis is present.  6. The inferior vena cava is normal in size with greater than 50% respiratory variability, suggesting right atrial pressure of 3 mmHg. Comparison(s): No prior Echocardiogram. Conclusion(s)/Recommendation(s): Otherwise normal echocardiogram, with minor abnormalities described in the report. FINDINGS  Left Ventricle: Left ventricular ejection fraction, by estimation, is 50 to 55%. The left ventricle has low normal function. The left ventricle has no regional wall motion abnormalities. The left ventricular internal cavity size was normal in size. There is no left ventricular hypertrophy. Left ventricular diastolic parameters were normal. Right Ventricle: The right ventricular size is normal. No increase in right ventricular wall thickness. Right ventricular systolic function is normal. Left Atrium: Left atrial size was normal in size. Right Atrium: Right atrial size was normal in size. Pericardium: A small pericardial effusion is present. Presence of pericardial fat pad. Mitral Valve: The mitral valve is normal in structure. Trivial mitral valve regurgitation. No evidence of mitral valve stenosis. Tricuspid Valve: The tricuspid valve is normal in structure. Tricuspid valve regurgitation is trivial. No evidence of tricuspid stenosis. Aortic Valve: The aortic valve is grossly normal. Aortic valve regurgitation is not visualized. No aortic stenosis is present. Pulmonic Valve: The pulmonic valve was not well visualized. Pulmonic valve regurgitation is not visualized. Aorta: The aortic root, ascending aorta and aortic arch are all structurally normal, with no evidence of dilitation or obstruction. Venous: The inferior vena cava is normal in size with greater than 50% respiratory variability, suggesting right atrial pressure of 3 mmHg. IAS/Shunts: No atrial level shunt detected by color flow Doppler.  LEFT VENTRICLE PLAX 2D LVIDd:         3.50 cm  Diastology LVIDs:         2.60 cm  LV e' medial:     8.49 cm/s LV PW:         0.90 cm  LV E/e' medial:  8.5 LV IVS:        1.00 cm  LV e' lateral:   8.27 cm/s LVOT diam:  1.90 cm  LV E/e' lateral: 8.8 LV SV:         38 LV SV Index:   24 LVOT Area:     2.84 cm  RIGHT VENTRICLE RV S prime:     19.00 cm/s TAPSE (M-mode): 1.1 cm LEFT ATRIUM             Index       RIGHT ATRIUM          Index LA diam:        2.80 cm 1.81 cm/m  RA Area:     9.62 cm LA Vol (A2C):   28.7 ml 18.57 ml/m RA Volume:   18.00 ml 11.65 ml/m LA Vol (A4C):   24.4 ml 15.79 ml/m LA Biplane Vol: 27.4 ml 17.73 ml/m  AORTIC VALVE LVOT Vmax:   64.20 cm/s LVOT Vmean:  44.500 cm/s LVOT VTI:    0.133 m  AORTA Ao Root diam: 3.10 cm MITRAL VALVE MV Area (PHT): 3.48 cm    SHUNTS MV Decel Time: 218 msec    Systemic VTI:  0.13 m MV E velocity: 72.45 cm/s  Systemic Diam: 1.90 cm MV A velocity: 83.10 cm/s MV E/A ratio:  0.87 Jodelle Red MD Electronically signed by Jodelle Red MD Signature Date/Time: 07/04/2020/6:37:19 PM    Final       The results of significant diagnostics from this hospitalization (including imaging, microbiology, ancillary and laboratory) are listed below for reference.     Microbiology: Recent Results (from the past 240 hour(s))  Respiratory Panel by RT PCR (Flu A&B, Covid) - Nasopharyngeal Swab     Status: None   Collection Time: 07/03/20 10:02 AM   Specimen: Nasopharyngeal Swab; Nasopharyngeal(NP) swabs in vial transport medium  Result Value Ref Range Status   SARS Coronavirus 2 by RT PCR NEGATIVE NEGATIVE Final    Comment: (NOTE) SARS-CoV-2 target nucleic acids are NOT DETECTED.  The SARS-CoV-2 RNA is generally detectable in upper respiratoy specimens during the acute phase of infection. The lowest concentration of SARS-CoV-2 viral copies this assay can detect is 131 copies/mL. A negative result does not preclude SARS-Cov-2 infection and should not be used as the sole basis for treatment or other patient management decisions. A negative  result may occur with  improper specimen collection/handling, submission of specimen other than nasopharyngeal swab, presence of viral mutation(s) within the areas targeted by this assay, and inadequate number of viral copies (<131 copies/mL). A negative result must be combined with clinical observations, patient history, and epidemiological information. The expected result is Negative.  Fact Sheet for Patients:  https://www.moore.com/  Fact Sheet for Healthcare Providers:  https://www.young.biz/  This test is no t yet approved or cleared by the [place] FDA and  has been authorized for detection and/or diagnosis of SARS-CoV-2 by FDA under an Emergency Use Authorization (EUA). This EUA will remain  in effect (meaning this test can be used) for the duration of the COVID-19 declaration under Section 564(b)(1) of the Act, 21 U.S.C. section 360bbb-3(b)(1), unless the authorization is terminated or revoked sooner.     Influenza A by PCR NEGATIVE NEGATIVE Final   Influenza B by PCR NEGATIVE NEGATIVE Final    Comment: (NOTE) The Xpert Xpress SARS-CoV-2/FLU/RSV assay is intended as an aid in  the diagnosis of influenza from Nasopharyngeal swab specimens and  should not be used as a sole basis for treatment. Nasal washings and  aspirates are unacceptable for Xpert Xpress SARS-CoV-2/FLU/RSV  testing.  Fact Sheet for Patients:  https://www.moore.com/  Fact Sheet for Healthcare Providers: https://www.young.biz/  This test is not yet approved or cleared by the [place] FDA and  has been authorized for detection and/or diagnosis of SARS-CoV-2 by  FDA under an Emergency Use Authorization (EUA). This EUA will remain  in effect (meaning this test can be used) for the duration of the  Covid-19 declaration under Section 564(b)(1) of the Act, 21  U.S.C. section 360bbb-3(b)(1), unless the authorization is   terminated or revoked. Performed at [facility], [address], [place], [place] [address]      Labs: BNP (last 3 results) No results for input(s): BNP in the last 8760 hours. Basic Metabolic Panel: Recent Labs  Lab 07/03/20 0916 07/03/20 1004 07/04/20 0014 07/05/20 0949 07/06/20 0510  NA 142 141 142 142 143  K 4.3 4.3 4.0 3.3* 3.5  CL 105 106 113* 113* 112*  CO2 27 24 19* 19* 21*  GLUCOSE 107* 101* 101* 137* 102*  BUN 15 18 14 9 13   CREATININE 1.04* 0.83 0.87 0.76 0.86  CALCIUM 10.0 9.8 9.0 8.9 9.1  MG  --   --   --  2.1 2.0   Liver Function Tests: Recent Labs  Lab 06/30/20 1804 07/03/20 1004 07/04/20 0014 07/05/20 0949 07/06/20 0510  AST 28 21 17 18  13*  ALT 20 15 14 13 13   ALKPHOS 63 63 52 58 50  BILITOT 0.6 0.8 0.7 0.5 0.4  PROT 7.3 7.1 6.0* 6.4* 6.0*  ALBUMIN 4.2 4.2 3.6 3.8 3.4*   No results for input(s): LIPASE, AMYLASE in the last 168 hours. Recent Labs  Lab 07/03/20 1004  AMMONIA 26   CBC: Recent Labs  Lab 06/30/20 1804 06/30/20 1809 07/03/20 0916 07/03/20 1542 07/04/20 0014 07/05/20 0949 07/06/20 0510  WBC 6.1   < > 6.7 8.6 7.5 4.9 5.7  NEUTROABS 1.9  --  3.9 4.2 4.6  --   --   HGB 13.3   < > 14.5 13.4 12.0 12.4 11.2*  HCT 42.5   < > 45.4 41.8 36.8 38.5 34.6*  MCV 98.2   < > 97.6 97.4 96.8 96.5 95.3  PLT 262   < > 325 310 262 252 239   < > = values in this interval not displayed.   Cardiac Enzymes: No results for input(s): CKTOTAL, CKMB, CKMBINDEX, TROPONINI in the last 168 hours. BNP: Invalid input(s): POCBNP CBG: Recent Labs  Lab 07/05/20 1241 07/05/20 1419 07/05/20 1612 07/05/20 2117 07/06/20 0629  GLUCAP 67* 133* 90 118* 88   D-Dimer No results for input(s): DDIMER in the last 72 hours. Hgb A1c Recent Labs    07/03/20 1942  HGBA1C 5.1   Lipid Profile No results for input(s): CHOL, HDL, LDLCALC, TRIG, CHOLHDL, LDLDIRECT in the last 72 hours. Thyroid function studies Recent Labs    07/04/20 0857  TSH  0.913   Anemia work up No results for input(s): VITAMINB12, FOLATE, FERRITIN, TIBC, IRON, RETICCTPCT in the last 72 hours. Urinalysis    Component Value Date/Time   COLORURINE YELLOW 08/23/2017 1140   APPEARANCEUR CLEAR 08/23/2017 1140   LABSPEC 1.025 08/23/2017 1140   PHURINE 6.5 08/23/2017 1140   GLUCOSEU NEGATIVE 08/23/2017 1140   HGBUR NEGATIVE 08/23/2017 1140   HGBUR negative 10/26/2009 1516   BILIRUBINUR negative 08/27/2017 1127   KETONESUR NEGATIVE 08/23/2017 1140   PROTEINUR negative 08/27/2017 1127   PROTEINUR NEGATIVE 08/23/2017 1140   UROBILINOGEN 0.2 08/27/2017 1127   UROBILINOGEN 0.2 03/05/2015 1402  NITRITE negative 08/27/2017 1127   NITRITE NEGATIVE 08/23/2017 1140   LEUKOCYTESUR Negative 08/27/2017 1127   Sepsis Labs Invalid input(s): PROCALCITONIN,  WBC,  LACTICIDVEN Microbiology Recent Results (from the past 240 hour(s))  Respiratory Panel by RT PCR (Flu A&B, Covid) - Nasopharyngeal Swab     Status: None   Collection Time: 07/03/20 10:02 AM   Specimen: Nasopharyngeal Swab; Nasopharyngeal(NP) swabs in vial transport medium  Result Value Ref Range Status   SARS Coronavirus 2 by RT PCR NEGATIVE NEGATIVE Final    Comment: (NOTE) SARS-CoV-2 target nucleic acids are NOT DETECTED.  The SARS-CoV-2 RNA is generally detectable in upper respiratoy specimens during the acute phase of infection. The lowest concentration of SARS-CoV-2 viral copies this assay can detect is 131 copies/mL. A negative result does not preclude SARS-Cov-2 infection and should not be used as the sole basis for treatment or other patient management decisions. A negative result may occur with  improper specimen collection/handling, submission of specimen other than nasopharyngeal swab, presence of viral mutation(s) within the areas targeted by this assay, and inadequate number of viral copies (<131 copies/mL). A negative result must be combined with clinical observations, patient history,  and epidemiological information. The expected result is Negative.  Fact Sheet for Patients:  https://www.moore.com/  Fact Sheet for Healthcare Providers:  https://www.young.biz/  This test is no t yet approved or cleared by the [place] FDA and  has been authorized for detection and/or diagnosis of SARS-CoV-2 by FDA under an Emergency Use Authorization (EUA). This EUA will remain  in effect (meaning this test can be used) for the duration of the COVID-19 declaration under Section 564(b)(1) of the Act, 21 U.S.C. section 360bbb-3(b)(1), unless the authorization is terminated or revoked sooner.     Influenza A by PCR NEGATIVE NEGATIVE Final   Influenza B by PCR NEGATIVE NEGATIVE Final    Comment: (NOTE) The Xpert Xpress SARS-CoV-2/FLU/RSV assay is intended as an aid in  the diagnosis of influenza from Nasopharyngeal swab specimens and  should not be used as a sole basis for treatment. Nasal washings and  aspirates are unacceptable for Xpert Xpress SARS-CoV-2/FLU/RSV  testing.  Fact Sheet for Patients: https://www.moore.com/  Fact Sheet for Healthcare Providers: https://www.young.biz/  This test is not yet approved or cleared by the [place] FDA and  has been authorized for detection and/or diagnosis of SARS-CoV-2 by  FDA under an Emergency Use Authorization (EUA). This EUA will remain  in effect (meaning this test can be used) for the duration of the  Covid-19 declaration under Section 564(b)(1) of the Act, 21  U.S.C. section 360bbb-3(b)(1), unless the authorization is  terminated or revoked. Performed at [facility], [address] [place], [place] [address]      Time coordinating discharge:  I have spent 35 minutes face to face with the patient and on the ward discussing the patients care, assessment, plan and disposition with other care givers. >50% of the time was devoted  counseling the patient about the risks and benefits of treatment/Discharge disposition and coordinating care.   SIGNED:   Dimple Nanas, MD  [facility] 07/06/2020, 11:13 AM   If 7PM-7AM, please contact night-coverage

## 2020-07-06 NOTE — Progress Notes (Signed)
SLP Cancellation Note  Patient Details Name: Martha Moss MRN: [id number] DOB: 1966/05/16   Cancelled eval:     Orders received for swallow eval; pt out of room for procedure. Will  Continue efforts.  [name], MA CCC/SLP Acute Rehabilitation Services Office number [phone] Pager [phone]                                                                                                    [name] 07/06/2020, 8:44 AM

## 2020-07-06 NOTE — Evaluation (Signed)
Physical Therapy Evaluation Patient Details Name: Martha Moss MRN: [id number] DOB: 08-18-1965 Today's Date: 07/06/2020   History of Present Illness  54 y.o female presenting with syncope vs. seizure. Workup has been negative for acute abnormalities. PMH includes complicated migraines, fibromyalgia, GERD  Clinical Impression  Pt presents to PT with deficits in functional mobility, gait, balance, strength, power, and endurance. Pt is mildly unsteady without UE support, ambulating with great caution and high guard. Pt step length, gait speed, and overall stability improved with BUE support of RW. Pt is able to mobilize at a supervision level with RW at this time. Pt will benefit from continued acute PT POC to improve gait and balance quality in an effort to restore independence. PT recommends discharge home with outpatient PT, a RW, and intermittent assistance from family.    Follow Up Recommendations Outpatient PT    Equipment Recommendations  Rolling walker with 5" wheels    Recommendations for Other Services       Precautions / Restrictions Precautions Precautions: Fall Restrictions Weight Bearing Restrictions: No      Mobility  Bed Mobility Overal bed mobility: Needs Assistance Bed Mobility: Supine to Sit     Supine to sit: Min assist     General bed mobility comments: spouse providing minA    Transfers Overall transfer level: Needs assistance Equipment used: 1 person hand held assist Transfers: Sit to/from Stand Sit to Stand: Min guard         General transfer comment: hand hold for stability  Ambulation/Gait Ambulation/Gait assistance: Min guard;Supervision Gait Distance (Feet): 150 Feet (150 total, 10' w/o device, 76' with HHA, 20' cane, 50' RW) Assistive device: Rolling walker (2 wheeled);1 person hand held assist;None;Straight cane Gait Pattern/deviations: Step-through pattern Gait velocity: reduced Gait velocity interpretation: <1.8 ft/sec,  indicate of risk for recurrent falls General Gait Details: pt with slowed step through gait, reduced stride length. Pt with increased lateral sway without UE support, improved with BUE support. Also with increased gait speed with UE support  Stairs Stairs: Yes Stairs assistance: Min guard Stair Management: One rail Right;Step to pattern;Forwards;Sideways Number of Stairs: 2 General stair comments: 2 trials of 2 steps  Wheelchair Mobility    Modified Rankin (Stroke Patients Only)       Balance Overall balance assessment: Needs assistance Sitting-balance support: No upper extremity supported;Feet supported Sitting balance-Leahy Scale: Good     Standing balance support: No upper extremity supported Standing balance-Leahy Scale: Fair                               Pertinent Vitals/Pain Pain Assessment: Faces Faces Pain Scale: Hurts a little bit Pain Location: throat Pain Descriptors / Indicators: Sore Pain Intervention(s): Monitored during session    Home Living Family/patient expects to be discharged to:: Private residence Living Arrangements: Spouse/significant other Available Help at Discharge: Family;Available 24 hours/day Type of Home: House Home Access: Stairs to enter Entrance Stairs-Rails: Right Entrance Stairs-Number of Steps: 3 Home Layout: One level Home Equipment: None      Prior Function Level of Independence: Independent         Comments: Fully independent. Works for [employer]. has PhD in [place]. Very active     Hand Dominance        Extremity/Trunk Assessment   Upper Extremity Assessment Upper Extremity Assessment: Overall WFL for tasks assessed    Lower Extremity Assessment Lower Extremity Assessment: Generalized weakness    Cervical /  Trunk Assessment Cervical / Trunk Assessment: Normal  Communication   Communication: Expressive difficulties (slowed response times)  Cognition Arousal/Alertness: Awake/alert Behavior  During Therapy: WFL for tasks assessed/performed Overall Cognitive Status: Impaired/Different from baseline Area of Impairment: Problem solving                             Problem Solving: Slow processing        General Comments General comments (skin integrity, edema, etc.): VSS on RA    Exercises     Assessment/Plan    PT Assessment Patient needs continued PT services  PT Problem List Decreased balance;Decreased strength;Decreased activity tolerance;Decreased mobility       PT Treatment Interventions DME instruction;Gait training;Stair training;Functional mobility training;Balance training;Therapeutic activities;Patient/family education;Neuromuscular re-education    PT Goals (Current goals can be found in the Care Plan section)  Acute Rehab PT Goals Patient Stated Goal: To go home PT Goal Formulation: With patient/family Time For Goal Achievement: 07/20/20 Potential to Achieve Goals: Good    Frequency Min 3X/week   Barriers to discharge        Co-evaluation               AM-PAC PT "6 Clicks" Mobility  Outcome Measure Help needed turning from your back to your side while in a flat bed without using bedrails?: None Help needed moving from lying on your back to sitting on the side of a flat bed without using bedrails?: A Little Help needed moving to and from a bed to a chair (including a wheelchair)?: A Little Help needed standing up from a chair using your arms (e.g., wheelchair or bedside chair)?: A Little Help needed to walk in hospital room?: None Help needed climbing 3-5 steps with a railing? : A Little 6 Click Score: 20    End of Session   Activity Tolerance: Patient tolerated treatment well Patient left: in bed;with call bell/phone within reach;with family/visitor present Nurse Communication: Mobility status PT Visit Diagnosis: Unsteadiness on feet (R26.81)    Time: 5277-8242 PT Time Calculation (min) (ACUTE ONLY): 27 min   Charges:    PT Evaluation $PT Eval Low Complexity: 1 Low PT Treatments $Gait Training: 8-22 mins        Arlyss Gandy, PT, DPT Acute Rehabilitation Pager: [phone]  Arlyss Gandy 07/06/2020, 11:15 AM

## 2020-07-06 NOTE — Anesthesia Preprocedure Evaluation (Signed)
Anesthesia Evaluation  Patient identified by MRN, date of birth, ID band Patient awake    Reviewed: Allergy & Precautions, H&P , NPO status , Patient's Chart, lab work & pertinent test results  History of Anesthesia Complications (+) PONV and history of anesthetic complications  Airway Mallampati: II   Neck ROM: full    Dental   Pulmonary    breath sounds clear to auscultation       Cardiovascular negative cardio ROS   Rhythm:regular Rate:Normal     Neuro/Psych  Headaches, Seizures -,  PSYCHIATRIC DISORDERS Anxiety Depression    GI/Hepatic GERD  ,  Endo/Other    Renal/GU      Musculoskeletal   Abdominal   Peds  Hematology   Anesthesia Other Findings   Reproductive/Obstetrics                             Anesthesia Physical Anesthesia Plan  ASA: II  Anesthesia Plan: MAC   Post-op Pain Management:    Induction: Intravenous  PONV Risk Score and Plan: 3 and Propofol infusion and Treatment may vary due to age or medical condition  Airway Management Planned: Nasal Cannula  Additional Equipment:   Intra-op Plan:   Post-operative Plan:   Informed Consent: I have reviewed the patients History and Physical, chart, labs and discussed the procedure including the risks, benefits and alternatives for the proposed anesthesia with the patient or authorized representative who has indicated his/her understanding and acceptance.       Plan Discussed with: CRNA, Anesthesiologist and Surgeon  Anesthesia Plan Comments:         Anesthesia Quick Evaluation

## 2020-07-08 ENCOUNTER — Encounter (HOSPITAL_COMMUNITY): Payer: Self-pay | Admitting: Gastroenterology

## 2020-07-08 NOTE — Telephone Encounter (Signed)
This was discussed with Patient and husband by Dr. Melynda Ripple, they have already been advised no need to call thanks

## 2020-07-09 ENCOUNTER — Other Ambulatory Visit: Payer: Self-pay | Admitting: Physician Assistant

## 2020-07-09 LAB — SURGICAL PATHOLOGY

## 2020-07-10 ENCOUNTER — Encounter: Payer: Self-pay | Admitting: Gastroenterology

## 2020-07-17 ENCOUNTER — Other Ambulatory Visit (INDEPENDENT_AMBULATORY_CARE_PROVIDER_SITE_OTHER): Payer: Self-pay

## 2020-07-17 ENCOUNTER — Ambulatory Visit (INDEPENDENT_AMBULATORY_CARE_PROVIDER_SITE_OTHER): Payer: Self-pay | Admitting: Physician Assistant

## 2020-07-17 ENCOUNTER — Other Ambulatory Visit: Payer: Self-pay

## 2020-07-17 ENCOUNTER — Encounter: Payer: Self-pay | Admitting: Physician Assistant

## 2020-07-17 VITALS — BP 138/80 | HR 90 | Temp 98.4°F | Resp 16 | Ht 62.0 in | Wt 122.0 lb

## 2020-07-17 DIAGNOSIS — R002 Palpitations: Secondary | ICD-10-CM

## 2020-07-17 DIAGNOSIS — R710 Precipitous drop in hematocrit: Secondary | ICD-10-CM

## 2020-07-17 DIAGNOSIS — R270 Ataxia, unspecified: Secondary | ICD-10-CM

## 2020-07-17 DIAGNOSIS — K219 Gastro-esophageal reflux disease without esophagitis: Secondary | ICD-10-CM

## 2020-07-17 DIAGNOSIS — E8809 Other disorders of plasma-protein metabolism, not elsewhere classified: Secondary | ICD-10-CM

## 2020-07-17 DIAGNOSIS — F8081 Childhood onset fluency disorder: Secondary | ICD-10-CM

## 2020-07-17 DIAGNOSIS — R4702 Dysphasia: Secondary | ICD-10-CM

## 2020-07-17 LAB — COMPREHENSIVE METABOLIC PANEL
ALT: 17 U/L (ref 0–35)
AST: 15 U/L (ref 0–37)
Albumin: 4.8 g/dL (ref 3.5–5.2)
Alkaline Phosphatase: 76 U/L (ref 39–117)
BUN: 22 mg/dL (ref 6–23)
CO2: 26 mEq/L (ref 19–32)
Calcium: 9.5 mg/dL (ref 8.4–10.5)
Chloride: 108 mEq/L (ref 96–112)
Creatinine, Ser: 1.06 mg/dL (ref 0.40–1.20)
GFR: 59.49 mL/min — ABNORMAL LOW (ref >60.00)
Glucose, Bld: 84 mg/dL (ref 70–99)
Potassium: 3.8 mEq/L (ref 3.5–5.1)
Sodium: 142 mEq/L (ref 135–145)
Total Bilirubin: 0.2 mg/dL (ref 0.2–1.2)
Total Protein: 8 g/dL (ref 6.0–8.3)

## 2020-07-17 LAB — CBC WITH DIFFERENTIAL/PLATELET
Basophils Absolute: 0.1 10*3/uL (ref 0.0–0.1)
Basophils Relative: 0.9 % (ref 0.0–3.0)
Eosinophils Absolute: 0.1 10*3/uL (ref 0.0–0.7)
Eosinophils Relative: 0.8 % (ref 0.0–5.0)
HCT: 42.6 % (ref 36.0–46.0)
Hemoglobin: 14.2 g/dL (ref 12.0–15.0)
Lymphocytes Relative: 35.6 % (ref 12.0–46.0)
Lymphs Abs: 2.8 10*3/uL (ref 0.7–4.0)
MCHC: 33.2 g/dL (ref 30.0–36.0)
MCV: 92.9 fl (ref 78.0–100.0)
Monocytes Absolute: 0.5 10*3/uL (ref 0.1–1.0)
Monocytes Relative: 5.9 % (ref 3.0–12.0)
Neutro Abs: 4.5 10*3/uL (ref 1.4–7.7)
Neutrophils Relative %: 56.8 % (ref 43.0–77.0)
Platelets: 301 10*3/uL (ref 150.0–400.0)
RBC: 4.59 Mil/uL (ref 3.87–5.11)
RDW: 13.4 % (ref 11.5–15.5)
WBC: 7.9 10*3/uL (ref 4.0–10.5)

## 2020-07-17 NOTE — Patient Instructions (Addendum)
Please go to the lab today for blood work.  I will call you with your results. We will alter treatment regimen(s) if indicated by your results.   You will be contacted by [facility] for assessment.  You will also be called by Cardiology for further evaluation of palpitations.  If they cannot see you within a couple of weeks, I will want to proceed with a holter study.   Please follow-up with GI as scheduled. Continue the Protonix and Pepcid. Follow the diet below. Start a daily probiotic.   ER for any recurrence of seizure like symptoms, chest pain or significant dizziness. .   Food Choices for Gastroesophageal Reflux Disease, Adult When you have gastroesophageal reflux disease (GERD), the foods you eat and your eating habits are very important. Choosing the right foods can help ease your discomfort. Think about working with a nutrition specialist (dietitian) to help you make good choices. What are tips for following this plan?  Meals  Choose healthy foods that are low in fat, such as fruits, vegetables, whole grains, low-fat dairy products, and lean meat, fish, and poultry.  Eat small meals often instead of 3 large meals a day. Eat your meals slowly, and in a place where you are relaxed. Avoid bending over or lying down until 2-3 hours after eating.  Avoid eating meals 2-3 hours before bed.  Avoid drinking a lot of liquid with meals.  Cook foods using methods other than frying. Bake, grill, or broil food instead.  Avoid or limit: ? Chocolate. ? Peppermint or spearmint. ? Alcohol. ? Pepper. ? Black and decaffeinated coffee. ? Black and decaffeinated tea. ? Bubbly (carbonated) soft drinks. ? Caffeinated energy drinks and soft drinks.  Limit high-fat foods such as: ? Fatty meat or fried foods. ? Whole milk, cream, butter, or ice cream. ? Nuts and nut butters. ? Pastries, donuts, and sweets made with butter or shortening.  Avoid foods that cause symptoms. These  foods may be different for everyone. Common foods that cause symptoms include: ? Tomatoes. ? Oranges, lemons, and limes. ? Peppers. ? Spicy food. ? Onions and garlic. ? Vinegar. Lifestyle  Maintain a healthy weight. Ask your doctor what weight is healthy for you. If you need to lose weight, work with your doctor to do so safely.  Exercise for at least 30 minutes for 5 or more days each week, or as told by your doctor.  Wear loose-fitting clothes.  Do not smoke. If you need help quitting, ask your doctor.  Sleep with the head of your bed higher than your feet. Use a wedge under the mattress or blocks under the bed frame to raise the head of the bed. Summary  When you have gastroesophageal reflux disease (GERD), food and lifestyle choices are very important in easing your symptoms.  Eat small meals often instead of 3 large meals a day. Eat your meals slowly, and in a place where you are relaxed.  Limit high-fat foods such as fatty meat or fried foods.  Avoid bending over or lying down until 2-3 hours after eating.  Avoid peppermint and spearmint, caffeine, alcohol, and chocolate. This information is not intended to replace advice given to you by your health care provider. Make sure you discuss any questions you have with your health care provider. Document Revised: 11/18/2018 Document Reviewed: 09/02/2016 Elsevier Patient Education  2020 [employer].

## 2020-07-17 NOTE — Progress Notes (Signed)
Patient presents to clinic today with her husband for hospital follow-up. Patient was admitted to [facility] on 07/03/2020 after ER evaluation for syncope versus seizure.  Patient evaluated 3 days prior for sudden onset of aphasia and was found to have negative MRI and resolution of symptoms so was discharged home.  Morning of admission patient started to feel nauseated and had severe headache with an episode of syncope.  Was being seen by her neurologist when she had what was felt to be a pseudoseizure in the office and was sent to ER for further evaluation and EEG.  While in the ER neurology was consulted and EEG planned.  Patient subsequently admitted to the hospital for this procedure.  EKG obtained revealing right axis deviation but no history of lung problems.  Was noted to be orthostatic.  IV fluids given.  Plan to obtain MRI with contrast during hospitalization.  During hospitalization, MRI repeated and negative.  Echocardiogram revealed ejection fraction of 55%.  TSH and cortisol levels normal.  Alcohol and UDS negative.  Neurology felt that she was having pseudoseizures induced by stress.  Recommend outpatient follow-up with psychology.  During admission patient had epigastric abdominal pain with nausea.  Underwent EGD on 1126 after being cleared by neurology which revealed slight dilation of proximal esophagus.  Otherwise unremarkable.  Biopsies were taken with results pending at time of discharge.  CT abdomen pelvis negative.  No further episodes noted during hospitalization.  Given negative work-up and lack of recurring symptoms, patient was discharged on 07/06/2020 to follow-up closely with primary care, neurology, gastroenterology and psychology.  Since discharge patient and husband note the patient is still having issues with her speech, mainly sometimes with expressive aphasia.  Is still having some balance issues and headache.  They do have a follow-up with current neurologist but  are adamant on being set up with a new provider as they want a second opinion.  Patient denies any recurrent epigastric pain.  Is taking chronic medications as directed.  Has appointment with gastroenterology next week.  Of note patient endorses having episodes of palpitations, typically short-lived but are making her anxious.  Denies any chest pain, lightheadedness or dizziness during these episodes.  Is asymptomatic at present.  Past Medical History:  Diagnosis Date  . Depression   . Fibromyalgia   . GERD (gastroesophageal reflux disease)   . Migraine   . PONV (postoperative nausea and vomiting)     Current Outpatient Medications on File Prior to Visit  Medication Sig Dispense Refill  . buPROPion (WELLBUTRIN XL) 300 MG 24 hr tablet TAKE 1 TABLET BY MOUTH DAILY 90 tablet 1  . fexofenadine (ALLEGRA) 180 MG tablet Take 180 mg by mouth daily.    Marland Kitchen levocetirizine (XYZAL) 5 MG tablet Take 5 mg by mouth every evening.    . magnesium 30 MG tablet Take 30 mg by mouth every other day.    . Multiple Vitamin (MULTIVITAMIN WITH MINERALS) TABS tablet Take 1 tablet by mouth daily.    . pantoprazole (PROTONIX) 40 MG tablet Take 1 tablet (40 mg total) by mouth daily before breakfast. 30 tablet 2  . sucralfate (CARAFATE) 1 g tablet Take 1 tablet (1 g total) by mouth 4 (four) times daily -  with meals and at bedtime. 90 tablet 0  . topiramate (TOPAMAX) 200 MG tablet TAKE 1 TABLET BY MOUTH TWICE DAILY (Patient taking differently: Take 100 mg by mouth daily. ) 180 tablet 0  . vitamin E 1000 UNIT  capsule Take 1,000 Units by mouth daily.     No current facility-administered medications on file prior to visit.    Allergies  Allergen Reactions  . Bee Venom   . Azithromycin     REACTION: hives  . Diphenhydramine Hcl     REACTION: hives  . Erythromycin     REACTION: hives  . Latex     REACTION: rash  . Morphine And Related Nausea And Vomiting  . Penicillins     REACTION: rash, fever  . Percocet  [Oxycodone-Acetaminophen] Other (See Comments)    Hallucinations   . Sulfonamide Derivatives     REACTION: rash, fever  . Tetracyclines & Related     Family History  Problem Relation Age of Onset  . Kidney Stones Daughter   . Migraines Mother   . Heart attack Father 6  . Diabetes Maternal Grandmother   . Stroke Sister   . Migraines Brother   . Stroke Brother   . Migraines Other        many family members on maternal side     Social History   Socioeconomic History  . Marital status: Married    Spouse name: Not on file  . Number of children: Not on file  . Years of education: Not on file  . Highest education level: Not on file  Occupational History  . Not on file  Tobacco Use  . Smoking status: Never Smoker  . Smokeless tobacco: Never Used  Vaping Use  . Vaping Use: Never used  Substance and Sexual Activity  . Alcohol use: Yes    Comment: rarely  . Drug use: No  . Sexual activity: Yes    Birth control/protection: Surgical  Other Topics Concern  . Not on file  Social History Narrative   Lives at home with husband    Right handed   Caffeine: none    Social Determinants of Health   Financial Resource Strain:   . Difficulty of Paying Living Expenses: Not on file  Food Insecurity:   . Worried About Charity fundraiser in the Last Year: Not on file  . Ran Out of Food in the Last Year: Not on file  Transportation Needs:   . Lack of Transportation (Medical): Not on file  . Lack of Transportation (Non-Medical): Not on file  Physical Activity:   . Days of Exercise per Week: Not on file  . Minutes of Exercise per Session: Not on file  Stress:   . Feeling of Stress : Not on file  Social Connections:   . Frequency of Communication with Friends and Family: Not on file  . Frequency of Social Gatherings with Friends and Family: Not on file  . Attends Religious Services: Not on file  . Active Member of Clubs or Organizations: Not on file  . Attends Theatre manager Meetings: Not on file  . Marital Status: Not on file    Review of Systems - See HPI.  All other ROS are negative.  BP 138/80   Pulse 90   Temp 98.4 F (36.9 C) (Temporal)   Resp 16   Ht 5' 2"  (1.575 m)   Wt 122 lb (55.3 kg)   SpO2 99%   BMI 22.31 kg/m   Physical Exam  Recent Results (from the past 2160 hour(s))  CBC with Differential/Platelet     Status: None   Collection Time: 06/30/20  6:04 PM  Result Value Ref Range   WBC 6.1 4.0 - 10.5  K/uL   RBC 4.33 3.87 - 5.11 MIL/uL   Hemoglobin 13.3 12.0 - 15.0 g/dL   HCT 42.5 36 - 46 %   MCV 98.2 80.0 - 100.0 fL   MCH 30.7 26.0 - 34.0 pg   MCHC 31.3 30.0 - 36.0 g/dL   RDW 13.0 11.5 - 15.5 %   Platelets 262 150 - 400 K/uL   nRBC 0.0 0.0 - 0.2 %   Neutrophils Relative % 32 %   Neutro Abs 1.9 1.7 - 7.7 K/uL   Lymphocytes Relative 56 %   Lymphs Abs 3.4 0.7 - 4.0 K/uL   Monocytes Relative 10 %   Monocytes Absolute 0.6 0.1 - 1.0 K/uL   Eosinophils Relative 1 %   Eosinophils Absolute 0.1 0.0 - 0.5 K/uL   Basophils Relative 1 %   Basophils Absolute 0.1 0.0 - 0.1 K/uL   Immature Granulocytes 0 %   Abs Immature Granulocytes 0.01 0.00 - 0.07 K/uL    Comment: Performed at [facility] [address], [place], [place] [address]  Comprehensive metabolic panel     Status: None   Collection Time: 06/30/20  6:04 PM  Result Value Ref Range   Sodium 143 135 - 145 mmol/L   Potassium 4.1 3.5 - 5.1 mmol/L   Chloride 109 98 - 111 mmol/L   CO2 24 22 - 32 mmol/L   Glucose, Bld 98 70 - 99 mg/dL    Comment: Glucose reference range applies only to samples taken after fasting for at least 8 hours.   BUN 14 6 - 20 mg/dL   Creatinine, Ser 0.85 0.44 - 1.00 mg/dL   Calcium 9.7 8.9 - 10.3 mg/dL   Total Protein 7.3 6.5 - 8.1 g/dL   Albumin 4.2 3.5 - 5.0 g/dL   AST 28 15 - 41 U/L   ALT 20 0 - 44 U/L   Alkaline Phosphatase 63 38 - 126 U/L   Total Bilirubin 0.6 0.3 - 1.2 mg/dL   GFR, Estimated >60 >60 mL/min    Comment:  (NOTE) Calculated using the CKD-EPI Creatinine Equation (2021)    Anion gap 10 5 - 15    Comment: Performed at [facility] [address] [place], [place] [address]  I-stat chem 8, ED (not at [facility] or [facility])     Status: Abnormal   Collection Time: 06/30/20  6:09 PM  Result Value Ref Range   Sodium 144 135 - 145 mmol/L   Potassium 4.1 3.5 - 5.1 mmol/L   Chloride 110 98 - 111 mmol/L   BUN 20 6 - 20 mg/dL   Creatinine, Ser 0.70 0.44 - 1.00 mg/dL   Glucose, Bld 96 70 - 99 mg/dL    Comment: Glucose reference range applies only to samples taken after fasting for at least 8 hours.   Calcium, Ion 1.10 (L) 1.15 - 1.40 mmol/L   TCO2 25 22 - 32 mmol/L   Hemoglobin 13.9 12.0 - 15.0 g/dL   HCT 41.0 36 - 46 %  CBG monitoring, ED     Status: None   Collection Time: 07/03/20  9:07 AM  Result Value Ref Range   Glucose-Capillary 96 70 - 99 mg/dL    Comment: Glucose reference range applies only to samples taken after fasting for at least 8 hours.   Comment 1 Notify RN    Comment 2 Document in Chart   Basic metabolic panel     Status: Abnormal   Collection Time: 07/03/20  9:16 AM  Result Value Ref Range   Sodium 142 135 - 145 mmol/L   Potassium 4.3 3.5 - 5.1 mmol/L   Chloride 105 98 - 111 mmol/L   CO2 27 22 - 32 mmol/L   Glucose, Bld 107 (H) 70 - 99 mg/dL    Comment: Glucose reference range applies only to samples taken after fasting for at least 8 hours.   BUN 15 6 - 20 mg/dL   Creatinine, Ser 1.04 (H) 0.44 - 1.00 mg/dL   Calcium 10.0 8.9 - 10.3 mg/dL   GFR, Estimated >60 >60 mL/min    Comment: (NOTE) Calculated using the CKD-EPI Creatinine Equation (2021)    Anion gap 10 5 - 15    Comment: Performed at [facility] [address], [place], [place] [address]  CBC with Differential     Status: None   Collection Time: 07/03/20  9:16 AM  Result Value Ref Range   WBC 6.7 4.0 - 10.5 K/uL   RBC 4.65 3.87 - 5.11 MIL/uL   Hemoglobin 14.5 12.0 - 15.0 g/dL   HCT 45.4 36 - 46 %    MCV 97.6 80.0 - 100.0 fL   MCH 31.2 26.0 - 34.0 pg   MCHC 31.9 30.0 - 36.0 g/dL   RDW 12.8 11.5 - 15.5 %   Platelets 325 150 - 400 K/uL   nRBC 0.0 0.0 - 0.2 %   Neutrophils Relative % 58 %   Neutro Abs 3.9 1.7 - 7.7 K/uL   Lymphocytes Relative 33 %   Lymphs Abs 2.2 0.7 - 4.0 K/uL   Monocytes Relative 7 %   Monocytes Absolute 0.4 0.1 - 1.0 K/uL   Eosinophils Relative 1 %   Eosinophils Absolute 0.1 0.0 - 0.5 K/uL   Basophils Relative 1 %   Basophils Absolute 0.0 0.0 - 0.1 K/uL   Immature Granulocytes 0 %   Abs Immature Granulocytes 0.01 0.00 - 0.07 K/uL    Comment: Performed at [facility], [address], [place], [place] [address]  ABO/Rh     Status: None   Collection Time: 07/03/20  9:16 AM  Result Value Ref Range   ABO/RH(D)      A POS Performed at [facility] [address] [place], [place] [address]   Ethanol     Status: None   Collection Time: 07/03/20  9:23 AM  Result Value Ref Range   Alcohol, Ethyl (B) <10 <10 mg/dL    Comment: (NOTE) Lowest detectable limit for serum alcohol is 10 mg/dL.  For medical purposes only. Performed at [facility], [place] [address] [place], [place] [address]   Respiratory Panel by RT PCR (Flu A&B, Covid) - Nasopharyngeal Swab     Status: None   Collection Time: 07/03/20 10:02 AM   Specimen: Nasopharyngeal Swab; Nasopharyngeal(NP) swabs in vial transport medium  Result Value Ref Range   SARS Coronavirus 2 by RT PCR NEGATIVE NEGATIVE    Comment: (NOTE) SARS-CoV-2 target nucleic acids are NOT DETECTED.  The SARS-CoV-2 RNA is generally detectable in upper respiratoy specimens during the acute phase of infection. The lowest concentration of SARS-CoV-2 viral copies this assay can detect is 131 copies/mL. A negative result does not preclude SARS-Cov-2 infection and should not be used as the sole basis for treatment or other patient management decisions. A negative result may occur with  improper specimen  collection/handling, submission of specimen other than nasopharyngeal swab, presence of viral mutation(s) within  the areas targeted by this assay, and inadequate number of viral copies (<131 copies/mL). A negative result must be combined with clinical observations, patient history, and epidemiological information. The expected result is Negative.  Fact Sheet for Patients:  PinkCheek.be  Fact Sheet for Healthcare Providers:  GravelBags.it  This test is no t yet approved or cleared by the [place] FDA and  has been authorized for detection and/or diagnosis of SARS-CoV-2 by FDA under an Emergency Use Authorization (EUA). This EUA will remain  in effect (meaning this test can be used) for the duration of the COVID-19 declaration under Section 564(b)(1) of the Act, 21 U.S.C. section 360bbb-3(b)(1), unless the authorization is terminated or revoked sooner.     Influenza A by PCR NEGATIVE NEGATIVE   Influenza B by PCR NEGATIVE NEGATIVE    Comment: (NOTE) The Xpert Xpress SARS-CoV-2/FLU/RSV assay is intended as an aid in  the diagnosis of influenza from Nasopharyngeal swab specimens and  should not be used as a sole basis for treatment. Nasal washings and  aspirates are unacceptable for Xpert Xpress SARS-CoV-2/FLU/RSV  testing.  Fact Sheet for Patients: PinkCheek.be  Fact Sheet for Healthcare Providers: GravelBags.it  This test is not yet approved or cleared by the [place] FDA and  has been authorized for detection and/or diagnosis of SARS-CoV-2 by  FDA under an Emergency Use Authorization (EUA). This EUA will remain  in effect (meaning this test can be used) for the duration of the  Covid-19 declaration under Section 564(b)(1) of the Act, 21  U.S.C. section 360bbb-3(b)(1), unless the authorization is  terminated or revoked. Performed at [facility], [place] [address], [place], [place] [address]   Urine rapid drug screen (hosp performed)     Status: None   Collection Time: 07/03/20 10:03 AM  Result Value Ref Range   Opiates NONE DETECTED NONE DETECTED   Cocaine NONE DETECTED NONE DETECTED   Benzodiazepines NONE DETECTED NONE DETECTED   Amphetamines NONE DETECTED NONE DETECTED   Tetrahydrocannabinol NONE DETECTED NONE DETECTED   Barbiturates NONE DETECTED NONE DETECTED    Comment: (NOTE) DRUG SCREEN FOR MEDICAL PURPOSES ONLY.  IF CONFIRMATION IS NEEDED FOR ANY PURPOSE, NOTIFY LAB WITHIN 5 DAYS.  LOWEST DETECTABLE LIMITS FOR URINE DRUG SCREEN Drug Class                     Cutoff (ng/mL) Amphetamine and metabolites    1000 Barbiturate and metabolites    200 Benzodiazepine                 063 Tricyclics and metabolites     300 Opiates and metabolites        300 Cocaine and metabolites        300 THC                            50 Performed at [facility], [place] [address], [place], [place] [address]   Ammonia     Status: None   Collection Time: 07/03/20 10:04 AM  Result Value Ref Range   Ammonia 26 9 - 35 umol/L    Comment: Performed at [facility], [place] [address] [place], [place] [address]  Comprehensive metabolic panel     Status: Abnormal   Collection Time: 07/03/20 10:04 AM  Result Value Ref Range   Sodium 141 135 - 145 mmol/L   Potassium 4.3 3.5 - 5.1  mmol/L   Chloride 106 98 - 111 mmol/L   CO2 24 22 - 32 mmol/L   Glucose, Bld 101 (H) 70 - 99 mg/dL    Comment: Glucose reference range applies only to samples taken after fasting for at least 8 hours.   BUN 18 6 - 20 mg/dL   Creatinine, Ser 0.83 0.44 - 1.00 mg/dL   Calcium 9.8 8.9 - 10.3 mg/dL   Total Protein 7.1 6.5 - 8.1 g/dL   Albumin 4.2 3.5 - 5.0 g/dL   AST 21 15 - 41 U/L   ALT 15 0 - 44 U/L   Alkaline Phosphatase 63 38 - 126 U/L   Total Bilirubin 0.8 0.3 - 1.2 mg/dL   GFR, Estimated >60 >60 mL/min    Comment: (NOTE) Calculated using the  CKD-EPI Creatinine Equation (2021)    Anion gap 11 5 - 15    Comment: Performed at [facility] [address], [place], [place] [address]  CBC with Differential/Platelet     Status: None   Collection Time: 07/03/20  3:42 PM  Result Value Ref Range   WBC 8.6 4.0 - 10.5 K/uL   RBC 4.29 3.87 - 5.11 MIL/uL   Hemoglobin 13.4 12.0 - 15.0 g/dL   HCT 41.8 36 - 46 %   MCV 97.4 80.0 - 100.0 fL   MCH 31.2 26.0 - 34.0 pg   MCHC 32.1 30.0 - 36.0 g/dL   RDW 12.7 11.5 - 15.5 %   Platelets 310 150 - 400 K/uL   nRBC 0.0 0.0 - 0.2 %   Neutrophils Relative % 49 %   Neutro Abs 4.2 1.7 - 7.7 K/uL   Lymphocytes Relative 43 %   Lymphs Abs 3.7 0.7 - 4.0 K/uL   Monocytes Relative 7 %   Monocytes Absolute 0.6 0.1 - 1.0 K/uL   Eosinophils Relative 0 %   Eosinophils Absolute 0.0 0.0 - 0.5 K/uL   Basophils Relative 1 %   Basophils Absolute 0.1 0.0 - 0.1 K/uL   Immature Granulocytes 0 %   Abs Immature Granulocytes 0.03 0.00 - 0.07 K/uL    Comment: Performed at [facility], [address], [place], [place] [address]  Sedimentation rate     Status: None   Collection Time: 07/03/20  3:48 PM  Result Value Ref Range   Sed Rate 9 0 - 22 mm/hr    Comment: Performed at [facility], [place] [address] [place], [place] [address]  Type and screen [place]     Status: None   Collection Time: 07/03/20  3:48 PM  Result Value Ref Range   ABO/RH(D) A POS    Antibody Screen NEG    Sample Expiration      07/06/2020,2359 Performed at [facility], [place] [address], [place], [place] [address]   HIV Antibody (routine testing w rflx)     Status: None   Collection Time: 07/03/20  4:23 PM  Result Value Ref Range   HIV Screen 4th Generation wRfx Non Reactive Non Reactive    Comment: Performed at [facility], [place] [address], [place], [place] [address]  Lactic acid, plasma     Status: Abnormal   Collection Time: 07/03/20  7:42 PM  Result Value Ref Range   Lactic Acid,  Venous 3.2 (HH) 0.5 - 1.9 mmol/L    Comment: CRITICAL RESULT CALLED TO, READ BACK BY AND VERIFIED WITH: C.HOLT,RN @2045  07/03/2020 VANG.J Performed at  [facility], [place] [address] [place], [place] [address]   Hemoglobin A1c     Status: None   Collection Time: 07/03/20  7:42 PM  Result Value Ref Range   Hgb A1c MFr Bld 5.1 4.8 - 5.6 %    Comment: (NOTE) Pre diabetes:          5.7%-6.4%  Diabetes:              >6.4%  Glycemic control for   <7.0% adults with diabetes    Mean Plasma Glucose 99.67 mg/dL    Comment: Performed at [facility] [address], [place], [place] [address]  Glucose, capillary     Status: None   Collection Time: 07/04/20 12:13 AM  Result Value Ref Range   Glucose-Capillary 92 70 - 99 mg/dL    Comment: Glucose reference range applies only to samples taken after fasting for at least 8 hours.  CBC with Differential/Platelet     Status: Abnormal   Collection Time: 07/04/20 12:14 AM  Result Value Ref Range   WBC 7.5 4.0 - 10.5 K/uL   RBC 3.80 (L) 3.87 - 5.11 MIL/uL   Hemoglobin 12.0 12.0 - 15.0 g/dL   HCT 36.8 36 - 46 %   MCV 96.8 80.0 - 100.0 fL   MCH 31.6 26.0 - 34.0 pg   MCHC 32.6 30.0 - 36.0 g/dL   RDW 12.7 11.5 - 15.5 %   Platelets 262 150 - 400 K/uL   nRBC 0.0 0.0 - 0.2 %   Neutrophils Relative % 61 %   Neutro Abs 4.6 1.7 - 7.7 K/uL   Lymphocytes Relative 30 %   Lymphs Abs 2.2 0.7 - 4.0 K/uL   Monocytes Relative 8 %   Monocytes Absolute 0.6 0.1 - 1.0 K/uL   Eosinophils Relative 0 %   Eosinophils Absolute 0.0 0.0 - 0.5 K/uL   Basophils Relative 1 %   Basophils Absolute 0.0 0.0 - 0.1 K/uL   Immature Granulocytes 0 %   Abs Immature Granulocytes 0.01 0.00 - 0.07 K/uL    Comment: Performed at [facility] [address] [place], [place] [address]  Comprehensive metabolic panel     Status: Abnormal   Collection Time: 07/04/20 12:14 AM  Result Value Ref Range   Sodium 142 135 - 145 mmol/L   Potassium 4.0 3.5 - 5.1 mmol/L    Chloride 113 (H) 98 - 111 mmol/L   CO2 19 (L) 22 - 32 mmol/L   Glucose, Bld 101 (H) 70 - 99 mg/dL    Comment: Glucose reference range applies only to samples taken after fasting for at least 8 hours.   BUN 14 6 - 20 mg/dL   Creatinine, Ser 0.87 0.44 - 1.00 mg/dL   Calcium 9.0 8.9 - 10.3 mg/dL   Total Protein 6.0 (L) 6.5 - 8.1 g/dL   Albumin 3.6 3.5 - 5.0 g/dL   AST 17 15 - 41 U/L   ALT 14 0 - 44 U/L   Alkaline Phosphatase 52 38 - 126 U/L   Total Bilirubin 0.7 0.3 - 1.2 mg/dL   GFR, Estimated >60 >60 mL/min    Comment: (NOTE) Calculated using the CKD-EPI Creatinine Equation (2021)    Anion gap 10 5 - 15    Comment: Performed at [facility] [address], [place], [place] [address]  Lactic acid, plasma     Status: Abnormal   Collection Time: 07/04/20 12:14 AM  Result  Value Ref Range   Lactic Acid, Venous 2.2 (HH) 0.5 - 1.9 mmol/L    Comment: CRITICAL VALUE NOTED.  VALUE IS CONSISTENT WITH PREVIOUSLY REPORTED AND CALLED VALUE. Performed at [facility], [place] [address] [place], [place] [address]   TSH     Status: None   Collection Time: 07/04/20  8:57 AM  Result Value Ref Range   TSH 0.913 0.350 - 4.500 uIU/mL    Comment: Performed by a 3rd Generation assay with a functional sensitivity of <=0.01 uIU/mL. Performed at [facility], [place] [address], [place], [place] [address]   Cortisol     Status: None   Collection Time: 07/04/20  8:57 AM  Result Value Ref Range   Cortisol, Plasma 24.0 ug/dL    Comment: (NOTE) AM    6.7 - 22.6 ug/dL PM   <10.0       ug/dL Performed at [facility] [address] [place], [place] [address]   Glucose, capillary     Status: Abnormal   Collection Time: 07/04/20 11:57 AM  Result Value Ref Range   Glucose-Capillary 136 (H) 70 - 99 mg/dL    Comment: Glucose reference range applies only to samples taken after fasting for at least 8 hours.  ECHOCARDIOGRAM COMPLETE     Status: None   Collection Time: 07/04/20  2:17 PM   Result Value Ref Range   Weight 1,940.05 oz   Height 62 in   BP 113/68 mmHg   S' Lateral 2.60 cm   Area-P 1/2 3.48 cm2  Glucose, capillary     Status: Abnormal   Collection Time: 07/04/20  4:27 PM  Result Value Ref Range   Glucose-Capillary 105 (H) 70 - 99 mg/dL    Comment: Glucose reference range applies only to samples taken after fasting for at least 8 hours.  Glucose, capillary     Status: Abnormal   Collection Time: 07/04/20 10:34 PM  Result Value Ref Range   Glucose-Capillary 108 (H) 70 - 99 mg/dL    Comment: Glucose reference range applies only to samples taken after fasting for at least 8 hours.  CBC     Status: None   Collection Time: 07/05/20  9:49 AM  Result Value Ref Range   WBC 4.9 4.0 - 10.5 K/uL   RBC 3.99 3.87 - 5.11 MIL/uL   Hemoglobin 12.4 12.0 - 15.0 g/dL   HCT 38.5 36 - 46 %   MCV 96.5 80.0 - 100.0 fL   MCH 31.1 26.0 - 34.0 pg   MCHC 32.2 30.0 - 36.0 g/dL   RDW 12.7 11.5 - 15.5 %   Platelets 252 150 - 400 K/uL   nRBC 0.0 0.0 - 0.2 %    Comment: Performed at [facility], [place] [address], [place], [place] [address]  Comprehensive metabolic panel     Status: Abnormal   Collection Time: 07/05/20  9:49 AM  Result Value Ref Range   Sodium 142 135 - 145 mmol/L   Potassium 3.3 (L) 3.5 - 5.1 mmol/L    Comment: NO VISIBLE HEMOLYSIS   Chloride 113 (H) 98 - 111 mmol/L   CO2 19 (L) 22 - 32 mmol/L   Glucose, Bld 137 (H) 70 - 99 mg/dL    Comment: Glucose reference range applies only to samples taken after fasting for at least 8 hours.   BUN 9 6 - 20 mg/dL   Creatinine, Ser 0.76 0.44 - 1.00 mg/dL   Calcium  8.9 8.9 - 10.3 mg/dL   Total Protein 6.4 (L) 6.5 - 8.1 g/dL   Albumin 3.8 3.5 - 5.0 g/dL   AST 18 15 - 41 U/L   ALT 13 0 - 44 U/L   Alkaline Phosphatase 58 38 - 126 U/L   Total Bilirubin 0.5 0.3 - 1.2 mg/dL   GFR, Estimated >60 >60 mL/min    Comment: (NOTE) Calculated using the CKD-EPI Creatinine Equation (2021)    Anion gap 10 5 - 15    Comment:  Performed at [facility] [address] [place], [place] [address]  Magnesium     Status: None   Collection Time: 07/05/20  9:49 AM  Result Value Ref Range   Magnesium 2.1 1.7 - 2.4 mg/dL    Comment: Performed at [place] [place] [address], [place], [place] [address]  Cortisol-am, blood     Status: None   Collection Time: 07/05/20  9:49 AM  Result Value Ref Range   Cortisol - AM 11.4 6.7 - 22.6 ug/dL    Comment: Performed at [facility], [place] [address], [place], [place] [address]  Glucose, capillary     Status: Abnormal   Collection Time: 07/05/20 12:41 PM  Result Value Ref Range   Glucose-Capillary 67 (L) 70 - 99 mg/dL    Comment: Glucose reference range applies only to samples taken after fasting for at least 8 hours.   Comment 1 Notify RN    Comment 2 Document in Chart   Glucose, capillary     Status: Abnormal   Collection Time: 07/05/20  2:19 PM  Result Value Ref Range   Glucose-Capillary 133 (H) 70 - 99 mg/dL    Comment: Glucose reference range applies only to samples taken after fasting for at least 8 hours.  Glucose, capillary     Status: None   Collection Time: 07/05/20  4:12 PM  Result Value Ref Range   Glucose-Capillary 90 70 - 99 mg/dL    Comment: Glucose reference range applies only to samples taken after fasting for at least 8 hours.   Comment 1 Notify RN    Comment 2 Document in Chart   Glucose, capillary     Status: Abnormal   Collection Time: 07/05/20  9:17 PM  Result Value Ref Range   Glucose-Capillary 118 (H) 70 - 99 mg/dL    Comment: Glucose reference range applies only to samples taken after fasting for at least 8 hours.  CBC     Status: Abnormal   Collection Time: 07/06/20  5:10 AM  Result Value Ref Range   WBC 5.7 4.0 - 10.5 K/uL   RBC 3.63 (L) 3.87 - 5.11 MIL/uL   Hemoglobin 11.2 (L) 12.0 - 15.0 g/dL   HCT 34.6 (L) 36 - 46 %   MCV 95.3 80.0 - 100.0 fL   MCH 30.9 26.0 - 34.0 pg   MCHC 32.4 30.0 - 36.0 g/dL   RDW 12.7 11.5 -  15.5 %   Platelets 239 150 - 400 K/uL   nRBC 0.0 0.0 - 0.2 %    Comment: Performed at [facility], [place] [address], [place], [place] [address]  Comprehensive metabolic panel     Status: Abnormal   Collection Time: 07/06/20  5:10 AM  Result Value Ref Range   Sodium 143 135 - 145 mmol/L   Potassium 3.5 3.5 - 5.1 mmol/L   Chloride 112 (H) 98 - 111 mmol/L   CO2 21 (  L) 22 - 32 mmol/L   Glucose, Bld 102 (H) 70 - 99 mg/dL    Comment: Glucose reference range applies only to samples taken after fasting for at least 8 hours.   BUN 13 6 - 20 mg/dL   Creatinine, Ser 0.86 0.44 - 1.00 mg/dL   Calcium 9.1 8.9 - 10.3 mg/dL   Total Protein 6.0 (L) 6.5 - 8.1 g/dL   Albumin 3.4 (L) 3.5 - 5.0 g/dL   AST 13 (L) 15 - 41 U/L   ALT 13 0 - 44 U/L   Alkaline Phosphatase 50 38 - 126 U/L   Total Bilirubin 0.4 0.3 - 1.2 mg/dL   GFR, Estimated >60 >60 mL/min    Comment: (NOTE) Calculated using the CKD-EPI Creatinine Equation (2021)    Anion gap 10 5 - 15    Comment: Performed at [facility], [place] [address], [place], [place] [address]  Magnesium     Status: None   Collection Time: 07/06/20  5:10 AM  Result Value Ref Range   Magnesium 2.0 1.7 - 2.4 mg/dL    Comment: Performed at [place] [address] [place], [place] [address]  Glucose, capillary     Status: None   Collection Time: 07/06/20  6:29 AM  Result Value Ref Range   Glucose-Capillary 88 70 - 99 mg/dL    Comment: Glucose reference range applies only to samples taken after fasting for at least 8 hours.   Comment 1 Notify RN    Comment 2 Document in Chart   Surgical pathology     Status: None   Collection Time: 07/06/20  8:20 AM  Result Value Ref Range   SURGICAL PATHOLOGY      SURGICAL PATHOLOGY CASE: [id number] PATIENT: Merrilee Jansky Surgical Pathology Report     Clinical History: dysphagia, RUQ pain, nausea, R/O celiac, R/O H. Pylori, R/O EOE (cm)     FINAL MICROSCOPIC DIAGNOSIS:  A. DUODENUM,  BIOPSY: - Benign small bowel mucosa. - No villous blunting or increase in intraepithelial lymphocytes. - No dysplasia or malignancy.  B. STOMACH, BIOPSY: - Reactive gastropathy with erosions. - Warthin-Starry is negative for Helicobacter pylori. - No intestinal metaplasia, dysplasia, or malignancy.  C. ESOPHAGUS, DISTAL, BIOPSY: - Mild reflux changes. - No intestinal metaplasia, dysplasia, or malignancy.  D. ESOPHAGUS, PROXIMAL, BIOPSY: - Benign squamous mucosa. - No increase in intraepithelial eosinophils. - No intestinal metaplasia, dysplasia, or malignancy.   GROSS DESCRIPTION:  A: Received in formalin are tan, soft tissue fragments that are submitted in toto. Number: 4 size: 0.2-0.3 cm blocks: 1  B: Received in for malin are tan, soft tissue fragments that are submitted in toto. Number: 4 size: 0.2-0.5 cm blocks: 1  C: Received in formalin are tan, soft tissue fragments that are submitted in toto. Number: 4 size: 0.2-0.3 cm blocks: 1  D: Received in formalin are tan, soft tissue fragments that are submitted in toto. Number: 4 size: 0.2-0.4 cm blocks: 1 (GRP 07/06/2020)   Final Diagnosis performed by Vicente Males, MD.   Electronically signed 07/09/2020 Technical component performed at [facility]. [facility], [place] [address] [place], [place] [address].  Professional component performed at [facility], [place] [address] [place], [place] [address].  Immunohistochemistry Technical component (if applicable) was performed at [facility]. [address], [place], [place],  [address].   IMMUNOHISTOCHEMISTRY DISCLAIMER (if applicable): Some of these immunohistochemical stains may have been developed and the performance characteristics  de termine by [facility]. Some may not have been cleared or approved by the U.S. Food and Drug Administration. The FDA has determined that such clearance or approval is not  necessary. This test is used for clinical purposes. It should not be regarded as investigational or for research. This laboratory is certified under the [place] (CLIA-88) as qualified to perform high complexity clinical laboratory testing.  The controls stained appropriately.   Glucose, capillary     Status: None   Collection Time: 07/06/20 12:38 PM  Result Value Ref Range   Glucose-Capillary 83 70 - 99 mg/dL    Comment: Glucose reference range applies only to samples taken after fasting for at least 8 hours.    Assessment/Plan: 1. Ataxia 2. Stuttering 3. Expressive dysphasia  Patient able to ambulate successfully in office today with use of cane.  Is mostly stable but sometimes relies on her husband for support.  Slight stuttering and occasional expressive dysphagia noted.  No slurring of speech noted.  Plan to refer urgently to [facility] for second opinion per patient request.  Encouraged her to follow-up with her current neurologist if the new specialist cannot see her within a week.  Repeat labs today.  Strict ER precautions discussed with patient and spouse. - Ambulatory referral to Neurology - CBC w/Diff; Future - Comp Met (CMET); Future  4. Gastroesophageal reflux disease without esophagitis Improved since being home.  Continue Protonix and Pepcid.  Recommend she start a daily probiotic.  GERD diet reviewed.  Handout given.  5. Palpitations Asymptomatic at time of visit.  Repeat EKG today reveals sinus rhythm at rate of 89 bpm.  Will obtain labs today.  Referral to cardiology placed.  Discussed with patient if they cannot see her in a very timely fashion I would want to go ahead and proceed with Holter monitor.  Strict ER precautions discussed with patient and husband. - Ambulatory referral to Cardiology - EKG 12-Lead - CBC w/Diff; Future - Comp Met (CMET); Future  6. Decreased hemoglobin Hydrating well.  Repeat labs to  ensure improvement. - CBC w/Diff; Future - Comp Met (CMET); Future  7. Hypoalbuminemia Noted during hospitalization.  Repeat labs today. - CBC w/Diff; Future - Comp Met (CMET); Future  This visit occurred during the SARS-CoV-2 public health emergency.  Safety protocols were in place, including screening questions prior to the visit, additional usage of staff PPE, and extensive cleaning of exam room while observing appropriate contact time as indicated for disinfecting solutions.     Leeanne Rio, PA-C

## 2020-07-17 NOTE — Progress Notes (Signed)
Cardiology Office Note:   Date:  07/18/2020  NAME:  Martha Moss    MRN: [id number] DOB:  [birth year]   PCP:  Sheliah Hatch, MD  Cardiologist:  No primary care provider on file.   Referring MD: Waldon Merl, PA-C   Chief Complaint  Patient presents with  . Palpitations   History of Present Illness:   Martha Moss is a 54 y.o. female with a hx of depression, fibromyalgia, migraine who is being seen today for the evaluation of palpitations at the request of Sheliah Hatch, MD. Admitted in November with concerns for seizure. MR negative. EEG negative. Pseudoseizure possible per notes. Normal echo in hospital.   She reports for the last 2 weeks has had daily episodes of palpitations.  Can occur anytime.  Can also wake her up in the middle the night.  She reports she feels her heart racing for seconds to minutes.  No identifiable trigger.  Symptoms resolved without intervention.  She does not drink any caffeine.  She reports she does not use any energy drinks.  She is staying well-hydrated.  Her EKG today demonstrates normal sinus rhythm with no acute ischemic changes or evidence of prior infarction.  She is never had a heart attack or stroke.  She not diabetic.  Her only medical problems are depression and anxiety.  Apparently there is an issue with fibromyalgia.  She does not smoke, drink alcohol or use drugs.  She does work as a Chartered loss adjuster.  She reports she is not stressed out.  Apparently she has issues talking and balance issues since being in the hospital.  She is waiting to see neurology in the outpatient setting.  It appears her EEG and MRI were normal.  She has not had a stroke.  Unclear what is going on from a neurological standpoint.  Cardiovascular examination is normal.  She does report some associated symptoms of shortness of breath and chest tightness when her heart is racing.  She does not routinely have chest pain or pressure with exertion.  She does have  a strong family history of heart disease.  Her father had heart attack at 7.  She reports brothers and sisters who have had heart attacks as well.  Past Medical History: Past Medical History:  Diagnosis Date  . Depression   . Fibromyalgia   . GERD (gastroesophageal reflux disease)   . Migraine   . PONV (postoperative nausea and vomiting)     Past Surgical History: Past Surgical History:  Procedure Laterality Date  . BIOPSY  07/06/2020   Procedure: BIOPSY;  Surgeon: Shellia Cleverly, DO;  Location: [facility];  Service: Gastroenterology;;  . CHOLECYSTECTOMY    . ESOPHAGOGASTRODUODENOSCOPY Left 07/06/2020   Procedure: ESOPHAGOGASTRODUODENOSCOPY (EGD);  Surgeon: Shellia Cleverly, DO;  [facility];  Service: Gastroenterology;  Laterality: Left;  . ETHMOIDECTOMY Bilateral 09/09/2016   Procedure: ETHMOIDECTOMY;  Surgeon: Newman Pies, MD;  Location: [facility];  Service: ENT;  Laterality: Bilateral;  . FRONTAL SINUS EXPLORATION Bilateral 09/09/2016   Procedure: FRONTAL RECESS SINUS EXPLORATION;  Surgeon: Newman Pies, MD;  Location: [facility];  Service: ENT;  Laterality: Bilateral;  . MALONEY DILATION  07/06/2020   Procedure: MALONEY DILATION;  Surgeon: Shellia Cleverly, DO;  Location: [facility];  Service: Gastroenterology;;  . MAXILLARY ANTROSTOMY Bilateral 09/09/2016   Procedure: MAXILLARY ANTROSTOMY WITH TISSUE REMOVAL;  Surgeon: Newman Pies, MD;  Location: [facility];  Service: ENT;  Laterality: Bilateral;  .  OVARIAN CYST SURGERY    . SINUS ENDO W/FUSION Bilateral 09/09/2016   Procedure: ENDOSCOPIC SINUS SURGERY WITH NAVIGATION;  Surgeon: Newman Pies, MD;  Location: [facility];  Service: ENT;  Laterality: Bilateral;  . TOTAL VAGINAL HYSTERECTOMY    . TURBINATE REDUCTION Bilateral 09/09/2016   Procedure: BILATERAL TURBINATE REDUCTION;  Surgeon: Newman Pies, MD;  Location: [facility];  Service: ENT;  Laterality:  Bilateral;    Current Medications: Current Meds  Medication Sig  . buPROPion (WELLBUTRIN XL) 300 MG 24 hr tablet TAKE 1 TABLET BY MOUTH DAILY  . fexofenadine (ALLEGRA) 180 MG tablet Take 180 mg by mouth daily.  Marland Kitchen levocetirizine (XYZAL) 5 MG tablet Take 5 mg by mouth every evening.  . magnesium 30 MG tablet Take 30 mg by mouth every other day.  . Multiple Vitamin (MULTIVITAMIN WITH MINERALS) TABS tablet Take 1 tablet by mouth daily.  . pantoprazole (PROTONIX) 40 MG tablet Take 1 tablet (40 mg total) by mouth daily before breakfast.  . sucralfate (CARAFATE) 1 g tablet Take 1 g by mouth in the morning and at bedtime.  . topiramate (TOPAMAX) 200 MG tablet TAKE 1 TABLET BY MOUTH TWICE DAILY  . vitamin E 1000 UNIT capsule Take 1,000 Units by mouth daily.  . [DISCONTINUED] sucralfate (CARAFATE) 1 g tablet Take 1 tablet (1 g total) by mouth 4 (four) times daily -  with meals and at bedtime.     Allergies:    Bee venom, Azithromycin, Diphenhydramine hcl, Erythromycin, Latex, Morphine and related, Penicillins, Percocet [oxycodone-acetaminophen], Sulfonamide derivatives, and Tetracyclines & related   Social History: Social History   Socioeconomic History  . Marital status: Married    Spouse name: Not on file  . Number of children: 3  . Years of education: Not on file  . Highest education level: Not on file  Occupational History  . Occupation: pre Engineer, site  Tobacco Use  . Smoking status: Never Smoker  . Smokeless tobacco: Never Used  Vaping Use  . Vaping Use: Never used  Substance and Sexual Activity  . Alcohol use: Yes    Comment: rarely  . Drug use: No  . Sexual activity: Yes    Birth control/protection: Surgical  Other Topics Concern  . Not on file  Social History Narrative   Lives at home with husband    Right handed   Caffeine: none    Social Determinants of Health   Financial Resource Strain:   . Difficulty of Paying Living Expenses: Not on file  Food Insecurity:    . Worried About Programme researcher, broadcasting/film/video in the Last Year: Not on file  . Ran Out of Food in the Last Year: Not on file  Transportation Needs:   . Lack of Transportation (Medical): Not on file  . Lack of Transportation (Non-Medical): Not on file  Physical Activity:   . Days of Exercise per Week: Not on file  . Minutes of Exercise per Session: Not on file  Stress:   . Feeling of Stress : Not on file  Social Connections:   . Frequency of Communication with Friends and Family: Not on file  . Frequency of Social Gatherings with Friends and Family: Not on file  . Attends Religious Services: Not on file  . Active Member of Clubs or Organizations: Not on file  . Attends Banker Meetings: Not on file  . Marital Status: Not on file     Family History: The patient's family history  includes Diabetes in her maternal grandmother; Heart attack (age of onset: 6434) in her father; Heart disease in her brother; Kidney Stones in her daughter; Migraines in her brother, mother, and another family member; Stroke in her brother and sister.  ROS:   All other ROS reviewed and negative. Pertinent positives noted in the HPI.     EKGs/Labs/Other Studies Reviewed:   The following studies were personally reviewed by me today:  EKG:  EKG is ordered today.  The ekg ordered today demonstrates normal sinus rhythm, heart rate 95, low voltage ECG, and was personally reviewed by me.   TTE 07/04/2020 1. Left ventricular ejection fraction, by estimation, is 50 to 55%. The  left ventricle has low normal function. The left ventricle has no regional  wall motion abnormalities. Left ventricular diastolic parameters were  normal.  2. Right ventricular systolic function is normal. The right ventricular  size is normal.  3. A small pericardial effusion is present.  4. The mitral valve is normal in structure. Trivial mitral valve  regurgitation. No evidence of mitral stenosis.  5. The aortic valve is grossly  normal. Aortic valve regurgitation is not  visualized. No aortic stenosis is present.  6. The inferior vena cava is normal in size with greater than 50%  respiratory variability, suggesting right atrial pressure of 3 mmHg.  Recent Labs: 07/04/2020: TSH 0.913 07/06/2020: Magnesium 2.0 07/17/2020: ALT 17; BUN 22; Creatinine, Ser 1.06; Hemoglobin 14.2; Platelets 301.0; Potassium 3.8; Sodium 142   Recent Lipid Panel    Component Value Date/Time   CHOL 158 11/20/2016 0921   TRIG 81.0 11/20/2016 0921   HDL 62.30 11/20/2016 0921   CHOLHDL 3 11/20/2016 0921   VLDL 16.2 11/20/2016 0921   LDLCALC 79 11/20/2016 0921    Physical Exam:   VS:  BP 124/84   Pulse 95   Ht 5\' 2"  (1.575 m)   Wt 123 lb (55.8 kg)   BMI 22.50 kg/m    Wt Readings from Last 3 Encounters:  07/18/20 123 lb (55.8 kg)  07/17/20 122 lb (55.3 kg)  07/03/20 121 lb 4.1 oz (55 kg)    General: Well nourished, well developed, in no acute distress Heart: Atraumatic, normal size  Eyes: PEERLA, EOMI  Neck: Supple, no JVD Endocrine: No thryomegaly Cardiac: Normal S1, S2; RRR; no murmurs, rubs, or gallops Lungs: Clear to auscultation bilaterally, no wheezing, rhonchi or rales  Abd: Soft, nontender, no hepatomegaly  Ext: No edema, pulses 2+ Musculoskeletal: No deformities, BUE and BLE strength normal and equal Skin: Warm and dry, no rashes   Neuro: Alert and oriented to person, place, time, and situation, CNII-XII grossly intact, no focal deficits  Psych: Normal mood and affect   ASSESSMENT:   Martha Moss is a 54 y.o. female who presents for the following: 1. Palpitations     PLAN:   1. Palpitations -Daily symptoms of palpitations.  EKG is normal.  Recent echocardiogram from the hospital was normal.  Cardiovascular examination is normal today.  Recent thyroid studies show TSH of 0.9.  She is not anemic and her hemoglobin is normal.  She reports no increased stress.  We need to exclude arrhythmia with a 7-day  Zio patch.  I do not need any blood work today.  I do not understand how her cardiovascular symptoms relate to what is going on neurologically.  It is reassuring that her brain MRI was normal.  Her EEG was also normal as well.  Symptoms could just be related  to stress.  We will proceed with excluding arrhythmia with a monitor.  Disposition: Return if symptoms worsen or fail to improve.  Medication Adjustments/Labs and Tests Ordered: Current medicines are reviewed at length with the patient today.  Concerns regarding medicines are outlined above.  Orders Placed This Encounter  Procedures  . LONG TERM MONITOR (3-14 DAYS)  . EKG 12-Lead   No orders of the defined types were placed in this encounter.   Patient Instructions  Medication Instructions:  Continue same medications   Lab Work: None ordered   Testing/Procedures: 1 week Zio Monitor   Follow-Up: At [facility], you and your health needs are our priority.  As part of our continuing mission to provide you with exceptional heart care, we have created designated Provider Care Teams.  These Care Teams include your primary Cardiologist (physician) and Advanced Practice Providers (APPs -  Physician Assistants and Nurse Practitioners) who all work together to provide you with the care you need, when you need it.  We recommend signing up for the patient portal called "MyChart".  Sign up information is provided on this After Visit Summary.  MyChart is used to connect with patients for Virtual Visits (Telemedicine).  Patients are able to view lab/test results, encounter notes, upcoming appointments, etc.  Non-urgent messages can be sent to your provider as well.   To learn more about what you can do with MyChart, go to [email or web address].    Your next appointment:  As Needed   The format for your next appointment: Office     Provider: Dr.O'Neal        Signed, Lenna Gilford. Flora Lipps, MD [facility]  [address] [place], [place] [address] [phone]  07/18/2020 9:57 AM

## 2020-07-18 ENCOUNTER — Ambulatory Visit (INDEPENDENT_AMBULATORY_CARE_PROVIDER_SITE_OTHER): Payer: Self-pay | Admitting: Cardiovascular Disease

## 2020-07-18 ENCOUNTER — Encounter: Payer: Self-pay | Admitting: Cardiovascular Disease

## 2020-07-18 VITALS — BP 124/84 | HR 95 | Ht 62.0 in | Wt 123.0 lb

## 2020-07-18 DIAGNOSIS — R002 Palpitations: Secondary | ICD-10-CM

## 2020-07-18 NOTE — Patient Instructions (Signed)
Medication Instructions:  Continue same medications   Lab Work: None ordered   Testing/Procedures: 1 week Zio Monitor   Follow-Up: At [facility], you and your health needs are our priority.  As part of our continuing mission to provide you with exceptional heart care, we have created designated Provider Care Teams.  These Care Teams include your primary Cardiologist (physician) and Advanced Practice Providers (APPs -  Physician Assistants and Nurse Practitioners) who all work together to provide you with the care you need, when you need it.  We recommend signing up for the patient portal called "MyChart".  Sign up information is provided on this After Visit Summary.  MyChart is used to connect with patients for Virtual Visits (Telemedicine).  Patients are able to view lab/test results, encounter notes, upcoming appointments, etc.  Non-urgent messages can be sent to your provider as well.   To learn more about what you can do with MyChart, go to [email or web address].    Your next appointment:  As Needed   The format for your next appointment: Office     Provider: Dr.O'Neal

## 2020-07-22 ENCOUNTER — Ambulatory Visit (INDEPENDENT_AMBULATORY_CARE_PROVIDER_SITE_OTHER): Payer: Self-pay

## 2020-07-22 DIAGNOSIS — R002 Palpitations: Secondary | ICD-10-CM

## 2020-07-31 ENCOUNTER — Ambulatory Visit (INDEPENDENT_AMBULATORY_CARE_PROVIDER_SITE_OTHER): Payer: Self-pay | Admitting: Nurse Practitioner

## 2020-07-31 ENCOUNTER — Encounter: Payer: Self-pay | Admitting: Nurse Practitioner

## 2020-07-31 VITALS — BP 128/70 | HR 84 | Ht 62.0 in | Wt 128.0 lb

## 2020-07-31 DIAGNOSIS — K219 Gastro-esophageal reflux disease without esophagitis: Secondary | ICD-10-CM

## 2020-07-31 DIAGNOSIS — R1013 Epigastric pain: Secondary | ICD-10-CM

## 2020-07-31 NOTE — Progress Notes (Signed)
07/31/2020 Martha Moss [id number] 07-05-1966   Chief Complaint: RUQ pain, hospital follow up   History of Present Illness: Martha Moss is a 54 year old female with a past medical history of migraine headaches, depression, fibromyalgia, interstitial cystitis, benign liver cysts  and GERD. Past cholecystectomy and partial hysterectomy. She was admitted to the hospital 07/03/2020 with new onset seizures which occurred while she was in her neurologist's office. She was undergoing neuro evaluation after being seen in the ED 11/20 for slurred speech and confusion. A brain MRI at that time was negative. Our Gi service evaluated the patient during her 11/23 admission due to having RUQ pain for a few months and dysphagia. An abd/pelvic CT showed stable liver cysts, no findings to explain her RUQ pain. She underwent an EGD by Dr. Barron Alvine 07/09/2020 which showed mild reflux, no evidence of Barrett's esophagus. The esophagus was dilated. Reactive gastropathy without evidence of H. Pylori. She was discharged home on Pantoprazole 40mg  Q am, Famotidine 20mg  Q am and Carafate 1gm po qid. The Carafate worsened her RUQ pain so she stopped taking it.  Currently, she continues to a low level of constant  RUQ pain. Pain is sometimes sharp and sometimes worse after eating. No specific food triggers. She stated her RUQ is similar to the pain she had prior to having her gallbladder surgery. No N/V. She is passing normal formed BMs daily. No rectal bleeding or melena. No further seizure activity since her hospital discharge. She is scheduled to see a neurologist in [place] in January 2022.    CBC Latest Ref Rng & Units 07/17/2020 07/06/2020 07/05/2020  WBC 4.0 - 10.5 K/uL 7.9 5.7 4.9  Hemoglobin 12.0 - 15.0 g/dL 07/08/2020 11.2(L) 12.4  Hematocrit 36.0 - 46.0 % 42.6 34.6(L) 38.5  Platelets 150.0 - 400.0 K/uL 301.0 239 252   CMP Latest Ref Rng & Units 07/17/2020 07/06/2020 07/05/2020  Glucose 70 - 99  mg/dL 84 07/08/2020) 07/07/2020)  BUN 6 - 23 mg/dL 22 13 9   Creatinine 0.40 - 1.20 mg/dL 322(G 254(Y  Sodium 135 - 145 mEq/L 142 143 142  Potassium 3.5 - 5.1 mEq/L 3.8 3.5 3.3(L)  Chloride 96 - 112 mEq/L 108 112(H) 113(H)  CO2 19 - 32 mEq/L 26 21(L) 19(L)  Calcium 8.4 - 10.5 mg/dL 9.5 9.1 8.9  Total Protein 6.0 - 8.3 g/dL 8.0 6.0(L) 6.4(L)  Total Bilirubin 0.2 - 1.2 mg/dL 0.2 0.4 0.5  Alkaline Phos 39 - 117 U/L 76 50 58  AST 0 - 37 U/L 15 13(L) 18  ALT 0 - 35 U/L 17 13 13     CTAP with contrast 07/03/2020: 1. Evidence of prior cholecystectomy and hysterectomy. 2. Stable hepatic cysts.  EGD 07/06/2020: - Normal esophagus. Dilated. Biopsied. - Z-line regular, 40 cm from the incisors. - Normal stomach. Biopsied. - Normal examined duodenum. Biopsied. Biopsy report: A. DUODENUM, BIOPSY:  - Benign small bowel mucosa.  - No villous blunting or increase in intraepithelial lymphocytes.  - No dysplasia or malignancy.  B. STOMACH, BIOPSY:  - Reactive gastropathy with erosions.  - Warthin-Starry is negative for Helicobacter pylori.  - No intestinal metaplasia, dysplasia, or malignancy.  C. ESOPHAGUS, DISTAL, BIOPSY:  - Mild reflux changes.  - No intestinal metaplasia, dysplasia, or malignancy. D. ESOPHAGUS, PROXIMAL, BIOPSY:  - Benign squamous mucosa.  - No increase in intraepithelial eosinophils.  - No intestinal metaplasia, dysplasia, or malignancy.   Current Outpatient Medications on File Prior to Visit  Medication  Sig Dispense Refill  . buPROPion (WELLBUTRIN XL) 300 MG 24 hr tablet TAKE 1 TABLET BY MOUTH DAILY 90 tablet 1  . fexofenadine (ALLEGRA) 180 MG tablet Take 180 mg by mouth daily.    Marland Kitchen levocetirizine (XYZAL) 5 MG tablet Take 5 mg by mouth every evening.    . magnesium 30 MG tablet Take 30 mg by mouth every other day.    . Multiple Vitamin (MULTIVITAMIN WITH MINERALS) TABS tablet Take 1 tablet by mouth daily.    . pantoprazole (PROTONIX) 40 MG tablet Take 1 tablet (40 mg total)  by mouth daily before breakfast. 30 tablet 2  . topiramate (TOPAMAX) 200 MG tablet TAKE 1 TABLET BY MOUTH TWICE DAILY 180 tablet 0  . vitamin E 1000 UNIT capsule Take 1,000 Units by mouth daily.    . Famotidine (PEPCID PO) Take by mouth at bedtime. Taking once daily, patient does not recall the dose     No current facility-administered medications on file prior to visit.   Allergies  Allergen Reactions  . Bee Venom   . Azithromycin     REACTION: hives  . Diphenhydramine Hcl     REACTION: hives  . Erythromycin     REACTION: hives  . Latex     REACTION: rash  . Morphine And Related Nausea And Vomiting  . Penicillins     REACTION: rash, fever  . Percocet [Oxycodone-Acetaminophen] Other (See Comments)    Hallucinations   . Sulfonamide Derivatives     REACTION: rash, fever  . Tetracyclines & Related      Current Medications, Allergies, Past Medical History, Past Surgical History, Family History and Social History were reviewed in [employer] record.   Review of Systems:   Constitutional: Negative for fever, sweats, chills or weight loss.  Respiratory: Negative for shortness of breath.   Cardiovascular: Negative for chest pain, palpitations and leg swelling.  Gastrointestinal: See HPI.  Musculoskeletal: Negative for back pain or muscle aches.  Neurological: See HPI.   Physical Exam: BP 128/70   Pulse 84   Ht 5\' 2"  (1.575 m)   Wt 58.1 kg   SpO2 98%   BMI 23.41 kg/m   Wt Readings from Last 3 Encounters:  07/31/20 58.1 kg  07/18/20 55.8 kg  07/17/20 55.3 kg   General: Well developed 54 year old female in no acute distress. Head: Normocephalic and atraumatic. Eyes: No scleral icterus. Conjunctiva pink . Ears: Normal auditory acuity. Lungs: Clear throughout to auscultation. Heart: Regular rate and rhythm, no murmur. Abdomen: Soft, nontender and nondistended. No masses or hepatomegaly. Normal bowel sounds x 4 quadrants.  Rectal: Deferred.   Musculoskeletal: Symmetrical with no gross deformities. Extremities: No edema. Neurological: Alert oriented x 4. No focal deficits.  Psychological: Alert and cooperative. Normal mood and affect  Assessment and Recommendations:  51. 54 year old female with a history of GERD, dysphagia and RUQ pain. CTAP 07/03/2020 findings did not explain her pain, no biliary duct dilatation s/p CCY. EGD 07/06/2020 showed mild reflux and reactive gastropathy with erosions, no evidence of H. Pylori.  -Continue Pantoprazole 40mg  QA. Change Pepcid 20mg  to Q HS -GERD diet discussed -Patient to call office if symptoms worsen   2. Colon cancer screening. Colonoscopy in 2003 or 2004 by Dr. , results not in Epic or Care everywhere. -Schedule near future colonoscopy after neuro evaluation completed -Patient to follow up in office with Dr. 2004 in 6 weeks   3. New onset seizure disorder  -Proceed  with neuro follow up a scheduled   4. Benign 3.8 x 2.8 cm liver dome cyst. No further work up required for a benign liver cyst. However, in the setting of unexplained RUQ pain may consider eventual abdominal MRI.

## 2020-07-31 NOTE — Patient Instructions (Signed)
Take your famotidine 20 mg at bedtime and continue pantoprazole 40 mg every morning.   You have been given information on GERD.

## 2020-08-01 NOTE — Progress Notes (Signed)
Agree with the assessment and plan as outlined by Colleen Kennedy-Smith, NP.   Vienna Folden, DO, FACG [facility]   

## 2020-09-04 ENCOUNTER — Other Ambulatory Visit: Payer: Self-pay

## 2020-09-04 DIAGNOSIS — Z20822 Contact with and (suspected) exposure to covid-19: Secondary | ICD-10-CM

## 2020-09-06 ENCOUNTER — Telehealth (INDEPENDENT_AMBULATORY_CARE_PROVIDER_SITE_OTHER): Payer: Self-pay | Admitting: Physician Assistant

## 2020-09-06 ENCOUNTER — Other Ambulatory Visit: Payer: Self-pay

## 2020-09-06 ENCOUNTER — Encounter: Payer: Self-pay | Admitting: Physician Assistant

## 2020-09-06 DIAGNOSIS — Z20822 Contact with and (suspected) exposure to covid-19: Secondary | ICD-10-CM

## 2020-09-06 LAB — SARS-COV-2, NAA 2 DAY TAT

## 2020-09-06 LAB — NOVEL CORONAVIRUS, NAA: SARS-CoV-2, NAA: DETECTED — AB

## 2020-09-06 MED ORDER — BENZONATATE 100 MG PO CAPS: 100.0000 mg | ORAL_CAPSULE | Freq: Three times a day (TID) | ORAL | 0 refills | Status: DC | PRN

## 2020-09-06 MED ORDER — FLUTICASONE PROPIONATE 50 MCG/ACT NA SUSP: 2.0000 | Freq: Every day | NASAL | 0 refills | Status: DC

## 2020-09-06 NOTE — Patient Instructions (Signed)
Instructions sent to patients MyChart.

## 2020-09-06 NOTE — Progress Notes (Signed)
Virtual Visit via Telephone Note  I connected with Martha Moss on 09/06/20 at 11:30 AM EST by telephone and verified that I am speaking with the correct person using two identifiers.  Location: Patient: Home Provider: [facility]   I discussed the limitations, risks, security and privacy concerns of performing an evaluation and management service by telephone and the availability of in person appointments. I also discussed with the patient that there may be a patient responsible charge related to this service. The patient expressed understanding and agreed to proceed.  History of Present Illness: Patient endorses she woke up Tuesday with a headache. By the end of the day she states she felt like she had been hit by a truck.  Noted fever with T-max of 102, chills and body aches.  Since then has developed nasal congestion, sinus pressure and a cough that is mainly dry.  Has noted some abdominal cramping and loose stools.  Denies melena, hematochezia or tenesmus.  Denies any chest pain or shortness of breath.  Has been taking Tylenol which keeps her fever down.  That she has been around people with Covid but has tried to keep mass.  Went for testing yesterday.  Is still waiting for her results.  Observations/Objective: No labored breathing.  Speech is clear and coherent with logical content.  Patient is alert and oriented at baseline.    Assessment and Plan: 1. Suspected COVID-19 virus infection Patient has already had testing.  Results pending.  She is to quarantine until results are in and next steps are determined.  Strong suspicion for Covid.  We will have her hydrate and rest.  Can continue Tylenol if needed for headache, body aches or fever.  Start vitamin D, vitamin C and zinc as discussed.  Saline nasal rinses recommended.  Will start Flonase and Tessalon.  Strict ER precautions discussed with patient.  Patient has been enrolled in a Covid symptom  monitoring program that she is to fill it every day so we can keep it track of how she is feeling.  Patient voiced understanding and agreement with plan.   Follow Up Instructions:    I discussed the assessment and treatment plan with the patient. The patient was provided an opportunity to ask questions and all were answered. The patient agreed with the plan and demonstrated an understanding of the instructions.   The patient was advised to call back or seek an in-person evaluation if the symptoms worsen or if the condition fails to improve as anticipated.  I provided 10 minutes of non-face-to-face time during this encounter.   Piedad Climes, PA-C

## 2020-09-17 ENCOUNTER — Ambulatory Visit: Payer: Self-pay | Admitting: Family

## 2020-09-17 ENCOUNTER — Ambulatory Visit: Payer: Self-pay | Admitting: Gastroenterology

## 2020-09-20 ENCOUNTER — Ambulatory Visit: Payer: Self-pay | Admitting: Gastroenterology

## 2020-10-09 ENCOUNTER — Ambulatory Visit: Payer: 59 | Admitting: Gastroenterology

## 2020-10-09 ENCOUNTER — Encounter: Payer: Self-pay | Admitting: Gastroenterology

## 2020-10-09 VITALS — BP 116/76 | HR 80 | Ht 62.0 in | Wt 123.2 lb

## 2020-10-09 DIAGNOSIS — Z1211 Encounter for screening for malignant neoplasm of colon: Secondary | ICD-10-CM

## 2020-10-09 DIAGNOSIS — R1319 Other dysphagia: Secondary | ICD-10-CM | POA: Diagnosis not present

## 2020-10-09 DIAGNOSIS — K219 Gastro-esophageal reflux disease without esophagitis: Secondary | ICD-10-CM | POA: Diagnosis not present

## 2020-10-09 MED ORDER — PANTOPRAZOLE SODIUM 40 MG PO TBEC: DELAYED_RELEASE_TABLET | ORAL | 3 refills | Status: DC

## 2020-10-09 NOTE — Progress Notes (Signed)
P  Chief Complaint:    GERD  GI History: 55 year old female with a history of migraines, depression, fibromyalgia, interstitial cystitis, ccy, partial hysterectomy, benign liver cysts, admitted on 07/03/2020 with new onset seizures.  GI service was consulted due to RUQ pain times months and dysphagia. -CT A/P: Stable 3.8 x 2.8 cm cystic areas in the posterior liver dome with adjacent stable subcentimeter liver cyst. Similar to 06/2018. -EGD (07/09/2020, Dr. Barron Alvine): Normal Z-line, normal esophagus with empiric 54 French Maloney dilation with mucosal rent at 18 cm c/w successful proximal dilation.  Esophageal biopsies with mild reflux changes in distal esophagus; normal proximal.  Non-H. pylori gastritis.  Started pantoprazole 40 mg/day, famotidine 20 mg every morning, Carafate 1 g qid. -Worsening abdominal pain with Carafate so this was discontinued -Follow-up in GI clinic on 07/31/2020 with continued mild RUQ pain, sometimes worse after eating.  No further seizure activity.  HPI:     Patient is a 55 y.o. female presenting to the Gastroenterology Clinic for follow-up.  Last seen in the office by Alcide Evener on 07/31/2020.  Continue Protonix, and changed timing of Pepcid to qhs.  Today, she states she is feeling much better.  Reflux generally well controlled with intermittent breakthrough, mainly with dietary indiscretions. Dysphagia much improved with Maloney dilation.   Has Neurology f/u on 10/19/20, but daughter being induced that day, so will likely reschedule.   Did have Covid at the end of January but did not require hospital admission.  Review of systems:     No chest pain, no SOB, no fevers, no urinary sx   Past Medical History:  Diagnosis Date  . Depression   . Fibromyalgia   . GERD (gastroesophageal reflux disease)   . Migraine   . PONV (postoperative nausea and vomiting)     Patient's surgical history, family medical history, social history, medications  and allergies were all reviewed in Epic    Current Outpatient Medications  Medication Sig Dispense Refill  . benzonatate (TESSALON) 100 MG capsule Take 1 capsule (100 mg total) by mouth 3 (three) times daily as needed. 30 capsule 0  . buPROPion (WELLBUTRIN XL) 300 MG 24 hr tablet TAKE 1 TABLET BY MOUTH DAILY 90 tablet 1  . Famotidine (PEPCID PO) Take by mouth at bedtime. Taking once daily, patient does not recall the dose    . fexofenadine (ALLEGRA) 180 MG tablet Take 180 mg by mouth daily.    . fluticasone (FLONASE) 50 MCG/ACT nasal spray Place 2 sprays into both nostrils daily. 16 g 0  . levocetirizine (XYZAL) 5 MG tablet Take 5 mg by mouth every evening.    . magnesium 30 MG tablet Take 30 mg by mouth every other day.    . Multiple Vitamin (MULTIVITAMIN WITH MINERALS) TABS tablet Take 1 tablet by mouth daily.    . pantoprazole (PROTONIX) 40 MG tablet Take 1 tablet (40 mg total) by mouth daily before breakfast. 30 tablet 2  . topiramate (TOPAMAX) 200 MG tablet TAKE 1 TABLET BY MOUTH TWICE DAILY 180 tablet 0  . vitamin E 1000 UNIT capsule Take 1,000 Units by mouth daily.     No current facility-administered medications for this visit.    Physical Exam:     There were no vitals taken for this visit.  GENERAL:  Pleasant female in NAD PSYCH: : Cooperative, normal affect NEURO: Alert and oriented x 3, no focal neurologic deficits   IMPRESSION and PLAN:    1) GERD -Reflux symptoms generally  well controlled on current therapy, but still with intermittent breakthrough.  Plan for increasing acid suppression therapy for a brief course then titrate to lowest effective dose -Protonix 40 mg BID x4 weeks, then daily -Continue Pepcid qhs -Continue antireflux lifestyle/dietary modifications with avoidance of exacerbating foods  2) Dysphagia -Much improved with esophageal dilation  3) Colon cancer [place] - Last colonoscopy was 2004.  -Needs colonoscopy for routine CRC screening pending  Neurology follow-up and clearance          Martha Moss ,DO, FACG 10/09/2020, 1:50 PM

## 2020-10-09 NOTE — Patient Instructions (Signed)
If you are age 55 or older, your body mass index should be between 23-30. Your Body mass index is 22.54 kg/m. If this is out of the aforementioned range listed, please consider follow up with your Primary Care Provider.  If you are age 1 or younger, your body mass index should be between 19-25. Your Body mass index is 22.54 kg/m. If this is out of the aformentioned range listed, please consider follow up with your Primary Care Provider.   We have sent the following medications to your pharmacy for you to pick up at your convenience:  Protonix 40mg  twice a day for 4 weeks then once daily thereafter.  Please call to schedule a Follow up in 6 months-[phone]  Due to recent changes in healthcare laws, you may see the results of your imaging and laboratory studies on MyChart before your provider has had a chance to review them.  We understand that in some cases there may be results that are confusing or concerning to you. Not all laboratory results come back in the same time frame and the provider may be waiting for multiple results in order to interpret others.  Please give [phone] 48 hours in order for your provider to thoroughly review all the results before contacting the office for clarification of your results.   Thank you for choosing me and [facility].  Vito Cirigliano, D.O.

## 2020-10-30 ENCOUNTER — Other Ambulatory Visit: Payer: Self-pay

## 2020-10-30 ENCOUNTER — Ambulatory Visit: Payer: 59 | Attending: Neurology | Admitting: Speech Pathology

## 2020-10-30 DIAGNOSIS — R4782 Fluency disorder in conditions classified elsewhere: Secondary | ICD-10-CM | POA: Diagnosis not present

## 2020-10-30 NOTE — Patient Instructions (Signed)
Before you start practicing, take 5-10 slow, deep breaths.   Keep reading out loud, but focus on the following: Slow, Loud, Pause  Martha Moss Martha Moss Martha Moss Martha Moss Martha Moss Coder  [facility]  [place] Are you picking up your medication? What's your name and birthdate?   List some common medications you talk about and/OR ones that give you trouble, or any other words you find yourself stumbling over. Bring this list with you to therapy.  Metformin Ozempic

## 2020-10-30 NOTE — Therapy (Signed)
[facility] [address] , [place], [address] Phone: [phone]   Fax:  [phone]  Speech Language Pathology Evaluation  Patient Details  Name: Martha Moss MRN: [id number] Date of Birth: 06/29/1966 Referring Provider (SLP): Dr. Sherryll BurgerShah   Encounter Date: 10/30/2020   End of Session - 10/30/20 1724    Visit Number 1    Number of Visits 9    Date for SLP Re-Evaluation 12/29/20    Authorization - Visit Number 1    Progress Note Due on Visit 10    SLP Start Time 1405    SLP Stop Time  1500    SLP Time Calculation (min) 55 min    Activity Tolerance Patient tolerated treatment well           Past Medical History:  Diagnosis Date  . Depression   . Fibromyalgia   . GERD (gastroesophageal reflux disease)   . Migraine   . PONV (postoperative nausea and vomiting)     Past Surgical History:  Procedure Laterality Date  . BIOPSY  07/06/2020   Procedure: BIOPSY;  Surgeon: Shellia Cleverlyirigliano, Vito V, DO;  Location: [facility];  Service: Gastroenterology;;  . CHOLECYSTECTOMY    . ESOPHAGOGASTRODUODENOSCOPY Left 07/06/2020   Procedure: ESOPHAGOGASTRODUODENOSCOPY (EGD);  Surgeon: Shellia Cleverlyirigliano, Vito V, DO;  [facility];  Service: Gastroenterology;  Laterality: Left;  . ETHMOIDECTOMY Bilateral 09/09/2016   Procedure: ETHMOIDECTOMY;  Surgeon: Newman PiesSu Teoh, MD;  Location: [facility];  Service: ENT;  Laterality: Bilateral;  . FRONTAL SINUS EXPLORATION Bilateral 09/09/2016   Procedure: FRONTAL RECESS SINUS EXPLORATION;  Surgeon: Newman PiesSu Teoh, MD;  Location: [facility];  Service: ENT;  Laterality: Bilateral;  . MALONEY DILATION  07/06/2020   Procedure: MALONEY DILATION;  Surgeon: Shellia Cleverlyirigliano, Vito V, DO;  Location: [facility];  Service: Gastroenterology;;  . MAXILLARY ANTROSTOMY Bilateral 09/09/2016   Procedure: MAXILLARY ANTROSTOMY WITH TISSUE REMOVAL;  Surgeon: Newman PiesSu Teoh, MD;  Location: [facility];  Service: ENT;  Laterality: Bilateral;  . OVARIAN CYST SURGERY    . SINUS ENDO W/FUSION Bilateral 09/09/2016   Procedure: ENDOSCOPIC SINUS SURGERY WITH NAVIGATION;  Surgeon: Newman PiesSu Teoh, MD;  Location: [facility];  Service: ENT;  Laterality: Bilateral;  . TOTAL VAGINAL HYSTERECTOMY    . TURBINATE REDUCTION Bilateral 09/09/2016   Procedure: BILATERAL TURBINATE REDUCTION;  Surgeon: Newman PiesSu Teoh, MD;  Location: [facility];  Service: ENT;  Laterality: Bilateral;    There were no vitals filed for this visit.   Subjective Assessment - 10/30/20 1750    Subjective "My tongue will get tied up."    Currently in Pain? No/denies              SLP Evaluation [facility] - 10/30/20 1411      SLP Visit Information   SLP Received On 10/30/20    Referring Provider (SLP) Dr. Sherryll BurgerShah    Onset Date 07/03/2020    Medical Diagnosis speech disturbance      General Information   HPI Martha PerchesSherry Moss is a 55 y.o. female referred by Dr. Sherryll BurgerShah for stuttering and "spells" of slowed speech felt to be functional neurological disorder vs complex migraine. Past medical history is noted for migraines, depression, fibromyalgia, interstitial cystitis. Pt had episode of "not feeling like herself," and stuttering; went to ED on 07/03/20; MRI brain showed no acute stroke, EEG was normal; prolonged EEG showed one non-epileptic event during which she was noted to be non-verbal.  Behavioral/Cognition alert, cooperative    Mobility Status ambulated unassisted      Balance Screen   Has the patient fallen in the past 6 months No   had syncopal episode in MD office but was sitting   Has the patient had a decrease in activity level because of a fear of falling?  No    Is the patient reluctant to leave their home because of a fear of falling?  No      Prior Functional Status   Cognitive/Linguistic Baseline Within functional limits      Auditory Comprehension   Overall Auditory Comprehension  Appears within functional limits for tasks assessed      Visual Recognition/Discrimination   Discrimination Not tested      Reading Comprehension   Reading Status Within funtional limits      Expression   Primary Mode of Expression Verbal      Verbal Expression   Overall Verbal Expression Appears within functional limits for tasks assessed      Written Expression   Dominant Hand Right    Written Expression Not tested      Oral Motor/Sensory Function   Overall Oral Motor/Sensory Function Appears within functional limits for tasks assessed      Motor Speech   Overall Motor Speech Impaired    Respiration Within functional limits    Phonation Breathy;Low vocal intensity   high pitch; pt report these are baseline for her   Resonance Within functional limits    Articulation Impaired    Level of Impairment Conversation    Intelligibility Intelligible   in a quiet environment; would likely have difficulty with noise   Motor Planning Witnin functional limits    Motor Speech Errors Aware    Effective Techniques Slow rate    Phonation WFL      Standardized Assessments   Standardized Assessments  Other Assessment       Motor Speech Evaluation  Oral motor assessment revealed WNL lingual ROM and WNL lingual strength. Labial ROM was WNL and strength was WNL. Velar ROM appeared WNL.   Measured 30 cm away from pt's mouth, 4 minutes of conversational speech was reduced today, at average 68 dB (WNL= average 70-72dB). Overall speech intelligibility for this listener in a quiet environment was not affected, at approx 100%. Pitch averaged 309 Hz, 2.4 SD above norm of 244 Hz+/27 for females. Pt read words and sentences without dysfluencies; only 1 instance noted in spontaneous conversation. With paragraph level reading, dysfluencies occurred at a rate of 5%; sound/syllable repetitions occurred as well as transposition. 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   Communication Effectiveness Survey 26/32 (higher score =more effective) Having conversation with family member or friends at home   4 Participating in conversation with strangers in a quiet place   4 Conversing with familiar person over the phone                        4 Conversing with stranger over the phone                                    3 Being part of a conversation in a noisy environment                 3 Speaking to a friend when angry or  upset                                  3  Having a conversation in a car                                                     3 Having a conversation at a distance                                           2                    SLP Education - 10/30/20 1723    Education Details slow rate, pause    Person(s) Educated Patient    Methods Explanation    Comprehension Verbalized understanding            SLP Short Term Goals - 10/30/20 1726      SLP SHORT TERM GOAL #1   Title Patient will use fluency compensations with multisyllabic words for >95% accuracy.    Time 4    Period Weeks    Status New      SLP SHORT TERM GOAL #2   Title Pt will demo HEP for fluency with modified independence.    Time 4    Period Weeks    Status New      SLP SHORT TERM GOAL #3   Title Patient will use fluency compensations in 5 minutes simple conversation, correcting errors with rare min A.    Time 4    Period Weeks    Status New            SLP Long Term Goals - 10/30/20 1729      SLP LONG TERM GOAL #1   Title Pt will use fluency compensations in 20 minutes mod complex conversation, self-correcting breakdowns as needed.    Time 8    Period Weeks    Status New      SLP LONG TERM GOAL #2   Title Pt will report improved communication effectiveness, as measured by Communication Effectiveness Survey.    Baseline 26/32 on 10/30/20 (higher score = more effective)    Time 8    Period Weeks     Status New      SLP LONG TERM GOAL #3   Title Pt will report confidence in communicating at work with use of fluency compensations.    Time 8    Period Weeks    Status New            Plan - 10/30/20 1739    Clinical Impression Statement Patient presents with minimal dysfluency with imprecise articulation, sound/syllable repetition occuring rarely in conversation and more pronounced when reading. Patient is soft-spoken with pitch above WNL for gender (309 Hz; 2.4 SD above norm); vocal quality is mildly breathy. Pt reports these vocal characteristics are normal for her. Her rate of speech is fairly rapid, particularly when appearing anxious. Her biggest concern is that she is mumbling; pt became tearful during assessment stating she's concerned about what others perceive about her speech. She finds herself stumbling over words: "my tongue will get tied  up," with longer words and when she is tired. This was pronounced moreso when reading than in spontaneous conversation during today's assessment. I recommend skilled ST to train pt in fluency compensations to improve confidence in communication for work and with family. Plan written for 8 weeks but anticipate d/c within 3-5 sessions.    Speech Therapy Frequency 1x /week    Duration 8 weeks    Treatment/Interventions SLP instruction and feedback;Environmental controls;Functional tasks;Compensatory strategies;Patient/family education    Potential to Achieve Goals Good    SLP Home Exercise Plan see pt instructions    Consulted and Agree with Plan of Care Patient           Patient will benefit from skilled therapeutic intervention in order to improve the following deficits and impairments:   Fluency disorder in conditions classified elsewhere    Problem List Patient Active Problem List   Diagnosis Date Noted  . Esophageal dysphagia   . Right upper quadrant abdominal pain   . Nausea without vomiting   . Seizure-like activity (HCC)  07/03/2020  . Psychogenic nonepileptic seizure 07/03/2020  . Seizure (HCC) 07/03/2020  . Hypotension 07/03/2020  . Altered mental status   . Neuropathy of both feet 12/22/2017  . Physical exam 09/25/2016  . Chronic idiopathic constipation 10/13/2014  . Contact dermatitis due to jewelry 02/19/2014  . Acute sinusitis 11/19/2013  . Insomnia 06/22/2013  . Pain in right wrist 06/22/2013  . Soft tissue mass 06/22/2013  . Plantar fasciitis of left foot 03/14/2013  . Shaky 02/22/2013  . Depression with anxiety 10/28/2010  . Migraine without aura 10/28/2010  . GERD 10/28/2010  . CHEST PAIN, ATYPICAL, HX OF 03/27/2010  . ALLERGIC RHINITIS, SEASONAL 12/07/2009  . Fibromyalgia 10/26/2009  . POLYARTHRITIS 10/19/2009   Rondel Baton, MS, CCC-SLP Speech-Language Pathologist  Arlana Lindau 10/30/2020, 5:52 PM  [facility] MAIN [facility] SERVICES [address] [place], [place], [address] Phone: [phone]   Fax:  [phone]  Name: Martha Moss MRN: [id number] Date of Birth: June 06, 1966

## 2020-11-01 ENCOUNTER — Encounter: Payer: 59 | Admitting: Speech Pathology

## 2020-11-05 ENCOUNTER — Encounter: Payer: 59 | Admitting: Speech Pathology

## 2020-11-06 ENCOUNTER — Encounter: Payer: 59 | Admitting: Speech Pathology

## 2020-11-07 ENCOUNTER — Ambulatory Visit: Payer: 59 | Admitting: Speech Pathology

## 2020-11-08 ENCOUNTER — Ambulatory Visit: Payer: 59 | Admitting: Speech Pathology

## 2020-11-13 ENCOUNTER — Encounter: Payer: 59 | Admitting: Speech Pathology

## 2020-11-13 ENCOUNTER — Ambulatory Visit: Payer: 59 | Admitting: Speech Pathology

## 2020-11-14 ENCOUNTER — Ambulatory Visit: Payer: 59 | Admitting: Speech Pathology

## 2020-11-15 ENCOUNTER — Other Ambulatory Visit: Payer: Self-pay | Admitting: Family Medicine

## 2020-11-15 ENCOUNTER — Encounter: Payer: 59 | Admitting: Speech Pathology

## 2020-11-20 ENCOUNTER — Ambulatory Visit: Payer: 59 | Admitting: Speech Pathology

## 2020-11-20 ENCOUNTER — Encounter: Payer: 59 | Admitting: Speech Pathology

## 2020-11-27 ENCOUNTER — Encounter: Payer: 59 | Admitting: Speech Pathology

## 2020-11-27 ENCOUNTER — Ambulatory Visit: Payer: 59 | Admitting: Speech Pathology

## 2020-11-29 ENCOUNTER — Encounter: Payer: 59 | Admitting: Speech Pathology

## 2020-12-04 ENCOUNTER — Ambulatory Visit: Payer: 59 | Admitting: Speech Pathology

## 2020-12-04 ENCOUNTER — Encounter: Payer: 59 | Admitting: Speech Pathology

## 2020-12-06 ENCOUNTER — Encounter: Payer: 59 | Admitting: Speech Pathology

## 2020-12-11 ENCOUNTER — Ambulatory Visit: Payer: 59 | Admitting: Speech Pathology

## 2020-12-18 ENCOUNTER — Ambulatory Visit: Payer: 59 | Admitting: Speech Pathology

## 2020-12-25 ENCOUNTER — Ambulatory Visit: Payer: 59 | Admitting: Speech Pathology

## 2021-01-01 ENCOUNTER — Ambulatory Visit: Payer: 59 | Admitting: Speech Pathology

## 2021-01-02 ENCOUNTER — Encounter: Payer: Self-pay | Admitting: Registered Nurse

## 2021-01-02 ENCOUNTER — Telehealth (INDEPENDENT_AMBULATORY_CARE_PROVIDER_SITE_OTHER): Payer: 59 | Admitting: Registered Nurse

## 2021-01-02 VITALS — Ht 62.0 in | Wt 120.0 lb

## 2021-01-02 DIAGNOSIS — B9689 Other specified bacterial agents as the cause of diseases classified elsewhere: Secondary | ICD-10-CM

## 2021-01-02 DIAGNOSIS — J019 Acute sinusitis, unspecified: Secondary | ICD-10-CM

## 2021-01-02 MED ORDER — LEVOFLOXACIN 500 MG PO TABS: 500.0000 mg | ORAL_TABLET | Freq: Every day | ORAL | 0 refills | Status: AC

## 2021-01-02 MED ORDER — MONTELUKAST SODIUM 10 MG PO TABS: 10.0000 mg | ORAL_TABLET | Freq: Every day | ORAL | 3 refills | Status: DC

## 2021-01-02 MED ORDER — PREDNISONE 10 MG (21) PO TBPK: ORAL_TABLET | ORAL | 0 refills | Status: DC

## 2021-01-02 NOTE — Progress Notes (Signed)
Telemedicine Encounter- SOAP NOTE Established Patient  This telephone encounter was conducted with the patient's (or proxy's) verbal consent via audio telecommunications: yes  Patient was instructed to have this encounter in a suitably private space; and to only have persons present to whom they give permission to participate. In addition, patient identity was confirmed by use of name plus two identifiers (DOB and address).  I discussed the limitations, risks, security and privacy concerns of performing an evaluation and management service by telephone and the availability of in person appointments. I also discussed with the patient that there may be a patient responsible charge related to this service. The patient expressed understanding and agreed to proceed.  I spent a total of 13 minutes talking with the patient or their proxy.  Patient at home Provider in office  Participants: [name], NP and [name]  Chief Complaint  Patient presents with  . Sinus Problem    Pt c/o sinus issues x 3-4 days since they cut they hay. Pt c/o headaches, facial pain and jaw pain. Having post nasal drip and started coughing this morning. Pt is taking Tylenol Sinud, Mucinex, Nasal Cort and using netty pot.    Subjective   Martha Moss is a 55 y.o. established patient. Telephone visit today for sinus issues  HPI Hx of many bacterial sinus infections, surgeries, and allergies.  Has been ongoing for around 4 days Has tried a number of OTCs without relief. No systemic symptoms No lower respiratory symptoms.  Otherwise no concerns.   Patient Active Problem List   Diagnosis Date Noted  . Esophageal dysphagia   . Right upper quadrant abdominal pain   . Nausea without vomiting   . Seizure-like activity (HCC) 07/03/2020  . Psychogenic nonepileptic seizure 07/03/2020  . Seizure (HCC) 07/03/2020  . Hypotension 07/03/2020  . Altered mental status   . Neuropathy of both feet  12/22/2017  . Physical exam 09/25/2016  . Chronic idiopathic constipation 10/13/2014  . Contact dermatitis due to jewelry 02/19/2014  . Acute sinusitis 11/19/2013  . Insomnia 06/22/2013  . Pain in right wrist 06/22/2013  . Soft tissue mass 06/22/2013  . Plantar fasciitis of left foot 03/14/2013  . Shaky 02/22/2013  . Depression with anxiety 10/28/2010  . Migraine without aura 10/28/2010  . GERD 10/28/2010  . CHEST PAIN, ATYPICAL, HX OF 03/27/2010  . ALLERGIC RHINITIS, SEASONAL 12/07/2009  . Fibromyalgia 10/26/2009  . POLYARTHRITIS 10/19/2009    Past Medical History:  Diagnosis Date  . Depression   . Fibromyalgia   . GERD (gastroesophageal reflux disease)   . Migraine   . PONV (postoperative nausea and vomiting)     Current Outpatient Medications  Medication Sig Dispense Refill  . buPROPion (WELLBUTRIN XL) 300 MG 24 hr tablet TAKE 1 TABLET BY MOUTH DAILY 90 tablet 0  . Famotidine (PEPCID PO) Take by mouth at bedtime. Taking once daily, patient does not recall the dose    . fexofenadine (ALLEGRA) 180 MG tablet Take 180 mg by mouth as directed. Monday Wednesday and Friday    . fluticasone (FLONASE) 50 MCG/ACT nasal spray Place 2 sprays into both nostrils daily. 16 g 0  . levocetirizine (XYZAL) 5 MG tablet Take 5 mg by mouth as directed. Tuesday Thursday Saturday and Sunday    . magnesium 30 MG tablet Take 30 mg by mouth every other day.    . Multiple Vitamin (MULTIVITAMIN WITH MINERALS) TABS tablet Take 1 tablet by mouth daily.    Marland Kitchen  vitamin E 1000 UNIT capsule Take 1,000 Units by mouth daily.    . pantoprazole (PROTONIX) 40 MG tablet Take 1 tablet (40 mg total) by mouth 2 (two) times daily for 30 days, THEN 1 tablet (40 mg total) daily. 90 tablet 3  . topiramate (TOPAMAX) 200 MG tablet TAKE 1 TABLET BY MOUTH TWICE DAILY (Patient not taking: Reported on 01/02/2021) 180 tablet 0   No current facility-administered medications for this visit.    Allergies  Allergen Reactions  .  Bee Venom   . Azithromycin     REACTION: hives  . Diphenhydramine Hcl     REACTION: hives  . Erythromycin     REACTION: hives  . Latex     REACTION: rash  . Morphine And Related Nausea And Vomiting  . Penicillins     REACTION: rash, fever  . Percocet [Oxycodone-Acetaminophen] Other (See Comments)    Hallucinations   . Sulfonamide Derivatives     REACTION: rash, fever  . Tetracyclines & Related     Social History   Socioeconomic History  . Marital status: Married    Spouse name: Not on file  . Number of children: 3  . Years of education: Not on file  . Highest education level: Not on file  Occupational History  . Occupation: pre Engineer, site  Tobacco Use  . Smoking status: Never Smoker  . Smokeless tobacco: Never Used  Vaping Use  . Vaping Use: Never used  Substance and Sexual Activity  . Alcohol use: Yes    Comment: rarely  . Drug use: No  . Sexual activity: Yes    Birth control/protection: Surgical  Other Topics Concern  . Not on file  Social History Narrative   Lives at home with husband    Right handed   Caffeine: none    Social Determinants of Health   Financial Resource Strain: Not on file  Food Insecurity: Not on file  Transportation Needs: Not on file  Physical Activity: Not on file  Stress: Not on file  Social Connections: Not on file  Intimate Partner Violence: Not on file    ROS Per hpi   Objective   Vitals as reported by the patient: Today's Vitals   01/02/21 0811  Weight: 120 lb (54.4 kg)  Height: 5\' 2"  (1.575 m)    Martha Moss was seen today for sinus problem.  Diagnoses and all orders for this visit:  Acute bacterial sinusitis -     levofloxacin (LEVAQUIN) 500 MG tablet; Take 1 tablet (500 mg total) by mouth daily for 7 days. -     predniSONE (STERAPRED UNI-PAK 21 TAB) 10 MG (21) TBPK tablet; Take per package instructions. Do not skip doses. Finish entire supply. -     montelukast (SINGULAIR) 10 MG tablet; Take 1 tablet (10 mg  total) by mouth at bedtime.    PLAN  Given hx will treat as bacterial. Given allergies will opt for levaquin. Reviewed r/b/se of this medication  Has not used singulair in the past. Will try this. Reviewed risks. Pt voices understanding  If these are ineffective can start prednisone taper.  Patient encouraged to call clinic with any questions, comments, or concerns.  I discussed the assessment and treatment plan with the patient. The patient was provided an opportunity to ask questions and all were answered. The patient agreed with the plan and demonstrated an understanding of the instructions.   The patient was advised to call back or seek an in-person evaluation if the  symptoms worsen or if the condition fails to improve as anticipated.  I provided 13 minutes of non-face-to-face time during this encounter.  Janeece Agee, NP  [facility]

## 2021-02-06 ENCOUNTER — Encounter: Payer: Self-pay | Admitting: *Deleted

## 2021-02-23 ENCOUNTER — Other Ambulatory Visit: Payer: Self-pay | Admitting: Family Medicine

## 2021-06-04 ENCOUNTER — Other Ambulatory Visit: Payer: Self-pay | Admitting: Family Medicine

## 2021-06-07 ENCOUNTER — Other Ambulatory Visit: Payer: Self-pay | Admitting: Family Medicine

## 2021-06-07 NOTE — Telephone Encounter (Signed)
     Patient is requesting a refill of the following medications: Requested Prescriptions   Pending Prescriptions Disp Refills   buPROPion (WELLBUTRIN XL) 300 MG 24 hr tablet [Pharmacy Med Name: BUPROPION HCL ER (XL) 300 MG TAB] 90 tablet 0    Sig: TAKE 1 TABLET BY MOUTH DAILY    Date of patient request: 06/07/2021 Last office visit: 07/17/2020 Date of last refill: 02/26/2021 Last refill amount: 90 tablets  Follow up time period per chart: 08/14/2021

## 2021-07-11 ENCOUNTER — Other Ambulatory Visit: Payer: Self-pay | Admitting: Family Medicine

## 2021-08-06 ENCOUNTER — Encounter: Payer: Self-pay | Admitting: Family Medicine

## 2021-08-06 ENCOUNTER — Ambulatory Visit: Payer: 59 | Admitting: Family Medicine

## 2021-08-06 VITALS — BP 114/82 | HR 87 | Temp 97.8°F | Resp 16 | Wt 129.8 lb

## 2021-08-06 DIAGNOSIS — M5431 Sciatica, right side: Secondary | ICD-10-CM | POA: Diagnosis not present

## 2021-08-06 MED ORDER — CYCLOBENZAPRINE HCL 10 MG PO TABS: 10.0000 mg | ORAL_TABLET | Freq: Three times a day (TID) | ORAL | 0 refills | Status: DC | PRN

## 2021-08-06 MED ORDER — PREDNISONE 10 MG PO TABS: ORAL_TABLET | ORAL | 0 refills | Status: DC

## 2021-08-06 NOTE — Progress Notes (Signed)
° °  Subjective:    Patient ID: Martha Moss, female    DOB: May 25, 1966, 55 y.o.   MRN: [id number]  HPI Leg pain- pt reports R hip and low back have been hurting since 'before Thanksgiving'.  Pt doesn't recall doing any heavy lifting or change in physical activity.  Has hx of R hip problems dating back 'years'.  Pain will radiate down R leg to foot and she will note intermittent numbness.  No improvement w/ Tylenol or Ibuprofen.  Took some of daughter's muscle relaxers w/ some improvement.  Difficult time sleeping, getting out of bed/chair/car.  Painful to both sit and stand.  'constant pain'.  Denies bowel or bladder incontinence.  No fevers.   Review of Systems For ROS see HPI   This visit occurred during the SARS-CoV-2 public health emergency.  Safety protocols were in place, including screening questions prior to the visit, additional usage of staff PPE, and extensive cleaning of exam room while observing appropriate contact time as indicated for disinfecting solutions.      Objective:   Physical Exam Vitals reviewed.  Constitutional:      General: She is not in acute distress.    Appearance: Normal appearance. She is not ill-appearing.  HENT:     Head: Normocephalic and atraumatic.  Eyes:     Extraocular Movements: Extraocular movements intact.     Conjunctiva/sclera: Conjunctivae normal.     Pupils: Pupils are equal, round, and reactive to light.  Cardiovascular:     Rate and Rhythm: Normal rate.     Pulses: Normal pulses.  Pulmonary:     Effort: Pulmonary effort is normal.  Skin:    General: Skin is warm and dry.  Neurological:     Mental Status: She is alert and oriented to person, place, and time.     Cranial Nerves: No cranial nerve deficit.     Gait: Gait abnormal (antalgic gait).     Deep Tendon Reflexes: Reflexes normal.     Comments: + SLR on R, (-) on L  Psychiatric:        Mood and Affect: Mood normal.        Behavior: Behavior normal.        Thought  Content: Thought content normal.          Assessment & Plan:  Sciatica- new.  Pt's sxs and PE consistent w/ dx.  Start Prednisone to improve inflammation and Flexeril to improve spasm.  Continue to heat.  Encouraged gentle stretching.  If no improvement w/ medication, will need PT referral.  Pt expressed understanding and is in agreement w/ plan.

## 2021-08-06 NOTE — Patient Instructions (Signed)
Follow up as scheduled for your physical START the Prednisone as directed- take w/ food USE the Flexeril 3x/day until Friday when you need to work.  Then use it before bed HEAT!!! Change positions slowly and gently Call with any questions or concerns Hang in there! Happy New Year!!!

## 2021-08-09 ENCOUNTER — Telehealth: Payer: Self-pay

## 2021-08-09 MED ORDER — METHOCARBAMOL 500 MG PO TABS: 500.0000 mg | ORAL_TABLET | Freq: Three times a day (TID) | ORAL | 0 refills | Status: DC | PRN

## 2021-08-09 NOTE — Telephone Encounter (Signed)
Placed call to pt to notify of new rx being sent to pharmacy . LMTCB

## 2021-08-09 NOTE — Telephone Encounter (Signed)
Caller name:Deeandra Loose  On DPR? :Yes  Call back number:[phone]  Provider they see: Beverely Low   Reason for call:Pt came to see Dr Beverely Low on Tuesday and she was prescribed cyclobenzaprine (FLEXERIL) 10 MG tablet for sciatica and pt said she has slept with this medication and it has helped a great deal but she is wanting to know if a medication can be prescribed a medication that does not make her sleepy and she takes this one at night?

## 2021-08-09 NOTE — Telephone Encounter (Signed)
Prescription for Methocarbamol sent to pharmacy.  It's a less sedating muscle relaxer

## 2021-08-09 NOTE — Telephone Encounter (Signed)
Pt is asking for non drowsy alternative to flexeril that she can take during daytime as this makes her extremely sleepy

## 2021-08-14 ENCOUNTER — Encounter: Payer: 59 | Admitting: Family Medicine

## 2021-08-14 ENCOUNTER — Encounter: Payer: Self-pay | Admitting: Family Medicine

## 2021-09-30 ENCOUNTER — Telehealth: Payer: Self-pay | Admitting: Gastroenterology

## 2021-09-30 NOTE — Telephone Encounter (Signed)
Spoke with pt. Pt states a couple days ago she had bright red blood in her stools for about half of the day then blood stopped. Pt reports that yesterday she had diarrhea and black stools. Pt states that she is unsure about how many bms she is having a day. Pt also reports that she is often incontinent of stool. Pt stated that today her stool ws more formed but that this is very different from her usual bowel habits because she is normally constipated. Pt denied any lightheadedness, dizziness, or shortness of breath. Pt has had abd pain in her upper and lower stomach 5/10 but pt states that pain is mostly in upper abd. Pt scheduled with Dr. Bryan Lemma on 10/01/21 at 8:20 am. Pt verbalized understanding and had no other concerns at end of call.

## 2021-09-30 NOTE — Telephone Encounter (Signed)
Patient called this morning reporting that she was having bleeding, black stools, and abdominal pain.  She is scheduled for 10/11/21 at 10:40 a.m., but would like to be seen sooner rather than later or told what she should do in the interim.  Please call patient and advise.  Thank you.

## 2021-10-01 ENCOUNTER — Other Ambulatory Visit: Payer: Self-pay

## 2021-10-01 ENCOUNTER — Ambulatory Visit (INDEPENDENT_AMBULATORY_CARE_PROVIDER_SITE_OTHER): Payer: 59 | Admitting: Gastroenterology

## 2021-10-01 ENCOUNTER — Other Ambulatory Visit (INDEPENDENT_AMBULATORY_CARE_PROVIDER_SITE_OTHER): Payer: 59

## 2021-10-01 ENCOUNTER — Encounter: Payer: Self-pay | Admitting: Gastroenterology

## 2021-10-01 VITALS — BP 118/78 | HR 85 | Ht 62.0 in | Wt 131.4 lb

## 2021-10-01 DIAGNOSIS — Z1211 Encounter for screening for malignant neoplasm of colon: Secondary | ICD-10-CM

## 2021-10-01 DIAGNOSIS — K921 Melena: Secondary | ICD-10-CM

## 2021-10-01 DIAGNOSIS — R1084 Generalized abdominal pain: Secondary | ICD-10-CM

## 2021-10-01 DIAGNOSIS — K219 Gastro-esophageal reflux disease without esophagitis: Secondary | ICD-10-CM

## 2021-10-01 DIAGNOSIS — R197 Diarrhea, unspecified: Secondary | ICD-10-CM

## 2021-10-01 DIAGNOSIS — R194 Change in bowel habit: Secondary | ICD-10-CM

## 2021-10-01 LAB — BASIC METABOLIC PANEL
BUN: 27 mg/dL — ABNORMAL HIGH (ref 6–23)
CO2: 27 mEq/L (ref 19–32)
Calcium: 9.9 mg/dL (ref 8.4–10.5)
Chloride: 108 mEq/L (ref 96–112)
Creatinine, Ser: 0.89 mg/dL (ref 0.40–1.20)
GFR: 72.75 mL/min (ref >60.00)
Glucose, Bld: 82 mg/dL (ref 70–99)
Potassium: 3.9 mEq/L (ref 3.5–5.1)
Sodium: 143 mEq/L (ref 135–145)

## 2021-10-01 LAB — CBC
HCT: 39.8 % (ref 36.0–46.0)
Hemoglobin: 13 g/dL (ref 12.0–15.0)
MCHC: 32.6 g/dL (ref 30.0–36.0)
MCV: 93.9 fl (ref 78.0–100.0)
Platelets: 255 10*3/uL (ref 150.0–400.0)
RBC: 4.24 Mil/uL (ref 3.87–5.11)
RDW: 12.9 % (ref 11.5–15.5)
WBC: 5.4 10*3/uL (ref 4.0–10.5)

## 2021-10-01 NOTE — Patient Instructions (Signed)
If you are age 57 or older, your body mass index should be between 23-30. Your Body mass index is 24.03 kg/m. If this is out of the aforementioned range listed, please consider follow up with your Primary Care Provider.  If you are age 39 or younger, your body mass index should be between 19-25. Your Body mass index is 24.03 kg/m. If this is out of the aformentioned range listed, please consider follow up with your Primary Care Provider.   ________________________________________________________  The [facility] providers would like to encourage you to use [facility] to communicate with providers for non-urgent requests or questions.  Due to long hold times on the telephone, sending your provider a message by [facility] may be a faster and more efficient way to get a response.  Please allow 48 business hours for a response.  Please remember that this is for non-urgent requests.  _______________________________________________________  Please go to the lab on the 2nd floor [address] before you leave the office today.   You have been scheduled for a colonoscopy. Please follow written instructions given to you at your visit today.  Please pick up your prep supplies at the pharmacy within the next 1-3 days. If you use inhalers (even only as needed), please bring them with you on the day of your procedure.  We have given you samples of the following medication to take: Clenpiq  It was a pleasure to see you today!  Vito Cirigliano, D.O.

## 2021-10-01 NOTE — Progress Notes (Signed)
Chief Complaint:    Abdominal pain, hematochezia, loose stools  GI History: 56 year old female with a history of migraines, depression, fibromyalgia, interstitial cystitis, ccy, partial hysterectomy, benign liver cysts, admitted on 07/03/2020 with possible new onset seizures.  GI service was consulted due to RUQ pain times months and dysphagia. -CT A/P: Stable 3.8 x 2.8 cm cystic areas in the posterior liver dome with adjacent stable subcentimeter liver cyst. Similar to 06/2018. -EGD (07/09/2020, Dr. Barron Alvine): Normal Z-line, normal esophagus with empiric 54 French Maloney dilation with mucosal rent at 18 cm c/w successful proximal dilation.  Esophageal biopsies with mild reflux changes in distal esophagus; normal proximal.  Non-H. pylori gastritis.  Started pantoprazole 40 mg/day, famotidine 20 mg every morning, Carafate 1 g qid. -Worsening abdominal pain with Carafate so this was discontinued - 07/31/2020: Follow-up in GI clinic.  Continued mild RUQ pain, sometimes worse after eating.   - 10/09/2020: Follow-up in the GI clinic.  Feeling much better, reflux well controlled with Protonix and Pepcid qhs.  Dysphagia resolved with previous Maloney dilation.  Has since followed with neurology at [facility].  Diagnosed with nonepileptic spells.  Last colonoscopy was 2004.  Due for repeat colonoscopy for ongoing CRC screening.  HPI:     Patient is a 56 y.o. female presenting to the Gastroenterology Clinic for evaluation of abdominal pain and hematochezia.  Reports acute onset BRBPR a 1 week ago, lasting 1 day.  Described as BRB on tissue paper only.  Subsequently returned to usual bowel habits for few days then onset of diarrhea, urgency, and black stools 2 days ago.  Does have associated upper abdominal pain. No Pepto Bismol, etc. Did not trial Imodium, Lomotil, etc. Some nausea, but no emesis.   Stools were improving yesterday.  Today, stools are soft, but no longer with black stools. Still generalized  abdominal pain, but much better than 2 days ago.   Baseline tends to be constipation.   "I always have abdominal pain" and uses heating pad regularly.   Reflux well controlled with Protonix 40 mg/day and Pepcid qhs.   Last set of labs for review was 10/2020.  Normal CMP, CBC  Review of systems:     No chest pain, no SOB, no fevers, no urinary sx   Past Medical History:  Diagnosis Date   Depression    Fibromyalgia    GERD (gastroesophageal reflux disease)    Migraine    PONV (postoperative nausea and vomiting)     Patient's surgical history, family medical history, social history, medications and allergies were all reviewed in Epic    Current Outpatient Medications  Medication Sig Dispense Refill   cyclobenzaprine (FLEXERIL) 10 MG tablet Take 1 tablet (10 mg total) by mouth 3 (three) times daily as needed for muscle spasms. 30 tablet 0   Famotidine (PEPCID PO) Take by mouth at bedtime. Taking once daily, patient does not recall the dose     fexofenadine (ALLEGRA) 180 MG tablet Take 180 mg by mouth as directed. Monday Wednesday and Friday     levocetirizine (XYZAL) 5 MG tablet Take 5 mg by mouth as directed. Tuesday Thursday Saturday and Sunday     magnesium 30 MG tablet Take 30 mg by mouth every other day.     methocarbamol (ROBAXIN) 500 MG tablet Take 1 tablet (500 mg total) by mouth every 8 (eight) hours as needed for muscle spasms. 30 tablet 0   Multiple Vitamin (MULTIVITAMIN WITH MINERALS) TABS tablet Take 1 tablet by mouth daily.  pantoprazole (PROTONIX) 40 MG tablet Take 1 tablet (40 mg total) by mouth 2 (two) times daily for 30 days, THEN 1 tablet (40 mg total) daily. 90 tablet 3   vitamin E 1000 UNIT capsule Take 1,000 Units by mouth daily.     No current facility-administered medications for this visit.    Physical Exam:     BP 118/78    Pulse 85    Ht 5\' 2"  (1.575 m)    Wt 131 lb 6 oz (59.6 kg)    SpO2 99%    BMI 24.03 kg/m   GENERAL:  Pleasant female in  NAD PSYCH: : Cooperative, normal affect EENT:  conjunctiva pink, mucous membranes moist, neck supple without masses CARDIAC:  RRR, no murmur heard, no peripheral edema PULM: Normal respiratory effort, lungs CTA bilaterally, no wheezing ABDOMEN: Mild TTP in upper abdomen without rebound or guarding.  No peritoneal signs.  Nondistended, soft. No obvious masses, no hepatomegaly,  normal bowel sounds SKIN:  turgor, no lesions seen Musculoskeletal:  Normal muscle tone, normal strength NEURO: Alert and oriented x 3, no focal neurologic deficits Rectal: Exam deferred to time of colonoscopy.    IMPRESSION and PLAN:    1) Hematochezia - Describes a history of symptomatic hemorrhoids, with rectal itching, irritation, and most recently BRB on tissue paper lasting <1 day.  Discussed DDx to include most likely benign anorectal bleeding such as hemorrhoids.  Plan to evaluate at time of colonoscopy as below. - If clinically significant hemorrhoids at time of colonoscopy, discussed hemorrhoid band ligation  2) Diarrhea 3) Generalized abdominal pain 4) Dark stools - Symptoms all improving now.  Possibly infectious etiology, but given self limiting nature and stools back to baseline, holding off on infectious panel, etc. - Check CBC, BMP - Can evaluate for mucosal/luminal pathology at time of colonoscopy as below - If needed, could use Bentyl for abdominal pain/cramping in the future for her more chronic generalized abdominal pain sxs  5) Colon cancer screening - Due for ongoing colon cancer screening - Schedule colonoscopy  6) GERD - Well-controlled on current therapy - Continue antireflux lifestyle/dietary modifications  The indications, risks, and benefits of colonoscopy were explained to the patient in detail. Risks include but are not limited to bleeding, perforation, adverse reaction to medications, and cardiopulmonary compromise. Sequelae include but are not limited to the possibility of  surgery, hospitalization, and mortality. The patient verbalized understanding and wished to proceed. All questions answered, referred to the scheduler and bowel prep ordered. Further recommendations pending results of the exam.        ,DO, FACG 10/01/2021, 8:23 AM

## 2021-10-03 ENCOUNTER — Telehealth: Payer: Self-pay | Admitting: General Surgery

## 2021-10-03 DIAGNOSIS — R799 Abnormal finding of blood chemistry, unspecified: Secondary | ICD-10-CM

## 2021-10-03 NOTE — Telephone Encounter (Signed)
-----   Message from [facility] V, DO sent at 10/02/2021  4:16 PM EST ----- BUN slightly elevated at 27, otherwise basic metabolic panel is normal, with normal kidney function and electrolytes.  CBC normal without any anemia.  If dark stools recur, will consider upper endoscopy to ensure gastric ulcer, gastritis, etc.  Repeat BMP in 7 days to ensure BUN downtrending back to baseline.

## 2021-10-03 NOTE — Telephone Encounter (Signed)
Contacted the patient go over labs, LVM for her to contact the office.

## 2021-10-03 NOTE — Telephone Encounter (Signed)
Spoke with the patient and she will come to [place] on 10/11/2021 for her BMP. The patient verbalized understanding of labs. She also questioned if she could increase her protonix to bid, I expressed I would speak with Dr Barron Alvine and call her tomorrow.

## 2021-10-03 NOTE — Telephone Encounter (Signed)
Inbound call from pt returning your phone call and had a question about her protonix. Thank you.

## 2021-10-11 ENCOUNTER — Ambulatory Visit: Payer: 59 | Admitting: Gastroenterology

## 2021-10-11 ENCOUNTER — Other Ambulatory Visit (INDEPENDENT_AMBULATORY_CARE_PROVIDER_SITE_OTHER): Payer: 59

## 2021-10-11 DIAGNOSIS — R799 Abnormal finding of blood chemistry, unspecified: Secondary | ICD-10-CM

## 2021-10-11 LAB — BASIC METABOLIC PANEL
BUN: 26 mg/dL — ABNORMAL HIGH (ref 6–23)
CO2: 24 mEq/L (ref 19–32)
Calcium: 9.5 mg/dL (ref 8.4–10.5)
Chloride: 108 mEq/L (ref 96–112)
Creatinine, Ser: 0.8 mg/dL (ref 0.40–1.20)
GFR: 82.66 mL/min (ref >60.00)
Glucose, Bld: 93 mg/dL (ref 70–99)
Potassium: 3.6 mEq/L (ref 3.5–5.1)
Sodium: 140 mEq/L (ref 135–145)

## 2021-10-15 ENCOUNTER — Telehealth: Payer: Self-pay | Admitting: Gastroenterology

## 2021-10-15 NOTE — Telephone Encounter (Signed)
Left message for pt to call back  °

## 2021-10-16 NOTE — Telephone Encounter (Signed)
Called pt and pt's husband picked up. Pt's husband said he would have pt call us back.  ?

## 2021-10-16 NOTE — Telephone Encounter (Signed)
Spoke with pt and pt stated that she saw the lab results online and had no further questions.  ?

## 2021-10-25 ENCOUNTER — Ambulatory Visit
Admission: EM | Admit: 2021-10-25 | Discharge: 2021-10-25 | Disposition: A | Discharge status: Home / Self Care | Payer: 59 | Attending: Physician Assistant | Admitting: Physician Assistant

## 2021-10-25 ENCOUNTER — Other Ambulatory Visit: Payer: Self-pay

## 2021-10-25 DIAGNOSIS — J069 Acute upper respiratory infection, unspecified: Secondary | ICD-10-CM | POA: Diagnosis present

## 2021-10-25 LAB — POCT URINALYSIS DIP (MANUAL ENTRY)
Glucose, UA: 100 mg/dL — AB
Nitrite, UA: POSITIVE — AB
Protein Ur, POC: 30 mg/dL — AB
Spec Grav, UA: >=1.03 — AB (ref 1.010–1.025)
Urobilinogen, UA: 1 E.U./dL
pH, UA: 5.5 (ref 5.0–8.0)

## 2021-10-25 MED ORDER — CIPROFLOXACIN HCL 500 MG PO TABS: 500.0000 mg | ORAL_TABLET | Freq: Two times a day (BID) | ORAL | 0 refills | Status: DC

## 2021-10-25 NOTE — ED Provider Notes (Signed)
?[facility] ? ? ? ?CSN: [id number] ?Arrival date & time: 10/25/21  1726 ? ? ?  ? ?History   ?Chief Complaint ?Chief Complaint  ?Patient presents with  ? Dysuria  ? Flank Pain  ? Urinary Urgency  ? ? ?HPI ?Martha Moss is a 56 y.o. female.  ? ?Patient here today for evaluation of dysuria, flank pain and urinary urgency that started 3 days ago. She states she recently had UTI and was treated with macrobid but this has not seemed effective. She has been using azo with minimal relief. She has not had fever. She reports some back pain but is not sure if this is related to UTI or sciatica.  ? ?The history is provided by the patient.  ? ?Past Medical History:  ?Diagnosis Date  ? Depression   ? Fibromyalgia   ? GERD (gastroesophageal reflux disease)   ? Migraine   ? PONV (postoperative nausea and vomiting)   ? ? ?Patient Active Problem List  ? Diagnosis Date Noted  ? Esophageal dysphagia   ? Right upper quadrant abdominal pain   ? Nausea without vomiting   ? Seizure-like activity (HCC) 07/03/2020  ? Psychogenic nonepileptic seizure 07/03/2020  ? Seizure (HCC) 07/03/2020  ? Hypotension 07/03/2020  ? Altered mental status   ? Neuropathy of both feet 12/22/2017  ? Physical exam 09/25/2016  ? Chronic idiopathic constipation 10/13/2014  ? Insomnia 06/22/2013  ? Pain in right wrist 06/22/2013  ? Soft tissue mass 06/22/2013  ? Plantar fasciitis of left foot 03/14/2013  ? Shaky 02/22/2013  ? Depression with anxiety 10/28/2010  ? Migraine without aura 10/28/2010  ? GERD 10/28/2010  ? CHEST PAIN, ATYPICAL, HX OF 03/27/2010  ? ALLERGIC RHINITIS, SEASONAL 12/07/2009  ? Fibromyalgia 10/26/2009  ? POLYARTHRITIS 10/19/2009  ? ? ?Past Surgical History:  ?Procedure Laterality Date  ? BIOPSY  07/06/2020  ? Procedure: BIOPSY;  Surgeon: Shellia Cleverlyirigliano, Vito V, DO;  Location: [facility];  Service: Gastroenterology;;  ? CHOLECYSTECTOMY    ? ESOPHAGOGASTRODUODENOSCOPY Left 07/06/2020  ? Procedure: ESOPHAGOGASTRODUODENOSCOPY  (EGD);  Surgeon: Shellia Cleverlyirigliano, Vito V, DO;  [facility];  Service: Gastroenterology;  Laterality: Left;  ? ETHMOIDECTOMY Bilateral 09/09/2016  ? Procedure: ETHMOIDECTOMY;  Surgeon: Newman PiesSu Teoh, MD;  Location: [facility];  Service: ENT;  Laterality: Bilateral;  ? FRONTAL SINUS EXPLORATION Bilateral 09/09/2016  ? Procedure: FRONTAL RECESS SINUS EXPLORATION;  Surgeon: Newman PiesSu Teoh, MD;  Location: [facility];  Service: ENT;  Laterality: Bilateral;  ? MALONEY DILATION  07/06/2020  ? Procedure: MALONEY DILATION;  Surgeon: Shellia Cleverlyirigliano, Vito V, DO;  Location: [facility];  Service: Gastroenterology;;  ? MAXILLARY ANTROSTOMY Bilateral 09/09/2016  ? Procedure: MAXILLARY ANTROSTOMY WITH TISSUE REMOVAL;  Surgeon: Newman PiesSu Teoh, MD;  Location: [facility];  Service: ENT;  Laterality: Bilateral;  ? OVARIAN CYST SURGERY    ? SINUS ENDO W/FUSION Bilateral 09/09/2016  ? Procedure: ENDOSCOPIC SINUS SURGERY WITH NAVIGATION;  Surgeon: Newman PiesSu Teoh, MD;  Location: [facility];  Service: ENT;  Laterality: Bilateral;  ? TOTAL VAGINAL HYSTERECTOMY    ? TURBINATE REDUCTION Bilateral 09/09/2016  ? Procedure: BILATERAL TURBINATE REDUCTION;  Surgeon: Newman PiesSu Teoh, MD;  Location: [facility];  Service: ENT;  Laterality: Bilateral;  ? ? ?OB History   ? ? Gravida  ?3  ? Para  ?3  ? Term  ?3  ? Preterm  ?   ? AB  ?   ? Living  ?3  ?  ? ?  SAB  ?   ? IAB  ?   ? Ectopic  ?   ? Multiple  ?   ? Live Births  ?3  ?   ?  ?  ? ? ? ?Home Medications   ? ?Prior to Admission medications   ?Medication Sig Start Date End Date Taking? Authorizing Provider  ?ciprofloxacin (CIPRO) 500 MG tablet Take 1 tablet (500 mg total) by mouth every 12 (twelve) hours. 10/25/21  Yes Tomi Bamberger, PA-C  ?cyclobenzaprine (FLEXERIL) 10 MG tablet Take 1 tablet (10 mg total) by mouth 3 (three) times daily as needed for muscle spasms. 08/06/21   Sheliah Hatch, MD  ?Famotidine (PEPCID PO) Take by mouth at bedtime. Taking  once daily, patient does not recall the dose    [provider]  ?fexofenadine (ALLEGRA) 180 MG tablet Take 180 mg by mouth as directed. Monday Wednesday and Friday    [provider]  ?levocetirizine (XYZAL) 5 MG tablet Take 5 mg by mouth as directed. Tuesday Thursday Saturday and Sunday    [provider]  ?magnesium 30 MG tablet Take 30 mg by mouth every other day.    [provider]  ?methocarbamol (ROBAXIN) 500 MG tablet Take 1 tablet (500 mg total) by mouth every 8 (eight) hours as needed for muscle spasms. 08/09/21   Sheliah Hatch, MD  ?Multiple Vitamin (MULTIVITAMIN WITH MINERALS) TABS tablet Take 1 tablet by mouth daily.    [provider]  ?pantoprazole (PROTONIX) 40 MG tablet Take 1 tablet (40 mg total) by mouth 2 (two) times daily for 30 days, THEN 1 tablet (40 mg total) daily. 10/09/20 12/08/20  Cirigliano, Verlin Dike, DO  ?Topiramate ER (TROKENDI XR) 100 MG CP24 Take 1 capsule by mouth daily.    [provider]  ?vitamin E 1000 UNIT capsule Take 1,000 Units by mouth daily.    [provider]  ? ? ?Family History ?Family History  ?Problem Relation Age of Onset  ? Kidney Stones Daughter   ? Migraines Mother   ? Heart attack Father 38  ? Diabetes Maternal Grandmother   ? Stroke Sister   ? Migraines Brother   ? Stroke Brother   ? Heart disease Brother   ? Migraines Other   ?     many family members on maternal side   ? Colon cancer Neg Hx   ? Pancreatic cancer Neg Hx   ? Esophageal cancer Neg Hx   ? ? ?Social History ?Social History  ? ?Tobacco Use  ? Smoking status: Never  ? Smokeless tobacco: Never  ?Vaping Use  ? Vaping Use: Never used  ?Substance Use Topics  ? Alcohol use: Yes  ?  Comment: rarely  ? Drug use: No  ? ? ? ?Allergies   ?Bee venom, Azithromycin, Diphenhydramine hcl, Erythromycin, Latex, Morphine and related, Penicillins, Percocet [oxycodone-acetaminophen], Sulfonamide derivatives, and Tetracyclines & related ? ? ?Review of  Systems ?Review of Systems  ?Constitutional:  Negative for chills and fever.  ?Respiratory:  Negative for shortness of breath.   ?Gastrointestinal:  Negative for abdominal pain, nausea and vomiting.  ?Genitourinary:  Positive for dysuria, frequency and urgency.  ?Musculoskeletal:  Positive for back pain.  ? ? ?Physical Exam ?Triage Vital Signs ?ED Triage Vitals  ?Enc Vitals Group  ?   BP   ?   Pulse   ?   Resp   ?   Temp   ?   Temp src   ?  SpO2   ?   Weight   ?   Height   ?   Head Circumference   ?   Peak Flow   ?   Pain Score   ?   Pain Loc   ?   Pain Edu?   ?   Excl. in GC?   ? ?No data found. ? ?Updated Vital Signs ?BP 100/68 (BP Location: Left Arm)   Pulse 87   Temp 98.6 ?F (37 ?C) (Oral)   Resp 16   SpO2 93%  ?   ? ?Physical Exam ?Vitals and nursing note reviewed.  ?Constitutional:   ?   General: She is not in acute distress. ?   Appearance: Normal appearance. She is not ill-appearing.  ?HENT:  ?   Head: Normocephalic and atraumatic.  ?   Nose: Nose normal.  ?Cardiovascular:  ?   Rate and Rhythm: Normal rate.  ?Pulmonary:  ?   Effort: Pulmonary effort is normal. No respiratory distress.  ?Skin: ?   General: Skin is warm and dry.  ?Neurological:  ?   Mental Status: She is alert.  ?Psychiatric:     ?   Mood and Affect: Mood normal.     ?   Thought Content: Thought content normal.  ? ? ? ?UC Treatments / Results  ?Labs ?(all labs ordered are listed, but only abnormal results are displayed) ?Labs Reviewed  ?POCT URINALYSIS DIP (MANUAL ENTRY) - Abnormal; Notable for the following components:  ?    Result Value  ? Color, UA orange (*)   ? Glucose, UA =100 (*)   ? Bilirubin, UA small (*)   ? Ketones, POC UA trace (5) (*)   ? Spec Grav, UA >=1.030 (*)   ? Blood, UA trace-intact (*)   ? Protein Ur, POC =30 (*)   ? Nitrite, UA Positive (*)   ? Leukocytes, UA Trace (*)   ? All other components within normal limits  ?URINE CULTURE  ? ? ?EKG ? ? ?Radiology ?No results found. ? ?Procedures ?Procedures (including  critical care time) ? ?Medications Ordered in UC ?Medications - No data to display ? ?Initial Impression / Assessment and Plan / UC Course  ?I have reviewed the triage vital signs and the nursing notes. ? ?Pertinent labs &

## 2021-10-25 NOTE — ED Triage Notes (Signed)
Patient presents to Urgent Care with complaints of dysuria, flank pain, and urinary urgency x 3 days ago. She states she had a UTI and treated with macrobid. She has a hx of UTI. Treating with azo cystex.  ? ?Denies fever or hematuria.  ?

## 2021-10-28 LAB — URINE CULTURE: Culture: 50000 — AB

## 2021-10-30 ENCOUNTER — Ambulatory Visit (AMBULATORY_SURGERY_CENTER): Payer: 59 | Admitting: Gastroenterology

## 2021-10-30 ENCOUNTER — Encounter: Payer: Self-pay | Admitting: Gastroenterology

## 2021-10-30 ENCOUNTER — Other Ambulatory Visit: Payer: Self-pay

## 2021-10-30 VITALS — BP 114/52 | HR 74 | Temp 97.3°F | Resp 12 | Ht 62.0 in | Wt 131.0 lb

## 2021-10-30 DIAGNOSIS — R194 Change in bowel habit: Secondary | ICD-10-CM | POA: Diagnosis not present

## 2021-10-30 DIAGNOSIS — R197 Diarrhea, unspecified: Secondary | ICD-10-CM | POA: Diagnosis present

## 2021-10-30 DIAGNOSIS — K641 Second degree hemorrhoids: Secondary | ICD-10-CM

## 2021-10-30 DIAGNOSIS — Z1211 Encounter for screening for malignant neoplasm of colon: Secondary | ICD-10-CM

## 2021-10-30 MED ORDER — SODIUM CHLORIDE 0.9 % IV SOLN: 500.0000 mL | Freq: Once | INTRAVENOUS | Status: DC

## 2021-10-30 NOTE — Progress Notes (Signed)
PT taken to PACU. Monitors in place. VSS. Report given to RN. 

## 2021-10-30 NOTE — Progress Notes (Signed)
Pt's states no medical or surgical changes since previsit or office visit. 

## 2021-10-30 NOTE — Op Note (Signed)
[facility] ?Patient Name: Martha Moss ?Procedure Date: 10/30/2021 7:02 AM ?MRN: [id number] ?Endoscopist: Doristine Locks , MD ?Age: 56 ?Referring MD:  ?Date of Birth: 11-10-1965 ?Gender: Female ?Account #: [id number] ?Procedure:                Colonoscopy ?Indications:              Screening for colorectal malignant neoplasm ?                          Incidentally also with recent loose stools and  ?                          change in bowel habits along with symptomatic  ?                          hemorrhoids. ?Medicines:                Monitored Anesthesia Care ?Procedure:                Pre-Anesthesia Assessment: ?                          - Prior to the procedure, a History and Physical  ?                          was performed, and patient medications and  ?                          allergies were reviewed. The patient's tolerance of  ?                          previous anesthesia was also reviewed. The risks  ?                          and benefits of the procedure and the sedation  ?                          options and risks were discussed with the patient.  ?                          All questions were answered, and informed consent  ?                          was obtained. Prior Anticoagulants: The patient has  ?                          taken no previous anticoagulant or antiplatelet  ?                          agents. ASA Grade Assessment: II - A patient with  ?                          mild systemic disease. After reviewing the risks  ?  and benefits, the patient was deemed in  ?                          satisfactory condition to undergo the procedure. ?                          After obtaining informed consent, the colonoscope  ?                          was passed under direct vision. Throughout the  ?                          procedure, the patient's blood pressure, pulse, and  ?                          oxygen saturations were monitored continuously. The  ?                           Olympus scope [id number] was introduced through the  ?                          anus and advanced to the the terminal ileum. The  ?                          colonoscopy was performed without difficulty. The  ?                          patient tolerated the procedure well. The quality  ?                          of the bowel preparation was good. The terminal  ?                          ileum, ileocecal valve, appendiceal orifice, and  ?                          rectum were photographed. ?Scope In: 8:07:25 AM ?Scope Out: 8:22:59 AM ?Scope Withdrawal Time: 0 hours 12 minutes 42 seconds  ?Total Procedure Duration: 0 hours 15 minutes 34 seconds  ?Findings:                 Hemorrhoids were found on perianal exam. ?                          The entire colon appeared normal. Biopsies for  ?                          histology were taken with a cold forceps from the  ?                          right colon and left colon for evaluation of  ?                          microscopic colitis. Estimated blood loss was  ?  minimal. ?                          Non-bleeding internal hemorrhoids were found during  ?                          retroflexion. The hemorrhoids were medium-sized and  ?                          Grade II (internal hemorrhoids that prolapse but  ?                          reduce spontaneously). ?                          The terminal ileum appeared normal. ?Complications:            No immediate complications. ?Estimated Blood Loss:     Estimated blood loss was minimal. ?Impression:               - Hemorrhoids found on perianal exam. ?                          - The entire examined colon is normal. Biopsied. ?                          - Non-bleeding internal hemorrhoids. ?                          - The examined portion of the ileum was normal. ?Recommendation:           - Patient has a contact number available for  ?                          emergencies. The signs and  symptoms of potential  ?                          delayed complications were discussed with the  ?                          patient. Return to normal activities tomorrow.  ?                          Written discharge instructions were provided to the  ?                          patient. ?                          - Resume previous diet. ?                          - Continue present medications. ?                          - Await pathology results. ?                          -  Repeat colonoscopy in 10 years for screening  ?                          purposes. ?                          - Internal hemorrhoids were noted on this study and  ?                          may be amenable to hemorrhoid band ligation. If you  ?                          are interested in further treatment of these  ?                          hemorrhoids with band ligation, please contact my  ?                          clinic to set up an appointment for evaluation and  ?                          treatment. ?                          - Use fiber, for example Citrucel, Fibercon, Konsyl  ?                          or Metamucil. ?Doristine Locks, MD ?10/30/2021 8:30:02 AM ?

## 2021-10-30 NOTE — Progress Notes (Signed)
D.T. vital signs. °

## 2021-10-30 NOTE — Progress Notes (Signed)
Called to room to assist during endoscopic procedure.  Patient ID and intended procedure confirmed with present staff. Received instructions for my participation in the procedure from the performing physician.  

## 2021-10-30 NOTE — Progress Notes (Signed)
? ?GASTROENTEROLOGY PROCEDURE H&P NOTE  ? ?Primary Care Physician: ?Sheliah Hatch, MD ? ? ? ?Reason for Procedure:  Colon cancer screening, hematochezia, diarrhea, abdominal pain ? ?Plan:    Colonoscopy ? ?Patient is appropriate for endoscopic procedure(s) in the ambulatory ([facility]) setting. ? ?The nature of the procedure, as well as the risks, benefits, and alternatives were carefully and thoroughly reviewed with the patient. Ample time for discussion and questions allowed. The patient understood, was satisfied, and agreed to proceed.  ? ? ? ?HPI: ?Martha Moss is a 56 y.o. female who presents for colonoscopy for initial colon cancer screening along with evaluation of hematochezia, generalized abdominal pain, and diarrhea. ? ?Past Medical History:  ?Diagnosis Date  ? Depression   ? Fibromyalgia   ? GERD (gastroesophageal reflux disease)   ? Migraine   ? PONV (postoperative nausea and vomiting)   ? ? ?Past Surgical History:  ?Procedure Laterality Date  ? BIOPSY  07/06/2020  ? Procedure: BIOPSY;  Surgeon: Shellia Cleverly, DO;  Location: [facility];  Service: Gastroenterology;;  ? CHOLECYSTECTOMY    ? ESOPHAGOGASTRODUODENOSCOPY Left 07/06/2020  ? Procedure: ESOPHAGOGASTRODUODENOSCOPY (EGD);  Surgeon: Shellia Cleverly, DO;  Location: [facility];  Service: Gastroenterology;  Laterality: Left;  ? ETHMOIDECTOMY Bilateral 09/09/2016  ? Procedure: ETHMOIDECTOMY;  Surgeon: Newman Pies, MD;  Location: [facility];  Service: ENT;  Laterality: Bilateral;  ? FRONTAL SINUS EXPLORATION Bilateral 09/09/2016  ? Procedure: FRONTAL RECESS SINUS EXPLORATION;  Surgeon: Newman Pies, MD;  Location: [facility];  Service: ENT;  Laterality: Bilateral;  ? MALONEY DILATION  07/06/2020  ? Procedure: MALONEY DILATION;  Surgeon: Shellia Cleverly, DO;  Location: [facility];  Service: Gastroenterology;;  ? MAXILLARY ANTROSTOMY Bilateral 09/09/2016  ? Procedure: MAXILLARY ANTROSTOMY WITH TISSUE REMOVAL;   Surgeon: Newman Pies, MD;  Location: [facility];  Service: ENT;  Laterality: Bilateral;  ? OVARIAN CYST SURGERY    ? SINUS ENDO W/FUSION Bilateral 09/09/2016  ? Procedure: ENDOSCOPIC SINUS SURGERY WITH NAVIGATION;  Surgeon: Newman Pies, MD;  Location: [facility];  Service: ENT;  Laterality: Bilateral;  ? TOTAL VAGINAL HYSTERECTOMY    ? TURBINATE REDUCTION Bilateral 09/09/2016  ? Procedure: BILATERAL TURBINATE REDUCTION;  Surgeon: Newman Pies, MD;  Location: [facility];  Service: ENT;  Laterality: Bilateral;  ? ? ?Prior to Admission medications   ?Medication Sig Start Date End Date Taking? Authorizing Provider  ?ciprofloxacin (CIPRO) 500 MG tablet Take 1 tablet (500 mg total) by mouth every 12 (twelve) hours. 10/25/21  Yes Tomi Bamberger, PA-C  ?cyclobenzaprine (FLEXERIL) 10 MG tablet Take 1 tablet (10 mg total) by mouth 3 (three) times daily as needed for muscle spasms. 08/06/21  Yes Sheliah Hatch, MD  ?Famotidine (PEPCID PO) Take by mouth at bedtime. Taking once daily, patient does not recall the dose   Yes [provider]  ?fexofenadine (ALLEGRA) 180 MG tablet Take 180 mg by mouth as directed. Monday Wednesday and Friday   Yes [provider]  ?levocetirizine (XYZAL) 5 MG tablet Take 5 mg by mouth as directed. Tuesday Thursday Saturday and Sunday   Yes [provider]  ?magnesium 30 MG tablet Take 30 mg by mouth every other day.   Yes [provider]  ?Multiple Vitamin (MULTIVITAMIN WITH MINERALS) TABS tablet Take 1 tablet by mouth daily.   Yes [provider]  ?Topiramate ER (TROKENDI XR) 100 MG CP24 Take 1 capsule by mouth daily.  Yes [provider]  ?vitamin E 1000 UNIT capsule Take 1,000 Units by mouth daily.   Yes [provider]  ?methocarbamol (ROBAXIN) 500 MG tablet Take 1 tablet (500 mg total) by mouth every 8 (eight) hours as needed for muscle spasms. 08/09/21   Sheliah Hatch, MD  ?pantoprazole  (PROTONIX) 40 MG tablet Take 1 tablet (40 mg total) by mouth 2 (two) times daily for 30 days, THEN 1 tablet (40 mg total) daily. 10/09/20 12/08/20  Vinia Jemmott, Verlin Dike, DO  ? ? ?Current Outpatient Medications  ?Medication Sig Dispense Refill  ? ciprofloxacin (CIPRO) 500 MG tablet Take 1 tablet (500 mg total) by mouth every 12 (twelve) hours. 10 tablet 0  ? cyclobenzaprine (FLEXERIL) 10 MG tablet Take 1 tablet (10 mg total) by mouth 3 (three) times daily as needed for muscle spasms. 30 tablet 0  ? Famotidine (PEPCID PO) Take by mouth at bedtime. Taking once daily, patient does not recall the dose    ? fexofenadine (ALLEGRA) 180 MG tablet Take 180 mg by mouth as directed. Monday Wednesday and Friday    ? levocetirizine (XYZAL) 5 MG tablet Take 5 mg by mouth as directed. Tuesday Thursday Saturday and Sunday    ? magnesium 30 MG tablet Take 30 mg by mouth every other day.    ? Multiple Vitamin (MULTIVITAMIN WITH MINERALS) TABS tablet Take 1 tablet by mouth daily.    ? Topiramate ER (TROKENDI XR) 100 MG CP24 Take 1 capsule by mouth daily.    ? vitamin E 1000 UNIT capsule Take 1,000 Units by mouth daily.    ? methocarbamol (ROBAXIN) 500 MG tablet Take 1 tablet (500 mg total) by mouth every 8 (eight) hours as needed for muscle spasms. 30 tablet 0  ? pantoprazole (PROTONIX) 40 MG tablet Take 1 tablet (40 mg total) by mouth 2 (two) times daily for 30 days, THEN 1 tablet (40 mg total) daily. 90 tablet 3  ? ?Current Facility-Administered Medications  ?Medication Dose Route Frequency Provider Last Rate Last Admin  ? 0.9 %  sodium chloride infusion  500 mL Intravenous Once Calinda Stockinger V, DO      ? ? ?Allergies as of 10/30/2021 - Review Complete 10/30/2021  ?Allergen Reaction Noted  ? Bee venom  03/14/2013  ? Azithromycin  10/19/2009  ? Diphenhydramine hcl  10/19/2009  ? Erythromycin  10/19/2009  ? Latex  10/19/2009  ? Morphine and related Nausea And Vomiting 12/22/2014  ? Penicillins  10/19/2009  ? Percocet  [oxycodone-acetaminophen] Other (See Comments) 09/09/2016  ? Sulfonamide derivatives  10/19/2009  ? Tetracyclines & related  01/16/2012  ? ? ?Family History  ?Problem Relation Age of Onset  ? Kidney Stones Daughter   ? Migraines Mother   ? Heart attack Father 36  ? Diabetes Maternal Grandmother   ? Stroke Sister   ? Migraines Brother   ? Stroke Brother   ? Heart disease Brother   ? Migraines Other   ?     many family members on maternal side   ? Colon cancer Neg Hx   ? Pancreatic cancer Neg Hx   ? Esophageal cancer Neg Hx   ? ? ?Social History  ? ?Socioeconomic History  ? Marital status: Married  ?  Spouse name: Not on file  ? Number of children: 3  ? Years of education: Not on file  ? Highest education level: Not on file  ?Occupational History  ? Occupation: pre Engineer, site  ?Tobacco Use  ?  Smoking status: Never  ? Smokeless tobacco: Never  ?Vaping Use  ? Vaping Use: Never used  ?Substance and Sexual Activity  ? Alcohol use: Yes  ?  Comment: rarely  ? Drug use: No  ? Sexual activity: Yes  ?  Birth control/protection: Surgical  ?Other Topics Concern  ? Not on file  ?Social History Narrative  ? Lives at home with husband   ? Right handed  ? Caffeine: none   ? ?Social Determinants of Health  ? ?Financial Resource Strain: Not on file  ?Food Insecurity: Not on file  ?Transportation Needs: Not on file  ?Physical Activity: Not on file  ?Stress: Not on file  ?Social Connections: Not on file  ?Intimate Partner Violence: Not on file  ? ? ?Physical Exam: ?Vital signs in last 24 hours: ?@BP  132/71   Pulse 93   Temp (!) 97.3 ?F (36.3 ?C)   Ht 5\' 2"  (1.575 m)   Wt 131 lb (59.4 kg)   SpO2 100%   BMI 23.96 kg/m?  ?GEN: NAD ?EYE: Sclerae anicteric ?ENT: MMM ?CV: Non-tachycardic ?Pulm: CTA b/l ?GI: Soft, NT/ND ?NEURO:  Alert & Oriented x 3 ? ? ?Doristine LocksVito Kel Senn, DO ?[facility] ? ? ?10/30/2021 7:44 AM ? ?

## 2021-10-30 NOTE — Patient Instructions (Signed)
Handouts on hemorrhoids and hemorrhoidal banding given to patient. ?Await pathology results of biopsies taken. ?Resume previous diet and continue present medications. ?Recommended to use fiber (examples - Citrucel, Fibercon, Konsyl, or Metamucil) ?Repeat colonoscopy for surveillance in 10 years! ? ?YOU HAD AN ENDOSCOPIC PROCEDURE TODAY AT THE [facility]:   Refer to the procedure report that was given to you for any specific questions about what was found during the examination.  If the procedure report does not answer your questions, please call your gastroenterologist to clarify.  If you requested that your care partner not be given the details of your procedure findings, then the procedure report has been included in a sealed envelope for you to review at your convenience later. ? ?YOU SHOULD EXPECT: Some feelings of bloating in the abdomen. Passage of more gas than usual.  Walking can help get rid of the air that was put into your GI tract during the procedure and reduce the bloating. If you had a lower endoscopy (such as a colonoscopy or flexible sigmoidoscopy) you may notice spotting of blood in your stool or on the toilet paper. If you underwent a bowel prep for your procedure, you may not have a normal bowel movement for a few days. ? ?Please Note:  You might notice some irritation and congestion in your nose or some drainage.  This is from the oxygen used during your procedure.  There is no need for concern and it should clear up in a day or so. ? ?SYMPTOMS TO REPORT IMMEDIATELY: ? ?Following lower endoscopy (colonoscopy or flexible sigmoidoscopy): ? Excessive amounts of blood in the stool ? Significant tenderness or worsening of abdominal pains ? Swelling of the abdomen that is new, acute ? Fever of 100?F or higher ? ?For urgent or emergent issues, a gastroenterologist can be reached at any hour by calling [phone]. ?Do not use MyChart messaging for urgent concerns.  ? ? ?DIET:  We do  recommend a small meal at first, but then you may proceed to your regular diet.  Drink plenty of fluids but you should avoid alcoholic beverages for 24 hours. ? ?ACTIVITY:  You should plan to take it easy for the rest of today and you should NOT DRIVE or use heavy machinery until tomorrow (because of the sedation medicines used during the test).   ? ?FOLLOW UP: ?Our staff will call the number listed on your records 48-72 hours following your procedure to check on you and address any questions or concerns that you may have regarding the information given to you following your procedure. If we do not reach you, we will leave a message.  We will attempt to reach you two times.  During this call, we will ask if you have developed any symptoms of COVID 19. If you develop any symptoms (ie: fever, flu-like symptoms, shortness of breath, cough etc.) before then, please call [phone].  If you test positive for Covid 19 in the 2 weeks post procedure, please call and report this information to [place].   ? ?If any biopsies were taken you will be contacted by phone or by letter within the next 1-3 weeks.  Please call us at [phone] if you have not heard about the biopsies in 3 weeks.  ? ? ?SIGNATURES/CONFIDENTIALITY: ?You and/or your care partner have signed paperwork which will be entered into your electronic medical record.  These signatures attest to the fact that that the information above on your After Visit Summary has  been reviewed and is understood.  Full responsibility of the confidentiality of this discharge information lies with you and/or your care-partner.  ?

## 2021-10-31 ENCOUNTER — Telehealth: Payer: Self-pay | Admitting: General Surgery

## 2021-10-31 NOTE — Telephone Encounter (Signed)
Scheduled the patient for her 1st hemorrhoid banding 11/01/2021 @8 :20 patient was asked to show up 15 minutes early. Patient verbalized understanding ?

## 2021-10-31 NOTE — Telephone Encounter (Signed)
-----   Message from Lavena Bullion, DO sent at 10/30/2021  9:41 AM EDT ----- ?Please schedule OV w/ me for hemorrhoid banding. Thanks.  ? ?

## 2021-11-01 ENCOUNTER — Telehealth: Payer: Self-pay | Admitting: *Deleted

## 2021-11-01 ENCOUNTER — Other Ambulatory Visit: Payer: Self-pay

## 2021-11-01 ENCOUNTER — Encounter: Payer: Self-pay | Admitting: Gastroenterology

## 2021-11-01 ENCOUNTER — Ambulatory Visit (INDEPENDENT_AMBULATORY_CARE_PROVIDER_SITE_OTHER): Payer: 59 | Admitting: Gastroenterology

## 2021-11-01 VITALS — BP 110/70 | HR 81 | Ht 62.0 in | Wt 131.0 lb

## 2021-11-01 DIAGNOSIS — K641 Second degree hemorrhoids: Secondary | ICD-10-CM | POA: Diagnosis not present

## 2021-11-01 NOTE — Telephone Encounter (Signed)
?  Follow up Call- ? ? ?  10/30/2021  ?  7:30 AM  ?Call back number  ?Post procedure Call Back phone  # [phone]  ?Permission to leave phone message Yes  ?  ? ?Patient questions: ? ?Do you have a fever, pain , or abdominal swelling? No. ?Pain Score  0 * ? ?Have you tolerated food without any problems? Yes.   ? ?Have you been able to return to your normal activities? Yes.   ? ?Do you have any questions about your discharge instructions: ?Diet   No. ?Medications  No. ?Follow up visit  No. ? ?Do you have questions or concerns about your Care? No. ? ?Actions: ?* If pain score is 4 or above: ?No action needed, pain <4. ? ? ?

## 2021-11-01 NOTE — Patient Instructions (Addendum)
If you are age 56 or older, your body mass index should be between 23-30. Your Body mass index is 23.96 kg/m?Marland Kitchen If this is out of the aforementioned range listed, please consider follow up with your Primary Care Provider. ? ?If you are age 57 or younger, your body mass index should be between 19-25. Your Body mass index is 23.96 kg/m?Marland Kitchen If this is out of the aformentioned range listed, please consider follow up with your Primary Care Provider.  ? ?________________________________________________________ ? ?The [facility] providers would like to encourage you to use [facility] to communicate with providers for non-urgent requests or questions.  Due to long hold times on the telephone, sending your provider a message by [facility] may be a faster and more efficient way to get a response.  Please allow 48 business hours for a response.  Please remember that this is for non-urgent requests.  ?_______________________________________________________ ? ?HEMORRHOID BANDING PROCEDURE  ? ? FOLLOW-UP CARE ? ? ?The procedure you have had should have been relatively painless since the banding of the area involved does not have nerve endings and there is no pain sensation.  The rubber band cuts off the blood supply to the hemorrhoid and the band may fall off as soon as 48 hours after the banding (the band may occasionally be seen in the toilet bowl following a bowel movement). You may notice a temporary feeling of fullness in the rectum which should respond adequately to plain Tylenol? or Motrin?. ? ?Following the banding, avoid strenuous exercise that evening and resume full activity the next day.  A sitz bath (soaking in a warm tub) or bidet is soothing, and can be useful for cleansing the area after bowel movements.   ? ? ?To avoid constipation, take two tablespoons of natural wheat bran, natural oat bran, flax, Benefiber? or any over the counter fiber supplement and increase your water intake to 7-8 glasses daily.   ? ?Unless you  have been prescribed anorectal medication, do not put anything inside your rectum for two weeks: No suppositories, enemas, fingers, etc. ? ?Occasionally, you may have more bleeding than usual after the banding procedure.  This is often from the untreated hemorrhoids rather than the treated one.  Don?t be concerned if there is a tablespoon or so of blood.  If there is more blood than this, lie flat with your bottom higher than your head and apply an ice pack to the area. If the bleeding does not stop within a half an hour or if you feel faint, call our office at [phone] or go to the emergency room. ? ?Problems are not common; however, if there is a substantial amount of bleeding, severe pain, chills, fever or difficulty passing urine (very rare) or other problems, you should call us at [phone] or report to the nearest emergency room. ? ?Do not stay seated continuously for more than 2-3 hours for a day or two after the procedure.  Tighten your buttock muscles 10-15 times every two hours and take 10-15 deep breaths every 1-2 hours.  Do not spend more than a few minutes on the toilet if you cannot empty your bowel; instead re-visit the toilet at a later time. ? ? ? ?Next appointment is April 24th at 8:20am ? ?It was a pleasure to see you today! ? ?Doristine Locks, D.O. ? ? ? ? ?We want to thank you for trusting [facility] with your care. All of our staff and providers value the  relationships we have built with our patients, and it is an honor to care for you.  ? ?We are writing to let you know that [facility] will close on Dec 23, 2021, and we invite you to continue to see Dr. Edman Circle and Doristine Locks at the [facility] [place] office location. We are consolidating our serices at these [facility] practices to better provide care. Our office staff will work with you to ensure a seamless transition.  ? ?Doristine Locks, DO -Dr. Barron Alvine  will be movig to [facility] at [address] [place], [place] [address], effective Dec 23, 2021.  Contact [phone] to schedule an appointment with him.  ? ?Edman Circle, MD- Dr. Chales Abrahams will be movig to [facility] at [address], [place], [place] [address], effective Dec 23, 2021.  Contact [phone] to schedule an appointment with him.  ? ?Requesting Medical Records ?If you need to request your medical records, please follow the instructions below. Your medical records are confidential, and a copy can be transferred to another provider or released to you or another person you designate only with your permission. ? ?There are several ways to request your medical records: ?Requests for medical records can be submitted through our practice.   ?You can also request your records electronically, in your MyChart account by selecting the ?Request Health Records? tab.  ?If you need additional information on how to request records, please go to [email or web address], choose Patient Information, then select Request Medical Records. ?To make an appointment or if you have any questions about your health care needs, please contact our office at [phone] and one of our staff members will be glad to assist you. ?[facility] is committed to providing exceptional care for you and our community. Thank you for allowing [place] to serve your health care needs. ?Sincerely, ? ?Trixie Dredge, Director [facility] ?[facility] also offers convenient virtual care options. Sore throat? Sinus problems? Cold or flu symptoms? Get care from the comfort of home with [facility] Video Visits and e-Visits. Learn more about the non-emergency conditions treated and start your virtual visit at [email or web address] ? ? ?

## 2021-11-01 NOTE — Progress Notes (Signed)
? ?Chief Complaint:    Symptomatic Internal Hemorrhoids; Hemorrhoid Band Ligation ? ?GI History: 56 year old female with a history of migraines, depression, fibromyalgia, interstitial cystitis, ccy, partial hysterectomy, benign liver cysts, admitted on 07/03/2020 with possible new onset seizures.  GI service was consulted due to RUQ pain times months and dysphagia. ?-CT A/P: Stable 3.8 x 2.8 cm cystic areas in the posterior liver dome with adjacent stable subcentimeter liver cyst. Similar to 06/2018. ?-EGD (07/09/2020, Dr. Bryan Lemma): Normal Z-line, normal esophagus with empiric 1 French Maloney dilation with mucosal rent at 18 cm c/w successful proximal dilation.  Esophageal biopsies with mild reflux changes in distal esophagus; normal proximal.  Non-H. pylori gastritis.  Started pantoprazole 40 mg/day, famotidine 20 mg every morning, Carafate 1 g qid. ?-Worsening abdominal pain with Carafate so this was discontinued ?- 07/31/2020: Follow-up in GI clinic.  Continued mild RUQ pain, sometimes worse after eating.   ?- 10/09/2020: Follow-up in the GI clinic.  Feeling much better, reflux well controlled with Protonix and Pepcid qhs.  Dysphagia resolved with previous Maloney dilation. ?  ?Has since followed with neurology at [facility].  Diagnosed with nonepileptic spells. ?  ?-Colonoscopy 2004 at outside facility was unremarkable .  Due for repeat colonoscopy for ongoing CRC screening. ?- Colonoscopy (10/30/2021): Normal colon.  Grade 2 internal hemorrhoids.  Normal TI.  Repeat 10 years ? ?Reflux well controlled with Protonix 40 mg/day and Pepcid qhs. ? ?HPI:   ? ? ?Patient is a 56 y.o. femalewith a history of symptomatic internal hemorrhoids presenting to the Gastroenterology Clinic for follow-up and ongoing treatment. The patient presents with symptomatic grade 2 hemorrhoids, unresponsive to maximal medical therapy, requesting rubber band ligation of symptomatic hemorrhoidal disease. ? ?No change in medical or surgical  history, medications, allergies, social history since last appointment with me. ? ? ?Review of systems:     No chest pain, no SOB, no fevers, no urinary sx  ? ?Past Medical History:  ?Diagnosis Date  ? Depression   ? Fibromyalgia   ? GERD (gastroesophageal reflux disease)   ? Migraine   ? PONV (postoperative nausea and vomiting)   ? ? ?Patient's surgical history, family medical history, social history, medications and allergies were all reviewed in Epic  ? ? ?Current Outpatient Medications  ?Medication Sig Dispense Refill  ? ciprofloxacin (CIPRO) 500 MG tablet Take 1 tablet (500 mg total) by mouth every 12 (twelve) hours. 10 tablet 0  ? cyclobenzaprine (FLEXERIL) 10 MG tablet Take 1 tablet (10 mg total) by mouth 3 (three) times daily as needed for muscle spasms. 30 tablet 0  ? Famotidine (PEPCID PO) Take by mouth at bedtime. Taking once daily, patient does not recall the dose    ? fexofenadine (ALLEGRA) 180 MG tablet Take 180 mg by mouth as directed. Monday Wednesday and Friday    ? levocetirizine (XYZAL) 5 MG tablet Take 5 mg by mouth as directed. Tuesday Thursday Saturday and Sunday    ? magnesium 30 MG tablet Take 30 mg by mouth every other day.    ? methocarbamol (ROBAXIN) 500 MG tablet Take 1 tablet (500 mg total) by mouth every 8 (eight) hours as needed for muscle spasms. 30 tablet 0  ? Multiple Vitamin (MULTIVITAMIN WITH MINERALS) TABS tablet Take 1 tablet by mouth daily.    ? pantoprazole (PROTONIX) 40 MG tablet Take 1 tablet (40 mg total) by mouth 2 (two) times daily for 30 days, THEN 1 tablet (40 mg total) daily. 90 tablet 3  ? Topiramate  ER (TROKENDI XR) 100 MG CP24 Take 1 capsule by mouth daily.    ? vitamin E 1000 UNIT capsule Take 1,000 Units by mouth daily.    ? ?No current facility-administered medications for this visit.  ? ? ?Physical Exam:   ? ? ?There were no vitals taken for this visit. ? ?GENERAL:  Pleasant female in NAD ?PSYCH: : Cooperative, normal affect ?NEURO: Alert and oriented x 3, no  focal neurologic deficits ?Rectal exam: Sensation intact and preserved anal wink.  External skin tags.  Grade 2 hemorrhoids noted in all positions on anoscopy.  No external anal fissures noted. Normal sphincter tone. No palpable mass. No blood on the exam glove. (Chaperone: Curlene Labrum, CMA). ? ? ?IMPRESSION and PLAN:   ? ?#1.  Symptomatic internal hemorrhoids: ?PROCEDURE NOTE: ?The patient presents with symptomatic grade 2 hemorrhoids, unresponsive to maximal medical therapy, requesting rubber band ligation of symptomatic hemorrhoidal disease.  All risks, benefits and alternative forms of therapy were described and informed consent was obtained. ? ?In the Left Lateral Decubitus position, anoscopic examination revealed grade 2 hemorrhoids in the all position(s).  ?The anorectum was pre-medicated with RectiCare. ?The decision was made to band the LL internal hemorrhoid, and the [place] O?Regan System was used to perform band ligation without complication.  ?Digital anorectal examination was then performed to assure proper positioning of the band, and to adjust the banded tissue as required. ? The patient was discharged home without pain or other issues.  Dietary and behavioral recommendations were given and along with follow-up instructions.   ?  ?The following adjunctive treatments were recommended: ? ?-Resume high-fiber diet with fiber supplement (i.e. Citrucel or Benefiber) with goal for soft stools without straining to have a BM. ?-Resume adequate fluid intake. ? ?The patient will return in 4 weeks for  follow-up and possible additional banding as required. ?No complications were encountered and the patient tolerated the procedure well. ? ?   ? ?#2.  GERD ?- Well-controlled on current therapy ?    ? ?Lavena Bullion ,DO, FACG 11/01/2021, 8:06 AM ? ?

## 2021-12-02 ENCOUNTER — Encounter: Payer: Self-pay | Admitting: Gastroenterology

## 2021-12-02 ENCOUNTER — Ambulatory Visit: Payer: 59 | Admitting: Gastroenterology

## 2021-12-02 DIAGNOSIS — K641 Second degree hemorrhoids: Secondary | ICD-10-CM

## 2021-12-02 NOTE — Progress Notes (Signed)
? ?Chief Complaint:    Symptomatic Internal Hemorrhoids; Hemorrhoid Band Ligation ? ?GI History: 56 year old female with a history of migraines, depression, fibromyalgia, interstitial cystitis, ccy, partial hysterectomy, benign liver cysts, admitted on 07/03/2020 with possible new onset seizures.  GI service was consulted due to RUQ pain times months and dysphagia. ?-CT A/P: Stable 3.8 x 2.8 cm cystic areas in the posterior liver dome with adjacent stable subcentimeter liver cyst. Similar to 06/2018. ?-EGD (07/09/2020, Dr. Barron Alvine): Normal Z-line, normal esophagus with empiric 54 French Maloney dilation with mucosal rent at 18 cm c/w successful proximal dilation.  Esophageal biopsies with mild reflux changes in distal esophagus; normal proximal.  Non-H. pylori gastritis.  Started pantoprazole 40 mg/day, famotidine 20 mg every morning, Carafate 1 g qid. ?-Worsening abdominal pain with Carafate so this was discontinued ?- 07/31/2020: Follow-up in GI clinic.  Continued mild RUQ pain, sometimes worse after eating.   ?- 10/09/2020: Follow-up in the GI clinic.  Feeling much better, reflux well controlled with Protonix and Pepcid qhs.  Dysphagia resolved with previous Maloney dilation. ?  ?Has since followed with neurology at [facility].  Diagnosed with nonepileptic spells. ?  ?-Colonoscopy 2004 at outside facility was unremarkable .  Due for repeat colonoscopy for ongoing CRC screening. ?- Colonoscopy (10/30/2021): Normal colon.  Grade 2 internal hemorrhoids.  Normal TI.  Repeat 10 years ?  ?Reflux well controlled with Protonix 40 mg/day and Pepcid qhs. ? ?Additionally, history of symptomatic hemorrhoids, with grade 2 hemorrhoids noted on colonoscopy. ?- 11/01/2021: Banding of LL hemorrhoid ?- 12/02/2021: Hemorrhoid banding #2 ? ?HPI:   ? ? ?Patient is a 56 y.o. femalewithwith a history of symptomatic internal hemorrhoids presenting to the Gastroenterology Clinic for follow-up and ongoing treatment. The patient presents with  symptomatic grade 2 hemorrhoids, unresponsive to maximal medical therapy, requesting rubber band ligation of symptomatic hemorrhoidal disease. ? ?Did well with the first hemorrhoid banding.  Presents today for hemorrhoid banding #2. ? ?No change in medical or surgical history, medications, allergies, social history since last appointment with me. ? ? ?Review of systems:     No chest pain, no SOB, no fevers, no urinary sx  ? ?Past Medical History:  ?Diagnosis Date  ? Depression   ? Fibromyalgia   ? GERD (gastroesophageal reflux disease)   ? Migraine   ? PONV (postoperative nausea and vomiting)   ? ? ?Patient's surgical history, family medical history, social history, medications and allergies were all reviewed in Epic  ? ? ?Current Outpatient Medications  ?Medication Sig Dispense Refill  ? cyclobenzaprine (FLEXERIL) 10 MG tablet Take 1 tablet (10 mg total) by mouth 3 (three) times daily as needed for muscle spasms. 30 tablet 0  ? Famotidine (PEPCID PO) Take by mouth at bedtime. Taking once daily, patient does not recall the dose    ? fexofenadine (ALLEGRA) 180 MG tablet Take 180 mg by mouth as directed. Monday Wednesday and Friday    ? levocetirizine (XYZAL) 5 MG tablet Take 5 mg by mouth as directed. Tuesday Thursday Saturday and Sunday    ? magnesium 30 MG tablet Take 30 mg by mouth every other day.    ? methocarbamol (ROBAXIN) 500 MG tablet Take 1 tablet (500 mg total) by mouth every 8 (eight) hours as needed for muscle spasms. 30 tablet 0  ? Multiple Vitamin (MULTIVITAMIN WITH MINERALS) TABS tablet Take 1 tablet by mouth daily.    ? pantoprazole (PROTONIX) 40 MG tablet Take 1 tablet (40 mg total) by mouth 2 (two)  times daily for 30 days, THEN 1 tablet (40 mg total) daily. 90 tablet 3  ? Topiramate ER (TROKENDI XR) 100 MG CP24 Take 1 capsule by mouth daily.    ? vitamin E 1000 UNIT capsule Take 1,000 Units by mouth daily.    ? ?No current facility-administered medications for this visit.  ? ? ?Physical Exam:    ? ? ?There were no vitals taken for this visit. ? ?GENERAL:  Pleasant female in NAD ?PSYCH: : Cooperative, normal affect ?NEURO: Alert and oriented x 3 ?Rectal exam: Sensation intact and preserved anal wink.  Grade 2 hemorrhoids noted in RP and RA positions on exam.  No external anal fissures noted. Normal sphincter tone. No palpable mass. No blood on the exam glove. (Chaperone: Latricia Heft, CMA). ? ? ?IMPRESSION and PLAN:   ? ?#1.  Symptomatic internal hemorrhoids: ?PROCEDURE NOTE: ?The patient presents with symptomatic grade 2 hemorrhoids, unresponsive to maximal medical therapy, requesting rubber band ligation of symptomatic hemorrhoidal disease.  All risks, benefits and alternative forms of therapy were described and informed consent was obtained. ? ?In the Left Lateral Decubitus position, anoscopic examination revealed grade 2 hemorrhoids in the RP and RA position(s).  ?The anorectum was pre-medicated with RectiCare. ?The decision was made to band the RP internal hemorrhoid, and the [facility] O?Regan System was used to perform band ligation without complication.  ?Digital anorectal examination was then performed to assure proper positioning of the band, and to adjust the banded tissue as required. ? The patient was discharged home without pain or other issues.  Dietary and behavioral recommendations were given and along with follow-up instructions.   ?  ?The following adjunctive treatments were recommended: ? ?-Resume high-fiber diet with fiber supplement (i.e. Citrucel or Benefiber) with goal for soft stools without straining to have a BM. ?-Resume adequate fluid intake. ? ?The patient will return in 4 for  follow-up and possible additional banding as required. ?No complications were encountered and the patient tolerated the procedure well. ? ?   ? ?#2.  GERD ?- Well-controlled on current therapy ?    ? ?Shellia Cleverly ,DO, FACG 12/02/2021, 8:10 AM ? ?

## 2021-12-02 NOTE — Patient Instructions (Addendum)
If you are age 56 or younger, your body mass index should be between 19-25. Your There is no height or weight on file to calculate BMI. If this is out of the aformentioned range listed, please consider follow up with your Primary Care Provider.  ? ?__________________________________________________________ ? ?The [place] [facility] providers would like to encourage you to use [facility] to communicate with providers for non-urgent requests or questions.  Due to long hold times on the telephone, sending your provider a message by [facility] may be a faster and more efficient way to get a response.  Please allow 48 business hours for a response.  Please remember that this is for non-urgent requests.  ? ?Due to recent changes in healthcare laws, you may see the results of your imaging and laboratory studies on MyChart before your provider has had a chance to review them.  We understand that in some cases there may be results that are confusing or concerning to you. Not all laboratory results come back in the same time frame and the provider may be waiting for multiple results in order to interpret others.  Please give [place] 48 hours in order for your provider to thoroughly review all the results before contacting the office for clarification of your results.  ? ?You have been scheduled for an appointment with Dr. Bryan Lemma on 01/01/22 at 8:40 am . Please arrive 10 minutes early for your appointment.  ? ?HEMORRHOID BANDING PROCEDURE  ? ? FOLLOW-UP CARE ? ? ?The procedure you have had should have been relatively painless since the banding of the area involved does not have nerve endings and there is no pain sensation.  The rubber band cuts off the blood supply to the hemorrhoid and the band may fall off as soon as 48 hours after the banding (the band may occasionally be seen in the toilet bowl following a bowel movement). You may notice a temporary feeling of fullness in the rectum which should respond adequately to plain Tylenol? or  Motrin?. ? ?Following the banding, avoid strenuous exercise that evening and resume full activity the next day.  A sitz bath (soaking in a warm tub) or bidet is soothing, and can be useful for cleansing the area after bowel movements.   ? ? ?To avoid constipation, take two tablespoons of natural wheat bran, natural oat bran, flax, Benefiber? or any over the counter fiber supplement and increase your water intake to 7-8 glasses daily.   ? ?Unless you have been prescribed anorectal medication, do not put anything inside your rectum for two weeks: No suppositories, enemas, fingers, etc. ? ?Occasionally, you may have more bleeding than usual after the banding procedure.  This is often from the untreated hemorrhoids rather than the treated one.  Don?t be concerned if there is a tablespoon or so of blood.  If there is more blood than this, lie flat with your bottom higher than your head and apply an ice pack to the area. If the bleeding does not stop within a half an hour or if you feel faint, call our office at [phone] or go to the emergency room. ? ?Problems are not common; however, if there is a substantial amount of bleeding, severe pain, chills, fever or difficulty passing urine (very rare) or other problems, you should call us at [phone] or report to the nearest emergency room. ? ?Do not stay seated continuously for more than 2-3 hours for a day or two after the procedure.  Tighten your  buttock muscles 10-15 times every two hours and take 10-15 deep breaths every 1-2 hours.  Do not spend more than a few minutes on the toilet if you cannot empty your bowel; instead re-visit the toilet at a later time. ? ? Thank you for choosing me and [facility]. ? ?Gerrit Heck, D.O. ? ? ? ?We want to thank you for trusting [facility] with your care. All of our staff and providers value the relationships we have built with our patients, and it is an honor to care for you.   ? ?We are writing to let you know that [facility] will close on Dec 23, 2021, and we invite you to continue to see Dr. Carmell Austria and Gerrit Heck at the [facility] [place] office location. We are consolidating our serices at these [facility] practices to better provide care. Our office staff will work with you to ensure a seamless transition.  ? ?Gerrit Heck, DO -Dr. Bryan Lemma will be movig to [facility] at [address] [place], [place] [address], effective Dec 23, 2021.  Contact [phone] to schedule an appointment with him.  ? ?Carmell Austria, MD- Dr. Lyndel Safe will be movig to [facility] at [address], [place], [place] [address], effective Dec 23, 2021.  Contact [phone] to schedule an appointment with him.  ? ?Requesting Medical Records ?If you need to request your medical records, please follow the instructions below. Your medical records are confidential, and a copy can be transferred to another provider or released to you or another person you designate only with your permission. ? ?There are several ways to request your medical records: ?Requests for medical records can be submitted through our practice.   ?You can also request your records electronically, in your MyChart account by selecting the ?Request Health Records? tab.  ?If you need additional information on how to request records, please go to [email or web address]/, choose Patient Information, then select Request Medical Records. ?To make an appointment or if you have any questions about your health care needs, please contact our office at [phone] and one of our staff members will be glad to assist you. ?[facility] is committed to providing exceptional care for you and our community. Thank you for allowing [place] to serve your health care needs. ?Sincerely, ? ?Windy Canny, Director [facility] ?[facility] also offers convenient virtual care options.  Sore throat? Sinus problems? Cold or flu symptoms? Get care from the comfort of home with [facility] Video Visits and e-Visits. Learn more about the non-emergency conditions treated and start your virtual visit at [email or web address]  ? ?

## 2021-12-27 ENCOUNTER — Other Ambulatory Visit: Payer: Self-pay | Admitting: Gastroenterology

## 2022-01-01 ENCOUNTER — Ambulatory Visit: Payer: 59 | Admitting: Gastroenterology

## 2022-01-10 ENCOUNTER — Ambulatory Visit: Payer: 59 | Admitting: Gastroenterology

## 2022-01-10 ENCOUNTER — Encounter: Payer: Self-pay | Admitting: Gastroenterology

## 2022-01-10 VITALS — BP 102/70 | HR 81 | Ht 62.0 in | Wt 135.2 lb

## 2022-01-10 DIAGNOSIS — K641 Second degree hemorrhoids: Secondary | ICD-10-CM

## 2022-01-10 NOTE — Patient Instructions (Signed)
If you are age 56 or younger, your body mass index should be between 19-25. Your There is no height or weight on file to calculate BMI. If this is out of the aformentioned range listed, please consider follow up with your Primary Care Provider.   __________________________________________________________  The [place] [facility] providers would like to encourage you to use [facility] to communicate with providers for non-urgent requests or questions.  Due to long hold times on the telephone, sending your provider a message by [facility] may be a faster and more efficient way to get a response.  Please allow 48 business hours for a response.  Please remember that this is for non-urgent requests.    Follow up as needed.   HEMORRHOID BANDING PROCEDURE    FOLLOW-UP CARE   The procedure you have had should have been relatively painless since the banding of the area involved does not have nerve endings and there is no pain sensation.  The rubber band cuts off the blood supply to the hemorrhoid and the band may fall off as soon as 48 hours after the banding (the band may occasionally be seen in the toilet bowl following a bowel movement). You may notice a temporary feeling of fullness in the rectum which should respond adequately to plain Tylenol or Motrin.  Following the banding, avoid strenuous exercise that evening and resume full activity the next day.  A sitz bath (soaking in a warm tub) or bidet is soothing, and can be useful for cleansing the area after bowel movements.     To avoid constipation, take two tablespoons of natural wheat bran, natural oat bran, flax, Benefiber or any over the counter fiber supplement and increase your water intake to 7-8 glasses daily.    Unless you have been prescribed anorectal medication, do not put anything inside your rectum for two weeks: No suppositories, enemas, fingers, etc.  Occasionally, you may have more bleeding than usual after the banding procedure.  This is  often from the untreated hemorrhoids rather than the treated one.  Don't be concerned if there is a tablespoon or so of blood.  If there is more blood than this, lie flat with your bottom higher than your head and apply an ice pack to the area. If the bleeding does not stop within a half an hour or if you feel faint, call our office at [phone] or go to the emergency room.  Problems are not common; however, if there is a substantial amount of bleeding, severe pain, chills, fever or difficulty passing urine (very rare) or other problems, you should call us at [phone] or report to the nearest emergency room.  Do not stay seated continuously for more than 2-3 hours for a day or two after the procedure.  Tighten your buttock muscles 10-15 times every two hours and take 10-15 deep breaths every 1-2 hours.  Do not spend more than a few minutes on the toilet if you cannot empty your bowel; instead re-visit the toilet at a later time.     Thank you for choosing me and [facility].  Vito Cirigliano, D.O.

## 2022-01-10 NOTE — Progress Notes (Signed)
Chief Complaint:    Symptomatic Internal Hemorrhoids; Hemorrhoid Band Ligation  GI History: 56 year old female with a history of migraines, depression, fibromyalgia, interstitial cystitis, ccy, partial hysterectomy, benign liver cysts, admitted on 07/03/2020 with possible new onset seizures.  GI service was consulted due to RUQ pain times months and dysphagia. -CT A/P: Stable 3.8 x 2.8 cm cystic areas in the posterior liver dome with adjacent stable subcentimeter liver cyst. Similar to 06/2018. -EGD (07/09/2020, Dr. Bryan Lemma): Normal Z-line, normal esophagus with empiric 71 French Maloney dilation with mucosal rent at 18 cm c/w successful proximal dilation.  Esophageal biopsies with mild reflux changes in distal esophagus; normal proximal.  Non-H. pylori gastritis.  Started pantoprazole 40 mg/day, famotidine 20 mg every morning, Carafate 1 g qid. -Worsening abdominal pain with Carafate so this was discontinued - 07/31/2020: Follow-up in GI clinic.  Continued mild RUQ pain, sometimes worse after eating.   - 10/09/2020: Follow-up in the GI clinic.  Feeling much better, reflux well controlled with Protonix and Pepcid qhs.  Dysphagia resolved with previous Maloney dilation.   Has since followed with neurology at [facility].  Diagnosed with nonepileptic spells.   -Colonoscopy 2004 at outside facility was unremarkable .  Due for repeat colonoscopy for ongoing CRC screening. - Colonoscopy (10/30/2021): Normal colon.  Grade 2 internal hemorrhoids.  Normal TI.  Repeat 10 years   Reflux well controlled with Protonix 40 mg/day and Pepcid qhs.   Additionally, history of symptomatic hemorrhoids, with grade 2 hemorrhoids noted on colonoscopy. - 11/01/2021: Banding of LL hemorrhoid - 12/02/2021: Banding of RP hemorrhoid - 01/10/2022: Hemorrhoid banding #3  HPI:     Patient is a 56 y.o. femalewith a history of symptomatic internal hemorrhoids presenting to the Gastroenterology Clinic for follow-up and ongoing  treatment. The patient presents with symptomatic grade 2 hemorrhoids, unresponsive to maximal medical therapy, requesting rubber band ligation of symptomatic hemorrhoidal disease.  Has done well with each of the first 2 hemorrhoid bandings.  No change in medical or surgical history, medications, allergies, social history since last appointment with me.   Review of systems:     No chest pain, no SOB, no fevers, no urinary sx   Past Medical History:  Diagnosis Date   Depression    Fibromyalgia    GERD (gastroesophageal reflux disease)    Migraine    PONV (postoperative nausea and vomiting)     Patient's surgical history, family medical history, social history, medications and allergies were all reviewed in Epic    Current Outpatient Medications  Medication Sig Dispense Refill   cyclobenzaprine (FLEXERIL) 10 MG tablet Take 1 tablet (10 mg total) by mouth 3 (three) times daily as needed for muscle spasms. 30 tablet 0   Famotidine (PEPCID PO) Take by mouth at bedtime. Taking once daily, patient does not recall the dose     fexofenadine (ALLEGRA) 180 MG tablet Take 180 mg by mouth as directed. Monday Wednesday and Friday     levocetirizine (XYZAL) 5 MG tablet Take 5 mg by mouth as directed. Tuesday Thursday Saturday and Sunday     magnesium 30 MG tablet Take 30 mg by mouth every other day.     methocarbamol (ROBAXIN) 500 MG tablet Take 1 tablet (500 mg total) by mouth every 8 (eight) hours as needed for muscle spasms. 30 tablet 0   Multiple Vitamin (MULTIVITAMIN WITH MINERALS) TABS tablet Take 1 tablet by mouth daily.     pantoprazole (PROTONIX) 40 MG tablet TAKE 1 TABLET BY MOUTH TWICE  DAILY FOR 30 DAYS, THEN 1 TABLET DAILY 90 tablet 3   Topiramate ER (TROKENDI XR) 100 MG CP24 Take 1 capsule by mouth daily.     vitamin E 1000 UNIT capsule Take 1,000 Units by mouth daily.     No current facility-administered medications for this visit.    Physical Exam:     BP 102/70   Pulse 81    Ht 5\' 2"  (1.575 m)   Wt 135 lb 4 oz (61.3 kg)   BMI 24.74 kg/m   GENERAL:  Pleasant female in NAD PSYCH: : Cooperative, normal affect NEURO: Alert and oriented x 3, no focal neurologic deficits Rectal exam: Sensation intact and preserved anal wink.  Small external skin tag. Grade 1 hemorrhoid noted in RA position.  No external anal fissures noted. Normal sphincter tone. No palpable mass. No blood on the exam glove. (Chaperone: Curlene Labrum, CMA).   IMPRESSION and PLAN:    #1.  Symptomatic internal hemorrhoids: PROCEDURE NOTE: The patient presents with symptomatic grade 1-2 hemorrhoids, unresponsive to maximal medical therapy, requesting rubber band ligation of symptomatic hemorrhoidal disease.  All risks, benefits and alternative forms of therapy were described and informed consent was obtained.  In the Left Lateral Decubitus position, anoscopic examination revealed grade 1 hemorrhoids in the RA position(s).  The anorectum was pre-medicated with RectiCare. The decision was made to band the RA internal hemorrhoid, and the [place] was used to perform band ligation without complication.  First banding placed with small purchase of tissue.  Band was removed digitally and a second band was placed in appropriate position. Digital anorectal examination was then performed to assure proper positioning of the band, and to adjust the banded tissue as required.  The patient was discharged home without pain or other issues.  Dietary and behavioral recommendations were given and along with follow-up instructions.     The following adjunctive treatments were recommended:  -Resume high-fiber diet with fiber supplement (i.e. Citrucel or Benefiber) with goal for soft stools without straining to have a BM. -Resume adequate fluid intake.  The patient will return as needed if return of hemorrhoid symptoms for follow-up and possible additional banding as required. No complications were encountered  and the patient tolerated the procedure well.      #2.  GERD - Well-controlled on current therapy      Lavena Bullion ,DO, FACG 01/10/2022, 9:28 AM

## 2022-01-27 ENCOUNTER — Encounter: Payer: Self-pay | Admitting: Family Medicine

## 2022-01-27 ENCOUNTER — Ambulatory Visit (INDEPENDENT_AMBULATORY_CARE_PROVIDER_SITE_OTHER): Payer: 59 | Admitting: Family Medicine

## 2022-01-27 VITALS — BP 130/74 | HR 80 | Temp 97.8°F | Resp 16 | Ht 62.0 in | Wt 135.1 lb

## 2022-01-27 DIAGNOSIS — R300 Dysuria: Secondary | ICD-10-CM | POA: Diagnosis not present

## 2022-01-27 LAB — POCT URINALYSIS DIPSTICK
Glucose, UA: NEGATIVE
Leukocytes, UA: NEGATIVE
Protein, UA: NEGATIVE
Spec Grav, UA: 1.015 (ref 1.010–1.025)
Urobilinogen, UA: 0.2 E.U./dL
pH, UA: 6 (ref 5.0–8.0)

## 2022-01-27 MED ORDER — NITROFURANTOIN MONOHYD MACRO 100 MG PO CAPS: 100.0000 mg | ORAL_CAPSULE | Freq: Two times a day (BID) | ORAL | 0 refills | Status: DC

## 2022-01-27 NOTE — Patient Instructions (Signed)
Follow up as needed or as scheduled START the Macrobid twice daily- take w/ food Drink LOTS of water Call with any questions or concerns Hang in there!!!

## 2022-01-27 NOTE — Progress Notes (Signed)
   Subjective:    Patient ID: Martha Moss, female    DOB: [birth year], 56 y.o.   MRN: [id number]  HPI UTI- pt has hx of interstitial cystitis.  Sxs started ~2 days ago.  Burning w/ urination.  Suprapubic pain.  Increased frequency and urgency w/ minimal output.  No hematuria.  Last UTI was 3 months ago.  Had low grade temp this morning.   Review of Systems For ROS see HPI     Objective:   Physical Exam Vitals reviewed.  Constitutional:      General: She is not in acute distress.    Appearance: Normal appearance. She is well-developed. She is not ill-appearing.  Abdominal:     General: Abdomen is flat. There is no distension.     Palpations: Abdomen is soft.     Tenderness: There is no abdominal tenderness (no suprapubic or CVA tenderness). There is no guarding or rebound.  Skin:    General: Skin is warm and dry.  Neurological:     General: No focal deficit present.     Mental Status: She is alert and oriented to person, place, and time.  Psychiatric:        Mood and Affect: Mood normal.        Behavior: Behavior normal.        Thought Content: Thought content normal.           Assessment & Plan:   Dysuria- new.  Pt's sxs consistent w/ UTI.  Given multiple medication allergies will start Macrobid while awaiting urine culture.  Pt expressed understanding and is in agreement w/ plan.

## 2022-01-30 ENCOUNTER — Telehealth: Payer: Self-pay

## 2022-01-30 LAB — URINE CULTURE
MICRO NUMBER:: 13543334
SPECIMEN QUALITY:: ADEQUATE

## 2022-01-30 NOTE — Telephone Encounter (Signed)
-----   Message from Sheliah Hatch, MD sent at 01/30/2022  8:00 AM EDT ----- Your UTI is being appropriately.  I hope you are feeling better!

## 2022-01-30 NOTE — Telephone Encounter (Signed)
Left pt a VM stating results  

## 2022-05-02 ENCOUNTER — Other Ambulatory Visit (INDEPENDENT_AMBULATORY_CARE_PROVIDER_SITE_OTHER): Payer: Self-pay | Admitting: Nurse Practitioner

## 2022-05-02 DIAGNOSIS — R200 Anesthesia of skin: Secondary | ICD-10-CM

## 2022-05-04 DIAGNOSIS — I739 Peripheral vascular disease, unspecified: Secondary | ICD-10-CM | POA: Insufficient documentation

## 2022-05-04 NOTE — Progress Notes (Deleted)
MRN : [id number]  Martha Moss is a 56 y.o. (26-Dec-1965) female who presents with chief complaint of check circulation.  History of Present Illness:    The patient is seen for evaluation of painful lower extremities and diminished pulses. Patient notes the pain is always associated with activity and is very consistent day today. Typically, the pain occurs at less than one block, progress is as activity continues to the point that the patient must stop walking. Resting including standing still for several minutes allows the patient to walk a similar distance before being forced to stop again. Uneven terrain and inclines shorten the distance. The pain has been progressive over the past several years. The patient denies any abrupt changes in claudication symptoms.  The patient states the inability to walk is causing problems with daily activities.  The patient denies rest pain or dangling of an extremity off the side of the bed during the night for relief. No open wounds or sores at this time. No prior interventions or surgeries.  No history of back problems or DJD of the lumbar sacral spine.   The patient's blood pressure has been stable and relatively well controlled. The patient denies amaurosis fugax or recent TIA symptoms. There are no recent neurological changes noted. The patient denies history of DVT, PE or superficial thrombophlebitis. The patient denies recent episodes of angina or shortness of breath.    No outpatient medications have been marked as taking for the 05/05/22 encounter (Appointment) with Delana Meyer, Dolores Lory, MD.    Past Medical History:  Diagnosis Date   Depression    Fibromyalgia    GERD (gastroesophageal reflux disease)    Migraine    PONV (postoperative nausea and vomiting)     Past Surgical History:  Procedure Laterality Date   BIOPSY  07/06/2020   Procedure: BIOPSY;  Surgeon: Lavena Bullion, DO;  Location: [place] [facility];   Service: Gastroenterology;;   CHOLECYSTECTOMY     ESOPHAGOGASTRODUODENOSCOPY Left 07/06/2020   Procedure: ESOPHAGOGASTRODUODENOSCOPY (EGD);  Surgeon: Lavena Bullion, DO;  Location: [facility];  Service: Gastroenterology;  Laterality: Left;   ETHMOIDECTOMY Bilateral 09/09/2016   Procedure: ETHMOIDECTOMY;  Surgeon: Leta Baptist, MD;  Location: [place];  Service: ENT;  Laterality: Bilateral;   FRONTAL SINUS EXPLORATION Bilateral 09/09/2016   Procedure: FRONTAL RECESS SINUS EXPLORATION;  Surgeon: Leta Baptist, MD;  Location: [place];  Service: ENT;  Laterality: Bilateral;   MALONEY DILATION  07/06/2020   Procedure: Venia Minks DILATION;  Surgeon: Lavena Bullion, DO;  Location: [place] [facility];  Service: Gastroenterology;;   MAXILLARY ANTROSTOMY Bilateral 09/09/2016   Procedure: MAXILLARY ANTROSTOMY WITH TISSUE REMOVAL;  Surgeon: Leta Baptist, MD;  Location: [place];  Service: ENT;  Laterality: Bilateral;   OVARIAN CYST SURGERY     SINUS ENDO W/FUSION Bilateral 09/09/2016   Procedure: ENDOSCOPIC SINUS SURGERY WITH NAVIGATION;  Surgeon: Leta Baptist, MD;  Location: [place];  Service: ENT;  Laterality: Bilateral;   TOTAL VAGINAL HYSTERECTOMY     TURBINATE REDUCTION Bilateral 09/09/2016   Procedure: BILATERAL TURBINATE REDUCTION;  Surgeon: Leta Baptist, MD;  Location: [place];  Service: ENT;  Laterality: Bilateral;    Social History Social History   Tobacco Use   Smoking status: Never   Smokeless tobacco: Never  Vaping Use   Vaping Use: Never used  Substance Use Topics   Alcohol use:  Yes    Comment: rarely   Drug use: No    Family History Family History  Problem Relation Age of Onset   Kidney Stones Daughter    Migraines Mother    Heart attack Father 40   Diabetes Maternal Grandmother    Stroke Sister    Migraines Brother    Stroke Brother    Heart disease Brother    Migraines Other        many family members on  maternal side    Colon cancer Neg Hx    Pancreatic cancer Neg Hx    Esophageal cancer Neg Hx     Allergies  Allergen Reactions   Bee Venom    Azithromycin     REACTION: hives   Diphenhydramine Hcl     REACTION: hives   Erythromycin     REACTION: hives   Latex     REACTION: rash   Morphine And Related Nausea And Vomiting   Penicillins     REACTION: rash, fever   Percocet [Oxycodone-Acetaminophen] Other (See Comments)    Hallucinations    Sulfonamide Derivatives     REACTION: rash, fever   Tetracyclines & Related      REVIEW OF SYSTEMS (Negative unless checked)  Constitutional: [] Weight loss  [] Fever  [] Chills Cardiac: [] Chest pain   [] Chest pressure   [] Palpitations   [] Shortness of breath when laying flat   [] Shortness of breath with exertion. Vascular:  [x] Pain in legs with walking   [] Pain in legs at rest  [] History of DVT   [] Phlebitis   [] Swelling in legs   [] Varicose veins   [] Non-healing ulcers Pulmonary:   [] Uses home oxygen   [] Productive cough   [] Hemoptysis   [] Wheeze  [] COPD   [] Asthma Neurologic:  [] Dizziness   [] Seizures   [] History of stroke   [] History of TIA  [] Aphasia   [] Vissual changes   [] Weakness or numbness in arm   [] Weakness or numbness in leg Musculoskeletal:   [] Joint swelling   [] Joint pain   [] Low back pain Hematologic:  [] Easy bruising  [] Easy bleeding   [] Hypercoagulable state   [] Anemic Gastrointestinal:  [] Diarrhea   [] Vomiting  [] Gastroesophageal reflux/heartburn   [] Difficulty swallowing. Genitourinary:  [] Chronic kidney disease   [] Difficult urination  [] Frequent urination   [] Blood in urine Skin:  [] Rashes   [] Ulcers  Psychological:  [] History of anxiety   []  History of major depression.  Physical Examination  There were no vitals filed for this visit. There is no height or weight on file to calculate BMI. Gen: WD/WN, NAD Head: [place]/AT, No temporalis wasting.  Ear/Nose/Throat: Hearing grossly intact, nares w/o erythema or  drainage Eyes: PER, EOMI, sclera nonicteric.  Neck: Supple, no masses.  No bruit or JVD.  Pulmonary:  Good air movement, no audible wheezing, no use of accessory muscles.  Cardiac: RRR, normal S1, S2, no Murmurs. Vascular:  mild trophic changes, no open wounds Vessel Right Left  Radial Palpable Palpable  PT Not Palpable Not Palpable  DP Not Palpable Not Palpable  Gastrointestinal: soft, non-distended. No guarding/no peritoneal signs.  Musculoskeletal: M/S 5/5 throughout.  No visible deformity.  Neurologic: CN 2-12 intact. Pain and light touch intact in extremities.  Symmetrical.  Speech is fluent. Motor exam as listed above. Psychiatric: Judgment intact, Mood & affect appropriate for pt's clinical situation. Dermatologic: No rashes or ulcers noted.  No changes consistent with cellulitis.   CBC Lab Results  Component Value Date   WBC 5.4 10/01/2021  HGB 13.0 10/01/2021   HCT 39.8 10/01/2021   MCV 93.9 10/01/2021   PLT 255.0 10/01/2021    BMET    Component Value Date/Time   NA 140 10/11/2021 1530   K 3.6 10/11/2021 1530   CL 108 10/11/2021 1530   CO2 24 10/11/2021 1530   GLUCOSE 93 10/11/2021 1530   BUN 26 (H) 10/11/2021 1530   CREATININE 0.80 10/11/2021 1530   CREATININE 0.77 09/25/2016 1544   CALCIUM 9.5 10/11/2021 1530   GFRNONAA >60 07/06/2020 0510   GFRAA >60 06/24/2018 2000   CrCl cannot be calculated (Patient's most recent lab result is older than the maximum 21 days allowed.).  COAG No results found for: "INR", "PROTIME"  Radiology No results found.   Assessment/Plan There are no diagnoses linked to this encounter.   Levora Dredge, MD  05/04/2022 8:18 PM

## 2022-05-05 ENCOUNTER — Encounter (INDEPENDENT_AMBULATORY_CARE_PROVIDER_SITE_OTHER): Payer: Self-pay

## 2022-05-05 ENCOUNTER — Encounter (INDEPENDENT_AMBULATORY_CARE_PROVIDER_SITE_OTHER): Payer: Self-pay | Admitting: Vascular Surgery

## 2022-05-05 DIAGNOSIS — I739 Peripheral vascular disease, unspecified: Secondary | ICD-10-CM

## 2022-05-05 DIAGNOSIS — M2550 Pain in unspecified joint: Secondary | ICD-10-CM

## 2022-05-05 DIAGNOSIS — K219 Gastro-esophageal reflux disease without esophagitis: Secondary | ICD-10-CM

## 2022-06-09 ENCOUNTER — Encounter (INDEPENDENT_AMBULATORY_CARE_PROVIDER_SITE_OTHER): Payer: Self-pay

## 2022-06-17 ENCOUNTER — Other Ambulatory Visit: Payer: Self-pay | Admitting: Student

## 2022-06-17 DIAGNOSIS — M5417 Radiculopathy, lumbosacral region: Secondary | ICD-10-CM

## 2022-06-23 ENCOUNTER — Encounter: Payer: Self-pay | Admitting: Family Medicine

## 2022-06-23 ENCOUNTER — Ambulatory Visit (INDEPENDENT_AMBULATORY_CARE_PROVIDER_SITE_OTHER): Payer: Commercial Managed Care - HMO | Admitting: Family Medicine

## 2022-06-23 ENCOUNTER — Telehealth: Payer: Self-pay

## 2022-06-23 VITALS — BP 130/82 | HR 78 | Temp 97.9°F | Resp 16 | Ht 62.0 in | Wt 137.4 lb

## 2022-06-23 DIAGNOSIS — M5410 Radiculopathy, site unspecified: Secondary | ICD-10-CM

## 2022-06-23 DIAGNOSIS — F418 Other specified anxiety disorders: Secondary | ICD-10-CM | POA: Diagnosis not present

## 2022-06-23 DIAGNOSIS — M7989 Other specified soft tissue disorders: Secondary | ICD-10-CM

## 2022-06-23 MED ORDER — SAVELLA TITRATION PACK 12.5 & 25 & 50 MG PO MISC: ORAL | 0 refills | Status: DC

## 2022-06-23 MED ORDER — SAVELLA 50 MG PO TABS: 50.0000 mg | ORAL_TABLET | Freq: Two times a day (BID) | ORAL | 3 refills | Status: DC

## 2022-06-23 MED ORDER — DULOXETINE HCL 20 MG PO CPEP: 20.0000 mg | ORAL_CAPSULE | Freq: Every day | ORAL | 3 refills | Status: DC

## 2022-06-23 MED ORDER — PREDNISONE 10 MG PO TABS: ORAL_TABLET | ORAL | 0 refills | Status: DC

## 2022-06-23 NOTE — Progress Notes (Signed)
   Subjective:    Patient ID: Martha Moss, female    DOB: 10/15/1965, 56 y.o.   MRN: [id number]  HPI Knots on arm- pt reports one has been present 'for years and years' and she has had a few just 'pop up' recently.  Some of the lumps will get painful.  Pt reports similar lumps 'all over body'.  Back pain- pt has been seeing neuro, MRI is scheduled.  Pt reports pain is so severe she is thinking about 'not waking up'.  Pain starts in back and radiates down into hip and leg.  Pt has been on Cymbalta and Savella.  No relief w/ Cymbalta, did well on Savella.     Review of Systems For ROS see HPI     Objective:   Physical Exam Vitals reviewed.  Constitutional:      General: She is not in acute distress.    Appearance: Normal appearance. She is not ill-appearing.  HENT:     Head: Normocephalic and atraumatic.  Musculoskeletal:        General: Deformity (multiple subcutaneous soft tissue masses in R forearm, freely mobile, minimal TTP) present.  Skin:    General: Skin is warm and dry.  Neurological:     General: No focal deficit present.     Mental Status: She is alert and oriented to person, place, and time.  Psychiatric:     Comments: Tearful, overwhelmed           Assessment & Plan:   Soft tissue masses- new.  Most likely lipomas.  Pt had recent brain MRI that was WNL and did not show evidence of neurofibromas.  Given that she is developing additional masses and some of them are tender, will get [place] to assess.  Pt expressed understanding and is in agreement w/ plan.   Depression/anxiety- deteriorated.  Pt reports that due to her current level of pain she has had thoughts of not waking up.  She reports that she is 'tired of being in pain'.  Has MRI scheduled for back but not currently being treated for pain.  Attempted to start [facility] but pharmacy told her this is not available for at least a month.  Will start low dose Cymbalta.  Pt expressed understanding and is in  agreement w/ plan.   Radicular back pain- pt has MRI scheduled.  Will start Prednisone for pain relief.  Pt appreciative of medication.

## 2022-06-23 NOTE — Telephone Encounter (Signed)
Called and made patient aware, she was appreciative

## 2022-06-23 NOTE — Telephone Encounter (Signed)
Patient called in and reports the savella will not be available for at least 30 days at her pharmacy.   Please advise

## 2022-06-23 NOTE — Patient Instructions (Addendum)
Follow up in 4-6 weeks to recheck pain and mood We'll call you to schedule your ultrasound and determine the next steps TAKE the Prednisone as directed to help the back pain.  Take w/ food START the Savella starting pack to titrate up to the recommended dose of 50mg  twice daily Once the starter pack is complete, start the 50mg  twice daily Call with any questions or concerns Hang in there!!!

## 2022-06-23 NOTE — Telephone Encounter (Signed)
Will start w/ Cymbalta rather than wait on Savella.  Will start low dose (20mg ) daily.  Prescription sent to pharmacy

## 2022-06-23 NOTE — Addendum Note (Signed)
Addended by: Sheliah Hatch on: 06/23/2022 01:15 PM   Modules accepted: Orders

## 2022-06-24 ENCOUNTER — Ambulatory Visit
Admission: RE | Admit: 2022-06-24 | Discharge: 2022-06-24 | Disposition: A | Discharge status: Home / Self Care | Payer: Commercial Managed Care - HMO | Source: Ambulatory Visit | Attending: Family Medicine | Admitting: Family Medicine

## 2022-06-24 DIAGNOSIS — M7989 Other specified soft tissue disorders: Secondary | ICD-10-CM

## 2022-06-26 ENCOUNTER — Telehealth: Payer: Self-pay

## 2022-06-26 NOTE — Telephone Encounter (Signed)
-----   Message from Sheliah Hatch, MD sent at 06/25/2022  8:54 PM EST ----- The areas in your arm are consistent w/ fatty lumps- called lipomas.  Thankfully these are benign and don't require any treatment unless they become more painful/bothersome

## 2022-06-26 NOTE — Telephone Encounter (Signed)
Patient called back and was informed.

## 2022-07-10 ENCOUNTER — Ambulatory Visit
Admission: RE | Admit: 2022-07-10 | Discharge: 2022-07-10 | Disposition: A | Discharge status: Home / Self Care | Payer: Commercial Managed Care - HMO | Source: Ambulatory Visit | Attending: Student | Admitting: Student

## 2022-07-10 DIAGNOSIS — M5417 Radiculopathy, lumbosacral region: Secondary | ICD-10-CM

## 2022-07-10 MED ORDER — GADOBENATE DIMEGLUMINE 529 MG/ML IV SOLN
12.0000 mL | Freq: Once | INTRAVENOUS | Status: AC | PRN
  Administered 2022-07-10: 12 mL via INTRAVENOUS

## 2022-07-25 ENCOUNTER — Ambulatory Visit
Admission: EM | Admit: 2022-07-25 | Discharge: 2022-07-25 | Disposition: A | Discharge status: Home / Self Care | Payer: Commercial Managed Care - HMO | Attending: Internal Medicine | Admitting: Internal Medicine

## 2022-07-25 ENCOUNTER — Encounter: Payer: Self-pay | Admitting: Emergency Medicine

## 2022-07-25 DIAGNOSIS — R300 Dysuria: Secondary | ICD-10-CM

## 2022-07-25 DIAGNOSIS — N3001 Acute cystitis with hematuria: Secondary | ICD-10-CM

## 2022-07-25 DIAGNOSIS — R350 Frequency of micturition: Secondary | ICD-10-CM | POA: Insufficient documentation

## 2022-07-25 LAB — POCT URINALYSIS DIP (MANUAL ENTRY)
Bilirubin, UA: NEGATIVE
Glucose, UA: 100 mg/dL — AB
Ketones, POC UA: NEGATIVE mg/dL
Nitrite, UA: POSITIVE — AB
Spec Grav, UA: <=1.005 — AB (ref 1.010–1.025)
Urobilinogen, UA: 1 E.U./dL
pH, UA: 5 (ref 5.0–8.0)

## 2022-07-25 MED ORDER — NITROFURANTOIN MONOHYD MACRO 100 MG PO CAPS: 100.0000 mg | ORAL_CAPSULE | Freq: Two times a day (BID) | ORAL | 0 refills | Status: DC

## 2022-07-25 NOTE — Discharge Instructions (Signed)
It appears that you have a urinary tract infection so I am treating this with an antibiotic.  Urine culture is pending.  We will call when it results.  Please follow-up if any symptoms persist or worsen.

## 2022-07-25 NOTE — ED Triage Notes (Signed)
Pt is present today with c/o dysuria, urinary frequency, and back pain.   Onset~x2 days

## 2022-07-25 NOTE — ED Provider Notes (Signed)
[facility]    CSN: [id number] Arrival date & time: 07/25/22  1457      History   Chief Complaint Chief Complaint  Patient presents with   Dysuria   Back Pain    HPI Martha Moss is a 56 y.o. female.   Patient presents with urinary burning, urinary frequency and urgency, left lower back pain that started 2 days ago.  Patient denies any associated fever, vaginal discharge, hematuria, abdominal pain.  Patient reports history of multiple urinary tract infections per year.  She is followed by urology and has history of interstitial cystitis.  She originally thought the symptoms were related to interstitial cystitis but states they were more persistent so now she is concerned about a UTI.  She has been taking Azo with minimal improvement.   Dysuria Back Pain   Past Medical History:  Diagnosis Date   Depression    Fibromyalgia    GERD (gastroesophageal reflux disease)    Migraine    PONV (postoperative nausea and vomiting)     Patient Active Problem List   Diagnosis Date Noted   PAD (peripheral artery disease) (HCC) 05/04/2022   Esophageal dysphagia    Right upper quadrant abdominal pain    Nausea without vomiting    Seizure-like activity (HCC) 07/03/2020   Psychogenic nonepileptic seizure 07/03/2020   Seizure (HCC) 07/03/2020   Hypotension 07/03/2020   Altered mental status    Neuropathy of both feet 12/22/2017   Physical exam 09/25/2016   Chronic idiopathic constipation 10/13/2014   Insomnia 06/22/2013   Pain in right wrist 06/22/2013   Soft tissue mass 06/22/2013   Plantar fasciitis of left foot 03/14/2013   Shaky 02/22/2013   Depression with anxiety 10/28/2010   Migraine without aura 10/28/2010   GERD 10/28/2010   CHEST PAIN, ATYPICAL, HX OF 03/27/2010   ALLERGIC RHINITIS, SEASONAL 12/07/2009   Fibromyalgia 10/26/2009   POLYARTHRITIS 10/19/2009    Past Surgical History:  Procedure Laterality Date   BIOPSY  07/06/2020   Procedure:  BIOPSY;  Surgeon: Shellia Cleverlyirigliano, Vito V, DO;  Location: [facility];  Service: Gastroenterology;;   CHOLECYSTECTOMY     ESOPHAGOGASTRODUODENOSCOPY Left 07/06/2020   Procedure: ESOPHAGOGASTRODUODENOSCOPY (EGD);  Surgeon: Shellia Cleverlyirigliano, Vito V, DO;  [facility];  Service: Gastroenterology;  Laterality: Left;   ETHMOIDECTOMY Bilateral 09/09/2016   Procedure: ETHMOIDECTOMY;  Surgeon: Newman PiesSu Teoh, MD;  Location: [facility];  Service: ENT;  Laterality: Bilateral;   FRONTAL SINUS EXPLORATION Bilateral 09/09/2016   Procedure: FRONTAL RECESS SINUS EXPLORATION;  Surgeon: Newman PiesSu Teoh, MD;  Location: [facility];  Service: ENT;  Laterality: Bilateral;   MALONEY DILATION  07/06/2020   Procedure: Elease HashimotoMALONEY DILATION;  Surgeon: Shellia Cleverlyirigliano, Vito V, DO;  Location: [facility];  Service: Gastroenterology;;   MAXILLARY ANTROSTOMY Bilateral 09/09/2016   Procedure: MAXILLARY ANTROSTOMY WITH TISSUE REMOVAL;  Surgeon: Newman PiesSu Teoh, MD;  Location: [facility];  Service: ENT;  Laterality: Bilateral;   OVARIAN CYST SURGERY     SINUS ENDO W/FUSION Bilateral 09/09/2016   Procedure: ENDOSCOPIC SINUS SURGERY WITH NAVIGATION;  Surgeon: Newman PiesSu Teoh, MD;  Location: [facility];  Service: ENT;  Laterality: Bilateral;   TOTAL VAGINAL HYSTERECTOMY     TURBINATE REDUCTION Bilateral 09/09/2016   Procedure: BILATERAL TURBINATE REDUCTION;  Surgeon: Newman PiesSu Teoh, MD;  Location:  SURGERY CENTER;  Service: ENT;  Laterality: Bilateral;    OB History     Gravida  3   Para  3   Term  3   Preterm      AB      Living  3      SAB      IAB      Ectopic      Multiple      Live Births  3            Home Medications    Prior to Admission medications   Medication Sig Start Date End Date Taking? Authorizing Provider  nitrofurantoin, macrocrystal-monohydrate, (MACROBID) 100 MG capsule Take 1 capsule (100 mg total) by mouth 2 (two) times daily. 07/25/22  Yes Gustavus Bryant,  FNP  aspirin 81 MG chewable tablet Chew 81 mg by mouth daily.    [provider]  cyclobenzaprine (FLEXERIL) 10 MG tablet Take 1 tablet (10 mg total) by mouth 3 (three) times daily as needed for muscle spasms. 08/06/21   Sheliah Hatch, MD  DULoxetine (CYMBALTA) 20 MG capsule Take 1 capsule (20 mg total) by mouth daily. 06/23/22   Sheliah Hatch, MD  Famotidine (PEPCID PO) Take by mouth at bedtime. Taking once daily, patient does not recall the dose    [provider]  fexofenadine (ALLEGRA) 180 MG tablet Take 180 mg by mouth as directed. Monday Wednesday and Friday    [provider]  levocetirizine (XYZAL) 5 MG tablet Take 5 mg by mouth as directed. Tuesday Thursday Saturday and Sunday    [provider]  magnesium 30 MG tablet Take 30 mg by mouth every other day.    [provider]  Milnacipran (SAVELLA) 50 MG TABS tablet Take 1 tablet (50 mg total) by mouth 2 (two) times daily. 06/23/22   Sheliah Hatch, MD  Milnacipran HCl [facility] TITRATION PACK) 12.5 & 25 & 50 MG MISC Take as directed 06/23/22   Sheliah Hatch, MD  Multiple Vitamin (MULTIVITAMIN WITH MINERALS) TABS tablet Take 1 tablet by mouth daily.    [provider]  pantoprazole (PROTONIX) 40 MG tablet TAKE 1 TABLET BY MOUTH TWICE DAILY FOR 30 DAYS, THEN 1 TABLET DAILY 12/30/21   Cirigliano, Vito V, DO  predniSONE (DELTASONE) 10 MG tablet 3 tabs x3 days and then 2 tabs x3 days and then 1 tab x3 days.  Take w/ food. 06/23/22   Sheliah Hatch, MD  Topiramate ER (TROKENDI XR) 100 MG CP24 Take 1 capsule by mouth daily.    [provider]  vitamin E 1000 UNIT capsule Take 1,000 Units by mouth daily.    [provider]    Family History Family History  Problem Relation Age of Onset   Kidney Stones Daughter    Migraines Mother    Heart attack Father 55   Diabetes Maternal Grandmother    Stroke Sister    Migraines Brother    Stroke Brother     Heart disease Brother    Migraines Other        many family members on maternal side    Colon cancer Neg Hx    Pancreatic cancer Neg Hx    Esophageal cancer Neg Hx     Social History Social History   Tobacco Use   Smoking status: Never   Smokeless tobacco: Never  Vaping Use   Vaping Use: Never used  Substance Use Topics   Alcohol use: Yes    Comment: rarely   Drug use: No     Allergies   Bee venom, Azithromycin, Diphenhydramine hcl, Erythromycin, Latex, Morphine and related, Penicillins,  Percocet [oxycodone-acetaminophen], Sulfonamide derivatives, and Tetracyclines & related   Review of Systems Review of Systems Per HPI  Physical Exam Triage Vital Signs ED Triage Vitals [07/25/22 1626]  Enc Vitals Group     BP 104/71     Pulse Rate 88     Resp 18     Temp 98.7 F (37.1 C)     Temp src      SpO2 95 %     Weight      Height      Head Circumference      Peak Flow      Pain Score 6     Pain Loc      Pain Edu?      Excl. in GC?    No data found.  Updated Vital Signs BP 104/71   Pulse 88   Temp 98.7 F (37.1 C)   Resp 18   SpO2 95%   Visual Acuity Right Eye Distance:   Left Eye Distance:   Bilateral Distance:    Right Eye Near:   Left Eye Near:    Bilateral Near:     Physical Exam Constitutional:      General: She is not in acute distress.    Appearance: Normal appearance. She is not toxic-appearing or diaphoretic.  HENT:     Head: Normocephalic and atraumatic.  Eyes:     Extraocular Movements: Extraocular movements intact.     Conjunctiva/sclera: Conjunctivae normal.  Cardiovascular:     Rate and Rhythm: Normal rate and regular rhythm.     Pulses: Normal pulses.     Heart sounds: Normal heart sounds.  Pulmonary:     Effort: Pulmonary effort is normal. No respiratory distress.     Breath sounds: Normal breath sounds.  Abdominal:     General: Bowel sounds are normal. There is no distension.     Palpations: Abdomen is soft.      Tenderness: There is no abdominal tenderness.  Neurological:     General: No focal deficit present.     Mental Status: She is alert and oriented to person, place, and time. Mental status is at baseline.  Psychiatric:        Mood and Affect: Mood normal.        Behavior: Behavior normal.        Thought Content: Thought content normal.        Judgment: Judgment normal.      UC Treatments / Results  Labs (all labs ordered are listed, but only abnormal results are displayed) Labs Reviewed  POCT URINALYSIS DIP (MANUAL ENTRY) - Abnormal; Notable for the following components:      Result Value   Color, UA orange (*)    Glucose, UA =100 (*)    Spec Grav, UA <=1.005 (*)    Blood, UA trace-intact (*)    Protein Ur, POC trace (*)    Nitrite, UA Positive (*)    Leukocytes, UA Large (3+) (*)    All other components within normal limits  URINE CULTURE    EKG   Radiology No results found.  Procedures Procedures (including critical care time)  Medications Ordered in UC Medications - No data to display  Initial Impression / Assessment and Plan / UC Course  I have reviewed the triage vital signs and the nursing notes.  Pertinent labs & imaging results that were available during my care of the patient were reviewed by me and considered in my medical decision making (see  chart for details).     Urinalysis indicating urinary tract infection but could also be contaminated from Azo.  Given history of frequent urinary tract infections and associated symptoms, will opt to treat for UTI with Macrobid.  Will choose Macrobid given patient's allergy list and patient tolerating Macrobid well in the past for frequent urinary tract infections.  Urine culture pending.  Patient advised to follow-up if symptoms persist or worsen.  Patient verbalized understanding and was agreeable with plan. Final Clinical Impressions(s) / UC Diagnoses   Final diagnoses:  Acute cystitis with hematuria  Dysuria   Urinary frequency     Discharge Instructions      It appears that you have a urinary tract infection so I am treating this with an antibiotic.  Urine culture is pending.  We will call when it results.  Please follow-up if any symptoms persist or worsen.    ED Prescriptions     Medication Sig Dispense Auth. Provider   nitrofurantoin, macrocrystal-monohydrate, (MACROBID) 100 MG capsule Take 1 capsule (100 mg total) by mouth 2 (two) times daily. 10 capsule Gustavus Bryant, [place]      PDMP not reviewed this encounter.   Gustavus Bryant, [place] 07/25/22 [phone]

## 2022-07-26 LAB — URINE CULTURE: Culture: NO GROWTH

## 2022-09-14 DIAGNOSIS — M79606 Pain in leg, unspecified: Secondary | ICD-10-CM | POA: Insufficient documentation

## 2022-09-14 NOTE — Progress Notes (Unsigned)
MRN : [id number]  Martha Moss is a 57 y.o. ([birth year]) female who presents with chief complaint of check circulation.  History of Present Illness:   The patient is seen for evaluation of painful right lower extremity. Patient notes the pain is variable and is mostly but not always associated with activity.  The pain is somewhat consistent day to day occurring on most days. The patient notes the pain also occurs with standing and routinely seems worse as the day wears on. The pain has been progressive over the past several years.   The patient has a  history of back problems and DJD of the lumbar and sacral spine.   The patient denies rest pain or dangling of an extremity off the side of the bed during the night for relief. No open wounds or sores at this time. No history of DVT or phlebitis. No prior vascular interventions or surgeries.  ABI's are normal bilaterally  No outpatient medications have been marked as taking for the 09/15/22 encounter (Appointment) with Delana Meyer, Dolores Lory, MD.    Past Medical History:  Diagnosis Date   Depression    Fibromyalgia    GERD (gastroesophageal reflux disease)    Migraine    PONV (postoperative nausea and vomiting)     Past Surgical History:  Procedure Laterality Date   BIOPSY  07/06/2020   Procedure: BIOPSY;  Surgeon: Lavena Bullion, DO;  Location: [facility];  Service: Gastroenterology;;   CHOLECYSTECTOMY     ESOPHAGOGASTRODUODENOSCOPY Left 07/06/2020   Procedure: ESOPHAGOGASTRODUODENOSCOPY (EGD);  Surgeon: Lavena Bullion, DO;  [facility]: [facility];  Service: Gastroenterology;  Laterality: Left;   ETHMOIDECTOMY Bilateral 09/09/2016   Procedure: ETHMOIDECTOMY;  Surgeon: Leta Baptist, MD;  Location: [place];  Service: ENT;  Laterality: Bilateral;   FRONTAL SINUS EXPLORATION Bilateral 09/09/2016   Procedure: FRONTAL RECESS SINUS EXPLORATION;  Surgeon: Leta Baptist, MD;  Location: [place];  Service: ENT;  Laterality: Bilateral;   MALONEY DILATION  07/06/2020   Procedure: Venia Minks DILATION;  Surgeon: Lavena Bullion, DO;  Location: [facility];  Service: Gastroenterology;;   MAXILLARY ANTROSTOMY Bilateral 09/09/2016   Procedure: MAXILLARY ANTROSTOMY WITH TISSUE REMOVAL;  Surgeon: Leta Baptist, MD;  Location: [place];  Service: ENT;  Laterality: Bilateral;   OVARIAN CYST SURGERY     SINUS ENDO W/FUSION Bilateral 09/09/2016   Procedure: ENDOSCOPIC SINUS SURGERY WITH NAVIGATION;  Surgeon: Leta Baptist, MD;  Location: [place];  Service: ENT;  Laterality: Bilateral;   TOTAL VAGINAL HYSTERECTOMY     TURBINATE REDUCTION Bilateral 09/09/2016   Procedure: BILATERAL TURBINATE REDUCTION;  Surgeon: Leta Baptist, MD;  Location: [place];  Service: ENT;  Laterality: Bilateral;    Social History Social History   Tobacco Use   Smoking status: Never   Smokeless tobacco: Never  Vaping Use   Vaping Use: Never used  Substance Use Topics   Alcohol use: Yes    Comment: rarely   Drug use: No    Family History Family History  Problem Relation Age of Onset   Kidney Stones Daughter    Migraines Mother    Heart attack Father 44   Diabetes Maternal Grandmother    Stroke Sister    Migraines Brother    Stroke Brother    Heart disease Brother    Migraines Other  many family members on maternal side    Colon cancer Neg Hx    Pancreatic cancer Neg Hx    Esophageal cancer Neg Hx     Allergies  Allergen Reactions   Bee Venom    Azithromycin     REACTION: hives   Diphenhydramine Hcl     REACTION: hives   Erythromycin     REACTION: hives   Latex     REACTION: rash   Morphine And Related Nausea And Vomiting   Penicillins     REACTION: rash, fever   Percocet [Oxycodone-Acetaminophen] Other (See Comments)    Hallucinations    Sulfonamide Derivatives     REACTION: rash, fever   Tetracyclines & Related      REVIEW OF  SYSTEMS (Negative unless checked)  Constitutional: [] Weight loss  [] Fever  [] Chills Cardiac: [] Chest pain   [] Chest pressure   [] Palpitations   [] Shortness of breath when laying flat   [] Shortness of breath with exertion. Vascular:  [x] Pain in legs with walking   [] Pain in legs at rest  [] History of DVT   [] Phlebitis   [] Swelling in legs   [] Varicose veins   [] Non-healing ulcers Pulmonary:   [] Uses home oxygen   [] Productive cough   [] Hemoptysis   [] Wheeze  [] COPD   [] Asthma Neurologic:  [] Dizziness   [] Seizures   [] History of stroke   [] History of TIA  [] Aphasia   [] Vissual changes   [] Weakness or numbness in arm   [] Weakness or numbness in leg Musculoskeletal:   [] Joint swelling   [x] Joint pain   [x] Low back pain Hematologic:  [] Easy bruising  [] Easy bleeding   [] Hypercoagulable state   [] Anemic Gastrointestinal:  [] Diarrhea   [] Vomiting  [x] Gastroesophageal reflux/heartburn   [] Difficulty swallowing. Genitourinary:  [] Chronic kidney disease   [] Difficult urination  [] Frequent urination   [] Blood in urine Skin:  [] Rashes   [] Ulcers  Psychological:  [] History of anxiety   []  History of major depression.  Physical Examination  There were no vitals filed for this visit. There is no height or weight on file to calculate BMI. Gen: WD/WN, NAD Head: [place]/AT, No temporalis wasting.  Ear/Nose/Throat: Hearing grossly intact, nares w/o erythema or drainage Eyes: PER, EOMI, sclera nonicteric.  Neck: Supple, no masses.  No bruit or JVD.  Pulmonary:  Good air movement, no audible wheezing, no use of accessory muscles.  Cardiac: RRR, normal S1, S2, no Murmurs. Vascular:  mild trophic changes, no open wounds Vessel Right Left  Radial Palpable Palpable  Gastrointestinal: soft, non-distended. No guarding/no peritoneal signs.  Musculoskeletal: M/S 5/5 throughout.  No visible deformity.  Neurologic: CN 2-12 intact. Pain and light touch intact in extremities.  Symmetrical.  Speech is fluent. Motor exam as  listed above. Psychiatric: Judgment intact, Mood & affect appropriate for pt's clinical situation. Dermatologic: No rashes or ulcers noted.  No changes consistent with cellulitis.   CBC Lab Results  Component Value Date   WBC 5.4 10/01/2021   HGB 13.0 10/01/2021   HCT 39.8 10/01/2021   MCV 93.9 10/01/2021   PLT 255.0 10/01/2021    BMET    Component Value Date/Time   NA 140 10/11/2021 1530   K 3.6 10/11/2021 1530   CL 108 10/11/2021 1530   CO2 24 10/11/2021 1530   GLUCOSE 93 10/11/2021 1530   BUN 26 (H) 10/11/2021 1530   CREATININE 0.80 10/11/2021 1530   CREATININE 0.77 09/25/2016 1544   CALCIUM 9.5 10/11/2021 1530   GFRNONAA >60 07/06/2020 0510  GFRAA >60 06/24/2018 2000   CrCl cannot be calculated (Patient's most recent lab result is older than the maximum 21 days allowed.).  COAG No results found for: "INR", "PROTIME"  Radiology No results found.   Assessment/Plan 1. Pain in both lower extremities Recommend:  The patient has atypical pain symptoms for vascular disease and on exam I do not find evidence of vascular pathology that would explain the patient's symptoms.  Noninvasive studies do not identify significant vascular problems  I suspect the patient is c/o pseudoclaudication.  Patient should have an evaluation of the LS spine which I defer to the primary service or the Spine service.  The patient should continue walking and begin a more formal exercise program. The patient should continue his antiplatelet therapy and aggressive treatment of the lipid abnormalities.  Patient will follow-up with me on a PRN basis.  2. DJD (degenerative joint disease), lumbosacral Recommend:  The patient has atypical pain symptoms for vascular disease and on exam I do not find evidence of vascular pathology that would explain the patient's symptoms.  Noninvasive studies do not identify significant vascular problems  I suspect the patient is c/o pseudoclaudication.   Patient should have an evaluation of the LS spine which I defer to the primary service or the Spine service.  The patient should continue walking and begin a more formal exercise program. The patient should continue his antiplatelet therapy and aggressive treatment of the lipid abnormalities.  Patient will follow-up with me on a PRN basis.  3. Gastroesophageal reflux disease, unspecified whether esophagitis present Continue PPI as already ordered, this medication has been reviewed and there are no changes at this time.  Avoidence of caffeine and alcohol  Moderate elevation of the head of the bed   4. Right leg numbness See #1 - VAS [place] ABI WITH/WO TBI    Hortencia Pilar, MD  09/14/2022 4:51 PM

## 2022-09-15 ENCOUNTER — Encounter (INDEPENDENT_AMBULATORY_CARE_PROVIDER_SITE_OTHER): Payer: Self-pay | Admitting: Vascular Surgery

## 2022-09-15 ENCOUNTER — Ambulatory Visit (INDEPENDENT_AMBULATORY_CARE_PROVIDER_SITE_OTHER): Payer: Commercial Managed Care - HMO | Admitting: Vascular Surgery

## 2022-09-15 ENCOUNTER — Ambulatory Visit (INDEPENDENT_AMBULATORY_CARE_PROVIDER_SITE_OTHER): Payer: Commercial Managed Care - HMO

## 2022-09-15 VITALS — BP 121/64 | HR 99 | Ht 62.0 in | Wt 137.0 lb

## 2022-09-15 DIAGNOSIS — M47817 Spondylosis without myelopathy or radiculopathy, lumbosacral region: Secondary | ICD-10-CM | POA: Diagnosis not present

## 2022-09-15 DIAGNOSIS — R200 Anesthesia of skin: Secondary | ICD-10-CM

## 2022-09-15 DIAGNOSIS — K219 Gastro-esophageal reflux disease without esophagitis: Secondary | ICD-10-CM

## 2022-09-15 DIAGNOSIS — M79605 Pain in left leg: Secondary | ICD-10-CM

## 2022-09-15 DIAGNOSIS — M79604 Pain in right leg: Secondary | ICD-10-CM | POA: Diagnosis not present

## 2022-09-15 DIAGNOSIS — I739 Peripheral vascular disease, unspecified: Secondary | ICD-10-CM

## 2022-10-17 ENCOUNTER — Other Ambulatory Visit: Payer: Self-pay | Admitting: Family Medicine

## 2022-10-17 ENCOUNTER — Other Ambulatory Visit: Payer: Self-pay

## 2022-10-17 MED ORDER — DULOXETINE HCL 20 MG PO CPEP: 20.0000 mg | ORAL_CAPSULE | Freq: Every day | ORAL | 3 refills | Status: DC

## 2022-10-17 NOTE — Telephone Encounter (Signed)
Cymbalta has been sent in . Notified pt that she will need an apt .

## 2022-10-17 NOTE — Telephone Encounter (Signed)
Encourage patient to contact the pharmacy for refills or they can request refills through [facility]  (Please schedule appointment if patient has not been seen in over a year)    WHAT PHARMACY WOULD THEY LIKE THIS SENT TO: [place], [place] NAME & DOSE: DULoxetine (CYMBALTA) 20 MG capsule  SAVELLA 100 MG TABS tablet [id number]   NOTES/COMMENTS FROM PATIENT: Pt states Cymbalta is having no effect and would like to be put back on Savella.      [place] office please notify patient: It takes 48-72 hours to process rx refill requests Ask patient to call pharmacy to ensure rx is ready before heading there.

## 2022-10-17 NOTE — Telephone Encounter (Signed)
Ok to refill the Cymbalta- this is not controlled and you can send this in.  As for the [place] (Milnacipran) she has not been on this since 2017 (at least according to our records).  We would need to know if she has been taking it this whole time and if so, who has been prescribing it.  If she has not been taking, we need an OV to discuss medication restart

## 2022-10-17 NOTE — Telephone Encounter (Signed)
I spoke to the pt and she was seen in our office on 06/23/22 and she was given a Rx for the starter pack of Savella the pharmacy could not get the Rx in Nov and we gave pt Cymbalta . She states she is not seeing any difference with the Cymbalta and wanted to know if she can take Savella 50 mg ( as that was directed at her visit 06/23/22 after completing the starter pack ) since the pharmacy can not get the starter pack . She states she has taken this in the past and did great on it

## 2022-10-17 NOTE — Telephone Encounter (Signed)
Patient is requesting a refill of the following medications: Requested Prescriptions   Pending Prescriptions Disp Refills   DULoxetine (CYMBALTA) 20 MG capsule 30 capsule 3    Sig: Take 1 capsule (20 mg total) by mouth daily.   Milnacipran HCl (SAVELLA TITRATION PACK) 12.5 & 25 & 50 MG MISC 55 each 0    Sig: Take as directed   Milnacipran (SAVELLA) 50 MG TABS tablet 60 tablet     Sig: Take 1 tablet (50 mg total) by mouth 2 (two) times daily.    Date of patient request: 10/17/2022 Last office visit: 06/23/2022 Date of last refill: 09/15/2022, 06/23/2022 Last refill amount: 30, 55

## 2022-10-20 MED ORDER — SAVELLA 50 MG PO TABS: 50.0000 mg | ORAL_TABLET | Freq: Two times a day (BID) | ORAL | 6 refills | Status: DC

## 2022-10-20 NOTE — Telephone Encounter (Signed)
Informed pt  that we sent in [facility] and she will need to stop the Cymbalta

## 2022-10-20 NOTE — Addendum Note (Signed)
Addended by: Midge Minium on: 10/20/2022 07:26 AM   Modules accepted: Orders

## 2022-10-20 NOTE — Telephone Encounter (Signed)
Prescription for Martha Moss was sent to pharmacy.  She needs to STOP her Cymbalta when she starts this medication

## 2022-11-04 ENCOUNTER — Other Ambulatory Visit: Payer: Self-pay

## 2022-11-04 ENCOUNTER — Encounter: Payer: Self-pay | Admitting: Emergency Medicine

## 2022-11-04 ENCOUNTER — Ambulatory Visit
Admission: EM | Admit: 2022-11-04 | Discharge: 2022-11-04 | Disposition: A | Discharge status: Home / Self Care | Payer: Commercial Managed Care - HMO | Attending: Internal Medicine | Admitting: Internal Medicine

## 2022-11-04 DIAGNOSIS — N3001 Acute cystitis with hematuria: Secondary | ICD-10-CM | POA: Diagnosis not present

## 2022-11-04 DIAGNOSIS — R350 Frequency of micturition: Secondary | ICD-10-CM | POA: Diagnosis not present

## 2022-11-04 DIAGNOSIS — R300 Dysuria: Secondary | ICD-10-CM | POA: Insufficient documentation

## 2022-11-04 LAB — POCT URINALYSIS DIP (MANUAL ENTRY)
Bilirubin, UA: NEGATIVE
Glucose, UA: 100 mg/dL — AB
Ketones, POC UA: NEGATIVE mg/dL
Nitrite, UA: POSITIVE — AB
Protein Ur, POC: 100 mg/dL — AB
Spec Grav, UA: 1.015 (ref 1.010–1.025)
Urobilinogen, UA: 2 E.U./dL — AB
pH, UA: 5.5 (ref 5.0–8.0)

## 2022-11-04 MED ORDER — NITROFURANTOIN MONOHYD MACRO 100 MG PO CAPS: 100.0000 mg | ORAL_CAPSULE | Freq: Two times a day (BID) | ORAL | 0 refills | Status: DC

## 2022-11-04 NOTE — ED Triage Notes (Signed)
Pt here dysuria with hx of UTI; pt sts sx x 3 days

## 2022-11-04 NOTE — ED Provider Notes (Signed)
[facility]    CSN: [id number] Arrival date & time: 11/04/22  0807      History   Chief Complaint Chief Complaint  Patient presents with   Dysuria    HPI Martha Moss is a 57 y.o. female.   Patient presents with urinary burning and urinary frequency that started about 3 days ago.  She reports some mild lower abdominal pain and right lower back pain that is associated with it as well.  Denies fever, vaginal discharge, abnormal vaginal bleeding.  Patient no longer has menstrual cycles.  She reports that she has a history of frequent urinary tract infections as well as interstitial cystitis.  She used to see urology but has not seen them in "a while".  She does not take any maintenance medications for frequent UTIs or interstitial cystitis.   Dysuria   Past Medical History:  Diagnosis Date   Depression    Fibromyalgia    GERD (gastroesophageal reflux disease)    Migraine    PONV (postoperative nausea and vomiting)     Patient Active Problem List   Diagnosis Date Noted   DJD (degenerative joint disease), lumbosacral 09/15/2022   Leg pain 09/14/2022   PAD (peripheral artery disease) ([place]) 05/04/2022   Esophageal dysphagia    Right upper quadrant abdominal pain    Nausea without vomiting    Seizure-like activity ([place]) 07/03/2020   Psychogenic nonepileptic seizure 07/03/2020   Seizure ([place]) 07/03/2020   Hypotension 07/03/2020   Altered mental status    Neuropathy of both feet 12/22/2017   Physical exam 09/25/2016   Chronic idiopathic constipation 10/13/2014   Insomnia 06/22/2013   Pain in right wrist 06/22/2013   Soft tissue mass 06/22/2013   Plantar fasciitis of left foot 03/14/2013   Shaky 02/22/2013   Depression with anxiety 10/28/2010   Migraine without aura 10/28/2010   GERD 10/28/2010   CHEST PAIN, ATYPICAL, HX OF 03/27/2010   ALLERGIC RHINITIS, SEASONAL 12/07/2009   Fibromyalgia 10/26/2009   POLYARTHRITIS 10/19/2009    Past Surgical  History:  Procedure Laterality Date   BIOPSY  07/06/2020   Procedure: BIOPSY;  Surgeon: Lavena Bullion, DO;  Location: [facility];  Service: Gastroenterology;;   CHOLECYSTECTOMY     ESOPHAGOGASTRODUODENOSCOPY Left 07/06/2020   Procedure: ESOPHAGOGASTRODUODENOSCOPY (EGD);  Surgeon: Lavena Bullion, DO;  Location: [facility];  Service: Gastroenterology;  Laterality: Left;   ETHMOIDECTOMY Bilateral 09/09/2016   Procedure: ETHMOIDECTOMY;  Surgeon: Leta Baptist, MD;  Location: [place];  Service: ENT;  Laterality: Bilateral;   FRONTAL SINUS EXPLORATION Bilateral 09/09/2016   Procedure: FRONTAL RECESS SINUS EXPLORATION;  Surgeon: Leta Baptist, MD;  Location: [place];  Service: ENT;  Laterality: Bilateral;   MALONEY DILATION  07/06/2020   Procedure: Venia Minks DILATION;  Surgeon: Lavena Bullion, DO;  Location: [facility];  Service: Gastroenterology;;   MAXILLARY ANTROSTOMY Bilateral 09/09/2016   Procedure: MAXILLARY ANTROSTOMY WITH TISSUE REMOVAL;  Surgeon: Leta Baptist, MD;  Location: [place];  Service: ENT;  Laterality: Bilateral;   OVARIAN CYST SURGERY     SINUS ENDO W/FUSION Bilateral 09/09/2016   Procedure: ENDOSCOPIC SINUS SURGERY WITH NAVIGATION;  Surgeon: Leta Baptist, MD;  Location: [place];  Service: ENT;  Laterality: Bilateral;   TOTAL VAGINAL HYSTERECTOMY     TURBINATE REDUCTION Bilateral 09/09/2016   Procedure: BILATERAL TURBINATE REDUCTION;  Surgeon: Leta Baptist, MD;  Location: [place];  Service: ENT;  Laterality: Bilateral;    OB  History     Gravida  3   Para  3   Term  3   Preterm      AB      Living  3      SAB      IAB      Ectopic      Multiple      Live Births  3            Home Medications    Prior to Admission medications   Medication Sig Start Date End Date Taking? Authorizing Provider  nitrofurantoin, macrocrystal-monohydrate, (MACROBID) 100 MG capsule Take 1 capsule  (100 mg total) by mouth 2 (two) times daily. 11/04/22  Yes Teodora Medici, FNP  aspirin 81 MG chewable tablet Chew 81 mg by mouth daily.    [provider]  Famotidine (PEPCID PO) Take by mouth at bedtime. Taking once daily, patient does not recall the dose    [provider]  fexofenadine (ALLEGRA) 180 MG tablet Take 180 mg by mouth as directed. Monday Wednesday and Friday    [provider]  levocetirizine (XYZAL) 5 MG tablet Take 5 mg by mouth as directed. Tuesday Thursday Saturday and Sunday    [provider]  magnesium 30 MG tablet Take 30 mg by mouth every other day.    [provider]  Milnacipran (SAVELLA) 50 MG TABS tablet Take 1 tablet (50 mg total) by mouth 2 (two) times daily. 10/20/22   Midge Minium, MD  pantoprazole (PROTONIX) 40 MG tablet TAKE 1 TABLET BY MOUTH TWICE DAILY FOR 30 DAYS, THEN 1 TABLET DAILY 12/30/21   Cirigliano, Vito V, DO  Topiramate ER (TROKENDI XR) 100 MG CP24 Take 1 capsule by mouth in the morning and at bedtime.    [provider]  VITAMIN D PO Take by mouth.    [provider]  vitamin E 1000 UNIT capsule Take 1,000 Units by mouth daily.    [provider]    Family History Family History  Problem Relation Age of Onset   Kidney Stones Daughter    Migraines Mother    Heart attack Father 48   Diabetes Maternal Grandmother    Stroke Sister    Migraines Brother    Stroke Brother    Heart disease Brother    Migraines Other        many family members on maternal side    Colon cancer Neg Hx    Pancreatic cancer Neg Hx    Esophageal cancer Neg Hx     Social History Social History   Tobacco Use   Smoking status: Never   Smokeless tobacco: Never  Vaping Use   Vaping Use: Never used  Substance Use Topics   Alcohol use: Yes    Comment: rarely   Drug use: No     Allergies   Bee venom, Azithromycin, Diphenhydramine hcl, Erythromycin, Latex, Morphine and related,  Penicillins, Percocet [oxycodone-acetaminophen], Sulfonamide derivatives, and Tetracyclines & related   Review of Systems Review of Systems Per HPI  Physical Exam Triage Vital Signs ED Triage Vitals  Enc Vitals Group     BP 11/04/22 0848 109/76     Pulse Rate 11/04/22 0848 95     Resp 11/04/22 0848 18     Temp 11/04/22 0848 98.3 F (36.8 C)     Temp Source 11/04/22 0848 Oral     SpO2 11/04/22 0848 95 %     Weight --  Height --      Head Circumference --      Peak Flow --      Pain Score 11/04/22 0849 3     Pain Loc --      Pain Edu? --      Excl. in [place]? --    No data found.  Updated Vital Signs BP 121/77 (BP Location: Left Arm)   Pulse (!) 101   Temp 98 F (36.7 C) (Oral)   Resp 20   SpO2 96%   Visual Acuity Right Eye Distance:   Left Eye Distance:   Bilateral Distance:    Right Eye Near:   Left Eye Near:    Bilateral Near:     Physical Exam Constitutional:      General: She is not in acute distress.    Appearance: Normal appearance. She is not toxic-appearing or diaphoretic.  HENT:     Head: Normocephalic and atraumatic.  Eyes:     Extraocular Movements: Extraocular movements intact.     Conjunctiva/sclera: Conjunctivae normal.  Pulmonary:     Effort: Pulmonary effort is normal.  Abdominal:     Tenderness: There is no right CVA tenderness or left CVA tenderness.  Neurological:     General: No focal deficit present.     Mental Status: She is alert and oriented to person, place, and time. Mental status is at baseline.  Psychiatric:        Mood and Affect: Mood normal.        Behavior: Behavior normal.        Thought Content: Thought content normal.        Judgment: Judgment normal.      UC Treatments / Results  Labs (all labs ordered are listed, but only abnormal results are displayed) Labs Reviewed  POCT URINALYSIS DIP (MANUAL ENTRY) - Abnormal; Notable for the following components:      Result Value   Color, UA orange (*)    Clarity,  UA cloudy (*)    Glucose, UA =100 (*)    Blood, UA trace-intact (*)    Protein Ur, POC =100 (*)    Urobilinogen, UA 2.0 (*)    Nitrite, UA Positive (*)    Leukocytes, UA Large (3+) (*)    All other components within normal limits  URINE CULTURE    EKG   Radiology No results found.  Procedures Procedures (including critical care time)  Medications Ordered in UC Medications - No data to display  Initial Impression / Assessment and Plan / UC Course  I have reviewed the triage vital signs and the nursing notes.  Pertinent labs & imaging results that were available during my care of the patient were reviewed by me and considered in my medical decision making (see chart for details).     Urinalysis indicative of urinary tract infection but patient did take Azo so this could be causing contamination.  Although, with associated symptoms will opt to treat with Macrobid antibiotic.  Urine culture is pending.  Patient advised of the importance of following up with urology as well.  She has an appointment with PCP today and reports that she will discuss this with PCP.  Advised strict follow-up precautions and supportive care.  Patient verbalized understanding and was agreeable with plan. Final Clinical Impressions(s) / UC Diagnoses   Final diagnoses:  Acute cystitis with hematuria  Dysuria  Urinary frequency     Discharge Instructions      I am treating you  for UTI with antibiotic. Follow up with urology. Urine culture is pending.     ED Prescriptions     Medication Sig Dispense Auth. Provider   nitrofurantoin, macrocrystal-monohydrate, (MACROBID) 100 MG capsule Take 1 capsule (100 mg total) by mouth 2 (two) times daily. 10 capsule Teodora Medici, [place]      PDMP not reviewed this encounter.   Teodora Medici, [place] 11/04/22 [phone]

## 2022-11-04 NOTE — Discharge Instructions (Addendum)
I am treating you for UTI with antibiotic. Follow up with urology. Urine culture is pending.

## 2022-11-06 LAB — URINE CULTURE: Culture: 50000 — AB

## 2022-11-10 MED ORDER — DULOXETINE HCL 20 MG PO CPEP: 20.0000 mg | ORAL_CAPSULE | Freq: Every day | ORAL | 3 refills | Status: DC

## 2022-11-10 NOTE — Telephone Encounter (Signed)
Prescription sent for Cymbalta since Savella was too expensive

## 2022-11-10 NOTE — Telephone Encounter (Signed)
Pt states she can not afford the Rx Savella it is 500 a month and insurance only covers 100. Her daughter works for a Dr and told her that Dr Randel Books could do a Peir to peir to get the cost done . I informed her that is not what a peir to peir does .  She is wanting to know if we can renew her Cymbalta Rx instead ?

## 2022-11-10 NOTE — Addendum Note (Signed)
Addended by: Midge Minium on: 11/10/2022 05:41 PM   Modules accepted: Orders

## 2022-11-10 NOTE — Telephone Encounter (Signed)
Patient called to get a refill on her Cymbalta. Patient is having trouble getting her [facility]

## 2022-11-11 ENCOUNTER — Telehealth: Payer: Self-pay | Admitting: Family Medicine

## 2022-11-11 ENCOUNTER — Other Ambulatory Visit: Payer: Self-pay

## 2022-11-11 NOTE — Telephone Encounter (Signed)
Spoke to the pt and she states the pharmacy states they does not make a generic for [place] . She is currently taking the Cymbalta . We are making her an apt

## 2022-11-11 NOTE — Telephone Encounter (Signed)
Caller name: NOELIE NUNNERY  On DPR?: Yes  Call back number: [phone] (mobile)  Provider they see: Midge Minium, MD  Reason for call: Pt states Cymbalta isn't working and Ocie [place] is too expensive and insurance won't cover. Pt states she's in pain.

## 2022-11-11 NOTE — Telephone Encounter (Signed)
I have not seen patient since November.  She needs to schedule an appt

## 2022-11-11 NOTE — Telephone Encounter (Signed)
Notified pt that the Rx has been sent in  

## 2022-11-24 ENCOUNTER — Ambulatory Visit (INDEPENDENT_AMBULATORY_CARE_PROVIDER_SITE_OTHER): Payer: Commercial Managed Care - HMO | Admitting: Family Medicine

## 2022-11-24 ENCOUNTER — Encounter: Payer: Self-pay | Admitting: Family Medicine

## 2022-11-24 VITALS — BP 98/72 | HR 94 | Temp 98.7°F | Resp 17 | Ht 62.0 in | Wt 136.5 lb

## 2022-11-24 DIAGNOSIS — N3010 Interstitial cystitis (chronic) without hematuria: Secondary | ICD-10-CM

## 2022-11-24 DIAGNOSIS — N390 Urinary tract infection, site not specified: Secondary | ICD-10-CM

## 2022-11-24 DIAGNOSIS — G4700 Insomnia, unspecified: Secondary | ICD-10-CM | POA: Diagnosis not present

## 2022-11-24 DIAGNOSIS — M797 Fibromyalgia: Secondary | ICD-10-CM | POA: Diagnosis not present

## 2022-11-24 MED ORDER — NITROFURANTOIN MONOHYD MACRO 100 MG PO CAPS: 100.0000 mg | ORAL_CAPSULE | Freq: Every day | ORAL | 3 refills | Status: AC

## 2022-11-24 MED ORDER — TRAZODONE HCL 50 MG PO TABS: 25.0000 mg | ORAL_TABLET | Freq: Every evening | ORAL | 3 refills | Status: DC | PRN

## 2022-11-24 MED ORDER — DULOXETINE HCL 30 MG PO CPEP: 30.0000 mg | ORAL_CAPSULE | Freq: Every day | ORAL | 1 refills | Status: DC

## 2022-11-24 NOTE — Patient Instructions (Signed)
Schedule your complete physical in 6 months START the Cymbalta 30mg  daily ADD the Trazodone nightly USE the Macrobid after intercourse to prevent infection We'll call you to schedule your Urology appt Keep me updated on how the pain and sleep are doing Call with any questions or concerns Hang in there!!

## 2022-11-24 NOTE — Assessment & Plan Note (Signed)
New to provider, ongoing for pt.  She used to get bladder instillations but has not been seen by urology in years.  Would like to be referred for a re-evaluation.  Referral placed.

## 2022-11-24 NOTE — Progress Notes (Signed)
   Subjective:    Patient ID: Martha Moss, female    DOB: 19-Mar-1966, 57 y.o.   MRN: [id number]  HPI Fibromyalgia- 'i just can't get this pain under control'.  Pt reports pain is no longer constant after back injection but still struggling w/ pain control.  Currently on Cymbalta 20mg  daily.  Felt that Savella worked better but this is too expensive.  Taking Tylenol for pain.  Not sleeping well due to pain.  Does not tolerate Ambien.  Recurrent UTI- pt has hx of both recurrent UTI's (particularly after intercourse) and also interstitial cystitis.  Was previously on abx prophylaxis and would like to restart if possible   Review of Systems For ROS see HPI     Objective:   Physical Exam Vitals reviewed.  Constitutional:      General: She is not in acute distress.    Appearance: Normal appearance. She is not ill-appearing.  HENT:     Head: Normocephalic and atraumatic.  Eyes:     Extraocular Movements: Extraocular movements intact.     Conjunctiva/sclera: Conjunctivae normal.     Pupils: Pupils are equal, round, and reactive to light.  Cardiovascular:     Rate and Rhythm: Normal rate and regular rhythm.  Pulmonary:     Effort: Pulmonary effort is normal. No respiratory distress.  Skin:    General: Skin is warm and dry.  Neurological:     General: No focal deficit present.     Mental Status: She is alert and oriented to person, place, and time.  Psychiatric:        Mood and Affect: Mood normal.        Behavior: Behavior normal.        Thought Content: Thought content normal.           Assessment & Plan:

## 2022-11-24 NOTE — Assessment & Plan Note (Signed)
Pt was previously on Macrobid prophylaxis to use after intercourse.  She is again having issues w/ recurrent infections and wonders if she could restart abx prn.  Prescription sent.

## 2022-11-24 NOTE — Assessment & Plan Note (Signed)
Ongoing issue.  Did not tolerate Ambien in the past.  Will start w/ Trazodone as she has taken this before and done well.  Will follow.

## 2022-11-24 NOTE — Assessment & Plan Note (Signed)
Deteriorated.  Pt reports she is having a hard time managing her pain.  She is currently only using Tylenol PRN and is on Cymbalta 20mg  daily.  Will increase Cymbalta to 30mg  daily and try and treat her underlying insomnia using Trazodone.  If she is able to sleep, her pain may not be as severe.  Will follow.

## 2022-12-11 ENCOUNTER — Ambulatory Visit: Payer: Commercial Managed Care - HMO | Admitting: Family Medicine

## 2023-02-20 ENCOUNTER — Other Ambulatory Visit: Payer: Self-pay | Admitting: Gastroenterology

## 2023-03-18 ENCOUNTER — Ambulatory Visit: Payer: Commercial Managed Care - HMO | Admitting: Family Medicine

## 2023-03-18 ENCOUNTER — Encounter: Payer: Self-pay | Admitting: Family Medicine

## 2023-03-18 VITALS — BP 116/80 | HR 86 | Temp 98.4°F | Resp 17 | Ht 62.0 in | Wt 138.0 lb

## 2023-03-18 DIAGNOSIS — J029 Acute pharyngitis, unspecified: Secondary | ICD-10-CM | POA: Diagnosis not present

## 2023-03-18 DIAGNOSIS — Z1152 Encounter for screening for COVID-19: Secondary | ICD-10-CM

## 2023-03-18 LAB — POC COVID19 BINAXNOW: SARS Coronavirus 2 Ag: NEGATIVE

## 2023-03-18 LAB — POCT RAPID STREP A (OFFICE): Rapid Strep A Screen: NEGATIVE

## 2023-03-18 MED ORDER — PREDNISONE 10 MG PO TABS
ORAL_TABLET | ORAL | 0 refills | Status: DC
Start: 2023-03-18 — End: 2023-06-02

## 2023-03-18 NOTE — Progress Notes (Signed)
   Subjective:    Patient ID: NAFISA LEMMER, female    DOB: 1965/09/11, 57 y.o.   MRN: [id number]  HPI Sore throat- pt reports she has had ongoing sore throat since beginning of July when she had COVID.  Painful to swallow.  Cough has improved.  + PND.  Taking Mucinex and Tylenol w/o relief.  Is on Xyzal daily.   Review of Systems For ROS see HPI     Objective:   Physical Exam Vitals reviewed.  Constitutional:      General: She is not in acute distress.    Appearance: She is well-developed.  HENT:     Head: Normocephalic and atraumatic.     Right Ear: Tympanic membrane normal.     Left Ear: Tympanic membrane normal.     Nose: Mucosal edema and congestion present. No rhinorrhea.     Right Sinus: No maxillary sinus tenderness or frontal sinus tenderness.     Left Sinus: No maxillary sinus tenderness or frontal sinus tenderness.     Mouth/Throat:     Pharynx: Posterior oropharyngeal erythema (w/ PND) present.  Eyes:     Conjunctiva/sclera: Conjunctivae normal.     Pupils: Pupils are equal, round, and reactive to light.  Cardiovascular:     Rate and Rhythm: Normal rate and regular rhythm.     Heart sounds: Normal heart sounds.  Pulmonary:     Effort: Pulmonary effort is normal. No respiratory distress.     Breath sounds: Normal breath sounds. No wheezing or rales.  Musculoskeletal:     Cervical back: Normal range of motion and neck supple.  Lymphadenopathy:     Cervical: No cervical adenopathy.  Skin:    General: Skin is warm and dry.  Neurological:     General: No focal deficit present.     Mental Status: She is alert and oriented to person, place, and time.  Psychiatric:        Mood and Affect: Mood normal.        Behavior: Behavior normal.        Thought Content: Thought content normal.           Assessment & Plan:   Sore throat- new.  Pt reports this has been going on for 4 weeks- since getting COVID.  Strep and COVID are negative today.  Suspect her sxs  are due to the copious PND.  Start Prednisone to decrease congestion and inflammation and hopefully improve/resolve PND.  Reviewed supportive care and red flags that should prompt return.  Pt expressed understanding and is in agreement w/ plan.

## 2023-03-18 NOTE — Patient Instructions (Signed)
Follow up as needed or as scheduled START the Prednisone as directed- 3 pills at the same time x3 days, then 2 pills at the same time x3 days, then 1 pill daily.  Take w/ food  Drink LOTS of fluids to rinse the back of your throat Call with any questions Hang in there!!!

## 2023-03-31 ENCOUNTER — Telehealth: Payer: Self-pay | Admitting: Gastroenterology

## 2023-03-31 NOTE — Telephone Encounter (Signed)
Inbound call requesting to speak about protonix medication. Patient is requesting to know if she is able to take medication in the morning and at night. Requesting a call back to discuss further. Also advised patient if needing future refills she will need to schedule on office visit. Please advise, thank you.

## 2023-03-31 NOTE — Telephone Encounter (Signed)
Left detailed message to patient. Patient needs to schedule follow up appointment as well.

## 2023-05-04 ENCOUNTER — Telehealth: Payer: Self-pay | Admitting: Gastroenterology

## 2023-05-04 MED ORDER — PANTOPRAZOLE SODIUM 40 MG PO TBEC: 40.0000 mg | DELAYED_RELEASE_TABLET | Freq: Every day | ORAL | 0 refills | Status: DC

## 2023-05-04 NOTE — Telephone Encounter (Signed)
Rx sent 

## 2023-05-04 NOTE — Telephone Encounter (Signed)
Inbound call from patient, requesting refill of Protonix sent to pharmacy on file. Patient scheduled visit for 12/18.

## 2023-06-02 ENCOUNTER — Ambulatory Visit: Payer: Managed Care, Other (non HMO) | Admitting: Family Medicine

## 2023-06-02 ENCOUNTER — Encounter: Payer: Self-pay | Admitting: Family Medicine

## 2023-06-02 VITALS — BP 114/78 | HR 70 | Temp 97.9°F | Ht 61.25 in | Wt 139.1 lb

## 2023-06-02 DIAGNOSIS — E663 Overweight: Secondary | ICD-10-CM | POA: Diagnosis not present

## 2023-06-02 DIAGNOSIS — Z0000 Encounter for general adult medical examination without abnormal findings: Secondary | ICD-10-CM

## 2023-06-02 DIAGNOSIS — Z23 Encounter for immunization: Secondary | ICD-10-CM | POA: Diagnosis not present

## 2023-06-02 LAB — CBC WITH DIFFERENTIAL/PLATELET
Basophils Absolute: 0.1 10*3/uL (ref 0.0–0.1)
Basophils Relative: 0.9 % (ref 0.0–3.0)
Eosinophils Absolute: 0.2 10*3/uL (ref 0.0–0.7)
Eosinophils Relative: 3.4 % (ref 0.0–5.0)
HCT: 39.3 % (ref 36.0–46.0)
Hemoglobin: 12.8 g/dL (ref 12.0–15.0)
Lymphocytes Relative: 44.2 % (ref 12.0–46.0)
Lymphs Abs: 2.5 10*3/uL (ref 0.7–4.0)
MCHC: 32.7 g/dL (ref 30.0–36.0)
MCV: 94.7 fL (ref 78.0–100.0)
Monocytes Absolute: 0.5 10*3/uL (ref 0.1–1.0)
Monocytes Relative: 8.2 % (ref 3.0–12.0)
Neutro Abs: 2.5 10*3/uL (ref 1.4–7.7)
Neutrophils Relative %: 43.3 % (ref 43.0–77.0)
Platelets: 269 10*3/uL (ref 150.0–400.0)
RBC: 4.15 Mil/uL (ref 3.87–5.11)
RDW: 13.6 % (ref 11.5–15.5)
WBC: 5.7 10*3/uL (ref 4.0–10.5)

## 2023-06-02 LAB — BASIC METABOLIC PANEL
BUN: 21 mg/dL (ref 6–23)
CO2: 25 meq/L (ref 19–32)
Calcium: 9.4 mg/dL (ref 8.4–10.5)
Chloride: 110 meq/L (ref 96–112)
Creatinine, Ser: 0.76 mg/dL (ref 0.40–1.20)
GFR: 86.91 mL/min (ref >60.00)
Glucose, Bld: 82 mg/dL (ref 70–99)
Potassium: 3.9 meq/L (ref 3.5–5.1)
Sodium: 144 meq/L (ref 135–145)

## 2023-06-02 LAB — HEPATIC FUNCTION PANEL
ALT: 12 U/L (ref 0–35)
AST: 17 U/L (ref 0–37)
Albumin: 4.3 g/dL (ref 3.5–5.2)
Alkaline Phosphatase: 68 U/L (ref 39–117)
Bilirubin, Direct: 0 mg/dL (ref 0.0–0.3)
Total Bilirubin: 0.3 mg/dL (ref 0.2–1.2)
Total Protein: 7.2 g/dL (ref 6.0–8.3)

## 2023-06-02 LAB — LIPID PANEL
Cholesterol: 306 mg/dL — ABNORMAL HIGH (ref 0–200)
HDL: 58 mg/dL (ref >39.00)
LDL Cholesterol: 218 mg/dL — ABNORMAL HIGH (ref 0–99)
NonHDL: 248.24
Total CHOL/HDL Ratio: 5
Triglycerides: 150 mg/dL — ABNORMAL HIGH (ref 0.0–149.0)
VLDL: 30 mg/dL (ref 0.0–40.0)

## 2023-06-02 LAB — TSH: TSH: 2.38 u[IU]/mL (ref 0.35–5.50)

## 2023-06-02 NOTE — Assessment & Plan Note (Signed)
Pt's PE WNL w/ exception of know UE lipomas and BMI.  UTD on colonoscopy, Tdap.  Due for shingles- will get at pharmacy.  Flu shot given today.  Check labs.  Anticipatory guidance provided.

## 2023-06-02 NOTE — Progress Notes (Signed)
   Subjective:    Patient ID: Martha Moss, female    DOB: 04-Oct-1965, 57 y.o.   MRN: [id number]  HPI CPE- due for mammogram.  Refuses Tdap.  Will get flu today.  UTD on colonoscopy.  Patient Care Team    Relationship Specialty Notifications Start End  Sheliah Hatch, MD PCP - General Family Medicine  02/03/12      Health Maintenance  Topic Date Due   MAMMOGRAM  11/15/2015   Zoster Vaccines- Shingrix (1 of 2) Never done   DTaP/Tdap/Td (2 - Tdap) 08/14/2018   INFLUENZA VACCINE  03/12/2023   COVID-19 Vaccine (4 - 2023-24 season) 04/12/2023   Colonoscopy  10/31/2031   Hepatitis C Screening  Completed   HIV Screening  Completed   HPV VACCINES  Aged Out      Review of Systems Patient reports no vision/ hearing changes, adenopathy,fever, weight change,  persistant/recurrent hoarseness, chest pain, palpitations, edema, hemoptysis, dyspnea (rest/exertional/paroxysmal nocturnal), gastrointestinal bleeding (melena, rectal bleeding), abdominal pain, bowel changes, GU symptoms (dysuria, hematuria, incontinence), Gyn symptoms (abnormal  bleeding, pain),  syncope, focal weakness, memory loss, numbness & tingling, skin/hair/nail changes, abnormal bruising or bleeding, anxiety, or depression.   + cough- pt feels this is reflux related.  Wakes up w/ cough. + dysphagia- has had esophageal dilation    Objective:   Physical Exam General Appearance:    Alert, cooperative, no distress, appears stated age  Head:    Normocephalic, without obvious abnormality, atraumatic  Eyes:    PERRL, conjunctiva/corneas clear, EOM's intact both eyes  Ears:    Normal TM's and external ear canals, both ears  Nose:   Nares normal, septum midline, mucosa normal, no drainage    or sinus tenderness  Throat:   Lips, mucosa, and tongue normal; teeth and gums normal  Neck:   Supple, symmetrical, trachea midline, no adenopathy;    Thyroid: no enlargement/tenderness/nodules  Back:     Symmetric, no curvature,  ROM normal, no CVA tenderness  Lungs:     Clear to auscultation bilaterally, respirations unlabored  Chest Wall:    No tenderness or deformity   Heart:    Regular rate and rhythm, S1 and S2 normal, no murmur, rub   or gallop  Breast Exam:    Deferred to mammo  Abdomen:     Soft, non-tender, bowel sounds active all four quadrants,    no masses, no organomegaly  Genitalia:    Deferred  Rectal:    Extremities:   Extremities atraumatic, no cyanosis or edema.  Multiple lipomas on upper extremities  Pulses:   2+ and symmetric all extremities  Skin:   Skin color, texture, turgor normal, no rashes or lesions  Lymph nodes:   Cervical, supraclavicular, and axillary nodes normal  Neurologic:   CNII-XII intact, normal strength, sensation and reflexes    throughout          Assessment & Plan:

## 2023-06-02 NOTE — Patient Instructions (Signed)
Follow up in 1 year or as needed We'll notify you of your lab results and make any changes if needed Keep up the good work on healthy diet and regular exercise- you're doing great! Let me know when you're ready to see someone about the lipomas and we can make the referral SCHEDULE YOUR MAMMOGRAM!! Call with any questions or concerns Stay Safe!  Stay Healthy! Hang in there!!!

## 2023-06-03 ENCOUNTER — Telehealth: Payer: Self-pay

## 2023-06-03 ENCOUNTER — Other Ambulatory Visit: Payer: Self-pay

## 2023-06-03 DIAGNOSIS — E785 Hyperlipidemia, unspecified: Secondary | ICD-10-CM

## 2023-06-03 MED ORDER — ROSUVASTATIN CALCIUM 20 MG PO TABS: 20.0000 mg | ORAL_TABLET | Freq: Every evening | ORAL | 1 refills | Status: DC

## 2023-06-03 NOTE — Telephone Encounter (Signed)
-----   Message from Neena Rhymes sent at 06/03/2023 11:39 AM EDT ----- Labs look good w/ exception of total cholesterol and LDL (bad cholesterol) which are both ~100 points above goal.  Based on this, we need to start Crestor (Rosuvastatin) 20mg  nightly (#90, 1 refill) while working on healthy diet and regular exercise.  We will repeat your liver functions at a lab only visit in 6 weeks to ensure you are metabolizing the medication appropriately (LFTs, dx hyperlipidemia).  At this level, your cholesterol increases your risk for heart attack and stroke and I want to minimize that as much as possible.

## 2023-06-03 NOTE — Progress Notes (Signed)
lft

## 2023-06-04 NOTE — Telephone Encounter (Signed)
Letter out

## 2023-06-04 NOTE — Telephone Encounter (Signed)
Seen by patient Martha Moss on 06/03/2023  5:45 PM Via MyChart

## 2023-06-04 NOTE — Telephone Encounter (Signed)
Called pt in attempt to schedule lab only visit, no answer LM to call back and schedule lab only for about 07/14/2023

## 2023-06-05 ENCOUNTER — Other Ambulatory Visit: Payer: Self-pay | Admitting: Family Medicine

## 2023-06-05 DIAGNOSIS — Z1231 Encounter for screening mammogram for malignant neoplasm of breast: Secondary | ICD-10-CM

## 2023-06-08 ENCOUNTER — Emergency Department (HOSPITAL_COMMUNITY): Payer: Commercial Managed Care - HMO

## 2023-06-08 ENCOUNTER — Encounter (HOSPITAL_COMMUNITY): Payer: Self-pay

## 2023-06-08 ENCOUNTER — Observation Stay (HOSPITAL_COMMUNITY)
Admission: EM | Admit: 2023-06-08 | Discharge: 2023-06-09 | Disposition: A | Discharge status: Home / Self Care | Payer: Commercial Managed Care - HMO | Attending: Family Medicine | Admitting: Family Medicine

## 2023-06-08 DIAGNOSIS — Z789 Other specified health status: Secondary | ICD-10-CM

## 2023-06-08 DIAGNOSIS — I959 Hypotension, unspecified: Secondary | ICD-10-CM | POA: Diagnosis not present

## 2023-06-08 DIAGNOSIS — R479 Unspecified speech disturbances: Secondary | ICD-10-CM | POA: Diagnosis not present

## 2023-06-08 DIAGNOSIS — E785 Hyperlipidemia, unspecified: Secondary | ICD-10-CM | POA: Insufficient documentation

## 2023-06-08 DIAGNOSIS — Z7982 Long term (current) use of aspirin: Secondary | ICD-10-CM | POA: Diagnosis not present

## 2023-06-08 DIAGNOSIS — G459 Transient cerebral ischemic attack, unspecified: Secondary | ICD-10-CM | POA: Diagnosis not present

## 2023-06-08 DIAGNOSIS — Z91040 Latex allergy status: Secondary | ICD-10-CM | POA: Diagnosis not present

## 2023-06-08 DIAGNOSIS — R000 Tachycardia, unspecified: Secondary | ICD-10-CM | POA: Diagnosis not present

## 2023-06-08 DIAGNOSIS — M797 Fibromyalgia: Secondary | ICD-10-CM | POA: Diagnosis present

## 2023-06-08 DIAGNOSIS — Z79899 Other long term (current) drug therapy: Secondary | ICD-10-CM | POA: Diagnosis not present

## 2023-06-08 DIAGNOSIS — F32A Depression, unspecified: Secondary | ICD-10-CM | POA: Diagnosis present

## 2023-06-08 DIAGNOSIS — Z8669 Personal history of other diseases of the nervous system and sense organs: Secondary | ICD-10-CM

## 2023-06-08 LAB — COMPREHENSIVE METABOLIC PANEL
ALT: 12 U/L (ref 0–44)
AST: 22 U/L (ref 15–41)
Albumin: 4.2 g/dL (ref 3.5–5.0)
Alkaline Phosphatase: 70 U/L (ref 38–126)
Anion gap: 9 (ref 5–15)
BUN: 19 mg/dL (ref 6–20)
CO2: 23 mmol/L (ref 22–32)
Calcium: 9.4 mg/dL (ref 8.9–10.3)
Chloride: 110 mmol/L (ref 98–111)
Creatinine, Ser: 0.99 mg/dL (ref 0.44–1.00)
GFR, Estimated: >60 mL/min (ref >60)
Glucose, Bld: 105 mg/dL — ABNORMAL HIGH (ref 70–99)
Potassium: 3.7 mmol/L (ref 3.5–5.1)
Sodium: 142 mmol/L (ref 135–145)
Total Bilirubin: 0.3 mg/dL (ref 0.3–1.2)
Total Protein: 7.6 g/dL (ref 6.5–8.1)

## 2023-06-08 LAB — CBC
HCT: 39.9 % (ref 36.0–46.0)
Hemoglobin: 13.1 g/dL (ref 12.0–15.0)
MCH: 30.8 pg (ref 26.0–34.0)
MCHC: 32.8 g/dL (ref 30.0–36.0)
MCV: 93.7 fL (ref 80.0–100.0)
Platelets: 274 10*3/uL (ref 150–400)
RBC: 4.26 MIL/uL (ref 3.87–5.11)
RDW: 12.9 % (ref 11.5–15.5)
WBC: 8 10*3/uL (ref 4.0–10.5)
nRBC: 0 % (ref 0.0–0.2)

## 2023-06-08 LAB — DIFFERENTIAL
Abs Immature Granulocytes: 0.02 10*3/uL (ref 0.00–0.07)
Basophils Absolute: 0.1 10*3/uL (ref 0.0–0.1)
Basophils Relative: 1 %
Eosinophils Absolute: 0.2 10*3/uL (ref 0.0–0.5)
Eosinophils Relative: 2 %
Immature Granulocytes: 0 %
Lymphocytes Relative: 51 %
Lymphs Abs: 4 10*3/uL (ref 0.7–4.0)
Monocytes Absolute: 0.6 10*3/uL (ref 0.1–1.0)
Monocytes Relative: 7 %
Neutro Abs: 3.1 10*3/uL (ref 1.7–7.7)
Neutrophils Relative %: 39 %

## 2023-06-08 LAB — I-STAT CHEM 8, ED
BUN: 20 mg/dL (ref 6–20)
Calcium, Ion: 1.23 mmol/L (ref 1.15–1.40)
Chloride: 109 mmol/L (ref 98–111)
Creatinine, Ser: 1 mg/dL (ref 0.44–1.00)
Glucose, Bld: 102 mg/dL — ABNORMAL HIGH (ref 70–99)
HCT: 40 % (ref 36.0–46.0)
Hemoglobin: 13.6 g/dL (ref 12.0–15.0)
Potassium: 3.6 mmol/L (ref 3.5–5.1)
Sodium: 143 mmol/L (ref 135–145)
TCO2: 23 mmol/L (ref 22–32)

## 2023-06-08 LAB — ETHANOL: Alcohol, Ethyl (B): <10 mg/dL (ref <10)

## 2023-06-08 LAB — CBG MONITORING, ED: Glucose-Capillary: 88 mg/dL (ref 70–99)

## 2023-06-08 LAB — APTT: aPTT: 22 s — ABNORMAL LOW (ref 24–36)

## 2023-06-08 LAB — PROTIME-INR
INR: 0.9 (ref 0.8–1.2)
Prothrombin Time: 12.7 s (ref 11.4–15.2)

## 2023-06-08 NOTE — ED Provider Notes (Signed)
[facility] EMERGENCY DEPARTMENT AT [facility] Provider Note   CSN: [id number] Arrival date & time: 06/08/23  2027     History  Chief Complaint  Patient presents with   Code Stroke    Martha Moss is a 57 y.o. female.  57 yo F with a chief complaints of difficulty with her speech.  This happened suddenly.  She was brought to the emergency department.  She said something similar happened to her a few years ago and they could not figure out the cause.  Code stroke was called by the triage nurse.  Level 5 caveat acuity condition.        Home Medications Prior to Admission medications   Medication Sig Start Date End Date Taking? Authorizing Provider  DULoxetine (CYMBALTA) 30 MG capsule Take 1 capsule (30 mg total) by mouth daily. 11/24/22  Yes Sheliah Hatch, MD  famotidine (PEPCID) 20 MG tablet Take 20 mg by mouth in the morning and at bedtime. Taking once daily, patient does not recall the dose   Yes [provider]  fexofenadine (ALLEGRA) 180 MG tablet Take 180 mg by mouth as directed. Monday Wednesday and Friday   Yes [provider]  levocetirizine (XYZAL) 5 MG tablet Take 5 mg by mouth as directed. Tuesday Thursday Saturday and Sunday   Yes [provider]  magnesium 30 MG tablet Take 30 mg by mouth every other day.   Yes [provider]  meloxicam (MOBIC) 15 MG tablet Take 15 mg by mouth daily. 03/19/23  Yes [provider]  nitrofurantoin, macrocrystal-monohydrate, (MACROBID) 100 MG capsule Take 1 capsule (100 mg total) by mouth at bedtime. 11/24/22  Yes Sheliah Hatch, MD  pantoprazole (PROTONIX) 40 MG tablet Take 1 tablet (40 mg total) by mouth daily. Please call [phone] to schedule an office visit for more refills. 05/04/23  Yes Cirigliano, Vito V, DO  rosuvastatin (CRESTOR) 20 MG tablet Take 1 tablet (20 mg total) by mouth at bedtime. 06/03/23  Yes Sheliah Hatch, MD  topiramate (TOPAMAX) 100 MG  tablet Take 100 mg by mouth 2 (two) times daily. 06/08/23 06/02/24 Yes [provider]  traZODone (DESYREL) 50 MG tablet Take 0.5-1 tablets (25-50 mg total) by mouth at bedtime as needed for sleep. 11/24/22  Yes Sheliah Hatch, MD  VITAMIN D PO Take 1,000 Units by mouth daily.   Yes [provider]  vitamin E 1000 UNIT capsule Take 1,000 Units by mouth daily.   Yes [provider]      Allergies    Bee venom, Azithromycin, Diphenhydramine hcl, Erythromycin, Morphine and codeine, Penicillins, Percocet [oxycodone-acetaminophen], Sulfonamide derivatives, Latex, and Tetracyclines & related    Review of Systems   Review of Systems  Physical Exam Updated Vital Signs BP 105/81   Pulse (!) 108   Temp 98.3 F (36.8 C)   Resp 18   Ht 5' 1.25" (1.556 m)   Wt 68.3 kg   SpO2 98%   BMI 28.22 kg/m  Physical Exam Vitals and nursing note reviewed.  Constitutional:      General: She is not in acute distress.    Appearance: She is well-developed. She is not diaphoretic.  HENT:     Head: Normocephalic and atraumatic.  Eyes:     Pupils: Pupils are equal, round, and reactive to light.  Cardiovascular:     Rate and Rhythm: Normal rate and regular rhythm.     Heart sounds: No murmur heard.  No friction rub. No gallop.  Pulmonary:     Effort: Pulmonary effort is normal.     Breath sounds: No wheezing or rales.  Abdominal:     General: There is no distension.     Palpations: Abdomen is soft.     Tenderness: There is no abdominal tenderness.  Musculoskeletal:        General: No tenderness.     Cervical back: Normal range of motion and neck supple.  Skin:    General: Skin is warm and dry.  Neurological:     Mental Status: She is alert and oriented to person, place, and time.     GCS: GCS eye subscore is 4. GCS verbal subscore is 5. GCS motor subscore is 6.     Cranial Nerves: Cranial nerves 2-12 are intact.     Sensory: Sensation is intact.     Motor: Motor  function is intact.     Coordination: Coordination is intact.     Comments: Patient has some difficulty with speech.  More stuttering than aphasia on initial eval.   Psychiatric:        Behavior: Behavior normal.     ED Results / Procedures / Treatments   Labs (all labs ordered are listed, but only abnormal results are displayed) Labs Reviewed  APTT - Abnormal; Notable for the following components:      Result Value   aPTT 22 (*)    All other components within normal limits  COMPREHENSIVE METABOLIC PANEL - Abnormal; Notable for the following components:   Glucose, Bld 105 (*)    All other components within normal limits  I-STAT CHEM 8, ED - Abnormal; Notable for the following components:   Glucose, Bld 102 (*)    All other components within normal limits  ETHANOL  PROTIME-INR  CBC  DIFFERENTIAL  RAPID URINE DRUG SCREEN, HOSP PERFORMED  URINALYSIS, ROUTINE W REFLEX MICROSCOPIC  CBG MONITORING, ED    EKG EKG Interpretation Date/Time:  Monday June 08 2023 22:09:04 EDT Ventricular Rate:  102 PR Interval:  166 QRS Duration:  107 QT Interval:  358 QTC Calculation: 467 R Axis:   86  Text Interpretation: Sinus tachycardia Ventricular premature complex No significant change since last tracing Confirmed by Melene Plan [phone]) on 06/08/2023 10:47:00 PM  Radiology CT HEAD CODE STROKE WO CONTRAST  Result Date: 06/08/2023 CLINICAL DATA:  Code stroke.  Neuro deficit, acute, stroke suspected EXAM: CT HEAD WITHOUT CONTRAST TECHNIQUE: Contiguous axial images were obtained from the base of the skull through the vertex without intravenous contrast. RADIATION DOSE REDUCTION: This exam was performed according to the departmental dose-optimization program which includes automated exposure control, adjustment of the mA and/or kV according to patient size and/or use of iterative reconstruction technique. COMPARISON:  MRI head November 23, 21. FINDINGS: Brain: No evidence of acute large  vascular territory infarction, hemorrhage, hydrocephalus, extra-axial collection or mass lesion/mass effect. Vascular: No hyperdense vessel. Skull: No acute fracture. Sinuses/Orbits: Clear stenosis.  No acute orbital findings. Other: No mastoid effusions. ASPECTS [facility] Stroke Program Early CT Score) total score (0-10 with 10 being normal): 10. IMPRESSION: 1. No evidence of acute large vascular territory infarct or acute hemorrhage. 2. ASPECTS is 10. Electronically Signed   By: Feliberto Harts M.D.   On: 06/08/2023 20:53    Procedures Procedures    Medications Ordered in ED Medications - No data to display  ED Course/ Medical Decision Making/ A&P  Medical Decision Making Amount and/or Complexity of Data Reviewed Labs: ordered. Radiology: ordered.   57 yo F with a cc of sudden onset difficulty with speech. Made a code stroke.  Seen three years ago with similar symptoms.  Negative MRI and EEG at that time.   CT of the head here without intracranial hemorrhage.  No significant electrolyte abnormalities.  No anemia.  No leukocytosis.  Patient seen by neurology.  Awaiting MRI. Signed out to Dr. Nicanor Alcon, please see their note for further details of care in the ED.  The patients results and plan were reviewed and discussed.   Any x-rays performed were independently reviewed by myself.   Differential diagnosis were considered with the presenting HPI.  Medications - No data to display  Vitals:   06/08/23 2200 06/08/23 2215 06/08/23 2220 06/08/23 2230  BP: 124/74 117/67  105/81  Pulse: (!) 101 (!) 103  (!) 108  Resp: 15 15  18   Temp:      SpO2: 100% 100%  98%  Weight:   68.3 kg   Height:   5' 1.25" (1.556 m)     Final diagnoses:  Difficulty with speech            Final Clinical Impression(s) / ED Diagnoses Final diagnoses:  Difficulty with speech    Rx / DC Orders ED Discharge Orders     None         Melene Plan,  DO 06/08/23 2324

## 2023-06-08 NOTE — Code Documentation (Signed)
Stroke Response Nurse Documentation Code Documentation  Martha Moss is a 57 y.o. female arriving to [facility]  via [employer] on 06/08/23 with past medical hx of depression, fibromyalgia, migraine headaches. On aspirin 81 mg daily. Code stroke was activated by ED.   Patient from home where she was LKW at 2015 and now complaining of slurred speech . Pt brought in by husband with sudden slurred speech and facial droop. Similar episode happened a few years ago that lasted a few days per pt and husband.   Stroke team met pt in CT.  NIHSS 3, see documentation for details and code stroke times.  The following imaging was completed:  CT Head and MRI. Patient is not a candidate for IV Thrombolytic due to symptoms mild/improving. Patient is not a candidate for IR due to no LVO.    Mordecai Rasmussen  Stroke Response RN

## 2023-06-08 NOTE — ED Triage Notes (Signed)
Pt presents to ED POV with complaints of slurred speech and facial droop that started at 2015. Per husband pt is not herself and had a similar incident happened on Saturday that resolved on its own. Pt is alert and oriented. Pt answer questions correctly however speech appears slow and difficult to get words out. Pt denies pain at current time but states had a headache yesterday.

## 2023-06-08 NOTE — Consult Note (Signed)
NEURO HOSPITALIST CONSULT NOTE   Requestig physician: Dr. Adela Lank  Reason for Consult: Acute onset of left facial droop and "not acting right"  History obtained from:  Patient and Chart     HPI:                                                                                                                                          DARYN GOSNELL is an 57 y.o. female with a PMHx of depression, fibromyalgia, migraine headaches and a spell 3 years ago of seizure versus TIA versus complicated migraine for which she was admitted to [facility] with no conclusive diagnosis following work up regarding the etiology. She presents to the ED from home after acute onset of stuttering speech and "not acting right". Husband states that she then developed left facial droop, so he brought her to the ED.   Husband stated on arrival that a similar incident happened on Saturday that resolved on its own. She was alert and oriented on initial assessments, answering questions correctly but with slow speech with apparent difficulty getting the words out. She denies pain at current time but states that she had a headache yesterday.   She states that it took "a few days" for the symptoms to resolve with her spell 3 years ago. Husband states that her symptoms with that event were essentially identical to the symptoms with which she is presenting now.   Code Stroke was called after arrival. LKN 2015.  Past Medical History:  Diagnosis Date   Depression    Fibromyalgia    GERD (gastroesophageal reflux disease)    Migraine    PONV (postoperative nausea and vomiting)     Past Surgical History:  Procedure Laterality Date   BIOPSY  07/06/2020   Procedure: BIOPSY;  Surgeon: Shellia Cleverly, DO;  Location: [facility];  Service: Gastroenterology;;   CHOLECYSTECTOMY     ESOPHAGOGASTRODUODENOSCOPY Left 07/06/2020   Procedure: ESOPHAGOGASTRODUODENOSCOPY (EGD);  Surgeon: Shellia Cleverly, DO;   Location: [facility] ENDOSCOPY;  Service: Gastroenterology;  Laterality: Left;   ETHMOIDECTOMY Bilateral 09/09/2016   Procedure: ETHMOIDECTOMY;  Surgeon: Newman Pies, MD;  Location: [facility];  Service: ENT;  Laterality: Bilateral;   FRONTAL SINUS EXPLORATION Bilateral 09/09/2016   Procedure: FRONTAL RECESS SINUS EXPLORATION;  Surgeon: Newman Pies, MD;  Location: [facility];  Service: ENT;  Laterality: Bilateral;   MALONEY DILATION  07/06/2020   Procedure: Elease Hashimoto DILATION;  Surgeon: Shellia Cleverly, DO;  Location: [facility];  Service: Gastroenterology;;   MAXILLARY ANTROSTOMY Bilateral 09/09/2016   Procedure: MAXILLARY ANTROSTOMY WITH TISSUE REMOVAL;  Surgeon: Newman Pies, MD;  Location: [facility];  Service: ENT;  Laterality: Bilateral;   OVARIAN CYST SURGERY     SINUS ENDO W/FUSION  Bilateral 09/09/2016   Procedure: ENDOSCOPIC SINUS SURGERY WITH NAVIGATION;  Surgeon: Newman Pies, MD;  Location: [facility];  Service: ENT;  Laterality: Bilateral;   TOTAL VAGINAL HYSTERECTOMY     TURBINATE REDUCTION Bilateral 09/09/2016   Procedure: BILATERAL TURBINATE REDUCTION;  Surgeon: Newman Pies, MD;  Location: [facility];  Service: ENT;  Laterality: Bilateral;    Family History  Problem Relation Age of Onset   Kidney Stones Daughter    Migraines Mother    Heart attack Father 5   Diabetes Maternal Grandmother    Stroke Sister    Migraines Brother    Stroke Brother    Heart disease Brother    Migraines Other        many family members on maternal side    Colon cancer Neg Hx    Pancreatic cancer Neg Hx    Esophageal cancer Neg Hx               Social History:  reports that she has never smoked. She has never used smokeless tobacco. She reports current alcohol use. She reports that she does not use drugs.  Allergies  Allergen Reactions   Bee Venom    Azithromycin     REACTION: hives   Diphenhydramine Hcl     REACTION: hives   Erythromycin      REACTION: hives   Latex     REACTION: rash   Morphine And Codeine Nausea And Vomiting   Penicillins     REACTION: rash, fever   Percocet [Oxycodone-Acetaminophen] Other (See Comments)    Hallucinations    Sulfonamide Derivatives     REACTION: rash, fever   Tetracyclines & Related     HOME MEDICATIONS:                                                                                                                      No current facility-administered medications on file prior to encounter.   Current Outpatient Medications on File Prior to Encounter  Medication Sig Dispense Refill   DULoxetine (CYMBALTA) 30 MG capsule Take 1 capsule (30 mg total) by mouth daily. 90 capsule 1   famotidine (PEPCID) 20 MG tablet Take 20 mg by mouth in the morning and at bedtime. Taking once daily, patient does not recall the dose     fexofenadine (ALLEGRA) 180 MG tablet Take 180 mg by mouth as directed. Monday Wednesday and Friday     levocetirizine (XYZAL) 5 MG tablet Take 5 mg by mouth as directed. Tuesday Thursday Saturday and Sunday     magnesium 30 MG tablet Take 30 mg by mouth every other day.     meloxicam (MOBIC) 15 MG tablet Take 15 mg by mouth daily.     nitrofurantoin, macrocrystal-monohydrate, (MACROBID) 100 MG capsule Take 1 capsule (100 mg total) by mouth at bedtime. 30 capsule 3   pantoprazole (PROTONIX) 40 MG tablet Take 1 tablet (40 mg total) by mouth daily. Please call [phone] to  schedule an office visit for more refills. 90 tablet 0   rosuvastatin (CRESTOR) 20 MG tablet Take 1 tablet (20 mg total) by mouth at bedtime. 90 tablet 1   topiramate (TOPAMAX) 100 MG tablet Take 100 mg by mouth 2 (two) times daily.     traZODone (DESYREL) 50 MG tablet Take 0.5-1 tablets (25-50 mg total) by mouth at bedtime as needed for sleep. 30 tablet 3   VITAMIN D PO Take 1,000 Units by mouth daily.     vitamin E 1000 UNIT capsule Take 1,000 Units by mouth daily.       ROS:                                                                                                                                        As per HPI. Detailed ROS deferred in the context of acuity of presentation.    Blood pressure 111/64, pulse (!) 128, temperature 98.3 F (36.8 C), resp. rate 15, SpO2 100%.   General Examination:                                                                                                       Physical Exam  HEENT-  /AT    Lungs- Respirations unlabored Extremities- No edema   Neurological Examination Mental Status: Alert, oriented x 5, with mildly anxious affect.  Speech with intermittent stuttering and mild dysarthria. There is a fluctuating quality and some distractibility, suggestive of non-physiological origin. In the context of the above, comprehension is intact to all questions and commands, with intact naming. No errors of grammar or syntax in the context of her intermittent stuttering.  Cranial Nerves: II: Temporal visual fields intact with no extinction to DSS. PERRL  III,IV, VI: No ptosis. EOMI.  V: Subjectively decreased sensation to left side of face  VII: Smile symmetric VIII: Hearing intact to voice IX,X: No hoarseness XI: Symmetric shoulder shrug XII: Midline tongue extension Motor: BUE 5/5 proximally and distally BLE 5/5 proximally and distally  No pronator drift.  Intermittent tremor to LUE is distractible and increases when exam is focused on upper extremity function. Tone and bulk are normal.   Sensory: Temp and light touch decreased to LUE relative to the right. BLE with normal sensation. No extinction to DSS.  Deep Tendon Reflexes: 2+ and symmetric throughout Cerebellar: No ataxia with FNF bilaterally  Gait: Deferred  NIHSS: 1    Lab Results: Basic Metabolic Panel: Recent Labs  Lab 06/02/23 1020  06/08/23 2041 06/08/23 2043  NA 144 142 143  K 3.9 3.7 3.6  CL 110 110 109  CO2 25 23  --   GLUCOSE 82 105* 102*  BUN 21 19 20    CREATININE 0.76 0.99 1.00  CALCIUM 9.4 9.4  --     CBC: Recent Labs  Lab 06/02/23 1020 06/08/23 2041 06/08/23 2043  WBC 5.7 8.0  --   NEUTROABS 2.5 3.1  --   HGB 12.8 13.1 13.6  HCT 39.3 39.9 40.0  MCV 94.7 93.7  --   PLT 269.0 274  --     Cardiac Enzymes: No results for input(s): "CKTOTAL", "CKMB", "CKMBINDEX", "TROPONINI" in the last 168 hours.  Lipid Panel: Recent Labs  Lab 06/02/23 1020  CHOL 306*  TRIG 150.0*  HDL 58.00  CHOLHDL 5  VLDL 30.0  LDLCALC 191*    Imaging: CT HEAD CODE STROKE WO CONTRAST  Result Date: 06/08/2023 CLINICAL DATA:  Code stroke.  Neuro deficit, acute, stroke suspected EXAM: CT HEAD WITHOUT CONTRAST TECHNIQUE: Contiguous axial images were obtained from the base of the skull through the vertex without intravenous contrast. RADIATION DOSE REDUCTION: This exam was performed according to the departmental dose-optimization program which includes automated exposure control, adjustment of the mA and/or kV according to patient size and/or use of iterative reconstruction technique. COMPARISON:  MRI head November 23, 21. FINDINGS: Brain: No evidence of acute large vascular territory infarction, hemorrhage, hydrocephalus, extra-axial collection or mass lesion/mass effect. Vascular: No hyperdense vessel. Skull: No acute fracture. Sinuses/Orbits: Clear stenosis.  No acute orbital findings. Other: No mastoid effusions. ASPECTS [facility] Stroke Program Early CT Score) total score (0-10 with 10 being normal): 10. IMPRESSION: 1. No evidence of acute large vascular territory infarct or acute hemorrhage. 2. ASPECTS is 10. Electronically Signed   By: Feliberto Harts M.D.   On: 06/08/2023 20:53     Assessment: 57 year old female presenting with acute onset of left facial droop and "not acting right" - Exam reveals subjective left face and arm sensory deficits and stuttering speech with a non-physiological quality. Also noted is intermittent tremor to LUE that is  distractible and increases when exam is focused on upper extremity function. - CT head: No evidence of acute large vascular territory infarct or acute hemorrhage. ASPECTS is 10. - Unable to obtain CTA due to difficulty obtaining IV access. Will obtain STAT MRI brain, MRA head and MRA neck.  - DDx for presentation includes functional/non-physiological neurological symptoms versus a small stroke or TIA. Given low NIHSS and relatively high likelihood of a functional etiology, the risks of TNK significantly outweigh potential benefits.   Recommendations: - STAT MRI of brain with MRA of head and neck.  - Frequent neuro checks.   Addendum: - Preliminary review of MRI brain reveals no acute abnormality.  - Preliminary review of MRA head/neck reveals no LVO - Speech Consult - Start ASA 81 mg po every day.  - TTE - Cardiac telemetry - PT/OT - Continue her statin - HgbA1c, fasting lipid panel   Electronically signed: Dr. Caryl Pina 06/08/2023, 9:20 PM

## 2023-06-08 NOTE — ED Notes (Signed)
Pt presents to ED POV with complaints of slurred speech and facial droop that started at 2015. Per husband pt is not herself and had a similar incident happened on Saturday that resolved on its own. Pt is alert and oriented. Pt answer questions correctly however speech appears slow and difficult to get words out. Pt denies pain at current time but states had a headache yesterday.   EDP Dr. Adela Lank assessed pt at 2033 and activated code stroke Code Stroke activated at 2035 CT scan at 2041 Stroke team arrived in CT at 2043 Neurology arrived at 2045  Pt IV infiltrated in CT unable to obtain CT angio  Neurologist ordered MRI

## 2023-06-09 ENCOUNTER — Observation Stay (HOSPITAL_BASED_OUTPATIENT_CLINIC_OR_DEPARTMENT_OTHER): Payer: Commercial Managed Care - HMO

## 2023-06-09 ENCOUNTER — Encounter (HOSPITAL_COMMUNITY): Payer: Self-pay | Admitting: Internal Medicine

## 2023-06-09 ENCOUNTER — Inpatient Hospital Stay (HOSPITAL_BASED_OUTPATIENT_CLINIC_OR_DEPARTMENT_OTHER)
Admit: 2023-06-09 | Discharge: 2023-06-09 | Disposition: A | Discharge status: Home / Self Care | Payer: Managed Care, Other (non HMO) | Attending: Internal Medicine | Admitting: Internal Medicine

## 2023-06-09 ENCOUNTER — Other Ambulatory Visit: Payer: Self-pay | Admitting: Cardiology

## 2023-06-09 ENCOUNTER — Other Ambulatory Visit: Payer: Self-pay

## 2023-06-09 DIAGNOSIS — G459 Transient cerebral ischemic attack, unspecified: Secondary | ICD-10-CM

## 2023-06-09 DIAGNOSIS — I639 Cerebral infarction, unspecified: Secondary | ICD-10-CM | POA: Diagnosis not present

## 2023-06-09 DIAGNOSIS — F329 Major depressive disorder, single episode, unspecified: Secondary | ICD-10-CM | POA: Diagnosis not present

## 2023-06-09 DIAGNOSIS — I959 Hypotension, unspecified: Secondary | ICD-10-CM | POA: Diagnosis not present

## 2023-06-09 DIAGNOSIS — Z8669 Personal history of other diseases of the nervous system and sense organs: Secondary | ICD-10-CM

## 2023-06-09 DIAGNOSIS — F449 Dissociative and conversion disorder, unspecified: Secondary | ICD-10-CM

## 2023-06-09 DIAGNOSIS — Z789 Other specified health status: Secondary | ICD-10-CM | POA: Diagnosis not present

## 2023-06-09 DIAGNOSIS — M797 Fibromyalgia: Secondary | ICD-10-CM

## 2023-06-09 LAB — ECHOCARDIOGRAM COMPLETE
AR max vel: 2.35 cm2
AV Area VTI: 2.3 cm2
AV Area mean vel: 2.24 cm2
AV Mean grad: 4 mm[Hg]
AV Peak grad: 6.6 mm[Hg]
Ao pk vel: 1.28 m/s
Area-P 1/2: 4.21 cm2
Height: 61.25 in
S' Lateral: 1.9 cm
Weight: 2409.19 [oz_av]

## 2023-06-09 LAB — CBC
HCT: 31.2 % — ABNORMAL LOW (ref 36.0–46.0)
Hemoglobin: 10.3 g/dL — ABNORMAL LOW (ref 12.0–15.0)
MCH: 31.4 pg (ref 26.0–34.0)
MCHC: 33 g/dL (ref 30.0–36.0)
MCV: 95.1 fL (ref 80.0–100.0)
Platelets: 240 10*3/uL (ref 150–400)
RBC: 3.28 MIL/uL — ABNORMAL LOW (ref 3.87–5.11)
RDW: 12.7 % (ref 11.5–15.5)
WBC: 7.4 10*3/uL (ref 4.0–10.5)
nRBC: 0 % (ref 0.0–0.2)

## 2023-06-09 LAB — LIPID PANEL
Cholesterol: 195 mg/dL (ref 0–200)
HDL: 46 mg/dL (ref >40)
LDL Cholesterol: 118 mg/dL — ABNORMAL HIGH (ref 0–99)
Total CHOL/HDL Ratio: 4.2 {ratio}
Triglycerides: 153 mg/dL — ABNORMAL HIGH (ref <150)
VLDL: 31 mg/dL (ref 0–40)

## 2023-06-09 LAB — HIV ANTIBODY (ROUTINE TESTING W REFLEX): HIV Screen 4th Generation wRfx: NONREACTIVE

## 2023-06-09 LAB — COMPREHENSIVE METABOLIC PANEL
ALT: 14 U/L (ref 0–44)
AST: 16 U/L (ref 15–41)
Albumin: 3.1 g/dL — ABNORMAL LOW (ref 3.5–5.0)
Alkaline Phosphatase: 58 U/L (ref 38–126)
Anion gap: 6 (ref 5–15)
BUN: 21 mg/dL — ABNORMAL HIGH (ref 6–20)
CO2: 23 mmol/L (ref 22–32)
Calcium: 8.6 mg/dL — ABNORMAL LOW (ref 8.9–10.3)
Chloride: 112 mmol/L — ABNORMAL HIGH (ref 98–111)
Creatinine, Ser: 0.92 mg/dL (ref 0.44–1.00)
GFR, Estimated: >60 mL/min (ref >60)
Glucose, Bld: 108 mg/dL — ABNORMAL HIGH (ref 70–99)
Potassium: 3.7 mmol/L (ref 3.5–5.1)
Sodium: 141 mmol/L (ref 135–145)
Total Bilirubin: 0.3 mg/dL (ref 0.3–1.2)
Total Protein: 5.6 g/dL — ABNORMAL LOW (ref 6.5–8.1)

## 2023-06-09 LAB — HEMOGLOBIN A1C
Hgb A1c MFr Bld: 5.4 % (ref 4.8–5.6)
Mean Plasma Glucose: 108.28 mg/dL

## 2023-06-09 MED ORDER — ONDANSETRON HCL 4 MG PO TABS: 4.0000 mg | ORAL_TABLET | Freq: Four times a day (QID) | ORAL | Status: DC | PRN

## 2023-06-09 MED ORDER — ROSUVASTATIN CALCIUM 40 MG PO TABS: 40.0000 mg | ORAL_TABLET | Freq: Every day | ORAL | 0 refills | Status: DC

## 2023-06-09 MED ORDER — LACTATED RINGERS IV BOLUS
500.0000 mL | Freq: Once | INTRAVENOUS | Status: AC
  Administered 2023-06-09: 500 mL via INTRAVENOUS

## 2023-06-09 MED ORDER — TOPIRAMATE 100 MG PO TABS
100.0000 mg | ORAL_TABLET | Freq: Two times a day (BID) | ORAL | Status: DC
  Administered 2023-06-09 (×2): 100 mg via ORAL
  Filled 2023-06-09: qty 4
  Filled 2023-06-09: qty 1

## 2023-06-09 MED ORDER — DULOXETINE HCL 30 MG PO CPEP
30.0000 mg | ORAL_CAPSULE | Freq: Every day | ORAL | Status: DC
  Administered 2023-06-09: 30 mg via ORAL
  Filled 2023-06-09: qty 1

## 2023-06-09 MED ORDER — ONDANSETRON HCL 4 MG/2ML IJ SOLN: 4.0000 mg | Freq: Four times a day (QID) | INTRAMUSCULAR | Status: DC | PRN

## 2023-06-09 MED ORDER — LACTATED RINGERS IV SOLN
INTRAVENOUS | Status: DC
  Administered 2023-06-09: 1000 mL via INTRAVENOUS

## 2023-06-09 MED ORDER — STROKE: EARLY STAGES OF RECOVERY BOOK: Freq: Once | Status: DC

## 2023-06-09 MED ORDER — ROSUVASTATIN CALCIUM 20 MG PO TABS
40.0000 mg | ORAL_TABLET | Freq: Every day | ORAL | Status: DC
  Administered 2023-06-09: 40 mg via ORAL
  Filled 2023-06-09: qty 2

## 2023-06-09 MED ORDER — PANTOPRAZOLE SODIUM 40 MG PO TBEC
40.0000 mg | DELAYED_RELEASE_TABLET | Freq: Every day | ORAL | Status: DC
  Administered 2023-06-09: 40 mg via ORAL
  Filled 2023-06-09: qty 1

## 2023-06-09 MED ORDER — ENOXAPARIN SODIUM 40 MG/0.4ML IJ SOSY
40.0000 mg | PREFILLED_SYRINGE | INTRAMUSCULAR | Status: DC
  Administered 2023-06-09: 40 mg via SUBCUTANEOUS
  Filled 2023-06-09: qty 0.4

## 2023-06-09 MED ORDER — ASPIRIN 81 MG PO CHEW: 81.0000 mg | CHEWABLE_TABLET | Freq: Every day | ORAL | 0 refills | Status: DC

## 2023-06-09 MED ORDER — IBUPROFEN 200 MG PO TABS: 400.0000 mg | ORAL_TABLET | Freq: Four times a day (QID) | ORAL | Status: DC | PRN

## 2023-06-09 MED ORDER — SODIUM CHLORIDE 0.9 % IV SOLN: 250.0000 mL | INTRAVENOUS | Status: DC | PRN

## 2023-06-09 MED ORDER — ASPIRIN 81 MG PO CHEW
81.0000 mg | CHEWABLE_TABLET | Freq: Every day | ORAL | Status: DC
  Administered 2023-06-09: 81 mg via ORAL
  Filled 2023-06-09: qty 1

## 2023-06-09 MED ORDER — ASPIRIN 81 MG PO CHEW: 81.0000 mg | CHEWABLE_TABLET | Freq: Every day | ORAL | 0 refills | Status: AC

## 2023-06-09 MED ORDER — SODIUM CHLORIDE 0.9% FLUSH: 3.0000 mL | INTRAVENOUS | Status: DC | PRN

## 2023-06-09 MED ORDER — SODIUM CHLORIDE 0.9% FLUSH
3.0000 mL | Freq: Two times a day (BID) | INTRAVENOUS | Status: DC
  Administered 2023-06-09 (×2): 3 mL via INTRAVENOUS

## 2023-06-09 NOTE — Progress Notes (Signed)
Echocardiogram 2D Echocardiogram has been performed.  Martha Moss Martha Moss 06/09/2023, 10:07 AM

## 2023-06-09 NOTE — Progress Notes (Signed)
Cardiology asked to place live Zio 14 day at discharge per neurology.

## 2023-06-09 NOTE — Evaluation (Signed)
Physical Therapy Evaluation Patient Details Name: Martha Moss MRN: [id number] DOB: Aug 11, 1966 Today's Date: 06/09/2023  History of Present Illness  Martha Moss is a 57 y.o. female presented to emergency department for evaluation for acute onset of speech difficulty and facial droop. DDx for presentation includes functional/non-physiological neurological symptoms versus a small stroke or TIA--CT/MRI negative. PHMx: depression, fibromyalgia, migraine, history of seizure spell 3 years ago versus TIA complicated by migraine.  Clinical Impression  Pt is presenting slightly below baseline level of functioning. Prior to hospitalization pt was ind; currently pt is supervision for gait and stairs without an AD. Pt has a SPC at home that she used when this happened previously. No significant coordination deficits noted with testing and pt is reporting some tingling in bil LE reporting she believes if is from laying in the bed. Pt has very supportive family. Due to pt current functional status, home set up and available assistance at home no recommending skilled physical therapy services at this time on discharge from acute care hospital setting. Will continue to follow in acute care hospital setting in order to ensure pt returns home at highest level of function to decrease risk for falls and injury.         If plan is discharge home, recommend the following: Help with stairs or ramp for entrance;Assistance with cooking/housework     Equipment Recommendations None recommended by PT     Functional Status Assessment Patient has had a recent decline in their functional status and demonstrates the ability to make significant improvements in function in a reasonable and predictable amount of time.     Precautions / Restrictions Precautions Precautions: Fall Restrictions Weight Bearing Restrictions: No      Mobility  Bed Mobility Overal bed mobility: Modified Independent     General  bed mobility comments: HOB slightly elevated no use of rails.    Transfers Overall transfer level: Modified independent Equipment used: None               General transfer comment: slightly increased BOS, cautious    Ambulation/Gait Ambulation/Gait assistance: Supervision Gait Distance (Feet): 200 Feet Assistive device: None Gait Pattern/deviations: Step-through pattern, Decreased stride length Gait velocity: decreased Gait velocity interpretation: 1.31 - 2.62 ft/sec, indicative of limited community ambulator   General Gait Details: intermittently holding hand out for walls/rails, no overt LOB  Stairs Stairs: Yes Stairs assistance: Supervision Stair Management: One rail Right, Forwards Number of Stairs: 4 General stair comments: stairs per home set up. Supervision for safety. pt moves very cautiously   Modified Rankin (Stroke Patients Only) Modified Rankin (Stroke Patients Only) Pre-Morbid Rankin Score: No symptoms Modified Rankin: No significant disability     Balance Overall balance assessment: Mild deficits observed, not formally tested           Pertinent Vitals/Pain Pain Assessment Pain Assessment: No/denies pain    Home Living Family/patient expects to be discharged to:: Private residence Living Arrangements: Spouse/significant other Available Help at Discharge: Friend(s);Available 24 hours/day (spouse is retired and has 3 daughters who live close by) Type of Home: House Home Access: Stairs to enter Entrance Stairs-Rails: Right Entrance Stairs-Number of Steps: 5   Home Layout: One level Home Equipment: Cane - single point Additional Comments: pt reports 2 falls in 6 months. She has as SPC but does not use it.    Prior Function Prior Level of Function : Independent/Modified Independent;Working/employed;Driving             Mobility Comments:  Pt is a nanny ADLs Comments: Ind with ADL's and IADLs.     Extremity/Trunk Assessment   Upper  Extremity Assessment Upper Extremity Assessment: Defer to OT evaluation    Lower Extremity Assessment Lower Extremity Assessment: Overall WFL for tasks assessed    Cervical / Trunk Assessment Cervical / Trunk Assessment: Normal  Communication   Communication Communication: No apparent difficulties Cueing Techniques: Verbal cues  Cognition Arousal: Alert Behavior During Therapy: WFL for tasks assessed/performed Overall Cognitive Status: Within Functional Limits for tasks assessed        General Comments General comments (skin integrity, edema, etc.): Spouse present at beginning of session. Daughter present throughout. Family is very supportive. Pt is aware of deficits. Slight impairment in accuracy and speed with coordination testing Ramps, finger opposition and toe taps.        Assessment/Plan    PT Assessment Patient needs continued PT services  PT Problem List Decreased balance       PT Treatment Interventions DME instruction;Functional mobility training;Balance training;Gait training;Therapeutic activities;Stair training;Therapeutic exercise;Patient/family education    PT Goals (Current goals can be found in the Care Plan section)  Acute Rehab PT Goals Patient Stated Goal: To return home with family PT Goal Formulation: With patient Time For Goal Achievement: 06/23/23 Potential to Achieve Goals: Good    Frequency Min 1X/week        AM-PAC PT "6 Clicks" Mobility  Outcome Measure Help needed turning from your back to your side while in a flat bed without using bedrails?: None Help needed moving from lying on your back to sitting on the side of a flat bed without using bedrails?: None Help needed moving to and from a bed to a chair (including a wheelchair)?: None Help needed standing up from a chair using your arms (e.g., wheelchair or bedside chair)?: None Help needed to walk in hospital room?: A Little Help needed climbing 3-5 steps with a railing? : A  Little 6 Click Score: 22    End of Session Equipment Utilized During Treatment: Gait belt Activity Tolerance: Patient tolerated treatment well Patient left: in bed;with call bell/phone within reach;with bed alarm set;with family/visitor present Nurse Communication: Mobility status PT Visit Diagnosis: Unsteadiness on feet (R26.81)    Time: 1478-2956 PT Time Calculation (min) (ACUTE ONLY): 30 min   Charges:   PT Evaluation $PT Eval Low Complexity: 1 Low PT Treatments $Therapeutic Activity: 8-22 mins PT General Charges $$ ACUTE PT VISIT: 1 Visit         Harrel Carina, DPT, CLT  Acute Rehabilitation Services Office: [phone] (Secure chat preferred)   Martha Moss 06/09/2023, 9:28 AM

## 2023-06-09 NOTE — H&P (Addendum)
History and Physical    Martha Moss:[id number] DOB: 1965/12/10 DOA: 06/08/2023  PCP: Sheliah Hatch, MD   Patient coming from: Home   Chief Complaint:  Chief Complaint  Patient presents with   Code Stroke   ED TRIAGE note:Pt presents to ED POV with complaints of slurred speech and facial droop that started at [facility] 10/28. Per husband pt is not herself and had a similar incident happened on Saturday that resolved on its own. Pt is alert and oriented. Pt answer questions correctly however speech appears slow and difficult to get words out. Pt denies pain at current time but states had a headache yesterday. EDP Dr. Adela Lank assessed pt at 2033 and activated code stroke Code Stroke activated at 2035 CT scan at 2041 Stroke team arrived in CT at 2043 Neurology arrived at 2045    HPI:  Martha Moss is a 58 y.o. female with medical history depression, fibromyalgia, migraine, history of seizure spell 3 years ago versus TIA complicated by migraine presented to emergency department for evaluation for acute onset of speech difficulty.  Patient reported shattering his speech and not acting right by husband.  Husband stated that patient also developed left-sided facial droop as well.  Husband reported that similar incident happened on Saturday and resolved on its own.  Patient is currently alert and oriented and able to answer all the questions correctly.  However she has low speech with apparent difficulty getting the words out.    During my evaluation at the bedside patient does not have any facial droop anymore.  Patient also reported no speech difficulty, word finding difficulty and she is able to express herself appropriately.  Denies any upper and lower extremities weakness.  Patient denies any headache, blurry vision, weakness, focal weakness, chest pain, palpitation shortness of breath.  At sensation to ED code stroke activated and neurology Dr. Janice Coffin has been evaluated  recommended a stat MRI of the brain with MRA of head and neck and frequent neurocheck.  Afterward neurologist evaluated the CT head, MRI of the head, MRA head and neck no acute abnormality.  Recommended speech consult, continue aspirin 81 mg daily, TTE, cardiac telemetry, PT/OT, continue statin, check lipid panel A1c.  Differential diagnosis by neurology is functional/non-physiological neurological symptom versus TIA.   ED Course:  At presentation to ED heart rate 128 which has been improved to 93.  Respirate rate 15, blood pressure 1141/64 which has been trended down to 95/65. Lab work blood glucose within normal range.  Pending UDS.  Blood alcohol level less than 10.  Normal pro time and INR.  Slightly low PTT.  CBC grossly unremarkable.  CMP unremarkable. Imaging, CT head unremarkable. Normal MRI brain finding.   Normal MRA. EKG showing sinus tachycardia heart rate 102.  Hospital has been contacted for further evaluation and management of transient ischemic attack.  Review of Systems:  Review of Systems  Constitutional:  Negative for diaphoresis and malaise/fatigue.  HENT:  Negative for tinnitus.   Eyes:  Negative for blurred vision.  Respiratory:  Negative for cough, sputum production and shortness of breath.   Cardiovascular:  Negative for chest pain, palpitations and leg swelling.  Gastrointestinal:  Negative for abdominal pain, constipation, diarrhea, heartburn, nausea and vomiting.  Musculoskeletal:  Negative for myalgias and neck pain.  Neurological:  Negative for dizziness, tingling, tremors, sensory change, speech change, focal weakness, seizures, loss of consciousness, weakness and headaches.  Psychiatric/Behavioral:  The patient is not nervous/anxious.  Past Medical History:  Diagnosis Date   Depression    Fibromyalgia    GERD (gastroesophageal reflux disease)    Migraine    PONV (postoperative nausea and vomiting)     Past Surgical History:  Procedure Laterality  Date   BIOPSY  07/06/2020   Procedure: BIOPSY;  Surgeon: Shellia Cleverly, DO;  Location: [facility];  Service: Gastroenterology;;   CHOLECYSTECTOMY     ESOPHAGOGASTRODUODENOSCOPY Left 07/06/2020   Procedure: ESOPHAGOGASTRODUODENOSCOPY (EGD);  Surgeon: Shellia Cleverly, DO;  Location: [facility];  Service: Gastroenterology;  Laterality: Left;   ETHMOIDECTOMY Bilateral 09/09/2016   Procedure: ETHMOIDECTOMY;  Surgeon: Newman Pies, MD;  Location: [facility];  Service: ENT;  Laterality: Bilateral;   FRONTAL SINUS EXPLORATION Bilateral 09/09/2016   Procedure: FRONTAL RECESS SINUS EXPLORATION;  Surgeon: Newman Pies, MD;  Location: [facility];  Service: ENT;  Laterality: Bilateral;   MALONEY DILATION  07/06/2020   Procedure: Elease Hashimoto DILATION;  Surgeon: Shellia Cleverly, DO;  Location: [facility];  Service: Gastroenterology;;   MAXILLARY ANTROSTOMY Bilateral 09/09/2016   Procedure: MAXILLARY ANTROSTOMY WITH TISSUE REMOVAL;  Surgeon: Newman Pies, MD;  Location: [facility];  Service: ENT;  Laterality: Bilateral;   OVARIAN CYST SURGERY     SINUS ENDO W/FUSION Bilateral 09/09/2016   Procedure: ENDOSCOPIC SINUS SURGERY WITH NAVIGATION;  Surgeon: Newman Pies, MD;  Location: [facility];  Service: ENT;  Laterality: Bilateral;   TOTAL VAGINAL HYSTERECTOMY     TURBINATE REDUCTION Bilateral 09/09/2016   Procedure: BILATERAL TURBINATE REDUCTION;  Surgeon: Newman Pies, MD;  Location: [facility];  Service: ENT;  Laterality: Bilateral;     reports that she has never smoked. She has never used smokeless tobacco. She reports current alcohol use. She reports that she does not use drugs.  Allergies  Allergen Reactions   Bee Venom Anaphylaxis   Azithromycin     REACTION: hives   Diphenhydramine Hcl     REACTION: hives   Erythromycin     REACTION: hives   Morphine And Codeine Nausea And Vomiting   Penicillins     REACTION: rash, fever   Percocet  [Oxycodone-Acetaminophen] Other (See Comments)    Hallucinations    Sulfonamide Derivatives     REACTION: rash, fever   Latex Swelling and Rash    Face and tongue swelling   Tetracyclines & Related Rash    fever    Family History  Problem Relation Age of Onset   Kidney Stones Daughter    Migraines Mother    Heart attack Father 96   Diabetes Maternal Grandmother    Stroke Sister    Migraines Brother    Stroke Brother    Heart disease Brother    Migraines Other        many family members on maternal side    Colon cancer Neg Hx    Pancreatic cancer Neg Hx    Esophageal cancer Neg Hx     Prior to Admission medications   Medication Sig Start Date End Date Taking? Authorizing Provider  DULoxetine (CYMBALTA) 30 MG capsule Take 1 capsule (30 mg total) by mouth daily. 11/24/22  Yes Sheliah Hatch, MD  famotidine (PEPCID) 20 MG tablet Take 20 mg by mouth in the morning and at bedtime. Taking once daily, patient does not recall the dose   Yes [provider]  fexofenadine (ALLEGRA) 180 MG tablet Take 180 mg by mouth as directed. Monday Wednesday and Friday  Yes [provider]  levocetirizine (XYZAL) 5 MG tablet Take 5 mg by mouth as directed. Tuesday Thursday Saturday and Sunday   Yes [provider]  magnesium 30 MG tablet Take 30 mg by mouth every other day.   Yes [provider]  meloxicam (MOBIC) 15 MG tablet Take 15 mg by mouth daily. 03/19/23  Yes [provider]  nitrofurantoin, macrocrystal-monohydrate, (MACROBID) 100 MG capsule Take 1 capsule (100 mg total) by mouth at bedtime. 11/24/22  Yes Sheliah Hatch, MD  pantoprazole (PROTONIX) 40 MG tablet Take 1 tablet (40 mg total) by mouth daily. Please call [phone] to schedule an office visit for more refills. 05/04/23  Yes Cirigliano, Vito V, DO  rosuvastatin (CRESTOR) 20 MG tablet Take 1 tablet (20 mg total) by mouth at bedtime. 06/03/23  Yes Sheliah Hatch, MD   topiramate (TOPAMAX) 100 MG tablet Take 100 mg by mouth 2 (two) times daily. 06/08/23 06/02/24 Yes [provider]  traZODone (DESYREL) 50 MG tablet Take 0.5-1 tablets (25-50 mg total) by mouth at bedtime as needed for sleep. 11/24/22  Yes Sheliah Hatch, MD  VITAMIN D PO Take 1,000 Units by mouth daily.   Yes [provider]  vitamin E 1000 UNIT capsule Take 1,000 Units by mouth daily.   Yes [provider]     Physical Exam: Vitals:   06/09/23 0130 06/09/23 0145 06/09/23 0210 06/09/23 0240  BP: 104/70 102/63 104/63 107/61  Pulse: 84 88 84 85  Resp: 14 14 14 15   Temp:      SpO2: 98% 97% 98% 97%  Weight:      Height:        Physical Exam Constitutional:      General: She is not in acute distress.    Appearance: She is not ill-appearing.  HENT:     Head: Normocephalic.     Nose: Nose normal.     Mouth/Throat:     Mouth: Mucous membranes are moist.  Eyes:     Conjunctiva/sclera: Conjunctivae normal.  Cardiovascular:     Rate and Rhythm: Normal rate and regular rhythm.     Pulses: Normal pulses.     Heart sounds: Normal heart sounds.  Pulmonary:     Effort: Pulmonary effort is normal.     Breath sounds: Normal breath sounds.  Abdominal:     General: Bowel sounds are normal.  Musculoskeletal:     Cervical back: Neck supple.     Right lower leg: No edema.     Left lower leg: No edema.  Neurological:     Mental Status: She is oriented to person, place, and time.     Cranial Nerves: No cranial nerve deficit.     Sensory: No sensory deficit.     Motor: No weakness.     Coordination: Coordination normal.     Gait: Gait normal.     Deep Tendon Reflexes: Reflexes normal.  Psychiatric:        Mood and Affect: Mood normal.        Thought Content: Thought content normal.        Judgment: Judgment normal.      Labs on Admission: I have personally reviewed following labs and imaging studies  CBC: Recent Labs  Lab 06/02/23 1020  06/08/23 2041 06/08/23 2043  WBC 5.7 8.0  --   NEUTROABS 2.5 3.1  --   HGB 12.8 13.1 13.6  HCT 39.3 39.9 40.0  MCV 94.7 93.7  --  PLT 269.0 274  --    Basic Metabolic Panel: Recent Labs  Lab 06/02/23 1020 06/08/23 2041 06/08/23 2043  NA 144 142 143  K 3.9 3.7 3.6  CL 110 110 109  CO2 25 23  --   GLUCOSE 82 105* 102*  BUN 21 19 20   CREATININE 0.76 0.99 1.00  CALCIUM 9.4 9.4  --    GFR: Estimated Creatinine Clearance: 55.3 mL/min (by C-G formula based on SCr of 1 mg/dL). Liver Function Tests: Recent Labs  Lab 06/02/23 1020 06/08/23 2041  AST 17 22  ALT 12 12  ALKPHOS 68 70  BILITOT 0.3 0.3  PROT 7.2 7.6  ALBUMIN 4.3 4.2   No results for input(s): "LIPASE", "AMYLASE" in the last 168 hours. No results for input(s): "AMMONIA" in the last 168 hours. Coagulation Profile: Recent Labs  Lab 06/08/23 2041  INR 0.9   Cardiac Enzymes: No results for input(s): "CKTOTAL", "CKMB", "CKMBINDEX", "TROPONINI", "TROPONINIHS" in the last 168 hours. BNP (last 3 results) No results for input(s): "BNP" in the last 8760 hours. HbA1C: No results for input(s): "HGBA1C" in the last 72 hours. CBG: Recent Labs  Lab 06/08/23 2032  GLUCAP 88   Lipid Profile: No results for input(s): "CHOL", "HDL", "LDLCALC", "TRIG", "CHOLHDL", "LDLDIRECT" in the last 72 hours. Thyroid Function Tests: No results for input(s): "TSH", "T4TOTAL", "FREET4", "T3FREE", "THYROIDAB" in the last 72 hours. Anemia Panel: No results for input(s): "VITAMINB12", "FOLATE", "FERRITIN", "TIBC", "IRON", "RETICCTPCT" in the last 72 hours. Urine analysis:    Component Value Date/Time   COLORURINE YELLOW 08/23/2017 1140   APPEARANCEUR CLEAR 08/23/2017 1140   LABSPEC 1.025 08/23/2017 1140   PHURINE 6.5 08/23/2017 1140   GLUCOSEU NEGATIVE 08/23/2017 1140   HGBUR NEGATIVE 08/23/2017 1140   HGBUR negative 10/26/2009 1516   BILIRUBINUR negative 11/04/2022 0851   BILIRUBINUR negative 08/27/2017 1127   KETONESUR  negative 11/04/2022 0851   KETONESUR NEGATIVE 08/23/2017 1140   PROTEINUR =100 (A) 11/04/2022 0851   PROTEINUR Negative 01/27/2022 1517   PROTEINUR NEGATIVE 08/23/2017 1140   UROBILINOGEN 2.0 (A) 11/04/2022 0851   UROBILINOGEN 0.2 03/05/2015 1402   NITRITE Positive (A) 11/04/2022 0851   NITRITE negative 08/27/2017 1127   NITRITE NEGATIVE 08/23/2017 1140   LEUKOCYTESUR Large (3+) (A) 11/04/2022 0851    Radiological Exams on Admission: I have personally reviewed images MR BRAIN WO CONTRAST  Result Date: 06/09/2023 CLINICAL DATA:  Acute neurologic deficit EXAM: MRI HEAD WITHOUT CONTRAST MRA HEAD WITHOUT CONTRAST MRA NECK WITHOUT CONTRAST TECHNIQUE: Multiplanar, multiecho pulse sequences of the brain and surrounding structures were obtained without intravenous contrast. Angiographic images of the Circle of Willis were obtained using MRA technique without intravenous contrast. Angiographic images of the neck were obtained using MRA technique without intravenous contrast. Carotid stenosis measurements (when applicable) are obtained utilizing NASCET criteria, using the distal internal carotid diameter as the denominator. COMPARISON:  None Available. FINDINGS: MRI HEAD FINDINGS Brain: No acute infarct, mass effect or extra-axial collection. No acute or chronic hemorrhage. Minimal multifocal hyperintense T2-weight signal within the white matter. The midline structures are normal. Vascular: Normal flow voids. Skull and upper cervical spine: Normal calvarium and skull base. Visualized upper cervical spine and soft tissues are normal. Sinuses/Orbits:No paranasal sinus fluid levels or advanced mucosal thickening. No mastoid or middle ear effusion. Normal orbits. MRA HEAD FINDINGS POSTERIOR CIRCULATION: --Vertebral arteries: Normal --Inferior cerebellar arteries: Normal. --Basilar artery: Normal. --Superior cerebellar arteries: Normal. --Posterior cerebral arteries: Normal. ANTERIOR CIRCULATION: --Intracranial  internal carotid arteries: Normal. --  Anterior cerebral arteries (ACA): Normal. --Middle cerebral arteries (MCA): Normal. MRA NECK FINDINGS There are areas of limited visualization due to time-of-flight technique, but the visible portions of the vertebral and carotid arteries are normal. There is no stenosis of the carotid bifurcations. The vertebral system is codominant. IMPRESSION: 1. No acute intracranial abnormality. 2. Normal intracranial MRA. 3. Normal MRA of the neck. Electronically Signed   By: Deatra Robinson M.D.   On: 06/09/2023 00:55   MR ANGIO HEAD WO CONTRAST  Result Date: 06/09/2023 CLINICAL DATA:  Acute neurologic deficit EXAM: MRI HEAD WITHOUT CONTRAST MRA HEAD WITHOUT CONTRAST MRA NECK WITHOUT CONTRAST TECHNIQUE: Multiplanar, multiecho pulse sequences of the brain and surrounding structures were obtained without intravenous contrast. Angiographic images of the Circle of Willis were obtained using MRA technique without intravenous contrast. Angiographic images of the neck were obtained using MRA technique without intravenous contrast. Carotid stenosis measurements (when applicable) are obtained utilizing NASCET criteria, using the distal internal carotid diameter as the denominator. COMPARISON:  None Available. FINDINGS: MRI HEAD FINDINGS Brain: No acute infarct, mass effect or extra-axial collection. No acute or chronic hemorrhage. Minimal multifocal hyperintense T2-weight signal within the white matter. The midline structures are normal. Vascular: Normal flow voids. Skull and upper cervical spine: Normal calvarium and skull base. Visualized upper cervical spine and soft tissues are normal. Sinuses/Orbits:No paranasal sinus fluid levels or advanced mucosal thickening. No mastoid or middle ear effusion. Normal orbits. MRA HEAD FINDINGS POSTERIOR CIRCULATION: --Vertebral arteries: Normal --Inferior cerebellar arteries: Normal. --Basilar artery: Normal. --Superior cerebellar arteries: Normal.  --Posterior cerebral arteries: Normal. ANTERIOR CIRCULATION: --Intracranial internal carotid arteries: Normal. --Anterior cerebral arteries (ACA): Normal. --Middle cerebral arteries (MCA): Normal. MRA NECK FINDINGS There are areas of limited visualization due to time-of-flight technique, but the visible portions of the vertebral and carotid arteries are normal. There is no stenosis of the carotid bifurcations. The vertebral system is codominant. IMPRESSION: 1. No acute intracranial abnormality. 2. Normal intracranial MRA. 3. Normal MRA of the neck. Electronically Signed   By: Deatra Robinson M.D.   On: 06/09/2023 00:55   MR ANGIO NECK WO CONTRAST  Result Date: 06/09/2023 CLINICAL DATA:  Acute neurologic deficit EXAM: MRI HEAD WITHOUT CONTRAST MRA HEAD WITHOUT CONTRAST MRA NECK WITHOUT CONTRAST TECHNIQUE: Multiplanar, multiecho pulse sequences of the brain and surrounding structures were obtained without intravenous contrast. Angiographic images of the Circle of Willis were obtained using MRA technique without intravenous contrast. Angiographic images of the neck were obtained using MRA technique without intravenous contrast. Carotid stenosis measurements (when applicable) are obtained utilizing NASCET criteria, using the distal internal carotid diameter as the denominator. COMPARISON:  None Available. FINDINGS: MRI HEAD FINDINGS Brain: No acute infarct, mass effect or extra-axial collection. No acute or chronic hemorrhage. Minimal multifocal hyperintense T2-weight signal within the white matter. The midline structures are normal. Vascular: Normal flow voids. Skull and upper cervical spine: Normal calvarium and skull base. Visualized upper cervical spine and soft tissues are normal. Sinuses/Orbits:No paranasal sinus fluid levels or advanced mucosal thickening. No mastoid or middle ear effusion. Normal orbits. MRA HEAD FINDINGS POSTERIOR CIRCULATION: --Vertebral arteries: Normal --Inferior cerebellar arteries:  Normal. --Basilar artery: Normal. --Superior cerebellar arteries: Normal. --Posterior cerebral arteries: Normal. ANTERIOR CIRCULATION: --Intracranial internal carotid arteries: Normal. --Anterior cerebral arteries (ACA): Normal. --Middle cerebral arteries (MCA): Normal. MRA NECK FINDINGS There are areas of limited visualization due to time-of-flight technique, but the visible portions of the vertebral and carotid arteries are normal. There is no stenosis of the carotid bifurcations. The  vertebral system is codominant. IMPRESSION: 1. No acute intracranial abnormality. 2. Normal intracranial MRA. 3. Normal MRA of the neck. Electronically Signed   By: Deatra Robinson M.D.   On: 06/09/2023 00:55   CT HEAD CODE STROKE WO CONTRAST  Result Date: 06/08/2023 CLINICAL DATA:  Code stroke.  Neuro deficit, acute, stroke suspected EXAM: CT HEAD WITHOUT CONTRAST TECHNIQUE: Contiguous axial images were obtained from the base of the skull through the vertex without intravenous contrast. RADIATION DOSE REDUCTION: This exam was performed according to the departmental dose-optimization program which includes automated exposure control, adjustment of the mA and/or kV according to patient size and/or use of iterative reconstruction technique. COMPARISON:  MRI head November 23, 21. FINDINGS: Brain: No evidence of acute large vascular territory infarction, hemorrhage, hydrocephalus, extra-axial collection or mass lesion/mass effect. Vascular: No hyperdense vessel. Skull: No acute fracture. Sinuses/Orbits: Clear stenosis.  No acute orbital findings. Other: No mastoid effusions. ASPECTS [facility] Stroke Program Early CT Score) total score (0-10 with 10 being normal): 10. IMPRESSION: 1. No evidence of acute large vascular territory infarct or acute hemorrhage. 2. ASPECTS is 10. Electronically Signed   By: Feliberto Harts M.D.   On: 06/08/2023 20:53    EKG: My personal interpretation of EKG shows:  EKG showed sinus tachycardia heart  rate 102.  There is no ST-T wave abnormality.   Assessment/Plan: Principal Problem:   TIA (transient ischemic attack) Active Problems:   Transient hypotension   Difficulty comprehending speech   Fibromyalgia   Depression   History of migraine    Assessment and Plan: Transient ischemic attack Slurred speech/difficult comprehensible speech-resolved -Patient presenting with complaining of left-sided facial droop, shattering speech and word finding difficulty but has been resolved.  Code stroke activated in the ED and neurology already has been consulted and evaluated.  CT head, MRI of the head and MRI of the head and neck unremarkable for any acute finding. - CT head without contrast no evidence of acute large vascular territory infarction or acute hemorrhage. - MRI of the brain and MRA of the brain normal findings. -Per neurology recommendation starting aspirin 81 mg daily. -Increasing Crestor 20 to to 40 mg daily. - Fasting lipid and A1c ordered. -Obtaining echocardiogram. -Continue permissive hypertension for next 24-hour blood pressure>210/120 per neurology recommendations.  Continue hydralazine 10 mg q6hrs prn for SBP>210 or DBP>120 - Continue neurocheck every 4 hour, fall precaution, aspiration precaution and head of the bed rise> 30 degree angle. -Patient passed bedside swallow screen.  Diet ordered.   -Consulted PT/OT and SLP.   -Continue daily monitoring to monitor for arrhythmia    Transient hypotension-improving -In the ED patient's blood pressure dropped to 95/57 otherwise blood pressure stable however borderline lower range. Giving 500 mL of LR bolus and continue LR 75 cc/h.  Encouraging patient to increase oral fluid intake.   Hyperlipidemia - Continue Crestor.  History of chronic migraine -Patient is complaining about some occipital area headache.  Denies any blurry vision or nausea. - Resumed Topamax 100 mg daily. - Patient is allergic to acetaminophen.   Continue ibuprofen as needed  Depressive disorder -Continue Cymbalta 30 mg daily.  History of fibromyalgia - Not currently on any home medication except Mobic as needed.  DVT prophylaxis:  Lovenox Code Status:  Full Code Diet: Heart healthy diet. Family Communication: Patient's husband was present at bedside, at the time of interview.  Opportunity was given to ask question and all questions were answered satisfactorily.  Disposition Plan: If echocardiogram normal finding  and after PT and OT evaluation patient can be discharged to home later today. Consults: PT and OT Admission status:   Observation, Telemetry bed  Severity of Illness: The appropriate patient status for this patient is OBSERVATION. Observation status is judged to be reasonable and necessary in order to provide the required intensity of service to ensure the patient's safety. The patient's presenting symptoms, physical exam findings, and initial radiographic and laboratory data in the context of their medical condition is felt to place them at decreased risk for further clinical deterioration. Furthermore, it is anticipated that the patient will be medically stable for discharge from the hospital within 2 midnights of admission.     Tereasa Coop, MD [facility]  How to contact the [facility] Attending or Consulting provider 7A - 7P or covering provider during after hours 7P -7A, for this patient.  Check the care team in [facility] and look for a) attending/consulting [facility] provider listed and b) the [facility] team listed Log into [email or web address] and use [place]'s universal password to access. If you do not have the password, please contact the hospital operator. Locate the [facility] provider you are looking for under [facility] and page to a number that you can be directly reached. If you still have difficulty reaching the provider, please page the [facility] (Director on Call) for the Hospitalists listed on amion for  assistance.  06/09/2023, 2:58 AM

## 2023-06-09 NOTE — Discharge Summary (Signed)
Physician Discharge Summary  Martha Moss YNW:[id number] DOB: 1966-07-08 DOA: 06/08/2023  PCP: Sheliah Hatch, MD  Admit date: 06/08/2023  Discharge date: 06/09/2023  Admitted From: Home  Disposition:  Home.  Recommendations for Outpatient Follow-up:  Follow up with PCP in 1-2 weeks. Please obtain BMP/CBC in one week. Advised to follow-up with the Neurologist Dr. Sherryll Burger in 4 weeks. Advised to take Crestor 40 mg daily.  Stroke workup has been complete and normal. Advise Zio patch monitoring for 14 days before discharge.  Home Health:None Equipment/Devices:None  Discharge Condition: Stable CODE STATUS:Full code Diet recommendation: Heart Healthy  Brief [facility] Course: This 57 yrs old female with PMH significant for depression, fibromyalgia, migraine, history of seizure spell 3 years ago versus TIA complicated by migraine presented to ED for evaluation for acute onset of speech difficulty. Patient reported shattering his speech and not acting right by husband.  Husband stated that patient also developed left-sided facial droop as well. Husband reported that similar incident happened on Saturday and resolved on its own.  Patient is currently alert and oriented and able to answer all the questions correctly. However she has low speech with apparent difficulty getting the words out.  Patient presented in the ED as a stroke code, was evaluated by neurologist Dr. Otelia Limes who has recommended MRI,  MRA head and neck and complete stroke workup.  CVA workup is complete.  MRI showed No acute abnormality found.  MRI head and neck no acute abnormality.  Neurologist recommended aspirin and Lipitor. Neurologist signed off,  recommended patient can be discharged.  Family has requested second opinion from different neurologist.  Patient was evaluated by Dr. Melynda Ripple , who has recommended to continue aspirin and Crestor. Patient needs Zio patch monitoring at discharge.  Patient is being  discharged home. All questions answered.   Discharge Diagnoses:  Principal Problem:   TIA (transient ischemic attack) Active Problems:   Transient hypotension   Difficulty comprehending speech   Fibromyalgia   Depression   History of migraine  Discharge Instructions  Discharge Instructions     Call MD for:  difficulty breathing, headache or visual disturbances   Complete by: As directed    Call MD for:  persistant dizziness or light-headedness   Complete by: As directed    Call MD for:  persistant nausea and vomiting   Complete by: As directed    Diet - low sodium heart healthy   Complete by: As directed    Diet Carb Modified   Complete by: As directed    Discharge instructions   Complete by: As directed    Advised to follow-up with primary care physician in 1 week. Advised to follow-up with the [facility] in 4 weeks. Advised to take aspirin and Crestor 40 mg daily.  Stroke workup has been complete and normal.   Increase activity slowly   Complete by: As directed       Allergies as of 06/09/2023       Reactions   Bee Venom Anaphylaxis   Azithromycin    REACTION: hives   Diphenhydramine Hcl    REACTION: hives   Erythromycin    REACTION: hives   Morphine And Codeine Nausea And Vomiting   Penicillins    REACTION: rash, fever   Percocet [oxycodone-acetaminophen] Other (See Comments)   Hallucinations   Sulfonamide Derivatives    REACTION: rash, fever   Latex Swelling, Rash   Face and tongue swelling   Tetracyclines & Related Rash  fever        Medication List     TAKE these medications    aspirin 81 MG chewable tablet Chew 1 tablet (81 mg total) by mouth daily. Start taking on: June 10, 2023   DULoxetine 30 MG capsule Commonly known as: Cymbalta Take 1 capsule (30 mg total) by mouth daily.   famotidine 20 MG tablet Commonly known as: PEPCID Take 20 mg by mouth in the morning and at bedtime. Taking once daily, patient  does not recall the dose   fexofenadine 180 MG tablet Commonly known as: ALLEGRA Take 180 mg by mouth as directed. Monday Wednesday and Friday   levocetirizine 5 MG tablet Commonly known as: XYZAL Take 5 mg by mouth as directed. Tuesday Thursday Saturday and Sunday   magnesium 30 MG tablet Take 30 mg by mouth every other day.   meloxicam 15 MG tablet Commonly known as: MOBIC Take 15 mg by mouth daily.   nitrofurantoin (macrocrystal-monohydrate) 100 MG capsule Commonly known as: Macrobid Take 1 capsule (100 mg total) by mouth at bedtime.   pantoprazole 40 MG tablet Commonly known as: PROTONIX Take 1 tablet (40 mg total) by mouth daily. Please call [phone] to schedule an office visit for more refills.   rosuvastatin 40 MG tablet Commonly known as: Crestor Take 1 tablet (40 mg total) by mouth daily. Start taking on: June 10, 2023 What changed:  medication strength how much to take when to take this   topiramate 100 MG tablet Commonly known as: TOPAMAX Take 100 mg by mouth 2 (two) times daily.   traZODone 50 MG tablet Commonly known as: DESYREL Take 0.5-1 tablets (25-50 mg total) by mouth at bedtime as needed for sleep.   VITAMIN D PO Take 1,000 Units by mouth daily.   vitamin E 1000 UNIT capsule Take 1,000 Units by mouth daily.        Follow-up Information     Tabori, Katherine E, MD Follow up in 1 week(s).   Specialty: Family Medicine Contact information: [address] [place] [address] [phone]         [facility] Follow up in 4 week(s).   Contact information: [address] [place] [address] [phone]                Allergies  Allergen Reactions   Bee Venom Anaphylaxis   Azithromycin     REACTION: hives   Diphenhydramine Hcl     REACTION: hives   Erythromycin     REACTION: hives   Morphine And Codeine Nausea And Vomiting   Penicillins     REACTION: rash, fever   Percocet  [Oxycodone-Acetaminophen] Other (See Comments)    Hallucinations    Sulfonamide Derivatives     REACTION: rash, fever   Latex Swelling and Rash    Face and tongue swelling   Tetracyclines & Related Rash    fever    Consultations: Neurology   Procedures/Studies: ECHOCARDIOGRAM COMPLETE  Result Date: 06/09/2023    ECHOCARDIOGRAM REPORT   Patient Name:   Jamielee L Rappaport Date of Exam: 06/09/2023 Medical Rec #:  [id number]            Height:       61.3 in Accession #:    [id number]           Weight:       15 0.6 lb Date of Birth:  Sep 04, 1965  BSA:          1.680 m Patient Age:    57 years             BP:           124/52 mmHg Patient Gender: F                    HR:           90 bpm. Exam Location:  Inpatient Procedure: 2D Echo, Cardiac Doppler and Color Doppler Indications:    TIA  History:        Patient has prior history of Echocardiogram examinations, most                 recent 07/04/2020. Risk Factors:Hypertension.  Sonographer:    Karma Ganja Referring Phys: [id number] SUBRINA SUNDIL  Sonographer Comments: Image acquisition challenging due to breast implants. IMPRESSIONS  1. Left ventricular ejection fraction, by estimation, is 60 to 65%. The left ventricle has normal function. The left ventricle has no regional wall motion abnormalities. Left ventricular diastolic parameters were normal.  2. Right ventricular systolic function is normal. The right ventricular size is normal.  3. The mitral valve is normal in structure. No evidence of mitral valve regurgitation. No evidence of mitral stenosis.  4. The aortic valve is normal in structure. Aortic valve regurgitation is not visualized. No aortic stenosis is present.  5. The inferior vena cava is normal in size with greater than 50% respiratory variability, suggesting right atrial pressure of 3 mmHg. Conclusion(s)/Recommendation(s): No intracardiac source of embolism detected on this transthoracic study. Consider a transesophageal  echocardiogram to exclude cardiac source of embolism if clinically indicated. FINDINGS  Left Ventricle: Left ventricular ejection fraction, by estimation, is 60 to 65%. The left ventricle has normal function. The left ventricle has no regional wall motion abnormalities. The left ventricular internal cavity size was normal in size. There is  no left ventricular hypertrophy. Left ventricular diastolic parameters were normal. Normal left ventricular filling pressure. Right Ventricle: The right ventricular size is normal. No increase in right ventricular wall thickness. Right ventricular systolic function is normal. Left Atrium: Left atrial size was normal in size. Right Atrium: Right atrial size was normal in size. Pericardium: There is no evidence of pericardial effusion. Mitral Valve: The mitral valve is normal in structure. No evidence of mitral valve regurgitation. No evidence of mitral valve stenosis. Tricuspid Valve: The tricuspid valve is normal in structure. Tricuspid valve regurgitation is not demonstrated. No evidence of tricuspid stenosis. Aortic Valve: The aortic valve is normal in structure. Aortic valve regurgitation is not visualized. No aortic stenosis is present. Aortic valve mean gradient measures 4.0 mmHg. Aortic valve peak gradient measures 6.6 mmHg. Aortic valve area, by VTI measures 2.30 cm. Pulmonic Valve: The pulmonic valve was normal in structure. Pulmonic valve regurgitation is not visualized. No evidence of pulmonic stenosis. Aorta: The aortic root is normal in size and structure. Venous: The inferior vena cava is normal in size with greater than 50% respiratory variability, suggesting right atrial pressure of 3 mmHg. IAS/Shunts: No atrial level shunt detected by color flow Doppler.  LEFT VENTRICLE PLAX 2D LVIDd:         3.70 cm   Diastology LVIDs:         1.90 cm   LV e' medial:    9.03 cm/s LV PW:         1.00 cm   LV E/e' medial:  8.9 LV  IVS:        0.80 cm   LV e' lateral:   8.27 cm/s  LVOT diam:     1.90 cm   LV E/e' lateral: 9.7 LV SV:         52 LV SV Index:   31 LVOT Area:     2.84 cm  RIGHT VENTRICLE             IVC RV Basal diam:  3.10 cm     IVC diam: 1.30 cm RV S prime:     19.80 cm/s TAPSE (M-mode): 2.6 cm LEFT ATRIUM             Index        RIGHT ATRIUM           Index LA diam:        3.20 cm 1.91 cm/m   RA Area:     12.50 cm LA Vol (A2C):   39.0 ml 23.22 ml/m  RA Volume:   29.40 ml  17.50 ml/m LA Vol (A4C):   39.1 ml 23.28 ml/m LA Biplane Vol: 39.4 ml 23.46 ml/m  AORTIC VALVE AV Area (Vmax):    2.35 cm AV Area (Vmean):   2.24 cm AV Area (VTI):     2.30 cm AV Vmax:           128.00 cm/s AV Vmean:          88.200 cm/s AV VTI:            0.226 m AV Peak Grad:      6.6 mmHg AV Mean Grad:      4.0 mmHg LVOT Vmax:         106.00 cm/s LVOT Vmean:        69.700 cm/s LVOT VTI:          0.183 m LVOT/AV VTI ratio: 0.81  AORTA Ao Root diam: 2.80 cm Ao Asc diam:  2.60 cm MITRAL VALVE MV Area (PHT): 4.21 cm    SHUNTS MV Decel Time: 180 msec    Systemic VTI:  0.18 m MV E velocity: 80.50 cm/s  Systemic Diam: 1.90 cm MV A velocity: 71.70 cm/s MV E/A ratio:  1.12 Armanda Magic MD Electronically signed by Armanda Magic MD Signature Date/Time: 06/09/2023/10:31:12 AM    Final    MR BRAIN WO CONTRAST  Result Date: 06/09/2023 CLINICAL DATA:  Acute neurologic deficit EXAM: MRI HEAD WITHOUT CONTRAST MRA HEAD WITHOUT CONTRAST MRA NECK WITHOUT CONTRAST TECHNIQUE: Multiplanar, multiecho pulse sequences of the brain and surrounding structures were obtained without intravenous contrast. Angiographic images of the Circle of Willis were obtained using MRA technique without intravenous contrast. Angiographic images of the neck were obtained using MRA technique without intravenous contrast. Carotid stenosis measurements (when applicable) are obtained utilizing NASCET criteria, using the distal internal carotid diameter as the denominator. COMPARISON:  None Available. FINDINGS: MRI HEAD FINDINGS Brain: No  acute infarct, mass effect or extra-axial collection. No acute or chronic hemorrhage. Minimal multifocal hyperintense T2-weight signal within the white matter. The midline structures are normal. Vascular: Normal flow voids. Skull and upper cervical spine: Normal calvarium and skull base. Visualized upper cervical spine and soft tissues are normal. Sinuses/Orbits:No paranasal sinus fluid levels or advanced mucosal thickening. No mastoid or middle ear effusion. Normal orbits. MRA HEAD FINDINGS POSTERIOR CIRCULATION: --Vertebral arteries: Normal --Inferior cerebellar arteries: Normal. --Basilar artery: Normal. --Superior cerebellar arteries: Normal. --Posterior cerebral arteries: Normal. ANTERIOR CIRCULATION: --Intracranial internal carotid arteries: Normal. --Anterior cerebral  arteries (ACA): Normal. --Middle cerebral arteries (MCA): Normal. MRA NECK FINDINGS There are areas of limited visualization due to time-of-flight technique, but the visible portions of the vertebral and carotid arteries are normal. There is no stenosis of the carotid bifurcations. The vertebral system is codominant. IMPRESSION: 1. No acute intracranial abnormality. 2. Normal intracranial MRA. 3. Normal MRA of the neck. Electronically Signed   By: Deatra Robinson M.D.   On: 06/09/2023 00:55   MR ANGIO HEAD WO CONTRAST  Result Date: 06/09/2023 CLINICAL DATA:  Acute neurologic deficit EXAM: MRI HEAD WITHOUT CONTRAST MRA HEAD WITHOUT CONTRAST MRA NECK WITHOUT CONTRAST TECHNIQUE: Multiplanar, multiecho pulse sequences of the brain and surrounding structures were obtained without intravenous contrast. Angiographic images of the Circle of Willis were obtained using MRA technique without intravenous contrast. Angiographic images of the neck were obtained using MRA technique without intravenous contrast. Carotid stenosis measurements (when applicable) are obtained utilizing NASCET criteria, using the distal internal carotid diameter as the  denominator. COMPARISON:  None Available. FINDINGS: MRI HEAD FINDINGS Brain: No acute infarct, mass effect or extra-axial collection. No acute or chronic hemorrhage. Minimal multifocal hyperintense T2-weight signal within the white matter. The midline structures are normal. Vascular: Normal flow voids. Skull and upper cervical spine: Normal calvarium and skull base. Visualized upper cervical spine and soft tissues are normal. Sinuses/Orbits:No paranasal sinus fluid levels or advanced mucosal thickening. No mastoid or middle ear effusion. Normal orbits. MRA HEAD FINDINGS POSTERIOR CIRCULATION: --Vertebral arteries: Normal --Inferior cerebellar arteries: Normal. --Basilar artery: Normal. --Superior cerebellar arteries: Normal. --Posterior cerebral arteries: Normal. ANTERIOR CIRCULATION: --Intracranial internal carotid arteries: Normal. --Anterior cerebral arteries (ACA): Normal. --Middle cerebral arteries (MCA): Normal. MRA NECK FINDINGS There are areas of limited visualization due to time-of-flight technique, but the visible portions of the vertebral and carotid arteries are normal. There is no stenosis of the carotid bifurcations. The vertebral system is codominant. IMPRESSION: 1. No acute intracranial abnormality. 2. Normal intracranial MRA. 3. Normal MRA of the neck. Electronically Signed   By: Deatra Robinson M.D.   On: 06/09/2023 00:55   MR ANGIO NECK WO CONTRAST  Result Date: 06/09/2023 CLINICAL DATA:  Acute neurologic deficit EXAM: MRI HEAD WITHOUT CONTRAST MRA HEAD WITHOUT CONTRAST MRA NECK WITHOUT CONTRAST TECHNIQUE: Multiplanar, multiecho pulse sequences of the brain and surrounding structures were obtained without intravenous contrast. Angiographic images of the Circle of Willis were obtained using MRA technique without intravenous contrast. Angiographic images of the neck were obtained using MRA technique without intravenous contrast. Carotid stenosis measurements (when applicable) are obtained  utilizing NASCET criteria, using the distal internal carotid diameter as the denominator. COMPARISON:  None Available. FINDINGS: MRI HEAD FINDINGS Brain: No acute infarct, mass effect or extra-axial collection. No acute or chronic hemorrhage. Minimal multifocal hyperintense T2-weight signal within the white matter. The midline structures are normal. Vascular: Normal flow voids. Skull and upper cervical spine: Normal calvarium and skull base. Visualized upper cervical spine and soft tissues are normal. Sinuses/Orbits:No paranasal sinus fluid levels or advanced mucosal thickening. No mastoid or middle ear effusion. Normal orbits. MRA HEAD FINDINGS POSTERIOR CIRCULATION: --Vertebral arteries: Normal --Inferior cerebellar arteries: Normal. --Basilar artery: Normal. --Superior cerebellar arteries: Normal. --Posterior cerebral arteries: Normal. ANTERIOR CIRCULATION: --Intracranial internal carotid arteries: Normal. --Anterior cerebral arteries (ACA): Normal. --Middle cerebral arteries (MCA): Normal. MRA NECK FINDINGS There are areas of limited visualization due to time-of-flight technique, but the visible portions of the vertebral and carotid arteries are normal. There is no stenosis of the carotid bifurcations. The vertebral system  is codominant. IMPRESSION: 1. No acute intracranial abnormality. 2. Normal intracranial MRA. 3. Normal MRA of the neck. Electronically Signed   By: Deatra Robinson M.D.   On: 06/09/2023 00:55   CT HEAD CODE STROKE WO CONTRAST  Result Date: 06/08/2023 CLINICAL DATA:  Code stroke.  Neuro deficit, acute, stroke suspected EXAM: CT HEAD WITHOUT CONTRAST TECHNIQUE: Contiguous axial images were obtained from the base of the skull through the vertex without intravenous contrast. RADIATION DOSE REDUCTION: This exam was performed according to the departmental dose-optimization program which includes automated exposure control, adjustment of the mA and/or kV according to patient size and/or use of  iterative reconstruction technique. COMPARISON:  MRI head November 23, 21. FINDINGS: Brain: No evidence of acute large vascular territory infarction, hemorrhage, hydrocephalus, extra-axial collection or mass lesion/mass effect. Vascular: No hyperdense vessel. Skull: No acute fracture. Sinuses/Orbits: Clear stenosis.  No acute orbital findings. Other: No mastoid effusions. ASPECTS [facility] Stroke Program Early CT Score) total score (0-10 with 10 being normal): 10. IMPRESSION: 1. No evidence of acute large vascular territory infarct or acute hemorrhage. 2. ASPECTS is 10. Electronically Signed   By: Feliberto Harts M.D.   On: 06/08/2023 20:53     Subjective: Patient was seen and examined at bedside.  Overnight events noted.   Patient reports doing much better.  Workup is complete. Patient will get Zio patch monitoring before discharge.  Discharge Exam: Vitals:   06/09/23 0744 06/09/23 1209  BP: 110/65 106/62  Pulse: 70 68  Resp: 16 17  Temp: 97.9 F (36.6 C) 98 F (36.7 C)  SpO2: 99% 99%   Vitals:   06/09/23 0600 06/09/23 0631 06/09/23 0744 06/09/23 1209  BP: 108/66 (!) 124/52 110/65 106/62  Pulse: 66 79 70 68  Resp: 19 16 16 17   Temp:  99.1 F (37.3 C) 97.9 F (36.6 C) 98 F (36.7 C)  TempSrc:  Oral Oral Oral  SpO2: 98% 98% 99% 99%  Weight:      Height:        General: Pt is alert, awake, not in acute distress Cardiovascular: RRR, S1/S2 +, no rubs, no gallops Respiratory: CTA bilaterally, no wheezing, no rhonchi Abdominal: Soft, NT, ND, bowel sounds + Extremities: no edema, no cyanosis    The results of significant diagnostics from this hospitalization (including imaging, microbiology, ancillary and laboratory) are listed below for reference.     Microbiology: No results found for this or any previous visit (from the past 240 hour(s)).   Labs: BNP (last 3 results) No results for input(s): "BNP" in the last 8760 hours. Basic Metabolic Panel: Recent Labs  Lab  06/08/23 2041 06/08/23 2043 06/09/23 0411  NA 142 143 141  K 3.7 3.6 3.7  CL 110 109 112*  CO2 23  --  23  GLUCOSE 105* 102* 108*  BUN 19 20 21*  CREATININE 0.99 1.00 0.92  CALCIUM 9.4  --  8.6*   Liver Function Tests: Recent Labs  Lab 06/08/23 2041 06/09/23 0411  AST 22 16  ALT 12 14  ALKPHOS 70 58  BILITOT 0.3 0.3  PROT 7.6 5.6*  ALBUMIN 4.2 3.1*   No results for input(s): "LIPASE", "AMYLASE" in the last 168 hours. No results for input(s): "AMMONIA" in the last 168 hours. CBC: Recent Labs  Lab 06/08/23 2041 06/08/23 2043 06/09/23 0411  WBC 8.0  --  7.4  NEUTROABS 3.1  --   --   HGB 13.1 13.6 10.3*  HCT 39.9 40.0 31.2*  MCV  93.7  --  95.1  PLT 274  --  240   Cardiac Enzymes: No results for input(s): "CKTOTAL", "CKMB", "CKMBINDEX", "TROPONINI" in the last 168 hours. BNP: Invalid input(s): "POCBNP" CBG: Recent Labs  Lab 06/08/23 2032  GLUCAP 88   D-Dimer No results for input(s): "DDIMER" in the last 72 hours. Hgb A1c Recent Labs    06/09/23 0411  HGBA1C 5.4   Lipid Profile Recent Labs    06/09/23 0411  CHOL 195  HDL 46  LDLCALC 118*  TRIG 153*  CHOLHDL 4.2   Thyroid function studies No results for input(s): "TSH", "T4TOTAL", "T3FREE", "THYROIDAB" in the last 72 hours.  Invalid input(s): "FREET3" Anemia work up No results for input(s): "VITAMINB12", "FOLATE", "FERRITIN", "TIBC", "IRON", "RETICCTPCT" in the last 72 hours. Urinalysis    Component Value Date/Time   COLORURINE YELLOW 08/23/2017 1140   APPEARANCEUR CLEAR 08/23/2017 1140   LABSPEC 1.025 08/23/2017 1140   PHURINE 6.5 08/23/2017 1140   GLUCOSEU NEGATIVE 08/23/2017 1140   HGBUR NEGATIVE 08/23/2017 1140   HGBUR negative 10/26/2009 1516   BILIRUBINUR negative 11/04/2022 0851   BILIRUBINUR negative 08/27/2017 1127   KETONESUR negative 11/04/2022 0851   KETONESUR NEGATIVE 08/23/2017 1140   PROTEINUR =100 (A) 11/04/2022 0851   PROTEINUR Negative 01/27/2022 1517   PROTEINUR  NEGATIVE 08/23/2017 1140   UROBILINOGEN 2.0 (A) 11/04/2022 0851   UROBILINOGEN 0.2 03/05/2015 1402   NITRITE Positive (A) 11/04/2022 0851   NITRITE negative 08/27/2017 1127   NITRITE NEGATIVE 08/23/2017 1140   LEUKOCYTESUR Large (3+) (A) 11/04/2022 0851   Sepsis Labs Recent Labs  Lab 06/08/23 2041 06/09/23 0411  WBC 8.0 7.4   Microbiology No results found for this or any previous visit (from the past 240 hour(s)).   Time coordinating discharge: Over 30 minutes  SIGNED:   Willeen Niece, MD  [facility] 06/09/2023, 2:27 PM Pager   If 7PM-7AM, please contact night-coverage

## 2023-06-09 NOTE — Evaluation (Signed)
Occupational Therapy Evaluation and Discharge Patient Details Name: Martha Moss MRN: [id number] DOB: [birth year] Today's Date: 06/09/2023   History of Present Illness Martha Moss is a 57 y.o. female presented to emergency department for evaluation for acute onset of speech difficulty and facial droop. DDx for presentation includes functional/non-physiological neurological symptoms versus a small stroke or TIA--CT/MRI negative. PHMx: depression, fibromyalgia, migraine, history of seizure spell 3 years ago versus TIA complicated by migraine.   Clinical Impression   This 57 yo female admitted with above presents to acute OT at an overall Mod I level due to increased time over her baseline for ambulation and ADLs due to feeling weak on left side. No further skilled OT needs identified post this session, we will sign off.       If plan is discharge home, recommend the following: Assistance with cooking/housework;Assist for transportation    Functional Status Assessment  Patient has had a recent decline in their functional status and demonstrates the ability to make significant improvements in function in a reasonable and predictable amount of time. (but not affecting functional tasks other than increased time)  Equipment Recommendations  None recommended by OT       Precautions / Restrictions Precautions Precautions: Fall Restrictions Weight Bearing Restrictions: No      Mobility Bed Mobility Overal bed mobility: Modified Independent             General bed mobility comments: HOB slightly elevated no use of rails.    Transfers Overall transfer level: Modified independent Equipment used: None                      Balance Overall balance assessment: Mild deficits observed, not formally tested (pt report she feels week on left side)                                         ADL either performed or assessed with clinical judgement    ADL Overall ADL's : Modified independent                                       General ADL Comments: Increased time to pt feels weak     Vision Baseline Vision/History: 1 Wears glasses Ability to See in Adequate Light: 0 Adequate Patient Visual Report: No change from baseline Vision Assessment?: Yes Eye Alignment: Within Functional Limits Ocular Range of Motion: Within Functional Limits Alignment/Gaze Preference: Within Defined Limits Tracking/Visual Pursuits: Able to track stimulus in all quads without difficulty Saccades: Within functional limits Visual Fields: No apparent deficits            Pertinent Vitals/Pain Pain Assessment Pain Assessment: No/denies pain     Extremity/Trunk Assessment Upper Extremity Assessment Upper Extremity Assessment: Right hand dominant;LUE deficits/detail LUE Deficits / Details: mild dysmetria for finger to nose with eyes shut LUE Sensation: decreased proprioception (when eyes are shut) LUE Coordination: decreased gross motor           Communication Communication Communication: No apparent difficulties   Cognition Arousal: Alert Behavior During Therapy: WFL for tasks assessed/performed Overall Cognitive Status: Within Functional Limits for tasks assessed  General Comments  Dtr present throughout session; husband present towards end of session. In single lim stance you can tell that she is a little more weaker on the left (more lateral lean for balance)    Exercises Other Exercises Other Exercises: Educated pt and family (dtr and husband) on FM/GM activies for LUE and handouts on these also provided as well as red theraputty.   Shoulder Instructions      Home Living Family/patient expects to be discharged to:: Private residence Living Arrangements: Spouse/significant other Available Help at Discharge: Friend(s);Available 24 hours/day Type of Home:  House Home Access: Stairs to enter [employer] of Steps: 5 Entrance Stairs-Rails: Right Home Layout: One level     Bathroom Shower/Tub: Chief Strategy Officer: Standard     Home Equipment: Cane - single point   Additional Comments: pt reports 2 falls in 6 months. She has as SPC but does not use it.      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        OT Problem List: Decreased strength;Impaired balance (sitting and/or standing);Decreased coordination         OT Goals(Current goals can be found in the care plan section) Acute Rehab OT Goals Patient Stated Goal: to figure out what is going on         AM-PAC OT "6 Clicks" Daily Activity     Outcome Measure Help from another person eating meals?: None Help from another person taking care of personal grooming?: None Help from another person toileting, which includes using toliet, bedpan, or urinal?: None Help from another person bathing (including washing, rinsing, drying)?: None Help from another person to put on and taking off regular upper body clothing?: None Help from another person to put on and taking off regular lower body clothing?: None 6 Click Score: 24   End of Session    Activity Tolerance: Patient tolerated treatment well Patient left: in bed  OT Visit Diagnosis: Other abnormalities of gait and mobility (R26.89);Muscle weakness (generalized) (M62.81)                Time: 5573-2202 OT Time Calculation (min): 36 min Charges:  OT General Charges $OT Visit: 1 Visit OT Evaluation $OT Eval Moderate Complexity: 1 Mod OT Treatments $Self Care/Home Management : 8-22 mins Lindon Romp OT Acute Rehabilitation Services Office [phone]    Evette Georges 06/09/2023, 9:40 PM

## 2023-06-09 NOTE — Progress Notes (Addendum)
STROKE TEAM PROGRESS NOTE   BRIEF HPI [name] is a 57 y.o. female with history of depression, fibromyalgia, migraine headaches and a spell 3 years ago of seizure versus TIA versus complicated migraine  presenting with acute onset of stuttering speech and "not acting right", also reporting L facial droop. Pt symptoms totally resolved.    SIGNIFICANT HOSPITAL EVENTS 10/28 - Admitted to ED. MRI, CT Head, CTA all negative for acute pathology.  INTERIM HISTORY/SUBJECTIVE Met patient bedside in progressive unit with attending Dr Roda Shutters. Pt's two daughters were also present. Pt presented history as above. Symptoms have reportedly resolved since coming into the hospital. Pt family believes that the patient has some vascular blockage responsible for the symptoms. Told them that the scans done in the hospital do not support this conclusion.   Pt reports increased stress over the past few months associated with her husband's health issues and the challenges associated with managing affairs. Daughters interrupted pt several times to ask whether the team thought that stress might be a component for the patient's symptoms.   When discussing the possible differential diagnoses including functional neurological deficit, family members became agitated. Patient did not participate actively in this discussion.   OBJECTIVE  CBC    Component Value Date/Time   WBC 7.4 06/09/2023 0411   RBC 3.28 (L) 06/09/2023 0411   HGB 10.3 (L) 06/09/2023 0411   HCT 31.2 (L) 06/09/2023 0411   PLT 240 06/09/2023 0411   MCV 95.1 06/09/2023 0411   MCH 31.4 06/09/2023 0411   MCHC 33.0 06/09/2023 0411   RDW 12.7 06/09/2023 0411   LYMPHSABS 4.0 06/08/2023 2041   MONOABS 0.6 06/08/2023 2041   EOSABS 0.2 06/08/2023 2041   BASOSABS 0.1 06/08/2023 2041    BMET    Component Value Date/Time   NA 141 06/09/2023 0411   K 3.7 06/09/2023 0411   CL 112 (H) 06/09/2023 0411   CO2 23 06/09/2023 0411   GLUCOSE 108 (H)  06/09/2023 0411   BUN 21 (H) 06/09/2023 0411   CREATININE 0.92 06/09/2023 0411   CREATININE 0.77 09/25/2016 1544   CALCIUM 8.6 (L) 06/09/2023 0411   GFRNONAA >60 06/09/2023 0411    IMAGING past 24 hours ECHOCARDIOGRAM COMPLETE  Result Date: 06/09/2023    ECHOCARDIOGRAM REPORT   Patient Name:   Martha Moss Date of Exam: 06/09/2023 Medical Rec #:  [id number]            Height:       61.3 in Accession #:    [id number]           Weight:       150.6 lb Date of Birth:  04/06/1966             BSA:          1.680 m Patient Age:    57 years             BP:           124/52 mmHg Patient Gender: F                    HR:           90 bpm. Exam Location:  Inpatient Procedure: 2D Echo, Cardiac Doppler and Color Doppler Indications:    TIA  History:        Patient has prior history of Echocardiogram examinations, most  recent 07/04/2020. Risk Factors:Hypertension.  Sonographer:    Karma Ganja Referring Phys: [id number] SUBRINA SUNDIL  Sonographer Comments: Image acquisition challenging due to breast implants. IMPRESSIONS  1. Left ventricular ejection fraction, by estimation, is 60 to 65%. The left ventricle has normal function. The left ventricle has no regional wall motion abnormalities. Left ventricular diastolic parameters were normal.  2. Right ventricular systolic function is normal. The right ventricular size is normal.  3. The mitral valve is normal in structure. No evidence of mitral valve regurgitation. No evidence of mitral stenosis.  4. The aortic valve is normal in structure. Aortic valve regurgitation is not visualized. No aortic stenosis is present.  5. The inferior vena cava is normal in size with greater than 50% respiratory variability, suggesting right atrial pressure of 3 mmHg. Conclusion(s)/Recommendation(s): No intracardiac source of embolism detected on this transthoracic study. Consider a transesophageal echocardiogram to exclude cardiac source of embolism if clinically  indicated. FINDINGS  Left Ventricle: Left ventricular ejection fraction, by estimation, is 60 to 65%. The left ventricle has normal function. The left ventricle has no regional wall motion abnormalities. The left ventricular internal cavity size was normal in size. There is  no left ventricular hypertrophy. Left ventricular diastolic parameters were normal. Normal left ventricular filling pressure. Right Ventricle: The right ventricular size is normal. No increase in right ventricular wall thickness. Right ventricular systolic function is normal. Left Atrium: Left atrial size was normal in size. Right Atrium: Right atrial size was normal in size. Pericardium: There is no evidence of pericardial effusion. Mitral Valve: The mitral valve is normal in structure. No evidence of mitral valve regurgitation. No evidence of mitral valve stenosis. Tricuspid Valve: The tricuspid valve is normal in structure. Tricuspid valve regurgitation is not demonstrated. No evidence of tricuspid stenosis. Aortic Valve: The aortic valve is normal in structure. Aortic valve regurgitation is not visualized. No aortic stenosis is present. Aortic valve mean gradient measures 4.0 mmHg. Aortic valve peak gradient measures 6.6 mmHg. Aortic valve area, by VTI measures 2.30 cm. Pulmonic Valve: The pulmonic valve was normal in structure. Pulmonic valve regurgitation is not visualized. No evidence of pulmonic stenosis. Aorta: The aortic root is normal in size and structure. Venous: The inferior vena cava is normal in size with greater than 50% respiratory variability, suggesting right atrial pressure of 3 mmHg. IAS/Shunts: No atrial level shunt detected by color flow Doppler.  LEFT VENTRICLE PLAX 2D LVIDd:         3.70 cm   Diastology LVIDs:         1.90 cm   LV e' medial:    9.03 cm/s LV PW:         1.00 cm   LV E/e' medial:  8.9 LV IVS:        0.80 cm   LV e' lateral:   8.27 cm/s LVOT diam:     1.90 cm   LV E/e' lateral: 9.7 LV SV:         52 LV SV  Index:   31 LVOT Area:     2.84 cm  RIGHT VENTRICLE             IVC RV Basal diam:  3.10 cm     IVC diam: 1.30 cm RV S prime:     19.80 cm/s TAPSE (M-mode): 2.6 cm LEFT ATRIUM             Index        RIGHT ATRIUM  Index LA diam:        3.20 cm 1.91 cm/m   RA Area:     12.50 cm LA Vol (A2C):   39.0 ml 23.22 ml/m  RA Volume:   29.40 ml  17.50 ml/m LA Vol (A4C):   39.1 ml 23.28 ml/m LA Biplane Vol: 39.4 ml 23.46 ml/m  AORTIC VALVE AV Area (Vmax):    2.35 cm AV Area (Vmean):   2.24 cm AV Area (VTI):     2.30 cm AV Vmax:           128.00 cm/s AV Vmean:          88.200 cm/s AV VTI:            0.226 m AV Peak Grad:      6.6 mmHg AV Mean Grad:      4.0 mmHg LVOT Vmax:         106.00 cm/s LVOT Vmean:        69.700 cm/s LVOT VTI:          0.183 m LVOT/AV VTI ratio: 0.81  AORTA Ao Root diam: 2.80 cm Ao Asc diam:  2.60 cm MITRAL VALVE MV Area (PHT): 4.21 cm    SHUNTS MV Decel Time: 180 msec    Systemic VTI:  0.18 m MV E velocity: 80.50 cm/s  Systemic Diam: 1.90 cm MV A velocity: 71.70 cm/s MV E/A ratio:  1.12 Armanda Magic MD Electronically signed by Armanda Magic MD Signature Date/Time: 06/09/2023/10:31:12 AM    Final    MR BRAIN WO CONTRAST  Result Date: 06/09/2023 CLINICAL DATA:  Acute neurologic deficit EXAM: MRI HEAD WITHOUT CONTRAST MRA HEAD WITHOUT CONTRAST MRA NECK WITHOUT CONTRAST TECHNIQUE: Multiplanar, multiecho pulse sequences of the brain and surrounding structures were obtained without intravenous contrast. Angiographic images of the Circle of Willis were obtained using MRA technique without intravenous contrast. Angiographic images of the neck were obtained using MRA technique without intravenous contrast. Carotid stenosis measurements (when applicable) are obtained utilizing NASCET criteria, using the distal internal carotid diameter as the denominator. COMPARISON:  None Available. FINDINGS: MRI HEAD FINDINGS Brain: No acute infarct, mass effect or extra-axial collection. No acute or  chronic hemorrhage. Minimal multifocal hyperintense T2-weight signal within the white matter. The midline structures are normal. Vascular: Normal flow voids. Skull and upper cervical spine: Normal calvarium and skull base. Visualized upper cervical spine and soft tissues are normal. Sinuses/Orbits:No paranasal sinus fluid levels or advanced mucosal thickening. No mastoid or middle ear effusion. Normal orbits. MRA HEAD FINDINGS POSTERIOR CIRCULATION: --Vertebral arteries: Normal --Inferior cerebellar arteries: Normal. --Basilar artery: Normal. --Superior cerebellar arteries: Normal. --Posterior cerebral arteries: Normal. ANTERIOR CIRCULATION: --Intracranial internal carotid arteries: Normal. --Anterior cerebral arteries (ACA): Normal. --Middle cerebral arteries (MCA): Normal. MRA NECK FINDINGS There are areas of limited visualization due to time-of-flight technique, but the visible portions of the vertebral and carotid arteries are normal. There is no stenosis of the carotid bifurcations. The vertebral system is codominant. IMPRESSION: 1. No acute intracranial abnormality. 2. Normal intracranial MRA. 3. Normal MRA of the neck. Electronically Signed   By: Deatra Robinson M.D.   On: 06/09/2023 00:55   MR ANGIO HEAD WO CONTRAST  Result Date: 06/09/2023 CLINICAL DATA:  Acute neurologic deficit EXAM: MRI HEAD WITHOUT CONTRAST MRA HEAD WITHOUT CONTRAST MRA NECK WITHOUT CONTRAST TECHNIQUE: Multiplanar, multiecho pulse sequences of the brain and surrounding structures were obtained without intravenous contrast. Angiographic images of the Circle of Willis were obtained using MRA technique without intravenous  contrast. Angiographic images of the neck were obtained using MRA technique without intravenous contrast. Carotid stenosis measurements (when applicable) are obtained utilizing NASCET criteria, using the distal internal carotid diameter as the denominator. COMPARISON:  None Available. FINDINGS: MRI HEAD FINDINGS Brain:  No acute infarct, mass effect or extra-axial collection. No acute or chronic hemorrhage. Minimal multifocal hyperintense T2-weight signal within the white matter. The midline structures are normal. Vascular: Normal flow voids. Skull and upper cervical spine: Normal calvarium and skull base. Visualized upper cervical spine and soft tissues are normal. Sinuses/Orbits:No paranasal sinus fluid levels or advanced mucosal thickening. No mastoid or middle ear effusion. Normal orbits. MRA HEAD FINDINGS POSTERIOR CIRCULATION: --Vertebral arteries: Normal --Inferior cerebellar arteries: Normal. --Basilar artery: Normal. --Superior cerebellar arteries: Normal. --Posterior cerebral arteries: Normal. ANTERIOR CIRCULATION: --Intracranial internal carotid arteries: Normal. --Anterior cerebral arteries (ACA): Normal. --Middle cerebral arteries (MCA): Normal. MRA NECK FINDINGS There are areas of limited visualization due to time-of-flight technique, but the visible portions of the vertebral and carotid arteries are normal. There is no stenosis of the carotid bifurcations. The vertebral system is codominant. IMPRESSION: 1. No acute intracranial abnormality. 2. Normal intracranial MRA. 3. Normal MRA of the neck. Electronically Signed   By: Deatra Robinson M.D.   On: 06/09/2023 00:55   MR ANGIO NECK WO CONTRAST  Result Date: 06/09/2023 CLINICAL DATA:  Acute neurologic deficit EXAM: MRI HEAD WITHOUT CONTRAST MRA HEAD WITHOUT CONTRAST MRA NECK WITHOUT CONTRAST TECHNIQUE: Multiplanar, multiecho pulse sequences of the brain and surrounding structures were obtained without intravenous contrast. Angiographic images of the Circle of Willis were obtained using MRA technique without intravenous contrast. Angiographic images of the neck were obtained using MRA technique without intravenous contrast. Carotid stenosis measurements (when applicable) are obtained utilizing NASCET criteria, using the distal internal carotid diameter as the  denominator. COMPARISON:  None Available. FINDINGS: MRI HEAD FINDINGS Brain: No acute infarct, mass effect or extra-axial collection. No acute or chronic hemorrhage. Minimal multifocal hyperintense T2-weight signal within the white matter. The midline structures are normal. Vascular: Normal flow voids. Skull and upper cervical spine: Normal calvarium and skull base. Visualized upper cervical spine and soft tissues are normal. Sinuses/Orbits:No paranasal sinus fluid levels or advanced mucosal thickening. No mastoid or middle ear effusion. Normal orbits. MRA HEAD FINDINGS POSTERIOR CIRCULATION: --Vertebral arteries: Normal --Inferior cerebellar arteries: Normal. --Basilar artery: Normal. --Superior cerebellar arteries: Normal. --Posterior cerebral arteries: Normal. ANTERIOR CIRCULATION: --Intracranial internal carotid arteries: Normal. --Anterior cerebral arteries (ACA): Normal. --Middle cerebral arteries (MCA): Normal. MRA NECK FINDINGS There are areas of limited visualization due to time-of-flight technique, but the visible portions of the vertebral and carotid arteries are normal. There is no stenosis of the carotid bifurcations. The vertebral system is codominant. IMPRESSION: 1. No acute intracranial abnormality. 2. Normal intracranial MRA. 3. Normal MRA of the neck. Electronically Signed   By: Deatra Robinson M.D.   On: 06/09/2023 00:55   CT HEAD CODE STROKE WO CONTRAST  Result Date: 06/08/2023 CLINICAL DATA:  Code stroke.  Neuro deficit, acute, stroke suspected EXAM: CT HEAD WITHOUT CONTRAST TECHNIQUE: Contiguous axial images were obtained from the base of the skull through the vertex without intravenous contrast. RADIATION DOSE REDUCTION: This exam was performed according to the departmental dose-optimization program which includes automated exposure control, adjustment of the mA and/or kV according to patient size and/or use of iterative reconstruction technique. COMPARISON:  MRI head November 23, 21.  FINDINGS: Brain: No evidence of acute large vascular territory infarction, hemorrhage, hydrocephalus, extra-axial collection or mass  lesion/mass effect. Vascular: No hyperdense vessel. Skull: No acute fracture. Sinuses/Orbits: Clear stenosis.  No acute orbital findings. Other: No mastoid effusions. ASPECTS [facility] Stroke Program Early CT Score) total score (0-10 with 10 being normal): 10. IMPRESSION: 1. No evidence of acute large vascular territory infarct or acute hemorrhage. 2. ASPECTS is 10. Electronically Signed   By: Feliberto Harts M.D.   On: 06/08/2023 20:53    Vitals:   06/09/23 0600 06/09/23 0631 06/09/23 0744 06/09/23 1209  BP: 108/66 (!) 124/52 110/65 106/62  Pulse: 66 79 70 68  Resp: 19 16 16 17   Temp:  99.1 F (37.3 C) 97.9 F (36.6 C) 98 F (36.7 C)  TempSrc:  Oral Oral Oral  SpO2: 98% 98% 99% 99%  Weight:      Height:       PHYSICAL EXAM General:  Alert, well-nourished, well-developed patient in no acute distress Psych: Mood: "anxious." Affect: congruent, but patient does not seem especially stressed about the deficits associated with her "stroke like incident."  CV: Regular rate and rhythm on monitor Respiratory:  Regular, unlabored respirations on room air GI: Abdomen soft and nontender   NEURO:  Mental Status: AA&Ox3, patient is able to give clear and coherent history Speech/Language: speech is without dysarthria or aphasia.  Naming, repetition, fluency, and comprehension intact.  Cranial Nerves:  II: PERRL. Visual fields full.  III, IV, VI: EOMI. Eyelids elevate symmetrically.  V: Sensation is intact to light touch and symmetrical to face.  VII: Face is symmetrical resting and smiling VIII: hearing intact to voice and rubbed gloves on opposing ears.. IX, X: Palate elevates symmetrically. Phonation is normal.  ZO:[id number] shrug 5/5. XII: tongue is midline without fasciculations. Motor: 5/5 strength to all muscle groups tested.  Tone: is normal and bulk is  normal Sensation- Intact to light touch bilaterally.   Coordination: FTN intact bilaterally, HKS: no ataxia in BLE.No drift.  Gait- deferred (Benign neurological exam. No deficits noted).  ASSESSMENT/PLAN  Functional Neurological Disorder, DDx also include migraine equivalent Code Stroke 10/28 No evidence of acute large vascular territory infarct or acute hemorrhage. ASPECTS is 10. MRI + MRA head and neck No acute intracranial abnormality. Normal intracranial MRA. Normal MRA of the neck. 2D Echo: EF 60-65%, no abnormalities noted.  LDL 118 HgbA1c 5.4 VTE prophylaxis - lovenox No antithrombotics prior to admission, OK to continue aspirin 81 mg daily on discharge Therapy recommendations:  No follow up needed  Disposition:  Home with Dr Sherryll Burger follow up at [facility]  Hx of non-epileptic event Reported incident of a previous spell in 2021 with AMS and speech disturbance, LTM EEG confirmed a non-epileptic event.  Pt follow with Dr. Sherryll Burger and Paicin PA at [facility] clinic  Arrhythmia Reportedly afib seen by husband on telemetry monitoring overnight. EKG x 2 showed sinus tachycardia with HR 102  14 day monitoring zio patch to be placed prior to discharge by cardiology.  Hyperlipidemia Home meds: Rosuvastatin 20mg  LDL 118, goal < 70 Increased rosuvastatin 40 mg  Continue statin at discharge  Migraines  Followed with Dr. Lucia Gaskins initially, now follows with Dr. Sherryll Burger and Paicin PA in [place] On topamax 100 bid Continue home meds Follow up with Dr Sherryll Burger  Other Stroke Risk Factors Family hx stroke (Sister, Brother, father had MI)  Other Active Problems Depression Fibromyalgia GERD  Hospital day # 0  Neurology will sign off. Please call with questions. Pt will follow up with Dr. Sherryll Burger at [facility] in about 4 weeks. Thanks for  the consult.   Marvel Plan, MD PhD Stroke Neurology 06/09/2023 4:52 PM   To contact Stroke Continuity provider, please refer to [email or web address]. After  hours, contact General Neurology

## 2023-06-09 NOTE — ED Notes (Signed)
ED TO INPATIENT HANDOFF REPORT  ED Nurse Name and Phone #: Raynelle Fanning, RN  S Name/Age/Gender Martha Moss 57 y.o. female Room/Bed: 003C/003C  Code Status   Code Status: Full Code  Home/SNF/Other Home Patient oriented to: self Is this baseline? Yes      Chief Complaint TIA (transient ischemic attack) [G45.9]  Triage Note Pt presents to ED POV with complaints of slurred speech and facial droop that started at 2015. Per husband pt is not herself and had a similar incident happened on Saturday that resolved on its own. Pt is alert and oriented. Pt answer questions correctly however speech appears slow and difficult to get words out. Pt denies pain at current time but states had a headache yesterday.       Allergies Allergies  Allergen Reactions   Bee Venom Anaphylaxis   Azithromycin     REACTION: hives   Diphenhydramine Hcl     REACTION: hives   Erythromycin     REACTION: hives   Morphine And Codeine Nausea And Vomiting   Penicillins     REACTION: rash, fever   Percocet [Oxycodone-Acetaminophen] Other (See Comments)    Hallucinations    Sulfonamide Derivatives     REACTION: rash, fever   Latex Swelling and Rash    Face and tongue swelling   Tetracyclines & Related Rash    fever    Level of Care/Admitting Diagnosis ED Disposition     ED Disposition  Admit   Condition  --   Comment  Hospital Area: [facility] [[id number]]  Level of Care: Telemetry Medical [104]  May place patient in observation at [facility] or [facility] if equivalent level of care is available:: No  Covid Evaluation: Asymptomatic - no recent exposure (last 10 days) testing not required  Diagnosis: TIA (transient ischemic attack) [010272]  Admitting Physician: Tereasa Coop [[id number]]  Attending Physician: Tereasa Coop [[id number]]          B Medical/Surgery History Past Medical History:  Diagnosis Date   Depression    Fibromyalgia    GERD (gastroesophageal  reflux disease)    Migraine    PONV (postoperative nausea and vomiting)    Past Surgical History:  Procedure Laterality Date   BIOPSY  07/06/2020   Procedure: BIOPSY;  Surgeon: Shellia Cleverly, DO;  Location: [facility];  Service: Gastroenterology;;   CHOLECYSTECTOMY     ESOPHAGOGASTRODUODENOSCOPY Left 07/06/2020   Procedure: ESOPHAGOGASTRODUODENOSCOPY (EGD);  Surgeon: Shellia Cleverly, DO;  Location: [facility] END[facility]COPY;  Service: Gastroenterology;  Laterality: Left;   ETHMOIDECTOMY Bilateral 09/09/2016   Procedure: ETHMOIDECTOMY;  Surgeon: Newman Pies, MD;  Location: [facility];  Service: ENT;  Laterality: Bilateral;   FRONTAL SINUS EXPLORATION Bilateral 09/09/2016   Procedure: FRONTAL RECESS SINUS EXPLORATION;  Surgeon: Newman Pies, MD;  Location: [facility];  Service: ENT;  Laterality: Bilateral;   MALONEY DILATION  07/06/2020   Procedure: Elease Hashimoto DILATION;  Surgeon: Shellia Cleverly, DO;  Location: [facility];  Service: Gastroenterology;;   MAXILLARY ANTROSTOMY Bilateral 09/09/2016   Procedure: MAXILLARY ANTROSTOMY WITH TISSUE REMOVAL;  Surgeon: Newman Pies, MD;  Location: [facility];  Service: ENT;  Laterality: Bilateral;   OVARIAN CYST SURGERY     SINUS ENDO W/FUSION Bilateral 09/09/2016   Procedure: ENDOSCOPIC SINUS SURGERY WITH NAVIGATION;  Surgeon: Newman Pies, MD;  Location: [facility];  Service: ENT;  Laterality: Bilateral;   TOTAL VAGINAL HYSTERECTOMY     TURBINATE REDUCTION Bilateral 09/09/2016  Procedure: BILATERAL TURBINATE REDUCTION;  Surgeon: Newman Pies, MD;  Location: [facility];  Service: ENT;  Laterality: Bilateral;     A IV Location/Drains/Wounds Patient Lines/Drains/Airways Status     Active Line/Drains/Airways     Name Placement date Placement time Site Days   Peripheral IV 06/08/23 20 G 1.88" Left Antecubital 06/08/23  2225  Antecubital  1            Intake/Output Last 24 hours  Intake/Output  Summary (Last 24 hours) at 06/09/2023 0601 Last data filed at 06/09/2023 0409 Gross per 24 hour  Intake 1000 ml  Output --  Net 1000 ml    Labs/Imaging Results for orders placed or performed during the hospital encounter of 06/08/23 (from the past 48 hour(s))  CBG monitoring, ED     Status: None   Collection Time: 06/08/23  8:32 PM  Result Value Ref Range   Glucose-Capillary 88 70 - 99 mg/dL    Comment: Glucose reference range applies only to samples taken after fasting for at least 8 hours.  Ethanol     Status: None   Collection Time: 06/08/23  8:41 PM  Result Value Ref Range   Alcohol, Ethyl (B) <10 <10 mg/dL    Comment: (NOTE) Lowest detectable limit for serum alcohol is 10 mg/dL.  For medical purposes only. Performed at [facility], [address], [place], [place] [address]   Protime-INR     Status: None   Collection Time: 06/08/23  8:41 PM  Result Value Ref Range   Prothrombin Time 12.7 11.4 - 15.2 seconds   INR 0.9 0.8 - 1.2    Comment: (NOTE) INR goal varies based on device and disease states. Performed at [facility], [address] [place], [place] [address]   APTT     Status: Abnormal   Collection Time: 06/08/23  8:41 PM  Result Value Ref Range   aPTT 22 (L) 24 - 36 seconds    Comment: Performed at [facility], [address] [place], [place] [address]  CBC     Status: None   Collection Time: 06/08/23  8:41 PM  Result Value Ref Range   WBC 8.0 4.0 - 10.5 K/uL   RBC 4.26 3.87 - 5.11 MIL/uL   Hemoglobin 13.1 12.0 - 15.0 g/dL   HCT 02.7 25.3 - 66.4 %   MCV 93.7 80.0 - 100.0 fL   MCH 30.8 26.0 - 34.0 pg   MCHC 32.8 30.0 - 36.0 g/dL   RDW 40.3 47.4 - 25.9 %   Platelets 274 150 - 400 K/uL    Comment: REPEATED TO VERIFY   nRBC 0.0 0.0 - 0.2 %    Comment: Performed at [facility], [address], [place], [place] [address]  Differential     Status: None   Collection Time: 06/08/23  8:41 PM  Result Value Ref Range    Neutrophils Relative % 39 %   Neutro Abs 3.1 1.7 - 7.7 K/uL   Lymphocytes Relative 51 %   Lymphs Abs 4.0 0.7 - 4.0 K/uL   Monocytes Relative 7 %   Monocytes Absolute 0.6 0.1 - 1.0 K/uL   Eosinophils Relative 2 %   Eosinophils Absolute 0.2 0.0 - 0.5 K/uL   Basophils Relative 1 %   Basophils Absolute 0.1 0.0 - 0.1 K/uL   Immature Granulocytes 0 %   Abs Immature Granulocytes 0.02 0.00 - 0.07 K/uL    Comment: Performed  at [facility], [address] [place], [place] [address]  Comprehensive metabolic panel     Status: Abnormal   Collection Time: 06/08/23  8:41 PM  Result Value Ref Range   Sodium 142 135 - 145 mmol/L   Potassium 3.7 3.5 - 5.1 mmol/L   Chloride 110 98 - 111 mmol/L   CO2 23 22 - 32 mmol/L   Glucose, Bld 105 (H) 70 - 99 mg/dL    Comment: Glucose reference range applies only to samples taken after fasting for at least 8 hours.   BUN 19 6 - 20 mg/dL   Creatinine, Ser 1.30 0.44 - 1.00 mg/dL   Calcium 9.4 8.9 - 86.5 mg/dL   Total Protein 7.6 6.5 - 8.1 g/dL   Albumin 4.2 3.5 - 5.0 g/dL   AST 22 15 - 41 U/L   ALT 12 0 - 44 U/L   Alkaline Phosphatase 70 38 - 126 U/L   Total Bilirubin 0.3 0.3 - 1.2 mg/dL   GFR, Estimated >78 >46 mL/min    Comment: (NOTE) Calculated using the CKD-EPI Creatinine Equation (2021)    Anion gap 9 5 - 15    Comment: Performed at [facility], [address], [place], [place] [address]  I-stat chem 8, ED     Status: Abnormal   Collection Time: 06/08/23  8:43 PM  Result Value Ref Range   Sodium 143 135 - 145 mmol/L   Potassium 3.6 3.5 - 5.1 mmol/L   Chloride 109 98 - 111 mmol/L   BUN 20 6 - 20 mg/dL   Creatinine, Ser 2.84 0.44 - 1.00 mg/dL   Glucose, Bld 132 (H) 70 - 99 mg/dL    Comment: Glucose reference range applies only to samples taken after fasting for at least 8 hours.   Calcium, Ion 1.23 1.15 - 1.40 mmol/L   TCO2 23 22 - 32 mmol/L   Hemoglobin 13.6 12.0 - 15.0 g/dL   HCT 44.0 10.2 - 72.5 %  Lipid panel     Status:  Abnormal   Collection Time: 06/09/23  4:11 AM  Result Value Ref Range   Cholesterol 195 0 - 200 mg/dL   Triglycerides 366 (H) <150 mg/dL   HDL 46 >44 mg/dL   Total CHOL/HDL Ratio 4.2 RATIO   VLDL 31 0 - 40 mg/dL   LDL Cholesterol 034 (H) 0 - 99 mg/dL    Comment:        Total Cholesterol/HDL:CHD Risk Coronary Heart Disease Risk Table                     Men   Women  1/2 Average Risk   3.4   3.3  Average Risk       5.0   4.4  2 X Average Risk   9.6   7.1  3 X Average Risk  23.4   11.0        Use the calculated Patient Ratio above and the CHD Risk Table to determine the patient's CHD Risk.        ATP III CLASSIFICATION (LDL):  <100     mg/dL   Optimal  742-595  mg/dL   Near or Above                    Optimal  130-159  mg/dL   Borderline  638-756  mg/dL   High  >433     mg/dL   Very High Performed  at [facility], [address], [place], [place] [address]   Hemoglobin A1c     Status: None   Collection Time: 06/09/23  4:11 AM  Result Value Ref Range   Hgb A1c MFr Bld 5.4 4.8 - 5.6 %    Comment: (NOTE) Pre diabetes:          5.7%-6.4%  Diabetes:              >6.4%  Glycemic control for   <7.0% adults with diabetes    Mean Plasma Glucose 108.28 mg/dL    Comment: Performed at [facility], [address] [place], [place] [address]  Comprehensive metabolic panel     Status: Abnormal   Collection Time: 06/09/23  4:11 AM  Result Value Ref Range   Sodium 141 135 - 145 mmol/L   Potassium 3.7 3.5 - 5.1 mmol/L   Chloride 112 (H) 98 - 111 mmol/L   CO2 23 22 - 32 mmol/L   Glucose, Bld 108 (H) 70 - 99 mg/dL    Comment: Glucose reference range applies only to samples taken after fasting for at least 8 hours.   BUN 21 (H) 6 - 20 mg/dL   Creatinine, Ser 0.98 0.44 - 1.00 mg/dL   Calcium 8.6 (L) 8.9 - 10.3 mg/dL   Total Protein 5.6 (L) 6.5 - 8.1 g/dL   Albumin 3.1 (L) 3.5 - 5.0 g/dL   AST 16 15 - 41 U/L   ALT 14 0 - 44 U/L   Alkaline Phosphatase 58 38 - 126 U/L    Total Bilirubin 0.3 0.3 - 1.2 mg/dL   GFR, Estimated >11 >91 mL/min    Comment: (NOTE) Calculated using the CKD-EPI Creatinine Equation (2021)    Anion gap 6 5 - 15    Comment: Performed at [facility], [address], [place], [place] [address]  CBC     Status: Abnormal   Collection Time: 06/09/23  4:11 AM  Result Value Ref Range   WBC 7.4 4.0 - 10.5 K/uL   RBC 3.28 (L) 3.87 - 5.11 MIL/uL   Hemoglobin 10.3 (L) 12.0 - 15.0 g/dL   HCT 56.2 (L) 13.0 - 86.5 %   MCV 95.1 80.0 - 100.0 fL   MCH 31.4 26.0 - 34.0 pg   MCHC 33.0 30.0 - 36.0 g/dL   RDW 78.4 69.6 - 29.5 %   Platelets 240 150 - 400 K/uL   nRBC 0.0 0.0 - 0.2 %    Comment: Performed at [facility], [address], [place], [place] [address]   MR BRAIN WO CONTRAST  Result Date: 06/09/2023 CLINICAL DATA:  Acute neurologic deficit EXAM: MRI HEAD WITHOUT CONTRAST MRA HEAD WITHOUT CONTRAST MRA NECK WITHOUT CONTRAST TECHNIQUE: Multiplanar, multiecho pulse sequences of the brain and surrounding structures were obtained without intravenous contrast. Angiographic images of the Circle of Willis were obtained using MRA technique without intravenous contrast. Angiographic images of the neck were obtained using MRA technique without intravenous contrast. Carotid stenosis measurements (when applicable) are obtained utilizing NASCET criteria, using the distal internal carotid diameter as the denominator. COMPARISON:  None Available. FINDINGS: MRI HEAD FINDINGS Brain: No acute infarct, mass effect or extra-axial collection. No acute or chronic hemorrhage. Minimal multifocal hyperintense T2-weight signal within the white matter. The midline structures are normal. Vascular: Normal flow voids. Skull and upper cervical spine: Normal calvarium and skull base. Visualized upper cervical spine and soft tissues are normal. Sinuses/Orbits:No paranasal sinus  fluid levels or advanced mucosal thickening. No mastoid or middle ear effusion. Normal orbits.  MRA HEAD FINDINGS POSTERIOR CIRCULATION: --Vertebral arteries: Normal --Inferior cerebellar arteries: Normal. --Basilar artery: Normal. --Superior cerebellar arteries: Normal. --Posterior cerebral arteries: Normal. ANTERIOR CIRCULATION: --Intracranial internal carotid arteries: Normal. --Anterior cerebral arteries (ACA): Normal. --Middle cerebral arteries (MCA): Normal. MRA NECK FINDINGS There are areas of limited visualization due to time-of-flight technique, but the visible portions of the vertebral and carotid arteries are normal. There is no stenosis of the carotid bifurcations. The vertebral system is codominant. IMPRESSION: 1. No acute intracranial abnormality. 2. Normal intracranial MRA. 3. Normal MRA of the neck. Electronically Signed   By: Deatra Robinson M.D.   On: 06/09/2023 00:55   MR ANGIO HEAD WO CONTRAST  Result Date: 06/09/2023 CLINICAL DATA:  Acute neurologic deficit EXAM: MRI HEAD WITHOUT CONTRAST MRA HEAD WITHOUT CONTRAST MRA NECK WITHOUT CONTRAST TECHNIQUE: Multiplanar, multiecho pulse sequences of the brain and surrounding structures were obtained without intravenous contrast. Angiographic images of the Circle of Willis were obtained using MRA technique without intravenous contrast. Angiographic images of the neck were obtained using MRA technique without intravenous contrast. Carotid stenosis measurements (when applicable) are obtained utilizing NASCET criteria, using the distal internal carotid diameter as the denominator. COMPARISON:  None Available. FINDINGS: MRI HEAD FINDINGS Brain: No acute infarct, mass effect or extra-axial collection. No acute or chronic hemorrhage. Minimal multifocal hyperintense T2-weight signal within the white matter. The midline structures are normal. Vascular: Normal flow voids. Skull and upper cervical spine: Normal calvarium and skull base. Visualized upper cervical spine and soft tissues are normal. Sinuses/Orbits:No paranasal sinus fluid levels or advanced  mucosal thickening. No mastoid or middle ear effusion. Normal orbits. MRA HEAD FINDINGS POSTERIOR CIRCULATION: --Vertebral arteries: Normal --Inferior cerebellar arteries: Normal. --Basilar artery: Normal. --Superior cerebellar arteries: Normal. --Posterior cerebral arteries: Normal. ANTERIOR CIRCULATION: --Intracranial internal carotid arteries: Normal. --Anterior cerebral arteries (ACA): Normal. --Middle cerebral arteries (MCA): Normal. MRA NECK FINDINGS There are areas of limited visualization due to time-of-flight technique, but the visible portions of the vertebral and carotid arteries are normal. There is no stenosis of the carotid bifurcations. The vertebral system is codominant. IMPRESSION: 1. No acute intracranial abnormality. 2. Normal intracranial MRA. 3. Normal MRA of the neck. Electronically Signed   By: Deatra Robinson M.D.   On: 06/09/2023 00:55   MR ANGIO NECK WO CONTRAST  Result Date: 06/09/2023 CLINICAL DATA:  Acute neurologic deficit EXAM: MRI HEAD WITHOUT CONTRAST MRA HEAD WITHOUT CONTRAST MRA NECK WITHOUT CONTRAST TECHNIQUE: Multiplanar, multiecho pulse sequences of the brain and surrounding structures were obtained without intravenous contrast. Angiographic images of the Circle of Willis were obtained using MRA technique without intravenous contrast. Angiographic images of the neck were obtained using MRA technique without intravenous contrast. Carotid stenosis measurements (when applicable) are obtained utilizing NASCET criteria, using the distal internal carotid diameter as the denominator. COMPARISON:  None Available. FINDINGS: MRI HEAD FINDINGS Brain: No acute infarct, mass effect or extra-axial collection. No acute or chronic hemorrhage. Minimal multifocal hyperintense T2-weight signal within the white matter. The midline structures are normal. Vascular: Normal flow voids. Skull and upper cervical spine: Normal calvarium and skull base. Visualized upper cervical spine and soft tissues  are normal. Sinuses/Orbits:No paranasal sinus fluid levels or advanced mucosal thickening. No mastoid or middle ear effusion. Normal orbits. MRA HEAD FINDINGS POSTERIOR CIRCULATION: --Vertebral arteries: Normal --Inferior cerebellar arteries: Normal. --Basilar artery: Normal. --Superior cerebellar arteries: Normal. --Posterior cerebral arteries: Normal. ANTERIOR CIRCULATION: --Intracranial internal carotid arteries:  Normal. --Anterior cerebral arteries (ACA): Normal. --Middle cerebral arteries (MCA): Normal. MRA NECK FINDINGS There are areas of limited visualization due to time-of-flight technique, but the visible portions of the vertebral and carotid arteries are normal. There is no stenosis of the carotid bifurcations. The vertebral system is codominant. IMPRESSION: 1. No acute intracranial abnormality. 2. Normal intracranial MRA. 3. Normal MRA of the neck. Electronically Signed   By: Deatra Robinson M.D.   On: 06/09/2023 00:55   CT HEAD CODE STROKE WO CONTRAST  Result Date: 06/08/2023 CLINICAL DATA:  Code stroke.  Neuro deficit, acute, stroke suspected EXAM: CT HEAD WITHOUT CONTRAST TECHNIQUE: Contiguous axial images were obtained from the base of the skull through the vertex without intravenous contrast. RADIATION DOSE REDUCTION: This exam was performed according to the departmental dose-optimization program which includes automated exposure control, adjustment of the mA and/or kV according to patient size and/or use of iterative reconstruction technique. COMPARISON:  MRI head November 23, 21. FINDINGS: Brain: No evidence of acute large vascular territory infarction, hemorrhage, hydrocephalus, extra-axial collection or mass lesion/mass effect. Vascular: No hyperdense vessel. Skull: No acute fracture. Sinuses/Orbits: Clear stenosis.  No acute orbital findings. Other: No mastoid effusions. ASPECTS [facility] Stroke Program Early CT Score) total score (0-10 with 10 being normal): 10. IMPRESSION: 1. No evidence of  acute large vascular territory infarct or acute hemorrhage. 2. ASPECTS is 10. Electronically Signed   By: Feliberto Harts M.D.   On: 06/08/2023 20:53    Pending Labs Unresulted Labs (From admission, onward)     Start     Ordered   06/09/23 0500  HIV Antibody (routine testing w rflx)  (HIV Antibody (Routine testing w reflex) panel)  Add-on,   AD        06/09/23 0153            Vitals/Pain Today's Vitals   06/09/23 0240 06/09/23 0355 06/09/23 0432 06/09/23 0500  BP: 107/61 (!) 97/54 102/61 97/61  Pulse: 85 80 80 72  Resp: 15 (!) 22 15 15   Temp:      SpO2: 97% 97% 98% 96%  Weight:      Height:      PainSc:   6      Isolation Precautions No active isolations  Medications Medications  rosuvastatin (CRESTOR) tablet 40 mg (40 mg Oral Given 06/09/23 0206)  pantoprazole (PROTONIX) EC tablet 40 mg (has no administration in time range)  topiramate (TOPAMAX) tablet 100 mg (100 mg Oral Given 06/09/23 0206)  DULoxetine (CYMBALTA) DR capsule 30 mg (has no administration in time range)  enoxaparin (LOVENOX) injection 40 mg (has no administration in time range)  sodium chloride flush (NS) 0.9 % injection 3 mL (3 mLs Intravenous Given 06/09/23 0207)  sodium chloride flush (NS) 0.9 % injection 3 mL (has no administration in time range)  0.9 %  sodium chloride infusion (has no administration in time range)  ibuprofen (ADVIL) tablet 400 mg (has no administration in time range)  ondansetron (ZOFRAN) tablet 4 mg (has no administration in time range)    Or  ondansetron (ZOFRAN) injection 4 mg (has no administration in time range)  lactated ringers infusion (1,000 mLs Intravenous New Bag/Given 06/09/23 0426)   stroke: early stages of recovery book (has no administration in time range)  aspirin chewable tablet 81 mg (81 mg Oral Given 06/09/23 0205)  lactated ringers bolus 500 mL (0 mLs Intravenous Stopped 06/09/23 0356)    Mobility walks     Focused Assessments Neuro Assessment  Handoff:  Swallow screen pass? Yes  Cardiac Rhythm: Normal sinus rhythm NIH Stroke Scale  Dizziness Present: No Headache Present: No Interval: Shift assessment Level of Consciousness (1a.)   : Alert, keenly responsive LOC Questions (1b. )   : Answers both questions correctly LOC Commands (1c. )   : Performs both tasks correctly Best Gaze (2. )  : Normal Visual (3. )  : No visual loss Facial Palsy (4. )    : Normal symmetrical movements Motor Arm, Left (5a. )   : No drift Motor Arm, Right (5b. ) : No drift Motor Leg, Left (6a. )  : No drift Motor Leg, Right (6b. ) : No drift Limb Ataxia (7. ): Absent Sensory (8. )  : Normal, no sensory loss Best Language (9. )  : No aphasia Dysarthria (10. ): Normal Extinction/Inattention (11.)   : No Abnormality Complete NIHSS TOTAL: 0 Last date known well: 06/08/23 Last time known well: 2015 Neuro Assessment: Exceptions to WDL Neuro Checks:   Initial (06/08/23 2040)  Has TPA been given? No If patient is a Neuro Trauma and patient is going to OR before floor call report to 4N Charge nurse: [phone] or [phone]   R Recommendations: See Admitting Provider Note  Report given to:   Additional Notes: Pt A&O x4

## 2023-06-09 NOTE — Discharge Instructions (Signed)
Advised to follow-up with the [facility] in 4 weeks. Advised to take aspirin and Crestor 40 mg daily.  Stroke workup has been complete and normal.

## 2023-06-09 NOTE — Care Plan (Signed)
I saw Martha Moss with request of family for second opinion.  I answered all of family's questions.  Recommend continuing aspirin 81 mg daily.  Husband said he noted A-fib on telemetry overnight.  Discussed with medicine to review telemetry leads or ask cardiology because only telemetry strips saved that I can access does not show A-fib.  If patient is diagnosed with A-fib and started on any blood thinner, discussed with family to stop aspirin  Continue to follow-up with Dr. Sherryll Burger at [facility].  Xuan Mateus Annabelle Harman

## 2023-06-09 NOTE — TOC Transition Note (Signed)
Transition of Care [facility]) - CM/SW Discharge Note   Patient Details  Name: Martha Moss MRN: [id number] Date of Birth: 10/26/1965  [facility] CM/SW Contact:  Kermit Balo, RN Phone Number: 06/09/2023, 12:20 PM   Clinical Narrative:     Pt is discharging home with self care. No f/u per PT.  Pt has transportation home.  Final next level of care: Home/Self Care Barriers to Discharge: No Barriers Identified   Patient Goals and CMS Choice      Discharge Placement                         Discharge Plan and Services Additional resources added to the After Visit Summary for                                       Social Determinants of Health (SDOH) Interventions SDOH Screenings   Food Insecurity: No Food Insecurity (06/09/2023)  Housing: Low Risk  (06/09/2023)  Transportation Needs: No Transportation Needs (06/09/2023)  Utilities: Not At Risk (06/09/2023)  Depression (PHQ2-9): Low Risk  (06/02/2023)  Tobacco Use: Low Risk  (06/09/2023)     Readmission Risk Interventions     No data to display

## 2023-06-09 NOTE — Progress Notes (Signed)
Discharge education/instructions provided to patient. Patient verbalized understanding and in agreement. VSS. RA. Respirations are even and unlabored. NAD.   PIV removed, tip intact.Telemetry monitor removed.   Patient's family discharging patient home via private vehicle.

## 2023-06-10 ENCOUNTER — Telehealth: Payer: Self-pay

## 2023-06-10 ENCOUNTER — Telehealth: Payer: Self-pay | Admitting: Family Medicine

## 2023-06-10 NOTE — Transitions of Care (Post Inpatient/ED Visit) (Unsigned)
   06/10/2023  Name: Martha Moss MRN: [id number] DOB: [birth year]  Today's TOC FU Call Status: Today's TOC FU Call Status:: Unsuccessful Call (1st Attempt) Unsuccessful Call (1st Attempt) Date: 06/10/23  Attempted to reach the patient regarding the most recent Inpatient/ED visit.  Follow Up Plan: Additional outreach attempts will be made to reach the patient to complete the Transitions of Care (Post Inpatient/ED visit) call.   Signature Karena Addison, LPN [facility] Nurse Health Advisor Direct Dial [phone]

## 2023-06-10 NOTE — Telephone Encounter (Signed)
Caller name: RICHELE CHITTUM  On DPR?: Yes  Call back number: [phone] (mobile)  Provider they see: Sheliah Hatch, MD  Reason for call:  Pt states he was talking to someone here and got disconnected doesn't remember who but like a return a call

## 2023-06-10 NOTE — Telephone Encounter (Signed)
Pts husband states he had a call and missed the call. No reason found for [place] to call

## 2023-06-11 NOTE — Transitions of Care (Post Inpatient/ED Visit) (Signed)
06/11/2023  Name: Martha Moss MRN: [id number] DOB: 19-Jun-1966  Today's TOC FU Call Status: Today's TOC FU Call Status:: Successful TOC FU Call Completed Unsuccessful Call (1st Attempt) Date: 06/10/23 [facility] FU Call Complete Date: 06/11/23 Patient's Name and Date of Birth confirmed.  Transition Care Management Follow-up Telephone Call Date of Discharge: 06/09/23 Discharge Facility: [facility]) Type of Discharge: Inpatient Admission Primary Inpatient Discharge Diagnosis:: TIA How have you been since you were released from the hospital?: Better Any questions or concerns?: No  Items Reviewed: Did you receive and understand the discharge instructions provided?: Yes Medications obtained,verified, and reconciled?: Yes (Medications Reviewed) Any new allergies since your discharge?: No Dietary orders reviewed?: Yes Do you have support at home?: Yes People in Home: spouse  Medications Reviewed Today: Medications Reviewed Today     Reviewed by Karena Addison, LPN (Licensed Practical Nurse) on 06/11/23 at 1551  Med List Status: <None>   Medication Order Taking? Sig Documenting Provider Last Dose Status Informant  aspirin 81 MG chewable tablet [id number]  Chew 1 tablet (81 mg total) by mouth daily. Willeen Niece, MD  Active   DULoxetine (CYMBALTA) 30 MG capsule [id number] No Take 1 capsule (30 mg total) by mouth daily. Sheliah Hatch, MD 06/07/2023 pm Active Spouse/Significant Other, Self  famotidine (PEPCID) 20 MG tablet [id number] No Take 20 mg by mouth in the morning and at bedtime. Taking once daily, patient does not recall the dose [provider] 06/08/2023 am Active Self, Spouse/Significant Other  fexofenadine (ALLEGRA) 180 MG tablet [id number] No Take 180 mg by mouth as directed. Monday Wednesday and Friday [provider] 06/08/2023 am Active Spouse/Significant Other, Self  levocetirizine (XYZAL) 5 MG tablet [id number] No Take 5 mg by mouth as  directed. Tuesday Thursday Saturday and Sunday [provider] 06/07/2023 Active Spouse/Significant Other, Self  magnesium 30 MG tablet [id number] No Take 30 mg by mouth every other day. [provider] 06/08/2023 am Active Spouse/Significant Other, Self  meloxicam (MOBIC) 15 MG tablet [id number] No Take 15 mg by mouth daily. [provider] 06/08/2023 Active Spouse/Significant Other, Self  nitrofurantoin, macrocrystal-monohydrate, (MACROBID) 100 MG capsule [id number] No Take 1 capsule (100 mg total) by mouth at bedtime. Sheliah Hatch, MD Past Week prn Active Spouse/Significant Other, Self  pantoprazole (PROTONIX) 40 MG tablet [id number] No Take 1 tablet (40 mg total) by mouth daily. Please call [phone] to schedule an office visit for more refills. Cirigliano, Vito V, DO 06/08/2023 am Active Spouse/Significant Other, Self  rosuvastatin (CRESTOR) 40 MG tablet [id number]  Take 1 tablet (40 mg total) by mouth daily. Willeen Niece, MD  Active   topiramate (TOPAMAX) 100 MG tablet [id number] No Take 100 mg by mouth 2 (two) times daily. [provider] 06/08/2023 am Active Self, Spouse/Significant Other  traZODone (DESYREL) 50 MG tablet [id number] No Take 0.5-1 tablets (25-50 mg total) by mouth at bedtime as needed for sleep. Sheliah Hatch, MD Past Month Active Spouse/Significant Other, Self  VITAMIN D PO [id number] No Take 1,000 Units by mouth daily. [provider] 06/08/2023 am Active Spouse/Significant Other, Self  vitamin E 1000 UNIT capsule [id number] No Take 1,000 Units by mouth daily. [provider] 06/08/2023 am Active Spouse/Significant Other, Self            Home Care and Equipment/Supplies: Were Home Health Services Ordered?: NA Any new equipment or medical supplies ordered?: NA  Functional Questionnaire: Do you need assistance with bathing/showering or dressing?: No Do you need assistance with  meal preparation?: Yes Do you  need assistance with eating?: No Do you have difficulty maintaining continence: No Do you need assistance with getting out of bed/getting out of a chair/moving?: No Do you have difficulty managing or taking your medications?: No  Follow up appointments reviewed: PCP Follow-up appointment confirmed?: Yes Date of PCP follow-up appointment?: 06/15/23 Follow-up Provider: [facility] Follow-up appointment confirmed?: Yes Date of Specialist follow-up appointment?: 06/15/23 Follow-Up Specialty Provider:: neuro Do you need transportation to your follow-up appointment?: No Do you understand care options if your condition(s) worsen?: Yes-patient verbalized understanding    SIGNATURE Karena Addison, LPN [facility] Nurse Health Advisor Direct Dial [phone]

## 2023-06-15 ENCOUNTER — Encounter: Payer: Self-pay | Admitting: Family Medicine

## 2023-06-15 ENCOUNTER — Ambulatory Visit: Payer: Managed Care, Other (non HMO) | Admitting: Family Medicine

## 2023-06-15 VITALS — BP 104/72 | HR 78 | Temp 98.3°F | Ht 62.0 in | Wt 142.0 lb

## 2023-06-15 DIAGNOSIS — F32A Depression, unspecified: Secondary | ICD-10-CM | POA: Diagnosis not present

## 2023-06-15 DIAGNOSIS — G459 Transient cerebral ischemic attack, unspecified: Secondary | ICD-10-CM

## 2023-06-15 DIAGNOSIS — F419 Anxiety disorder, unspecified: Secondary | ICD-10-CM | POA: Diagnosis not present

## 2023-06-15 LAB — HEPATIC FUNCTION PANEL
ALT: 19 U/L (ref 0–35)
AST: 21 U/L (ref 0–37)
Albumin: 4.5 g/dL (ref 3.5–5.2)
Alkaline Phosphatase: 75 U/L (ref 39–117)
Bilirubin, Direct: 0 mg/dL (ref 0.0–0.3)
Total Bilirubin: 0.3 mg/dL (ref 0.2–1.2)
Total Protein: 7.7 g/dL (ref 6.0–8.3)

## 2023-06-15 LAB — BASIC METABOLIC PANEL
BUN: 23 mg/dL (ref 6–23)
CO2: 28 meq/L (ref 19–32)
Calcium: 9.9 mg/dL (ref 8.4–10.5)
Chloride: 106 meq/L (ref 96–112)
Creatinine, Ser: 0.8 mg/dL (ref 0.40–1.20)
GFR: 81.7 mL/min (ref >60.00)
Glucose, Bld: 85 mg/dL (ref 70–99)
Potassium: 4.1 meq/L (ref 3.5–5.1)
Sodium: 142 meq/L (ref 135–145)

## 2023-06-15 LAB — CBC WITH DIFFERENTIAL/PLATELET
Basophils Absolute: 0 10*3/uL (ref 0.0–0.1)
Basophils Relative: 0.6 % (ref 0.0–3.0)
Eosinophils Absolute: 0.2 10*3/uL (ref 0.0–0.7)
Eosinophils Relative: 2.2 % (ref 0.0–5.0)
HCT: 39.4 % (ref 36.0–46.0)
Hemoglobin: 12.8 g/dL (ref 12.0–15.0)
Lymphocytes Relative: 34.2 % (ref 12.0–46.0)
Lymphs Abs: 2.8 10*3/uL (ref 0.7–4.0)
MCHC: 32.5 g/dL (ref 30.0–36.0)
MCV: 94.6 fL (ref 78.0–100.0)
Monocytes Absolute: 0.5 10*3/uL (ref 0.1–1.0)
Monocytes Relative: 6.3 % (ref 3.0–12.0)
Neutro Abs: 4.7 10*3/uL (ref 1.4–7.7)
Neutrophils Relative %: 56.7 % (ref 43.0–77.0)
Platelets: 330 10*3/uL (ref 150.0–400.0)
RBC: 4.17 Mil/uL (ref 3.87–5.11)
RDW: 13.3 % (ref 11.5–15.5)
WBC: 8.3 10*3/uL (ref 4.0–10.5)

## 2023-06-15 LAB — TSH: TSH: 1.68 u[IU]/mL (ref 0.35–5.50)

## 2023-06-15 MED ORDER — ROSUVASTATIN CALCIUM 40 MG PO TABS: 40.0000 mg | ORAL_TABLET | Freq: Every day | ORAL | 1 refills | Status: DC

## 2023-06-15 MED ORDER — TRAZODONE HCL 100 MG PO TABS: 100.0000 mg | ORAL_TABLET | Freq: Every day | ORAL | 1 refills | Status: DC

## 2023-06-15 NOTE — Progress Notes (Signed)
   Subjective:    Patient ID: Martha Moss, female    DOB: 02-10-1966, 57 y.o.   MRN: [id number]  HPI Hospital f/u- pt was admitted 10/28-29 after having speech difficulty.  She had stuttering speech, L facial droop.  Code stroke was called and pt had MRI/MRA head and neck.  No acute findings.  Neuro recommended ASA and statin.  2nd neurologist agreed w/ ASA and indicated Zio monitor needed.  Was found to have Hgb of 10.3, LDL 118.  Now following w/ Dr Sherryll Burger (Neuro) at [facility] in [place].  Has monitor in place.  Pt reports feeling tired but otherwise good.  Continues to have trouble w/ stuttering.  Neuro feels that speech changes are related to migraines.  No residual weakness.  Admits to high levels of stress (husband is dealing w/ multiple medical issues) and not sleeping well.  She is aware her anxiety is high.  Currently on Cymbalta 30mg  daily.  Does not want to increase Cymbalta b/c higher doses cause decreased libido.   Review of Systems For ROS see HPI     Objective:   Physical Exam Vitals reviewed.  Constitutional:      General: She is not in acute distress.    Appearance: Normal appearance. She is well-developed. She is not ill-appearing.  HENT:     Head: Normocephalic and atraumatic.  Eyes:     Conjunctiva/sclera: Conjunctivae normal.     Pupils: Pupils are equal, round, and reactive to light.  Neck:     Thyroid: No thyromegaly.  Cardiovascular:     Rate and Rhythm: Normal rate and regular rhythm.     Pulses: Normal pulses.     Heart sounds: Normal heart sounds. No murmur heard. Pulmonary:     Effort: Pulmonary effort is normal. No respiratory distress.     Breath sounds: Normal breath sounds.  Abdominal:     General: There is no distension.     Palpations: Abdomen is soft.     Tenderness: There is no abdominal tenderness.  Musculoskeletal:     Cervical back: Normal range of motion and neck supple.  Lymphadenopathy:     Cervical: No cervical adenopathy.   Skin:    General: Skin is warm and dry.  Neurological:     Mental Status: She is alert and oriented to person, place, and time.     Comments: + stuttering speech  Psychiatric:        Behavior: Behavior normal.     Comments: anxious           Assessment & Plan:

## 2023-06-15 NOTE — Assessment & Plan Note (Signed)
New.  Reviewed ER notes, consult notes, H&P, and d/c summary.  Reviewed labs and imaging.  No evidence of CVA.  Neuro feels like is likely a complicated migraine rather than a CVA/TIA.  She has heart monitor in place.  Agree that she should continue daily ASA and higher dose of Crestor (prescription sent).  Repeat labs as recommended in D/C summary.

## 2023-06-15 NOTE — Patient Instructions (Signed)
Follow up in 6 months to recheck cholesterol We'll notify you of your lab results and make any changes if needed INCREASE the Trazodone to 100mg  nightly No other med changes at this time Call with any questions or concerns Hang in there!!!

## 2023-06-15 NOTE — Assessment & Plan Note (Addendum)
Deteriorated.  Pt is on Cymbalta 30mg  daily for mood and fibromyalgia but admits anxiety is running high and she is not able to sleep.  Since higher doses of Cymbalta cause decreased libido and she has a hx of seizures (need to avoid Wellbutrin), will start by increasing Trazodone to improve sleep.  Encouraged her to share how she is feeling rather than keeping it all in.  Will continue to follow closely.

## 2023-06-22 ENCOUNTER — Other Ambulatory Visit: Payer: Self-pay | Admitting: Family Medicine

## 2023-07-01 ENCOUNTER — Ambulatory Visit
Admission: RE | Admit: 2023-07-01 | Discharge: 2023-07-01 | Disposition: A | Discharge status: Home / Self Care | Payer: Managed Care, Other (non HMO) | Source: Ambulatory Visit | Attending: Family Medicine | Admitting: Family Medicine

## 2023-07-01 DIAGNOSIS — Z1231 Encounter for screening mammogram for malignant neoplasm of breast: Secondary | ICD-10-CM

## 2023-07-06 ENCOUNTER — Telehealth: Payer: Self-pay | Admitting: Gastroenterology

## 2023-07-06 ENCOUNTER — Ambulatory Visit: Payer: Managed Care, Other (non HMO)

## 2023-07-06 MED ORDER — PANTOPRAZOLE SODIUM 40 MG PO TBEC: 40.0000 mg | DELAYED_RELEASE_TABLET | Freq: Two times a day (BID) | ORAL | 0 refills | Status: DC

## 2023-07-06 NOTE — Telephone Encounter (Signed)
Inbound call from patient requesting a refill for pantoprazole medication. States when it was sent on 9/23 it was a 30 day dosage. Patient states she has been taking medication twice a day and would need another 30 day supply before 12/18 office visit. Please advise, thank you.

## 2023-07-06 NOTE — Telephone Encounter (Signed)
One month rx called in for BID dosing until her upcoming appointment with Dr Barron Alvine on 07/29/23.

## 2023-07-14 ENCOUNTER — Telehealth: Payer: Self-pay | Admitting: Pharmacy Technician

## 2023-07-14 ENCOUNTER — Other Ambulatory Visit (HOSPITAL_COMMUNITY): Payer: Self-pay

## 2023-07-14 NOTE — Telephone Encounter (Signed)
Pharmacy Patient Advocate Encounter   Received notification from CoverMyMeds that prior authorization for PANTOPRAZOLE 40MG  is required/requested.   Insurance verification completed.   The patient is insured through [employer] .   Per test claim: PA required; PA submitted to above mentioned insurance via CoverMyMeds Key/confirmation #/EOC [id number] Status is pending

## 2023-07-15 ENCOUNTER — Encounter: Payer: Self-pay | Admitting: Family Medicine

## 2023-07-21 ENCOUNTER — Ambulatory Visit: Payer: Commercial Managed Care - HMO | Admitting: Family Medicine

## 2023-07-21 ENCOUNTER — Encounter: Payer: Self-pay | Admitting: Family Medicine

## 2023-07-21 VITALS — BP 102/68 | HR 89 | Temp 98.3°F | Ht 62.0 in | Wt 144.0 lb

## 2023-07-21 DIAGNOSIS — G4700 Insomnia, unspecified: Secondary | ICD-10-CM

## 2023-07-21 MED ORDER — BELSOMRA 10 MG PO TABS: 1.0000 | ORAL_TABLET | Freq: Every evening | ORAL | 3 refills | Status: DC

## 2023-07-21 NOTE — Progress Notes (Signed)
   Subjective:    Patient ID: Martha Moss, female    DOB: [birth year], 57 y.o.   MRN: [id number]  HPI Insomnia- 'i can't sleep'.  Pt reports this is ongoing issue.  No relief w/ Trazodone.  Saw Dr Jenne Pane about OSA who offered CPAP or dental appliance.  Pt never followed up.  Having trouble both falling asleep and staying asleep.  Pt is not able to take Ambien due to sleep walking and other parasomnias.  Alfonso Patten was not effective.  Has tried OTC melatonin, Zquil, benadryl w/o relief.  Pt reports 'nothing has really worked for me'.   Review of Systems For ROS see HPI     Objective:   Physical Exam Vitals reviewed.  Constitutional:      General: She is not in acute distress.    Appearance: Normal appearance. She is not ill-appearing.  HENT:     Head: Normocephalic and atraumatic.  Skin:    General: Skin is warm and dry.  Neurological:     General: No focal deficit present.     Mental Status: She is alert and oriented to person, place, and time.     Cranial Nerves: No cranial nerve deficit.     Motor: No weakness.     Gait: Gait normal.  Psychiatric:        Mood and Affect: Mood normal.        Behavior: Behavior normal.        Thought Content: Thought content normal.           Assessment & Plan:

## 2023-07-21 NOTE — Assessment & Plan Note (Signed)
Deteriorated.  Pt reports she will go for multiple days w/o sleeping.  She has trouble falling asleep and staying asleep.  Said she felt her best sleep came while taking Ambien but she had parasomnias.  She states no other medication has worked for her- either OTC or prescription.  She has not tried Belsomra which works differently than other sleep medications.  Reviewed mechanism of action w/ pt and husband.  Pt is willing to try 'anything' at this point.  Prescription sent.  Will follow.

## 2023-07-21 NOTE — Patient Instructions (Signed)
Follow up in 4-6 weeks to recheck insomnia Go to [email or web address] and activate a savings card since there is no generic option You may not feel the Belsomra is working right away, but typically people notice improvement by 7 days STOP the Trazodone Call Dr Jenne Pane about proceeding with CPAP to help w/ sleep apnea Call with any questions or concerns Hang in there! Happy Holidays!!!

## 2023-07-23 ENCOUNTER — Encounter: Payer: Self-pay | Admitting: Family Medicine

## 2023-07-23 NOTE — Telephone Encounter (Signed)
Insurance it requesting furthur info on sleep medication use.  Belsomra PA was already started

## 2023-07-23 NOTE — Telephone Encounter (Signed)
Pharmacy Patient Advocate Encounter  Received notification from [employer] that Prior Authorization for PANTOPRAZOLE 40MG   has been APPROVED from 07/14/23 to 07/13/24. Patient already filled prescription at her pharmacy on 07/14/23.    PA #/Case ID/Reference #: [id number]

## 2023-07-28 ENCOUNTER — Telehealth: Payer: Self-pay

## 2023-07-28 ENCOUNTER — Other Ambulatory Visit (HOSPITAL_COMMUNITY): Payer: Self-pay

## 2023-07-28 NOTE — Telephone Encounter (Signed)
Pharmacy Patient Advocate Encounter   Received notification from Patient Advice Request messages that prior authorization for Belsomra 10MG  tablets is required/requested.   Insurance verification completed.   The patient is insured through [employer] .   Per test claim: PA required; PA submitted to above mentioned insurance via CoverMyMeds Key/confirmation #/EOC [id number] Status is pending

## 2023-07-28 NOTE — Telephone Encounter (Signed)
Pharmacy Patient Advocate Encounter  Received notification from [employer] that Prior Authorization for  Belsomra 10MG  tablets  has been APPROVED from 07/28/23 to 07/27/24. Ran test claim, Copay is $208.60. This test claim was processed through [facility]- copay amounts may vary at other pharmacies due to pharmacy/plan contracts, or as the patient moves through the different stages of their insurance plan.   PA #/Case ID/Reference #: [id number]

## 2023-07-29 ENCOUNTER — Ambulatory Visit: Payer: Commercial Managed Care - HMO | Admitting: Gastroenterology

## 2023-07-29 NOTE — Telephone Encounter (Signed)
Pt has been notified.

## 2023-08-03 ENCOUNTER — Ambulatory Visit: Payer: Commercial Managed Care - HMO | Attending: Internal Medicine | Admitting: Internal Medicine

## 2023-08-03 VITALS — BP 110/60 | HR 74 | Ht 62.0 in | Wt 144.0 lb

## 2023-08-03 DIAGNOSIS — R072 Precordial pain: Secondary | ICD-10-CM | POA: Diagnosis not present

## 2023-08-03 DIAGNOSIS — Z8249 Family history of ischemic heart disease and other diseases of the circulatory system: Secondary | ICD-10-CM | POA: Insufficient documentation

## 2023-08-03 DIAGNOSIS — I4891 Unspecified atrial fibrillation: Secondary | ICD-10-CM | POA: Insufficient documentation

## 2023-08-03 DIAGNOSIS — Z8673 Personal history of transient ischemic attack (TIA), and cerebral infarction without residual deficits: Secondary | ICD-10-CM | POA: Insufficient documentation

## 2023-08-03 DIAGNOSIS — E785 Hyperlipidemia, unspecified: Secondary | ICD-10-CM | POA: Insufficient documentation

## 2023-08-03 MED ORDER — IVABRADINE HCL 7.5 MG PO TABS: 15.0000 mg | ORAL_TABLET | Freq: Once | ORAL | 0 refills | Status: AC

## 2023-08-03 NOTE — Progress Notes (Addendum)
 Cardiology Office Note:  .    Date:  08/03/2023  ID:  Martha Moss, DOB July 09, 1966, MRN [id number] PCP: Jess Morita, MD  [facility] Providers Cardiologist:  None     CC: Chest pain Consulted for the evaluation of history of TIA at the behest of Dr. Paulla Bossier  History of Present Illness: .    Martha Moss is a 57 y.o. female with a history of FHX early CAD (father, sister) HLD, and   Martha Moss, a 57 year old female with a history of palpitations, presented for a post-hospital follow-up. In January 2021, she experienced palpitations and underwent an echocardiogram and heart monitoring, both of which showed no evidence of arrhythmia. Saw Wes O'neal with negative work up. However, in late October, she suffered a transient ischemic attack (TIA) and was subsequently followed by neurology. During this episode, she experienced significant facial droop. She was started on aspirin  and a statin due to an elevated LDL level. A repeat echocardiogram and heart monitoring were performed at the time of her TIA, both of which were reported as normal.  Despite these findings, the patient reported frequent chest pain and a sensation of her heart racing. She expressed concern about these symptoms, given her family history of heart disease, including her father's death from a heart attack at age 2 and two brothers from heart attacks.  Sister had MI  The patient's chest pain is atypical, occurring without any clear triggers or association with physical activity. She reported experiencing chest pain more frequently since her first TIA episode. She also reported occasional shortness of breath, sometimes associated with chest pain, and sometimes occurring independently. During these episodes, she has experienced lightheadedness and has occasionally lost consciousness.  She has been on cholesterol-lowering medication since her thirties, but has not needed it until recently. She  reported a significant change in her physical activity level over the past three years due to her health issues, reducing her walking frequency from three to four times a week to much less.      Relevant histories: .  Social- married; her husband is a heart patient ROS: As per HPI.   Studies Reviewed: .   Cardiac Studies & Procedures      ECHOCARDIOGRAM  ECHOCARDIOGRAM COMPLETE 06/09/2023  Narrative ECHOCARDIOGRAM REPORT    Patient Name:   Martha Moss Date of Exam: 06/09/2023 Medical Rec #:  [id number]            Height:       61.3 in Accession #:    [id number]           Weight:       150.6 lb Date of Birth:  Mar 27, 1966             BSA:          1.680 m Patient Age:    57 years             BP:           124/52 mmHg Patient Gender: F                    HR:           90 bpm. Exam Location:  Inpatient  Procedure: 2D Echo, Cardiac Doppler and Color Doppler  Indications:    TIA  History:        Patient has prior history of Echocardiogram examinations, most recent 07/04/2020. Risk Factors:Hypertension.  Sonographer:    Bambi Lever  [facility] Referring Phys: [id number] SUBRINA SUNDIL   Sonographer Comments: Image acquisition challenging due to breast implants. IMPRESSIONS   1. Left ventricular ejection fraction, by estimation, is 60 to 65%. The left ventricle has normal function. The left ventricle has no regional wall motion abnormalities. Left ventricular diastolic parameters were normal. 2. Right ventricular systolic function is normal. The right ventricular size is normal. 3. The mitral valve is normal in structure. No evidence of mitral valve regurgitation. No evidence of mitral stenosis. 4. The aortic valve is normal in structure. Aortic valve regurgitation is not visualized. No aortic stenosis is present. 5. The inferior vena cava is normal in size with greater than 50% respiratory variability, suggesting right atrial pressure of 3 mmHg.  Conclusion(s)/Recommendation(s): No  intracardiac source of embolism detected on this transthoracic study. Consider a transesophageal echocardiogram to exclude cardiac source of embolism if clinically indicated.  FINDINGS Left Ventricle: Left ventricular ejection fraction, by estimation, is 60 to 65%. The left ventricle has normal function. The left ventricle has no regional wall motion abnormalities. The left ventricular internal cavity size was normal in size. There is no left ventricular hypertrophy. Left ventricular diastolic parameters were normal. Normal left ventricular filling pressure.  Right Ventricle: The right ventricular size is normal. No increase in right ventricular wall thickness. Right ventricular systolic function is normal.  Left Atrium: Left atrial size was normal in size.  Right Atrium: Right atrial size was normal in size.  Pericardium: There is no evidence of pericardial effusion.  Mitral Valve: The mitral valve is normal in structure. No evidence of mitral valve regurgitation. No evidence of mitral valve stenosis.  Tricuspid Valve: The tricuspid valve is normal in structure. Tricuspid valve regurgitation is not demonstrated. No evidence of tricuspid stenosis.  Aortic Valve: The aortic valve is normal in structure. Aortic valve regurgitation is not visualized. No aortic stenosis is present. Aortic valve mean gradient measures 4.0 mmHg. Aortic valve peak gradient measures 6.6 mmHg. Aortic valve area, by VTI measures 2.30 cm.  Pulmonic Valve: The pulmonic valve was normal in structure. Pulmonic valve regurgitation is not visualized. No evidence of pulmonic stenosis.  Aorta: The aortic root is normal in size and structure.  Venous: The inferior vena cava is normal in size with greater than 50% respiratory variability, suggesting right atrial pressure of 3 mmHg.  IAS/Shunts: No atrial level shunt detected by color flow Doppler.   LEFT VENTRICLE PLAX 2D LVIDd:         3.70 cm   Diastology LVIDs:          1.90 cm   LV e' medial:    9.03 cm/s LV PW:         1.00 cm   LV E/e' medial:  8.9 LV IVS:        0.80 cm   LV e' lateral:   8.27 cm/s LVOT diam:     1.90 cm   LV E/e' lateral: 9.7 LV SV:         52 LV SV Index:   31 LVOT Area:     2.84 cm   RIGHT VENTRICLE             IVC RV Basal diam:  3.10 cm     IVC diam: 1.30 cm RV S prime:     19.80 cm/s TAPSE (M-mode): 2.6 cm  LEFT ATRIUM             Index        RIGHT ATRIUM  Index LA diam:        3.20 cm 1.91 cm/m   RA Area:     12.50 cm LA Vol (A2C):   39.0 ml 23.22 ml/m  RA Volume:   29.40 ml  17.50 ml/m LA Vol (A4C):   39.1 ml 23.28 ml/m LA Biplane Vol: 39.4 ml 23.46 ml/m AORTIC VALVE AV Area (Vmax):    2.35 cm AV Area (Vmean):   2.24 cm AV Area (VTI):     2.30 cm AV Vmax:           128.00 cm/s AV Vmean:          88.200 cm/s AV VTI:            0.226 m AV Peak Grad:      6.6 mmHg AV Mean Grad:      4.0 mmHg LVOT Vmax:         106.00 cm/s LVOT Vmean:        69.700 cm/s LVOT VTI:          0.183 m LVOT/AV VTI ratio: 0.81  AORTA Ao Root diam: 2.80 cm Ao Asc diam:  2.60 cm  MITRAL VALVE MV Area (PHT): 4.21 cm    SHUNTS MV Decel Time: 180 msec    Systemic VTI:  0.18 m MV E velocity: 80.50 cm/s  Systemic Diam: 1.90 cm MV A velocity: 71.70 cm/s MV E/A ratio:  1.12  Gaylyn Keas MD Electronically signed by Gaylyn Keas MD Signature Date/Time: 06/09/2023/10:31:12 AM    Final   MONITORS  LONG TERM MONITOR-LIVE TELEMETRY (3-14 DAYS) 07/01/2023  Narrative   Patient had a minimum heart rate of 56 bpm, maximum heart rate of 197 bpm, and average heart rate of 81 bpm. Predominant underlying rhythm was sinus rhythm. One five beat run of SVT Isolated PACs were rare (<1.0%). Isolated PVCs were rare (<1.0%). Triggered and diary events associated with sinus rhythm and sinus tachycardia.  No malignant arrhythmias.           Physical Exam:    VS:  BP 110/60 (BP Location: Left Arm)   Pulse 74   Ht 5\' 2"   (1.575 m)   Wt 144 lb (65.3 kg)   SpO2 99%   BMI 26.34 kg/m    Wt Readings from Last 3 Encounters:  08/03/23 144 lb (65.3 kg)  07/21/23 144 lb (65.3 kg)  06/15/23 142 lb (64.4 kg)    Gen: no distress,   Neck: No JVD Cardiac: No Rubs or Gallops, no murmur, RRR +2 radial pulses Respiratory: Clear to auscultation bilaterally, normal effort, normal  respiratory rate GI: Soft, nontender, non-distended  MS: No  edema;  moves all extremities Integument: Skin feels warm Neuro:  At time of evaluation, alert and oriented to person/place/time/situation  Psych: Normal affect, patient feels well   ASSESSMENT AND PLAN: .    Transient Ischemic Attack (TIA) Recent TIA on 10/28 and 10/29 with facial droop. Followed by neurology. LDL above goal, started on statin. No arrhythmia on heart monitoring. Repeat echocardiogram normal. Discussed potential patent foramen ovale (PFO) and need for closure if present. Explained 1 in 4 people have PFO, but not all require intervention. Further discussion with neurology and cardiology if PFO is found. - Order limited echocardiogram with bubble study - Continue aspirin  - Monitor LDL levels (Lp(a) and LDL direct)  Chest Pain - Intermittent chest pain, not exertional. No arrhythmia on heart monitor. Family history of early heart disease. Differential includes non-cardiac causes vs coronary  artery disease. Discussed benefits of coronary CT scan to evaluate for coronary artery disease. Explained zero calcium  score's high negative predictive value. Discussed potential need for medication or further intervention if plaque or blockage is found. - Order coronary CT scan - Check cholesterol and lipoprotein(a) levels - Follow up with cardiology PA/NP in early March; if obstructive disease not LM/pLAD; will start medical therapy and bring back sooner  Hyperlipidemia - Elevated LDL. Family history of early heart disease. Started on statin post-TIA. Discussed importance of  aggressive cholesterol management due to family history and recent TIA. Further management depends on test results, including lipoprotein(a) levels. - Check cholesterol levels - Continue statin therapy - Consider more aggressive cholesterol management based on test results  General Health Maintenance Focus on secondary prevention of cardiovascular events. Discussed importance of diet and exercise modifications. Explained potential need for medication adjustments based on test results. - Encourage diet and exercise modifications - Discuss potential need for medication adjustments based on test results  Transitioned history of Atrial fibrillation NOS 06/15/23 - if all testing is negative, I will have her see EP for consideration of ILR: I cannot confirm the transient Atrial fibrillation 06/15/23 through documented tracings  Follow-up - Follow up with PA/NP in early March unless high risk findings - after check in visit can likely be PRN   Gloriann Larger, MD FASE [facility] Cardiologist [facility]  [address] [place], [address] [place], [place] [address] [phone]  9:36 AM   Patient has had many delays due to insurance.  In interval, patient has had seizure like activity.  In discussing with patient's husband, we have been told that she cannot received regadenosin.  She had a second opinion about her seizure like activity and it could potentially lower the seizure threshold. - Unable to walk on a treadmill - unable to get chemical stress test due to comorbidities - needs cardiac CT.  Gloriann Larger, MD FASE [facility] Cardiologist [facility]  [address] [place], [address] [place], [place] [address] [phone]  2:27 PM

## 2023-08-03 NOTE — Patient Instructions (Addendum)
Medication Instructions:  Your physician recommends that you continue on your current medications as directed. Please refer to the Current Medication list given to you today.  *If you need a refill on your cardiac medications before your next appointment, please call your pharmacy*   Lab Work: LDL Direct, LPA   If you have labs (blood work) drawn today and your tests are completely normal, you will receive your results only by: MyChart Message (if you have MyChart) OR A paper copy in the mail If you have any lab test that is abnormal or we need to change your treatment, we will call you to review the results.   Testing/Procedures: Your physician has requested that you have an echocardiogram. Echocardiography is a painless test that uses sound waves to create images of your heart. It provides your doctor with information about the size and shape of your heart and how well your heart's chambers and valves are working. This procedure takes approximately one hour. There are no restrictions for this procedure. Please do NOT wear cologne, perfume, aftershave, or lotions (deodorant is allowed). Please arrive 15 minutes prior to your appointment time.  Please note: We ask at that you not bring children with you during ultrasound (echo/ vascular) testing. Due to room size and safety concerns, children are not allowed in the ultrasound rooms during exams. Our front office staff cannot provide observation of children in our lobby area while testing is being conducted. An adult accompanying a patient to their appointment will only be allowed in the ultrasound room at the discretion of the ultrasound technician under special circumstances. We apologize for any inconvenience.   Your physician has requested that you have cardiac CT. Cardiac computed tomography (CT) is a painless test that uses an x-ray machine to take clear, detailed pictures of your heart. For further information please visit  [email or web address] Please follow instruction sheet as given.     Follow-Up: At [facility], you and your health needs are our priority.  As part of our continuing mission to provide you with exceptional heart care, we have created designated Provider Care Teams.  These Care Teams include your primary Cardiologist (physician) and Advanced Practice Providers (APPs -  Physician Assistants and Nurse Practitioners) who all work together to provide you with the care you need, when you need it.  We recommend signing up for the patient portal called "MyChart".  Sign up information is provided on this After Visit Summary.  MyChart is used to connect with patients for Virtual Visits (Telemedicine).  Patients are able to view lab/test results, encounter notes, upcoming appointments, etc.  Non-urgent messages can be sent to your provider as well.   To learn more about what you can do with MyChart, go to [email or web address].    Your next appointment:   4 month(s)  Provider:   Jari Favre, PA-C, Robin Searing, NP, or Perlie Gold, PA-C        Other Instructions  Your cardiac CT will be scheduled at one of the below locations:   [facility] [address] [place], [place] [address] [phone]  OR  [facility] [address] [place], [place] [address] [phone]  OR   [facility] [address] [place], [place] [address] [phone]  If scheduled at [facility], please arrive at the [facility] Children's Entrance (Entrance C2) of [facility] 30 minutes prior to test start time. You can use the  FREE valet parking offered at entrance C (encouraged to control the heart rate for the test)  Proceed to the [facility] Radiology Department (first floor) to check-in and test prep.  All radiology patients and guests should use entrance C2 at [facility], accessed from  [facility], even though the hospital's physical address listed is [address]    If scheduled at [facility] or [facility], please arrive 15 mins early for check-in and test prep.  There is spacious parking and easy access to the radiology department from the [facility] entrance. Please enter here and check-in with the desk attendant.   Please follow these instructions carefully (unless otherwise directed):  An IV will be required for this test and Nitroglycerin will be given.  Hold all erectile dysfunction medications at least 3 days (72 hrs) prior to test. (Ie viagra, cialis, sildenafil, tadalafil, etc)   On the Night Before the Test: Be sure to Drink plenty of water. Do not consume any caffeinated/decaffeinated beverages or chocolate 12 hours prior to your test. Do not take any antihistamines 12 hours prior to your test.- - [place], Xyzal   On the Day of the Test: Drink plenty of water until 1 hour prior to the test. Do not eat any food 1 hour prior to test. You may take your regular medications prior to the test.  Take ivabradine (Corlanor) 15 mg two hours prior to test. If you take Furosemide/Hydrochlorothiazide/Spironolactone/Chlorthalidone, please HOLD on the morning of the test. Patients who wear a continuous glucose monitor MUST remove the device prior to scanning. FEMALES- please wear underwire-free bra if available, avoid dresses & tight clothing         After the Test: Drink plenty of water. After receiving IV contrast, you may experience a mild flushed feeling. This is normal. On occasion, you may experience a mild rash up to 24 hours after the test. This is not dangerous. If this occurs, you can take Benadryl 25 mg and increase your fluid intake. If you experience trouble breathing, this can be serious. If it is severe call 911 IMMEDIATELY. If it is mild, please call our  office.  We will call to schedule your test 2-4 weeks out understanding that some insurance companies will need an authorization prior to the service being performed.   For more information and frequently asked questions, please visit our website : [email or web address]  For non-scheduling related questions, please contact the cardiac imaging nurse navigator should you have any questions/concerns: Cardiac Imaging Nurse Navigators Direct Office Dial: [phone]   For scheduling needs, including cancellations and rescheduling, please call [place], [phone].

## 2023-08-05 LAB — LDL CHOLESTEROL, DIRECT: LDL Direct: 114 mg/dL — ABNORMAL HIGH (ref 0–99)

## 2023-08-05 LAB — LIPOPROTEIN A (LPA): Lipoprotein (a): 38.7 nmol/L (ref <75.0)

## 2023-08-07 ENCOUNTER — Telehealth: Payer: Self-pay

## 2023-08-07 DIAGNOSIS — Z8673 Personal history of transient ischemic attack (TIA), and cerebral infarction without residual deficits: Secondary | ICD-10-CM

## 2023-08-07 DIAGNOSIS — E785 Hyperlipidemia, unspecified: Secondary | ICD-10-CM

## 2023-08-07 DIAGNOSIS — Z8249 Family history of ischemic heart disease and other diseases of the circulatory system: Secondary | ICD-10-CM

## 2023-08-07 MED ORDER — EZETIMIBE 10 MG PO TABS: 10.0000 mg | ORAL_TABLET | Freq: Every day | ORAL | 3 refills | Status: DC

## 2023-08-07 NOTE — Telephone Encounter (Signed)
The patient has been notified of the result and verbalized understanding.  All questions (if any) were answered. Macie Burows, RN 08/07/2023 10:13 AM   Orders placed and released for draw in late March or early April.

## 2023-08-07 NOTE — Telephone Encounter (Signed)
-----   Message from Christell Constant sent at 08/06/2023  4:01 PM EST ----- Results: Normal Lp(a), LDL above goal given TIA hx Plan: Zetia 10 mg addition and labs in 3 months  Christell Constant, MD

## 2023-08-14 ENCOUNTER — Other Ambulatory Visit: Payer: Self-pay

## 2023-08-14 MED ORDER — PANTOPRAZOLE SODIUM 40 MG PO TBEC: 40.0000 mg | DELAYED_RELEASE_TABLET | Freq: Two times a day (BID) | ORAL | 1 refills | Status: DC

## 2023-08-24 ENCOUNTER — Encounter: Payer: Self-pay | Admitting: Family Medicine

## 2023-08-25 ENCOUNTER — Ambulatory Visit: Payer: Commercial Managed Care - HMO | Admitting: Family Medicine

## 2023-09-02 ENCOUNTER — Encounter: Payer: Self-pay | Admitting: Family Medicine

## 2023-09-03 ENCOUNTER — Ambulatory Visit (HOSPITAL_COMMUNITY): Payer: Commercial Managed Care - HMO

## 2023-09-03 ENCOUNTER — Encounter: Payer: Self-pay | Admitting: Family Medicine

## 2023-09-03 ENCOUNTER — Telehealth: Payer: Commercial Managed Care - HMO | Admitting: Family Medicine

## 2023-09-03 ENCOUNTER — Encounter: Payer: Self-pay | Admitting: Pharmacist

## 2023-09-03 DIAGNOSIS — J101 Influenza due to other identified influenza virus with other respiratory manifestations: Secondary | ICD-10-CM | POA: Diagnosis not present

## 2023-09-03 MED ORDER — OSELTAMIVIR PHOSPHATE 75 MG PO CAPS
75.0000 mg | ORAL_CAPSULE | Freq: Two times a day (BID) | ORAL | 0 refills | Status: DC
Start: 2023-09-03 — End: 2023-10-22

## 2023-09-03 NOTE — Progress Notes (Signed)
Virtual Visit via Video   I connected with patient on 09/03/23 at  1:40 PM EST by a video enabled telemedicine application and verified that I am speaking with the correct person using two identifiers.  Location patient: Home Location provider: [place], Office Persons participating in the virtual visit: Patient, Provider, CMA Archie Patten H)  I discussed the limitations of evaluation and management by telemedicine and the availability of in person appointments. The patient expressed understanding and agreed to proceed.  Subjective:   HPI:   Flu- pt's husband was here yesterday and tested + for Flu A.  Sxs started Tuesday.  + fever, chills, body aches, HA, cough.  + nausea, no vomiting.    ROS:   See pertinent positives and negatives per HPI.  Patient Active Problem List   Diagnosis Date Noted   Atrial fibrillation (HCC) 08/03/2023   Precordial pain 08/03/2023   Hx-TIA (transient ischemic attack) 08/03/2023   Family history of early CAD 08/03/2023   Hyperlipidemia 08/03/2023   TIA (transient ischemic attack) 06/09/2023   History of migraine 06/09/2023   Overweight (BMI 25.0-29.9) 06/02/2023   Interstitial cystitis 11/24/2022   DJD (degenerative joint disease), lumbosacral 09/15/2022   Leg pain 09/14/2022   PAD (peripheral artery disease) (HCC) 05/04/2022   Esophageal dysphagia    Right upper quadrant abdominal pain    Seizure-like activity (HCC) 07/03/2020   Psychogenic nonepileptic seizure 07/03/2020   Seizure (HCC) 07/03/2020   Transient hypotension 07/03/2020   Neuropathy of both feet 12/22/2017   Physical exam 09/25/2016   Chronic idiopathic constipation 10/13/2014   Insomnia 06/22/2013   Pain in right wrist 06/22/2013   Soft tissue mass 06/22/2013   Plantar fasciitis of left foot 03/14/2013   Anxiety and depression 10/28/2010   Migraine without aura 10/28/2010   GERD 10/28/2010   CHEST PAIN, ATYPICAL, HX OF 03/27/2010   ALLERGIC RHINITIS, SEASONAL  12/07/2009   Recurrent UTI 10/26/2009   Fibromyalgia 10/26/2009   POLYARTHRITIS 10/19/2009    Social History   Tobacco Use   Smoking status: Never   Smokeless tobacco: Never  Substance Use Topics   Alcohol use: Yes    Comment: rarely    Current Outpatient Medications:    DULoxetine (CYMBALTA) 30 MG capsule, TAKE 1 CAPSULE BY MOUTH DAILY, Disp: 90 capsule, Rfl: 1   ezetimibe (ZETIA) 10 MG tablet, Take 1 tablet (10 mg total) by mouth daily., Disp: 90 tablet, Rfl: 3   fexofenadine (ALLEGRA) 180 MG tablet, Take 180 mg by mouth as directed. Monday Wednesday and Friday, Disp: , Rfl:    levocetirizine (XYZAL) 5 MG tablet, Take 5 mg by mouth as directed. Tuesday Thursday Saturday and Sunday, Disp: , Rfl:    magnesium 30 MG tablet, Take 30 mg by mouth every other day., Disp: , Rfl:    nitrofurantoin, macrocrystal-monohydrate, (MACROBID) 100 MG capsule, Take 1 capsule (100 mg total) by mouth at bedtime., Disp: 30 capsule, Rfl: 3   pantoprazole (PROTONIX) 40 MG tablet, Take 1 tablet (40 mg total) by mouth 2 (two) times daily before a meal. Please keep appointment for 10-12-23 at 1:40 pm for further refills, Disp: 60 tablet, Rfl: 1   Suvorexant (BELSOMRA) 10 MG TABS, Take 1 tablet (10 mg total) by mouth at bedtime., Disp: 30 tablet, Rfl: 3   topiramate (TOPAMAX) 100 MG tablet, Take 100 mg by mouth 2 (two) times daily., Disp: , Rfl:    VITAMIN D PO, Take 1,000 Units by mouth daily., Disp: , Rfl:  vitamin E 1000 UNIT capsule, Take 1,000 Units by mouth daily., Disp: , Rfl:    rosuvastatin (CRESTOR) 40 MG tablet, Take 1 tablet (40 mg total) by mouth daily., Disp: 90 tablet, Rfl: 1  Allergies  Allergen Reactions   Bee Venom Anaphylaxis   Azithromycin     REACTION: hives   Diphenhydramine Hcl     REACTION: hives   Erythromycin     REACTION: hives   Morphine And Codeine Nausea And Vomiting   Penicillins     REACTION: rash, fever   Percocet [Oxycodone-Acetaminophen] Other (See Comments)     Hallucinations    Sulfonamide Derivatives     REACTION: rash, fever   Latex Swelling and Rash    Face and tongue swelling   Tetracyclines & Related Rash    fever    Objective:   There were no vitals taken for this visit. AAOx3, obviously not feeling well NCAT, EOMI No obvious CN deficits Coloring WNL Pt is able to speak clearly, coherently without shortness of breath or increased work of breathing. + hacking cough Thought process is linear.  Mood is appropriate.   Assessment and Plan:   Influenza A- new.  Husband dx'd in office yesterday.  Pt w/ same sxs.  Start Tamiflu.  Reviewed supportive care and red flags that should prompt return.  Pt expressed understanding and is in agreement w/ plan.    Neena Rhymes, MD 09/03/2023

## 2023-09-03 NOTE — Telephone Encounter (Signed)
FYI

## 2023-09-03 NOTE — Progress Notes (Signed)
   09/03/2023 Name: Martha Moss MRN: [id number] DOB: [birth year]  Chief Complaint  Patient presents with   Medication Management    Subjective: Received forwarded phone message from patient's PCP requesting assistance with Belsomra.  Belsomra was prescribed 07/21/2023. It appeared that it required prior authorization. Our pharmacy prior authorization team noted that Belsomra was approved from 07/28/23 to 07/27/24 thru [employer].  They also ran test claim which showed estimated copay of $208.60 (his test claim was processed through [facility]- copay amounts may vary at other pharmacies due to pharmacy/plan contracts, or as the patient moves through the different stages of their insurance plan. However patient states she tried to fill at her pharmacy and cost was still $500.   Previous therapies for insomnia tried: trazodone and zolpidem   For 2025 patient has a high deductible ACA plan with [employer] - [place] [employer].  Belsomra is not on formulary but I am assuming the previous prior authorization is still valid.    Current Pharmacy:  [facility] - [place], [place] - [address] [place] [address] Phone: [phone] Fax: [phone]   Assessment/Plan:   Medication Access:  - Provided patient with a 10 days free voucher for Belsomra 10mg  . Called information to her pharmacy. (If dose is increased in future she can also use a new voucher for each strength 15 and 20mg ) - Discussed applying for medication assistance program with [employer] for [employer]. Provided patient link on MyChart to online application.  - Patient also could use a discount card after the 10 day trial. If the cost of $208.60 is stll valid at a [facility] pharmacy, then after the $150 discount from the card patient would pay about $58.60. Will wait to see how she does with the free 10 tabs and then get discount card.  - Checked 2025 formulary for  [employer] - potential alternatives eszopiclone 1,2,3mg  - tier 2 generic; zaleplon 5 or 10mg  - tier 2 generic; doxepin 3 and 6mg  - tier 3 preferred brand and doxepin 10mg  - tier 2 generic    Follow Up Plan: 1 week to check on medication assistance program process and how Belsomra is working  Henrene Pastor, PharmD Engineer, building services Primary Care  Population Health [phone]

## 2023-09-10 ENCOUNTER — Other Ambulatory Visit: Payer: Self-pay | Admitting: Pharmacist

## 2023-09-10 NOTE — Progress Notes (Unsigned)
   09/10/2023 Name: STACEY SAGO MRN: [id number] DOB: [birth year]  Chief Complaint  Patient presents with   Medication Management    Subjective: Received forwarded phone message from patient's PCP requesting assistance with Belsomra.  Last week I was able to supply patient with a free trial coupon for #10 tablets of Belsomra 10mg . I supplied coupon to her pharmacy and called. They processed coupon and cost was $0 to patient.  Spoke with patient today and she had not started Belsomra yet mostly because she has had the flu and has not been out recently. Also she has not received a call from her pharmacy about the refill.    Belsomra was prescribed 07/21/2023. It appeared that it required prior authorization. Our pharmacy prior authorization team noted that Belsomra was approved from 07/28/23 to 07/27/24 thru [employer].  They also ran test claim which showed estimated copay of $208.60 (this test claim was processed through [facility]- copay amounts may vary at other pharmacies due to pharmacy/plan contracts, or as the patient moves through the different stages of their insurance plan. However patient states she tried to fill at her pharmacy and cost was still $500.   Previous therapies for insomnia tried: trazodone and zolpidem   For 2025 patient has a high deductible ACA plan with [employer] - [place] [employer].  Belsomra is not on formulary but I am assuming the previous prior authorization is still valid.   Current Pharmacy:  [facility] - [place], [place] - [address] [place] [address] Phone: [phone] Fax: [phone]   Assessment/Plan:   Medication Access:  - Called [facility] and they did not fill patient's Belsomra because that cannot break the package of 30 tabs to dispense just 10 tablets. They did not notify patient or office about this.  - Checked with [facility] to see if they  would be able to fill just 10 tablets with free coupon for Belsomra but they too are not able to break the box of 30 tablets. Will ask [facility] at [facility] if they could transfer Belsomra from [facility] and try to use discount card.   - Patient also could use a discount card after the 10 day trial. If the cost of $208.60 is stll valid at a [facility] pharmacy, then after the $150 discount from the card patient would pay about $58.60. Will wait to see how she does with the free 10 tabs and then get discount card.  - Checked 2025 formulary for [employer] - potential alternatives eszopiclone 1,2,3mg  - tier 2 generic; zaleplon 5 or 10mg  - tier 2 generic; doxepin 3 and 6mg  - tier 3 preferred brand and doxepin 10mg  - tier 2 generic    Follow Up Plan: 1 week to check on medication assistance program process and how Belsomra is working  Henrene Pastor, PharmD Engineer, building services Primary Care  Population Health [phone]

## 2023-09-22 ENCOUNTER — Ambulatory Visit (HOSPITAL_COMMUNITY): Payer: Commercial Managed Care - HMO | Attending: Internal Medicine

## 2023-09-22 DIAGNOSIS — Z8673 Personal history of transient ischemic attack (TIA), and cerebral infarction without residual deficits: Secondary | ICD-10-CM

## 2023-09-22 DIAGNOSIS — R072 Precordial pain: Secondary | ICD-10-CM | POA: Diagnosis present

## 2023-09-22 LAB — ECHOCARDIOGRAM LIMITED BUBBLE STUDY
Area-P 1/2: 4.19 cm2
S' Lateral: 2.7 cm

## 2023-09-26 ENCOUNTER — Encounter: Payer: Self-pay | Admitting: Internal Medicine

## 2023-10-12 ENCOUNTER — Ambulatory Visit: Payer: Commercial Managed Care - HMO | Admitting: Gastroenterology

## 2023-10-22 ENCOUNTER — Other Ambulatory Visit: Payer: Self-pay

## 2023-10-22 ENCOUNTER — Emergency Department (HOSPITAL_COMMUNITY)
Admission: EM | Admit: 2023-10-22 | Discharge: 2023-10-23 | Disposition: A | Discharge status: Home / Self Care | Payer: Self-pay | Attending: Emergency Medicine | Admitting: Emergency Medicine

## 2023-10-22 ENCOUNTER — Encounter (HOSPITAL_COMMUNITY): Payer: Self-pay | Admitting: Emergency Medicine

## 2023-10-22 DIAGNOSIS — R569 Unspecified convulsions: Secondary | ICD-10-CM | POA: Insufficient documentation

## 2023-10-22 DIAGNOSIS — Z8673 Personal history of transient ischemic attack (TIA), and cerebral infarction without residual deficits: Secondary | ICD-10-CM | POA: Insufficient documentation

## 2023-10-22 DIAGNOSIS — Z91040 Latex allergy status: Secondary | ICD-10-CM | POA: Diagnosis not present

## 2023-10-22 HISTORY — DX: Transient cerebral ischemic attack, unspecified: G45.9

## 2023-10-22 MED ORDER — ACETAMINOPHEN 325 MG PO TABS
650.0000 mg | ORAL_TABLET | Freq: Once | ORAL | Status: AC
  Administered 2023-10-22: 650 mg via ORAL
  Filled 2023-10-22: qty 2

## 2023-10-22 NOTE — ED Triage Notes (Signed)
 Patient arrived with EMS from home , witnessed seizure activity x2 this evening approx. 1 min ea. , arms/legs twitchings with blank stare as described by EMS , received Versed 5 mg IV prior to arrival by EMS .

## 2023-10-22 NOTE — ED Provider Notes (Signed)
 [place] EMERGENCY DEPARTMENT AT [facility] Provider Note   CSN: [id number] Arrival date & time: 10/22/23  2258     History {Add pertinent medical, surgical, social history, OB history to HPI:1} Chief Complaint  Patient presents with   Seizures    Martha Moss is a 58 y.o. female.  The history is provided by the patient.  Patient with history of depression, fibromyalgia, 10 drug allergies, migraines presents for reported seizure.  Patient arrives via EMS for possible seizures It is reported patient had a witnessed seizure activity that happened twice tonight.  It is reported the patient had "arms and legs twitching with blank stare" EMS gave patient 5mg  of Versed  Patient is now awake and alert.  She reports prior to the seizure she was talking to her husband then she started to have a headache and then fell.  She reports previous history of seizures and takes Topamax.  Denies any recent illnesses.  No fevers or vomiting.   No focal weakness  She reports previous seizure was last fall   Past Medical History:  Diagnosis Date   Depression    Fibromyalgia    GERD (gastroesophageal reflux disease)    Migraine    PONV (postoperative nausea and vomiting)    TIA (transient ischemic attack)     Home Medications Prior to Admission medications   Medication Sig Start Date End Date Taking? Authorizing Provider  DULoxetine (CYMBALTA) 30 MG capsule TAKE 1 CAPSULE BY MOUTH DAILY 06/22/23   Sheliah Hatch, MD  ezetimibe (ZETIA) 10 MG tablet Take 1 tablet (10 mg total) by mouth daily. 08/07/23   Chandrasekhar, Lafayette Dragon A, MD  fexofenadine (ALLEGRA) 180 MG tablet Take 180 mg by mouth as directed. Monday Wednesday and Friday    [provider]  levocetirizine (XYZAL) 5 MG tablet Take 5 mg by mouth as directed. Tuesday Thursday Saturday and Sunday    [provider]  magnesium 30 MG tablet Take 30 mg by mouth every other day.    [provider]  nitrofurantoin, macrocrystal-monohydrate, (MACROBID) 100 MG capsule Take 1 capsule (100 mg total) by mouth at bedtime. 11/24/22   Sheliah Hatch, MD  pantoprazole (PROTONIX) 40 MG tablet Take 1 tablet (40 mg total) by mouth 2 (two) times daily before a meal. Please keep appointment for 10-12-23 at 1:40 pm for further refills 08/14/23   Cirigliano, Vito V, DO  rosuvastatin (CRESTOR) 40 MG tablet Take 1 tablet (40 mg total) by mouth daily. 06/15/23 08/03/23  Sheliah Hatch, MD  Suvorexant (BELSOMRA) 10 MG TABS Take 1 tablet (10 mg total) by mouth at bedtime. Patient not taking: Reported on 09/10/2023 07/21/23   Sheliah Hatch, MD  topiramate (TOPAMAX) 100 MG tablet Take 100 mg by mouth 2 (two) times daily. 06/08/23 06/02/24  [provider]  VITAMIN D PO Take 1,000 Units by mouth daily.    [provider]  vitamin E 1000 UNIT capsule Take 1,000 Units by mouth daily.    [provider]      Allergies    Bee venom, Azithromycin, Diphenhydramine hcl, Erythromycin, Morphine and codeine, Penicillins, Percocet [oxycodone-acetaminophen], Sulfonamide derivatives, Latex, and Tetracyclines & related    Review of Systems   Review of Systems  Constitutional:  Negative for fever.  Neurological:  Positive for seizures. Negative for weakness.    Physical Exam Updated Vital Signs BP 116/71   Pulse (!) 101   Temp 98.2 F (36.8 C)  Resp 16   SpO2 100%  Physical Exam CONSTITUTIONAL: Well developed/well nourished HEAD: Normocephalic/atraumatic, no visible head trauma EYES: EOMI/PERRL, no nystagmus, no ptosis ENMT: Mucous membranes moist NECK: supple no meningeal signs No spinal tenderness is noted No bruising/crepitance/stepoffs noted to spine CV: S1/S2 noted, no murmurs/rubs/gallops noted LUNGS: Lungs are clear to auscultation bilaterally, no apparent distress ABDOMEN: soft, nontender, NEURO:Awake/alert, face symmetric, no arm or leg drift is noted Equal 5/5  strength with shoulder abduction, elbow flex/extension, wrist flex/extension in upper extremities and equal hand grips bilaterally Equal 5/5 strength with hip flexion,knee flex/extension, foot dorsi/plantar flexion Cranial nerves 3/4/5/6/02/16/09/11/12 tested and intact No past pointing Sensation to light touch intact in all extremities EXTREMITIES: pulses normal, full ROM, no deformities SKIN: warm, color normal PSYCH: no abnormalities of mood noted  ED Results / Procedures / Treatments   Labs (all labs ordered are listed, but only abnormal results are displayed) Labs Reviewed  COMPREHENSIVE METABOLIC PANEL  CBC WITH DIFFERENTIAL/PLATELET  MAGNESIUM    EKG EKG Interpretation Date/Time:  Thursday October 22 2023 23:00:57 EDT Ventricular Rate:  97 PR Interval:  160 QRS Duration:  106 QT Interval:  356 QTC Calculation: 453 R Axis:   92  Text Interpretation: Sinus rhythm Borderline right axis deviation Baseline wander in lead(s) I aVL Confirmed by Zadie Rhine ([id number]) on 10/22/2023 11:14:17 PM  Radiology No results found.  Procedures Procedures  {Document cardiac monitor, telemetry assessment procedure when appropriate:1}  Medications Ordered in ED Medications  acetaminophen (TYLENOL) tablet 650 mg (650 mg Oral Given 10/22/23 2331)    ED Course/ Medical Decision Making/ A&P Clinical Course as of 10/22/23 2335  Thu Oct 22, 2023  2315 Patient appears back to her baseline.  She is awake and alert.  Prehospital EKG was reviewed, no acute changes.. Reviewed documentation from previous admissions, previous history of TIA versus seizure versus complicated migraine. No signs of acute CVA at this time.  Will continue to monitor.  No indication for neuroimaging at this time as there is no signs of head trauma [DW]    Clinical Course User Index [DW] Zadie Rhine, MD   {   Click here for ABCD2, HEART and other calculatorsREFRESH Note before signing :1}                               Medical Decision Making Amount and/or Complexity of Data Reviewed Labs: ordered.  Risk OTC drugs.   This patient presents to the ED for concern of seizure, this involves an extensive number of treatment options, and is a complaint that carries with it a high risk of complications and morbidity.  The differential diagnosis includes but is not limited to status epilepticus, toxidrome, psychogenic nonepileptic seizure, complicated migraine, CVA, intracranial hemorrhage  Comorbidities that complicate the patient evaluation: Patient's presentation is complicated by their history of migraines and fibromyalgia  Social Determinants of Health: Patient's lack of insurance  increases the complexity of managing their presentation  Additional history obtained: Records reviewed previous admission documents and Care Everywhere/External Records  Lab Tests: I Ordered, and personally interpreted labs.  The pertinent results include:  ***  Imaging Studies ordered: I ordered imaging studies including {imaging:26848}  I independently visualized and interpreted imaging which showed *** I agree with the radiologist interpretation  Cardiac Monitoring: The patient was maintained on a cardiac monitor.  I personally viewed and interpreted the cardiac monitor which showed an underlying rhythm of:  {cardiac monitor:26849}  Medicines ordered and prescription drug management: I ordered medication including Tylenol for headache Reevaluation of the patient after these medicines showed that the patient    {resolved/improved/worsened:23923::"improved"}  Test Considered: Patient is low risk / negative by ***, therefore do not feel that *** is indicated.  Critical Interventions:  ***  Consultations Obtained: I requested consultation with the {consultation:26851}, and discussed  findings as well as pertinent plan - they recommend: ***  Reevaluation: After the interventions noted above, I reevaluated the  patient and found that they have :{resolved/improved/worsened:23923::"improved"}  Complexity of problems addressed: Patient's presentation is most consistent with  {WUJW:11914}  Disposition: After consideration of the diagnostic results and the patient's response to treatment,  I feel that the patent would benefit from {disposition:26850}.     {Document critical care time when appropriate:1} {Document review of labs and clinical decision tools ie heart score, Chads2Vasc2 etc:1}  {Document your independent review of radiology images, and any outside records:1} {Document your discussion with family members, caretakers, and with consultants:1} {Document social determinants of health affecting pt's care:1} {Document your decision making why or why not admission, treatments were needed:1} Final Clinical Impression(s) / ED Diagnoses Final diagnoses:  None    Rx / DC Orders ED Discharge Orders     None

## 2023-10-23 LAB — COMPREHENSIVE METABOLIC PANEL
ALT: 29 U/L (ref 0–44)
AST: 29 U/L (ref 15–41)
Albumin: 4.2 g/dL (ref 3.5–5.0)
Alkaline Phosphatase: 64 U/L (ref 38–126)
Anion gap: 10 (ref 5–15)
BUN: 19 mg/dL (ref 6–20)
CO2: 22 mmol/L (ref 22–32)
Calcium: 9.2 mg/dL (ref 8.9–10.3)
Chloride: 110 mmol/L (ref 98–111)
Creatinine, Ser: 1.01 mg/dL — ABNORMAL HIGH (ref 0.44–1.00)
GFR, Estimated: >60 mL/min (ref >60)
Glucose, Bld: 104 mg/dL — ABNORMAL HIGH (ref 70–99)
Potassium: 3.7 mmol/L (ref 3.5–5.1)
Sodium: 142 mmol/L (ref 135–145)
Total Bilirubin: 0.3 mg/dL (ref 0.0–1.2)
Total Protein: 7.4 g/dL (ref 6.5–8.1)

## 2023-10-23 LAB — CBC WITH DIFFERENTIAL/PLATELET
Abs Immature Granulocytes: 0.02 10*3/uL (ref 0.00–0.07)
Basophils Absolute: 0.1 10*3/uL (ref 0.0–0.1)
Basophils Relative: 1 %
Eosinophils Absolute: 0.2 10*3/uL (ref 0.0–0.5)
Eosinophils Relative: 2 %
HCT: 40.9 % (ref 36.0–46.0)
Hemoglobin: 13.3 g/dL (ref 12.0–15.0)
Immature Granulocytes: 0 %
Lymphocytes Relative: 41 %
Lymphs Abs: 3.3 10*3/uL (ref 0.7–4.0)
MCH: 30.9 pg (ref 26.0–34.0)
MCHC: 32.5 g/dL (ref 30.0–36.0)
MCV: 94.9 fL (ref 80.0–100.0)
Monocytes Absolute: 0.7 10*3/uL (ref 0.1–1.0)
Monocytes Relative: 8 %
Neutro Abs: 4 10*3/uL (ref 1.7–7.7)
Neutrophils Relative %: 48 %
Platelets: 255 10*3/uL (ref 150–400)
RBC: 4.31 MIL/uL (ref 3.87–5.11)
RDW: 13.3 % (ref 11.5–15.5)
WBC: 8.1 10*3/uL (ref 4.0–10.5)
nRBC: 0 % (ref 0.0–0.2)

## 2023-10-23 LAB — MAGNESIUM: Magnesium: 2.3 mg/dL (ref 1.7–2.4)

## 2023-10-23 NOTE — Discharge Instructions (Addendum)
 Please be aware you may have another seizure  Do not drive until seen by your physician for your condition  Do not climb ladders/roofs/trees as a seizure can occur at that height and cause serious harm  Do not bathe/swim alone as a seizure can occur and cause serious harm  Please followup with your physician or neurologist for further testing and possible treatment

## 2023-10-23 NOTE — ED Notes (Signed)
 Pt ambulated to nurse's station with a steady gait.

## 2023-10-26 DIAGNOSIS — R569 Unspecified convulsions: Secondary | ICD-10-CM | POA: Diagnosis not present

## 2023-10-26 DIAGNOSIS — R4789 Other speech disturbances: Secondary | ICD-10-CM | POA: Diagnosis not present

## 2023-10-26 DIAGNOSIS — G43019 Migraine without aura, intractable, without status migrainosus: Secondary | ICD-10-CM | POA: Diagnosis not present

## 2023-10-29 NOTE — Progress Notes (Unsigned)
 Chief Complaint:follow-up  Primary GI Doctor: Dr. Barron Alvine  HPI:  58 year old female with a history of migraines, depression, fibromyalgia, interstitial cystitis, ccy, partial hysterectomy, benign liver cysts, admitted on 07/03/2020 with possible new onset seizures.  GI service was consulted due to RUQ pain times months and dysphagia. -CT A/P: Stable 3.8 x 2.8 cm cystic areas in the posterior liver dome with adjacent stable subcentimeter liver cyst. Similar to 06/2018. -EGD (07/09/2020, Dr. Barron Alvine): Normal Z-line, normal esophagus with empiric 54 French Maloney dilation with mucosal rent at 18 cm c/w successful proximal dilation.  Esophageal biopsies with mild reflux changes in distal esophagus; normal proximal.  Non-H. pylori gastritis.  Started pantoprazole 40 mg/day, famotidine 20 mg every morning, Carafate 1 g qid. -Worsening abdominal pain with Carafate so this was discontinued - 07/31/2020: Follow-up in GI clinic.  Continued mild RUQ pain, sometimes worse after eating.   - 10/09/2020: Follow-up in the GI clinic.  Feeling much better, reflux well controlled with Protonix and Pepcid qhs. Dysphagia resolved with previous Maloney dilation. --10/01/21 colonoscopy ordered, full report below --11/02/23 hemorrhoid banding in office  --12/02/21 hemorrhoid banding #2 --01/10/22 hemorrhoid banding #3  Has since followed with neurology at [facility].  Diagnosed with nonepileptic spells.   -Colonoscopy 2004 at outside facility was unremarkable .  Due for repeat colonoscopy for ongoing CRC screening. - Colonoscopy (10/30/2021): Normal colon.  Grade 2 internal hemorrhoids.  Normal TI.  Repeat 10 years   Interval History  Patient admits/denies GERD Pantoprazole 40 mg twice daily Patient admits/denies dysphagia Patient admits/denies nausea, vomiting, or weight loss  Patient admits/denies altered bowel habits Patient admits/denies abdominal pain Patient admits/denies rectal bleeding   Denies/Admits  alcohol Denies/Admits smoking Denies/Admits NSAID use. Denies/Admits they are on blood thinners.  Patients last colonoscopy Patients last EGD  Patient's family history includes  Wt Readings from Last 3 Encounters:  08/03/23 144 lb (65.3 kg)  07/21/23 144 lb (65.3 kg)  06/15/23 142 lb (64.4 kg)      Past Medical History:  Diagnosis Date   Depression    Fibromyalgia    GERD (gastroesophageal reflux disease)    Migraine    PONV (postoperative nausea and vomiting)    TIA (transient ischemic attack)     Past Surgical History:  Procedure Laterality Date   AUGMENTATION MAMMAPLASTY Bilateral    Implants   BIOPSY  07/06/2020   Procedure: BIOPSY;  Surgeon: Shellia Cleverly, DO;  Location: [facility];  Service: Gastroenterology;;   CHOLECYSTECTOMY     ESOPHAGOGASTRODUODENOSCOPY Left 07/06/2020   Procedure: ESOPHAGOGASTRODUODENOSCOPY (EGD);  Surgeon: Shellia Cleverly, DO;  Location: [facility];  Service: Gastroenterology;  Laterality: Left;   ETHMOIDECTOMY Bilateral 09/09/2016   Procedure: ETHMOIDECTOMY;  Surgeon: Newman Pies, MD;  Location: [facility];  Service: ENT;  Laterality: Bilateral;   FRONTAL SINUS EXPLORATION Bilateral 09/09/2016   Procedure: FRONTAL RECESS SINUS EXPLORATION;  Surgeon: Newman Pies, MD;  Location: [facility];  Service: ENT;  Laterality: Bilateral;   MALONEY DILATION  07/06/2020   Procedure: Elease Hashimoto DILATION;  Surgeon: Shellia Cleverly, DO;  Location: [facility];  Service: Gastroenterology;;   MAXILLARY ANTROSTOMY Bilateral 09/09/2016   Procedure: MAXILLARY ANTROSTOMY WITH TISSUE REMOVAL;  Surgeon: Newman Pies, MD;  Location: [facility];  Service: ENT;  Laterality: Bilateral;   OVARIAN CYST SURGERY     SINUS ENDO W/FUSION Bilateral 09/09/2016   Procedure: ENDOSCOPIC SINUS SURGERY WITH NAVIGATION;  Surgeon: Newman Pies, MD;  Location: [facility];  Service: ENT;  Laterality: Bilateral;   TOTAL VAGINAL  HYSTERECTOMY     TURBINATE REDUCTION Bilateral 09/09/2016   Procedure: BILATERAL TURBINATE REDUCTION;  Surgeon: Newman Pies, MD;  Location: [facility];  Service: ENT;  Laterality: Bilateral;    Current Outpatient Medications  Medication Sig Dispense Refill   DULoxetine (CYMBALTA) 30 MG capsule TAKE 1 CAPSULE BY MOUTH DAILY 90 capsule 1   ezetimibe (ZETIA) 10 MG tablet Take 1 tablet (10 mg total) by mouth daily. 90 tablet 3   fexofenadine (ALLEGRA) 180 MG tablet Take 180 mg by mouth as directed. Monday Wednesday and Friday     levocetirizine (XYZAL) 5 MG tablet Take 5 mg by mouth as directed. Tuesday Thursday Saturday and Sunday     magnesium 30 MG tablet Take 30 mg by mouth every other day.     nitrofurantoin, macrocrystal-monohydrate, (MACROBID) 100 MG capsule Take 1 capsule (100 mg total) by mouth at bedtime. 30 capsule 3   pantoprazole (PROTONIX) 40 MG tablet Take 1 tablet (40 mg total) by mouth 2 (two) times daily before a meal. Please keep appointment for 10-12-23 at 1:40 pm for further refills 60 tablet 1   rosuvastatin (CRESTOR) 40 MG tablet Take 1 tablet (40 mg total) by mouth daily. 90 tablet 1   Suvorexant (BELSOMRA) 10 MG TABS Take 1 tablet (10 mg total) by mouth at bedtime. (Patient not taking: Reported on 09/10/2023) 30 tablet 3   topiramate (TOPAMAX) 100 MG tablet Take 100 mg by mouth 2 (two) times daily.     VITAMIN D PO Take 1,000 Units by mouth daily.     vitamin E 1000 UNIT capsule Take 1,000 Units by mouth daily.     No current facility-administered medications for this visit.    Allergies as of 10/30/2023 - Reviewed 10/22/2023  Allergen Reaction Noted   Bee venom Anaphylaxis 03/14/2013   Azithromycin  10/19/2009   Diphenhydramine hcl  10/19/2009   Erythromycin  10/19/2009   Morphine and codeine Nausea And Vomiting 12/22/2014   Penicillins  10/19/2009   Percocet [oxycodone-acetaminophen] Other (See Comments) 09/09/2016   Sulfonamide derivatives  10/19/2009    Latex Swelling and Rash 10/19/2009   Tetracyclines & related Rash 01/16/2012    Family History  Problem Relation Age of Onset   Kidney Stones Daughter    Migraines Mother    Heart attack Father 90   Diabetes Maternal Grandmother    Stroke Sister    Migraines Brother    Stroke Brother    Heart disease Brother    Migraines Other        many family members on maternal side    Colon cancer Neg Hx    Pancreatic cancer Neg Hx    Esophageal cancer Neg Hx     Review of Systems:    Constitutional: No weight loss, fever, chills, weakness or fatigue HEENT: Eyes: No change in vision               Ears, Nose, Throat:  No change in hearing or congestion Skin: No rash or itching Cardiovascular: No chest pain, chest pressure or palpitations   Respiratory: No SOB or cough Gastrointestinal: See HPI and otherwise negative Genitourinary: No dysuria or change in urinary frequency Neurological: No headache, dizziness or syncope Musculoskeletal: No new muscle or joint pain Hematologic: No bleeding or bruising Psychiatric: No history of depression or anxiety    Physical Exam:  Vital signs: There were no vitals taken for this visit.  Constitutional:  Pleasant *** female appears to be in NAD, Well developed, Well nourished, alert and cooperative Head:  Normocephalic and atraumatic. Eyes:   PEERL, EOMI. No icterus. Conjunctiva pink. Ears:  Normal auditory acuity. Neck:  Supple Throat: Oral cavity and pharynx without inflammation, swelling or lesion.  Respiratory: Respirations even and unlabored. Lungs clear to auscultation bilaterally.   No wheezes, crackles, or rhonchi.  Cardiovascular: Normal S1, S2. Regular rate and rhythm. No peripheral edema, cyanosis or pallor.  Gastrointestinal:  Soft, nondistended, nontender. No rebound or guarding. Normal bowel sounds. No appreciable masses or hepatomegaly. Rectal:  Not performed.  Anoscopy: Msk:  Symmetrical without gross deformities. Without  edema, no deformity or joint abnormality.  Neurologic:  Alert and  oriented x4;  grossly normal neurologically.  Skin:   Dry and intact without significant lesions or rashes. Psychiatric: Oriented to person, place and time. Demonstrates good judgement and reason without abnormal affect or behaviors.  RELEVANT LABS AND IMAGING: CBC    Latest Ref Rng & Units 10/22/2023   11:32 PM 06/15/2023   11:43 AM 06/09/2023    4:11 AM  CBC  WBC 4.0 - 10.5 K/uL 8.1  8.3  7.4   Hemoglobin 12.0 - 15.0 g/dL 16.1  09.6  04.5   Hematocrit 36.0 - 46.0 % 40.9  39.4  31.2   Platelets 150 - 400 K/uL 255  330.0  240      CMP     Latest Ref Rng & Units 10/22/2023   11:32 PM 06/15/2023   11:43 AM 06/09/2023    4:11 AM  CMP  Glucose 70 - 99 mg/dL 409  85  811   BUN 6 - 20 mg/dL 19  23  21    Creatinine 0.44 - 1.00 mg/dL 9.14  7.82  9.56   Sodium 135 - 145 mmol/L 142  142  141   Potassium 3.5 - 5.1 mmol/L 3.7  4.1  3.7   Chloride 98 - 111 mmol/L 110  106  112   CO2 22 - 32 mmol/L 22  28  23    Calcium 8.9 - 10.3 mg/dL 9.2  9.9  8.6   Total Protein 6.5 - 8.1 g/dL 7.4  7.7  5.6   Total Bilirubin 0.0 - 1.2 mg/dL 0.3  0.3  0.3   Alkaline Phos 38 - 126 U/L 64  75  58   AST 15 - 41 U/L 29  21  16    ALT 0 - 44 U/L 29  19  14       Lab Results  Component Value Date   TSH 1.68 06/15/2023     Assessment: 1. ***  Plan: 1. ***   Thank you for the courtesy of this consult. Please call me with any questions or concerns.   Memori Sammon, FNP-C [facility] 10/29/2023, 4:28 PM  Cc: Sheliah Hatch, MD

## 2023-10-30 ENCOUNTER — Encounter: Payer: Self-pay | Admitting: Gastroenterology

## 2023-10-30 ENCOUNTER — Ambulatory Visit (INDEPENDENT_AMBULATORY_CARE_PROVIDER_SITE_OTHER): Payer: Self-pay | Admitting: Gastroenterology

## 2023-10-30 VITALS — BP 108/72 | HR 88 | Ht 62.0 in | Wt 143.0 lb

## 2023-10-30 DIAGNOSIS — K644 Residual hemorrhoidal skin tags: Secondary | ICD-10-CM | POA: Diagnosis not present

## 2023-10-30 DIAGNOSIS — K219 Gastro-esophageal reflux disease without esophagitis: Secondary | ICD-10-CM

## 2023-10-30 DIAGNOSIS — K641 Second degree hemorrhoids: Secondary | ICD-10-CM

## 2023-10-30 DIAGNOSIS — R1319 Other dysphagia: Secondary | ICD-10-CM

## 2023-10-30 MED ORDER — PANTOPRAZOLE SODIUM 40 MG PO TBEC: 40.0000 mg | DELAYED_RELEASE_TABLET | Freq: Two times a day (BID) | ORAL | 1 refills | Status: DC

## 2023-10-30 NOTE — Patient Instructions (Addendum)
 Continue Pantoprazole 40 mg po BID, refill sent If swallowing issues persist or gets worse, please contact office to set up endoscopy.  We have sent the following medications to your pharmacy for you to pick up at your convenience:  Pantoprazole 40mg  twice daily  Follow up in a year  _______________________________________________________  If your blood pressure at your visit was 140/90 or greater, please contact your primary care physician to follow up on this.  _______________________________________________________  If you are age 58 or older, your body mass index should be between 23-30. Your Body mass index is 26.16 kg/m. If this is out of the aforementioned range listed, please consider follow up with your Primary Care Provider.  If you are age 46 or younger, your body mass index should be between 19-25. Your Body mass index is 26.16 kg/m. If this is out of the aformentioned range listed, please consider follow up with your Primary Care Provider.   ________________________________________________________  The [place] providers would like to encourage you to use [facility] to communicate with providers for non-urgent requests or questions.  Due to long hold times on the telephone, sending your provider a message by [facility] may be a faster and more efficient way to get a response.  Please allow 48 business hours for a response.  Please remember that this is for non-urgent requests.  _______________________________________________________ Thank you for trusting me with your gastrointestinal care!   Margarite Gouge May, NP

## 2023-11-02 NOTE — Progress Notes (Signed)
 Agree with the assessment and plan as outlined by [facility], FNP-C.  Martha Bottino, DO, [facility]

## 2023-11-05 ENCOUNTER — Telehealth (HOSPITAL_COMMUNITY): Payer: Self-pay | Admitting: *Deleted

## 2023-11-05 ENCOUNTER — Encounter (HOSPITAL_COMMUNITY): Payer: Self-pay

## 2023-11-05 ENCOUNTER — Other Ambulatory Visit (HOSPITAL_COMMUNITY): Payer: Self-pay | Admitting: *Deleted

## 2023-11-05 ENCOUNTER — Other Ambulatory Visit (HOSPITAL_COMMUNITY): Payer: Self-pay

## 2023-11-05 MED ORDER — IVABRADINE HCL 7.5 MG PO TABS
ORAL_TABLET | ORAL | 0 refills | Status: DC
  Filled 2023-11-05: qty 2, 1d supply, fill #0

## 2023-11-05 MED ORDER — IVABRADINE HCL 7.5 MG PO TABS: ORAL_TABLET | ORAL | 0 refills | Status: DC

## 2023-11-05 NOTE — Telephone Encounter (Signed)
 Reaching out to patient to offer assistance regarding upcoming cardiac imaging study; pt verbalizes understanding of appt date/time, parking situation and where to check in, pre-test NPO status and medications ordered, and verified current allergies; name and call back number provided for further questions should they arise  Larey Brick RN Navigator Cardiac Imaging [facility] [phone] office [phone] cell  Patient to take 15mg  ivabradine two hours prior to her cardiac CT scan.

## 2023-11-06 ENCOUNTER — Ambulatory Visit (HOSPITAL_COMMUNITY): Admission: RE | Admit: 2023-11-06 | Payer: Self-pay | Source: Ambulatory Visit

## 2023-11-16 ENCOUNTER — Telehealth: Payer: Self-pay

## 2023-11-16 ENCOUNTER — Ambulatory Visit (HOSPITAL_COMMUNITY)

## 2023-11-16 DIAGNOSIS — R072 Precordial pain: Secondary | ICD-10-CM

## 2023-11-16 DIAGNOSIS — E785 Hyperlipidemia, unspecified: Secondary | ICD-10-CM

## 2023-11-16 DIAGNOSIS — Z8249 Family history of ischemic heart disease and other diseases of the circulatory system: Secondary | ICD-10-CM

## 2023-11-16 NOTE — Telephone Encounter (Signed)
-----   Message from Christell Constant sent at 11/13/2023  1:56 PM EDT ----- Regarding: RE: test redirection She did not have exertional symptoms.  Exercise NM Stress due to CP and insurance issues would be recommended. ----- Message ----- From: Sherryl Manges Sent: 11/13/2023   8:17 AM EDT To: Christell Constant, MD; # Subject: test redirection                               Good morning,  Patients insurance is still pending for the CTA. I spoke with insurance this morning and they stated that the office notes would need to mention exertional chest pain for approval or they would recommend a test redirection to an exercise stress test.   Thank you

## 2023-11-16 NOTE — Telephone Encounter (Signed)
 Called pt to review MD recommendations.  Insurance will not cover Cardiac CT pt to have a NMST- exercise instead.

## 2023-11-17 NOTE — Telephone Encounter (Signed)
 Left a message to call back.

## 2023-12-03 ENCOUNTER — Encounter: Payer: Self-pay | Admitting: *Deleted

## 2023-12-03 ENCOUNTER — Ambulatory Visit: Payer: Commercial Managed Care - HMO | Admitting: Nurse Practitioner

## 2023-12-03 ENCOUNTER — Encounter: Payer: Self-pay | Admitting: Internal Medicine

## 2023-12-03 ENCOUNTER — Telehealth: Payer: Self-pay

## 2023-12-03 ENCOUNTER — Telehealth: Payer: Self-pay | Admitting: Pharmacy Technician

## 2023-12-03 ENCOUNTER — Other Ambulatory Visit (HOSPITAL_COMMUNITY): Payer: Self-pay

## 2023-12-03 DIAGNOSIS — R072 Precordial pain: Secondary | ICD-10-CM

## 2023-12-03 NOTE — Telephone Encounter (Addendum)
 Pharmacy Patient Advocate Encounter   Received notification from Patient Advice Request messagesthat prior authorization for Belsomra  10MG  tablets is required/requested.   Insurance verification completed.   The patient is insured through [facility] .   Per test claim: PA required; PA submitted to above mentioned insurance via CoverMyMeds Key/confirmation #/EOC [id number] Status is pending

## 2023-12-03 NOTE — Telephone Encounter (Signed)
 Called and spoke w the patient's husband, Martha Moss (DPR).  He states he is upset because he's not heard what the plan is going forward as the CT auth could not be completed.  I let him know that Shamea's left 2 VM messages to call to discuss the NM stress test.   He also said he has tried to call and give messages to [facility] to call him.  I brought him up to date with Dr. Paulita Boss requesting the lexiscan. Instructions sent via my chart.  He specifically asks if the lexiscan will be okay for the patient who is having seizures frequently.  Adv I would route this to Dr. Paulita Boss for an answer and we will let him know.  Meanwhile we will order the test and send the instructions via patient portal.  Pt appreciative for assistance.  Pt requests further calls be made to his phone since his wife w her seizures can be out of commission for 1-2 days afterward.

## 2023-12-03 NOTE — Telephone Encounter (Signed)
 PA request has been Started. New Encounter has been or will be created for follow up. For additional info see Pharmacy Prior Auth telephone encounter from 12/03/2023.

## 2023-12-03 NOTE — Telephone Encounter (Signed)
 Pt needs a PA for Suvorexant  (BELSOMRA ) 10 MG TABS

## 2023-12-03 NOTE — Telephone Encounter (Signed)
 Copied from CRM [id number]. Topic: Clinical - Medication Question >> Dec 02, 2023  3:48 PM Allyne Areola wrote: Reason for CRM: Stacy from [facility] is calling to inform that patient's prescription for Suvorexant  (BELSOMRA ) 10 MG TABS requires a prior authorization and they wanted to know if it has been submitted yet. Best call back number [phone].

## 2023-12-04 ENCOUNTER — Other Ambulatory Visit (HOSPITAL_COMMUNITY): Payer: Self-pay

## 2023-12-04 NOTE — Telephone Encounter (Signed)
 Pharmacy Patient Advocate Encounter  Received notification from [facility] that Prior Authorization for Belsomra  10MG  tablets has been  CLOSED   PA #/Case ID/Reference #: [id number]  FAXED REQUEST TODAY AT 1:20PM

## 2023-12-04 NOTE — Telephone Encounter (Signed)
 Pharmacy Patient Advocate Encounter  Received notification from [employer] that Prior Authorization for Belsomra  10MG  tablets has been APPROVED from 12/04/23 to 06/04/24. Ran test claim, Copay is $4. This test claim was processed through [facility]- copay amounts may vary at other pharmacies due to pharmacy/plan contracts, or as the patient moves through the different stages of their insurance plan.   PA #/Case ID/Reference #: [id number]

## 2023-12-07 ENCOUNTER — Telehealth: Payer: Self-pay

## 2023-12-07 NOTE — Telephone Encounter (Signed)
 Copied from CRM [id number]. Topic: Referral - Prior Authorization Question >> Dec 04, 2023  2:59 PM Caliyah H wrote: Reason for CRM: Stacy from [facility] called regarding the status of the prior authorization request for Belsomra .  Callback Number: [phone]

## 2023-12-08 ENCOUNTER — Encounter: Payer: Self-pay | Admitting: Family Medicine

## 2023-12-08 MED ORDER — BELSOMRA 10 MG PO TABS: 1.0000 | ORAL_TABLET | Freq: Every evening | ORAL | 3 refills | Status: DC

## 2023-12-08 NOTE — Telephone Encounter (Signed)
 Patient notes needs resend of Belsomra  per insurance Will have pt fast for appt checking cholesterol

## 2023-12-11 NOTE — Telephone Encounter (Signed)
 Appeal completed pt approved to have Cardiac CT.  Stress test not needed.

## 2023-12-14 ENCOUNTER — Encounter: Payer: Self-pay | Admitting: Family Medicine

## 2023-12-14 ENCOUNTER — Ambulatory Visit: Payer: Managed Care, Other (non HMO) | Admitting: Family Medicine

## 2023-12-14 VITALS — BP 122/70 | HR 72 | Temp 97.8°F | Ht 62.0 in | Wt 145.1 lb

## 2023-12-14 DIAGNOSIS — R569 Unspecified convulsions: Secondary | ICD-10-CM | POA: Diagnosis not present

## 2023-12-14 DIAGNOSIS — E785 Hyperlipidemia, unspecified: Secondary | ICD-10-CM | POA: Diagnosis not present

## 2023-12-14 DIAGNOSIS — G4700 Insomnia, unspecified: Secondary | ICD-10-CM

## 2023-12-14 LAB — CBC WITH DIFFERENTIAL/PLATELET
Basophils Absolute: 0 10*3/uL (ref 0.0–0.1)
Basophils Relative: 0.5 % (ref 0.0–3.0)
Eosinophils Absolute: 0.1 10*3/uL (ref 0.0–0.7)
Eosinophils Relative: 1.8 % (ref 0.0–5.0)
HCT: 39 % (ref 36.0–46.0)
Hemoglobin: 12.8 g/dL (ref 12.0–15.0)
Lymphocytes Relative: 42.3 % (ref 12.0–46.0)
Lymphs Abs: 2.3 10*3/uL (ref 0.7–4.0)
MCHC: 32.9 g/dL (ref 30.0–36.0)
MCV: 94.2 fl (ref 78.0–100.0)
Monocytes Absolute: 0.4 10*3/uL (ref 0.1–1.0)
Monocytes Relative: 7.2 % (ref 3.0–12.0)
Neutro Abs: 2.7 10*3/uL (ref 1.4–7.7)
Neutrophils Relative %: 48.2 % (ref 43.0–77.0)
Platelets: 279 10*3/uL (ref 150.0–400.0)
RBC: 4.14 Mil/uL (ref 3.87–5.11)
RDW: 13.7 % (ref 11.5–15.5)
WBC: 5.5 10*3/uL (ref 4.0–10.5)

## 2023-12-14 LAB — LIPID PANEL
Cholesterol: 154 mg/dL (ref 0–200)
HDL: 65.3 mg/dL (ref >39.00)
LDL Cholesterol: 75 mg/dL (ref 0–99)
NonHDL: 89.14
Total CHOL/HDL Ratio: 2
Triglycerides: 73 mg/dL (ref 0.0–149.0)
VLDL: 14.6 mg/dL (ref 0.0–40.0)

## 2023-12-14 LAB — BASIC METABOLIC PANEL WITH GFR
BUN: 25 mg/dL — ABNORMAL HIGH (ref 6–23)
CO2: 23 meq/L (ref 19–32)
Calcium: 9.5 mg/dL (ref 8.4–10.5)
Chloride: 109 meq/L (ref 96–112)
Creatinine, Ser: 0.83 mg/dL (ref 0.40–1.20)
GFR: 77.89 mL/min (ref >60.00)
Glucose, Bld: 82 mg/dL (ref 70–99)
Potassium: 3.8 meq/L (ref 3.5–5.1)
Sodium: 142 meq/L (ref 135–145)

## 2023-12-14 LAB — HEPATIC FUNCTION PANEL
ALT: 23 U/L (ref 0–35)
AST: 27 U/L (ref 0–37)
Albumin: 4.5 g/dL (ref 3.5–5.2)
Alkaline Phosphatase: 72 U/L (ref 39–117)
Bilirubin, Direct: 0 mg/dL (ref 0.0–0.3)
Total Bilirubin: 0.4 mg/dL (ref 0.2–1.2)
Total Protein: 7.4 g/dL (ref 6.0–8.3)

## 2023-12-14 LAB — TSH: TSH: 1.98 u[IU]/mL (ref 0.35–5.50)

## 2023-12-14 MED ORDER — EPINEPHRINE 0.3 MG/0.3ML IJ SOAJ: 0.3000 mg | INTRAMUSCULAR | 1 refills | Status: AC | PRN

## 2023-12-14 MED ORDER — EPINEPHRINE 0.3 MG/0.3ML IJ SOAJ: 0.3000 mg | INTRAMUSCULAR | 1 refills | Status: DC | PRN

## 2023-12-14 NOTE — Patient Instructions (Signed)
 Schedule your complete physical in 6 months We'll notify you of your lab results and make any changes if needed I hope things improve w/ the Belsomra !! Keep me updated! Call with any questions or concerns Stay Safe!  Stay Healthy! Happy Mother's Day!!

## 2023-12-14 NOTE — Progress Notes (Unsigned)
   Subjective:    Patient ID: Martha Moss, female    DOB: 1966-02-12, 58 y.o.   MRN: [id number]  HPI Hyperlipidemia- chronic problem, on Zetia  10mg  daily, Crestor  40mg  daily.  Has occasional CP- has upcoming appt w/ Cardiology.  No SOB above baseline.  Occasional abd pain, no N/V.  Seizures- pt is following w/ Neuro.  Has been told that seizure activity is from previous TIAs.  Has upcoming 'brain scan'.  Neuro has increased Topamax .  Neuro feels that poor sleep is contributing to migraines which contributes to seizures.  Belsomra  finally got approved.     Review of Systems For ROS see HPI     Objective:   Physical Exam Vitals reviewed.  Constitutional:      General: She is not in acute distress.    Appearance: Normal appearance. She is well-developed. She is not ill-appearing.  HENT:     Head: Normocephalic and atraumatic.  Eyes:     Conjunctiva/sclera: Conjunctivae normal.     Pupils: Pupils are equal, round, and reactive to light.  Neck:     Thyroid : No thyromegaly.  Cardiovascular:     Rate and Rhythm: Normal rate and regular rhythm.     Pulses: Normal pulses.     Heart sounds: Normal heart sounds. No murmur heard. Pulmonary:     Effort: Pulmonary effort is normal. No respiratory distress.     Breath sounds: Normal breath sounds.  Abdominal:     General: There is no distension.     Palpations: Abdomen is soft.     Tenderness: There is no abdominal tenderness.  Musculoskeletal:     Cervical back: Normal range of motion and neck supple.     Right lower leg: No edema.     Left lower leg: No edema.  Lymphadenopathy:     Cervical: No cervical adenopathy.  Skin:    General: Skin is warm and dry.  Neurological:     General: No focal deficit present.     Mental Status: She is alert and oriented to person, place, and time.  Psychiatric:        Mood and Affect: Mood normal.        Behavior: Behavior normal.        Thought Content: Thought content normal.           Assessment & Plan:

## 2023-12-15 ENCOUNTER — Telehealth: Payer: Self-pay

## 2023-12-15 ENCOUNTER — Encounter: Payer: Self-pay | Admitting: Family Medicine

## 2023-12-15 NOTE — Telephone Encounter (Signed)
 Pt has reviewed labs via MyChart

## 2023-12-15 NOTE — Assessment & Plan Note (Signed)
 Chronic problem.  Currently on Crestor  40mg  daily and Zetia  10mg  daily.  Also following w/ Cardiology due to occasional CP.  Check labs.  Adjust meds prn

## 2023-12-15 NOTE — Telephone Encounter (Signed)
-----   Message from Laymon Priest sent at 12/15/2023  7:34 AM EDT ----- Labs look great!  No changes at this time

## 2023-12-15 NOTE — Assessment & Plan Note (Signed)
 Deteriorated.  Pt and husband report increased seizure activity recently and occurring multiple times weekly.  Following w/ neuro.  Will follow along and assist as able.

## 2023-12-15 NOTE — Assessment & Plan Note (Signed)
 Chronic problem.  Belsomra  finally got approved so hopefully this will improve both migraines and seizure activity.

## 2023-12-18 DIAGNOSIS — R569 Unspecified convulsions: Secondary | ICD-10-CM | POA: Diagnosis not present

## 2023-12-19 DIAGNOSIS — L820 Inflamed seborrheic keratosis: Secondary | ICD-10-CM | POA: Diagnosis not present

## 2023-12-19 DIAGNOSIS — D1721 Benign lipomatous neoplasm of skin and subcutaneous tissue of right arm: Secondary | ICD-10-CM | POA: Diagnosis not present

## 2023-12-19 DIAGNOSIS — L570 Actinic keratosis: Secondary | ICD-10-CM | POA: Diagnosis not present

## 2023-12-19 DIAGNOSIS — D485 Neoplasm of uncertain behavior of skin: Secondary | ICD-10-CM | POA: Diagnosis not present

## 2023-12-19 DIAGNOSIS — D225 Melanocytic nevi of trunk: Secondary | ICD-10-CM | POA: Diagnosis not present

## 2023-12-24 ENCOUNTER — Encounter (HOSPITAL_COMMUNITY): Payer: Self-pay

## 2023-12-25 ENCOUNTER — Telehealth (HOSPITAL_COMMUNITY): Payer: Self-pay | Admitting: *Deleted

## 2023-12-25 NOTE — Telephone Encounter (Signed)
 Reaching out to patient to offer assistance regarding upcoming cardiac imaging study; pt verbalizes understanding of appt date/time, parking situation and where to check in, pre-test NPO status and medications ordered, and verified current allergies; name and call back number provided for further questions should they arise  Larey Brick RN Navigator Cardiac Imaging [facility] [phone] office [phone] cell  Patient to take 15mg  ivabradine two hours prior to her cardiac CT scan.

## 2023-12-28 ENCOUNTER — Ambulatory Visit (HOSPITAL_COMMUNITY)
Admission: RE | Admit: 2023-12-28 | Discharge: 2023-12-28 | Disposition: A | Discharge status: Home / Self Care | Source: Ambulatory Visit | Attending: Internal Medicine | Admitting: Internal Medicine

## 2023-12-28 DIAGNOSIS — R072 Precordial pain: Secondary | ICD-10-CM | POA: Insufficient documentation

## 2023-12-28 MED ORDER — NITROGLYCERIN 0.4 MG SL SUBL
SUBLINGUAL_TABLET | SUBLINGUAL | Status: AC
Start: 2023-12-28 — End: ?
  Filled 2023-12-28: qty 2

## 2023-12-28 MED ORDER — IOHEXOL 350 MG/ML SOLN
100.0000 mL | Freq: Once | INTRAVENOUS | Status: AC | PRN
  Administered 2023-12-28: 100 mL via INTRAVENOUS

## 2023-12-29 ENCOUNTER — Ambulatory Visit: Payer: Self-pay | Admitting: Internal Medicine

## 2023-12-29 ENCOUNTER — Other Ambulatory Visit: Payer: Self-pay | Admitting: Family Medicine

## 2024-01-08 DIAGNOSIS — R4789 Other speech disturbances: Secondary | ICD-10-CM | POA: Diagnosis not present

## 2024-01-08 DIAGNOSIS — F445 Conversion disorder with seizures or convulsions: Secondary | ICD-10-CM | POA: Diagnosis not present

## 2024-01-08 DIAGNOSIS — R258 Other abnormal involuntary movements: Secondary | ICD-10-CM | POA: Diagnosis not present

## 2024-01-08 DIAGNOSIS — M5417 Radiculopathy, lumbosacral region: Secondary | ICD-10-CM | POA: Diagnosis not present

## 2024-01-08 DIAGNOSIS — Z823 Family history of stroke: Secondary | ICD-10-CM | POA: Diagnosis not present

## 2024-01-08 DIAGNOSIS — Z862 Personal history of diseases of the blood and blood-forming organs and certain disorders involving the immune mechanism: Secondary | ICD-10-CM | POA: Diagnosis not present

## 2024-01-08 DIAGNOSIS — Z1331 Encounter for screening for depression: Secondary | ICD-10-CM | POA: Diagnosis not present

## 2024-01-08 DIAGNOSIS — G43019 Migraine without aura, intractable, without status migrainosus: Secondary | ICD-10-CM | POA: Diagnosis not present

## 2024-01-11 ENCOUNTER — Encounter: Payer: Self-pay | Admitting: Family Medicine

## 2024-01-11 ENCOUNTER — Ambulatory Visit: Admitting: Family Medicine

## 2024-01-11 VITALS — BP 104/72 | HR 84 | Temp 98.0°F | Ht 62.0 in | Wt 144.0 lb

## 2024-01-11 DIAGNOSIS — F32A Depression, unspecified: Secondary | ICD-10-CM

## 2024-01-11 DIAGNOSIS — F419 Anxiety disorder, unspecified: Secondary | ICD-10-CM

## 2024-01-11 MED ORDER — BUSPIRONE HCL 7.5 MG PO TABS: 7.5000 mg | ORAL_TABLET | Freq: Two times a day (BID) | ORAL | 3 refills | Status: DC

## 2024-01-11 NOTE — Patient Instructions (Signed)
 Follow up in 3 weeks to recheck mood START the Buspirone twice daily to help w/ anxiety CONTINUE the Cymbalta  as directed Call with any questions or concerns Stay Safe!  Stay Healthy! Hang In There!

## 2024-01-11 NOTE — Progress Notes (Signed)
   Subjective:    Patient ID: Martha Moss, female    DOB: Aug 03, 1966, 58 y.o.   MRN: [id number]  HPI Anxiety- ongoing issue.  Currently on Cymbalta  30mg  daily.  'i guess I have anxiety and I don't realize it'.  Has been having '3 seizures a day'.  Has been under considerable stress.  Neuro encouraged her to come today to discuss adjusting medication.  Finds herself frequently exhausted.  Husband reports she tends to bottle up her feelings.  Anxiety triggers migraines which leads to 'seizures'.     Review of Systems For ROS see HPI     Objective:   Physical Exam Vitals reviewed.  Constitutional:      General: She is not in acute distress.    Appearance: Normal appearance. She is not ill-appearing.  HENT:     Head: Normocephalic and atraumatic.   Eyes:     Extraocular Movements: Extraocular movements intact.     Conjunctiva/sclera: Conjunctivae normal.    Cardiovascular:     Rate and Rhythm: Normal rate and regular rhythm.  Pulmonary:     Effort: Pulmonary effort is normal. No respiratory distress.   Skin:    General: Skin is warm and dry.   Neurological:     General: No focal deficit present.     Mental Status: She is alert and oriented to person, place, and time.   Psychiatric:        Mood and Affect: Mood normal.        Behavior: Behavior normal.        Thought Content: Thought content normal.           Assessment & Plan:

## 2024-01-15 ENCOUNTER — Other Ambulatory Visit: Payer: Self-pay

## 2024-01-15 ENCOUNTER — Telehealth: Payer: Self-pay | Admitting: Gastroenterology

## 2024-01-15 DIAGNOSIS — K219 Gastro-esophageal reflux disease without esophagitis: Secondary | ICD-10-CM

## 2024-01-15 MED ORDER — PANTOPRAZOLE SODIUM 40 MG PO TBEC
40.0000 mg | DELAYED_RELEASE_TABLET | Freq: Two times a day (BID) | ORAL | 11 refills | Status: AC
Start: 2024-01-15 — End: ?

## 2024-01-15 NOTE — Progress Notes (Signed)
 Medication refill

## 2024-01-15 NOTE — Telephone Encounter (Signed)
 Patient called and stated that she is needing her refill on Pantoprazole  sent over to her pharmacy as soon as possible due to her and her husband having to travel out of state because her mother in law is passing away. Patient is requesting a call back informing her that her prescription has been sent over. Please advise.

## 2024-01-19 ENCOUNTER — Encounter: Payer: Self-pay | Admitting: Family Medicine

## 2024-01-20 NOTE — Telephone Encounter (Signed)
 Patient's family member is messaging in stating she has had a increase in seizures since starting BusPIRone  7.5mg . They are wondering if this could be a side effect of the new medication?

## 2024-01-28 ENCOUNTER — Inpatient Hospital Stay: Admitting: Oncology

## 2024-01-28 ENCOUNTER — Encounter: Payer: Self-pay | Admitting: Oncology

## 2024-01-28 ENCOUNTER — Inpatient Hospital Stay

## 2024-01-28 VITALS — BP 109/75 | HR 90 | Temp 97.5°F | Resp 14 | Wt 143.0 lb

## 2024-01-28 DIAGNOSIS — Z8673 Personal history of transient ischemic attack (TIA), and cerebral infarction without residual deficits: Secondary | ICD-10-CM | POA: Insufficient documentation

## 2024-01-28 DIAGNOSIS — G459 Transient cerebral ischemic attack, unspecified: Secondary | ICD-10-CM

## 2024-01-28 NOTE — Progress Notes (Unsigned)
 [facility]  [phone] [phone]  ID: Martha Moss OB: 10-04-1965  MR#: [id number]  QMV#:[id number]  Patient Care Team: Jess Morita, MD as PCP - General (Family Medicine)  CHIEF COMPLAINT: History of TIA, low positive anticardiolipin IgM.  INTERVAL HISTORY: Patient is a 58 year old female with a history of TIA and seizures as well as an extensive family history of CVA.  Hypercoagulable workup recently completed by outside provider revealed a low positive anticardiolipin IgM antibody.  She is referred for further evaluation.  Currently, she only takes aspirin  as anticoagulation.  Currently feels well and is asymptomatic.  She has no neurologic complaints.  She denies any recent fevers or illnesses.  She has a good appetite and denies weight loss.  She has no chest pain, shortness of breath, cough, or hemoptysis.  She denies any nausea, vomiting, constipation, or diarrhea.  She has no urinary complaints.  Patient offers no further specific complaints today.  REVIEW OF SYSTEMS:   Review of Systems  Constitutional: Negative.  Negative for fever, malaise/fatigue and weight loss.  Respiratory: Negative.  Negative for cough, hemoptysis and shortness of breath.   Cardiovascular: Negative.  Negative for chest pain and leg swelling.  Gastrointestinal: Negative.  Negative for abdominal pain.  Genitourinary: Negative.  Negative for dysuria.  Musculoskeletal: Negative.  Negative for back pain.  Skin: Negative.  Negative for rash.  Neurological: Negative.  Negative for dizziness, focal weakness, weakness and headaches.  Psychiatric/Behavioral: Negative.  The patient is not nervous/anxious.     As per HPI. Otherwise, a complete review of systems is negative.  PAST MEDICAL HISTORY: Past Medical History:  Diagnosis Date   Depression    Fibromyalgia    GERD (gastroesophageal reflux disease)    Migraine    PONV (postoperative nausea and  vomiting)    TIA (transient ischemic attack)     PAST SURGICAL HISTORY: Past Surgical History:  Procedure Laterality Date   AUGMENTATION MAMMAPLASTY Bilateral    Implants   BIOPSY  07/06/2020   Procedure: BIOPSY;  Surgeon: Annis Kinder, DO;  Location: [facility];  Service: Gastroenterology;;   CHOLECYSTECTOMY     ESOPHAGOGASTRODUODENOSCOPY Left 07/06/2020   Procedure: ESOPHAGOGASTRODUODENOSCOPY (EGD);  Surgeon: Annis Kinder, DO;  Location: [facility];  Service: Gastroenterology;  Laterality: Left;   ETHMOIDECTOMY Bilateral 09/09/2016   Procedure: ETHMOIDECTOMY;  Surgeon: Reynold Caves, MD;  Location: [facility];  Service: ENT;  Laterality: Bilateral;   FRONTAL SINUS EXPLORATION Bilateral 09/09/2016   Procedure: FRONTAL RECESS SINUS EXPLORATION;  Surgeon: Reynold Caves, MD;  Location: [facility];  Service: ENT;  Laterality: Bilateral;   MALONEY DILATION  07/06/2020   Procedure: Londa Rival DILATION;  Surgeon: Annis Kinder, DO;  Location: [facility];  Service: Gastroenterology;;   MAXILLARY ANTROSTOMY Bilateral 09/09/2016   Procedure: MAXILLARY ANTROSTOMY WITH TISSUE REMOVAL;  Surgeon: Reynold Caves, MD;  Location: [facility];  Service: ENT;  Laterality: Bilateral;   OVARIAN CYST SURGERY     SINUS ENDO W/FUSION Bilateral 09/09/2016   Procedure: ENDOSCOPIC SINUS SURGERY WITH NAVIGATION;  Surgeon: Reynold Caves, MD;  Location: [facility];  Service: ENT;  Laterality: Bilateral;   TOTAL VAGINAL HYSTERECTOMY     TURBINATE REDUCTION Bilateral 09/09/2016   Procedure: BILATERAL TURBINATE REDUCTION;  Surgeon: Reynold Caves, MD;  Location: [facility];  Service: ENT;  Laterality: Bilateral;    FAMILY HISTORY: Family History  Problem Relation Age of Onset   Kidney Stones Daughter  Migraines Mother    Heart attack Father 61   Diabetes Maternal Grandmother    Stroke Sister    Migraines Brother    Stroke Brother    Heart  disease Brother    Migraines Other        many family members on maternal side    Colon cancer Neg Hx    Pancreatic cancer Neg Hx    Esophageal cancer Neg Hx     ADVANCED DIRECTIVES (Y/N):  N  HEALTH MAINTENANCE: Social History   Tobacco Use   Smoking status: Never   Smokeless tobacco: Never  Vaping Use   Vaping status: Never Used  Substance Use Topics   Alcohol use: Yes    Comment: rarely   Drug use: No     Colonoscopy:  PAP:  Bone density:  Lipid panel:  Allergies  Allergen Reactions   Bee Venom Anaphylaxis   Azithromycin     REACTION: hives   Diphenhydramine Hcl     REACTION: hives   Erythromycin     REACTION: hives   Morphine And Codeine Nausea And Vomiting   Penicillins     REACTION: rash, fever   Percocet [Oxycodone -Acetaminophen ] Other (See Comments)    Hallucinations    Sulfonamide Derivatives     REACTION: rash, fever   Latex Swelling and Rash    Face and tongue swelling   Tetracyclines & Related Rash    fever    Current Outpatient Medications  Medication Sig Dispense Refill   DULoxetine  (CYMBALTA ) 30 MG capsule TAKE 1 CAPSULE BY MOUTH DAILY 90 capsule 1   EPINEPHrine  0.3 mg/0.3 mL IJ SOAJ injection Inject 0.3 mg into the muscle as needed for anaphylaxis. 1 each 1   fexofenadine (ALLEGRA) 180 MG tablet Take 180 mg by mouth as directed. Monday Wednesday and Friday     levocetirizine (XYZAL ) 5 MG tablet Take 5 mg by mouth as directed. Tuesday Thursday Saturday and Sunday     magnesium  30 MG tablet Take 30 mg by mouth every other day.     nitrofurantoin , macrocrystal-monohydrate, (MACROBID ) 100 MG capsule Take 1 capsule (100 mg total) by mouth at bedtime. 30 capsule 3   pantoprazole  (PROTONIX ) 40 MG tablet Take 1 tablet (40 mg total) by mouth 2 (two) times daily before a meal. 60 tablet 11   rosuvastatin  (CRESTOR ) 40 MG tablet Take 1 tablet (40 mg total) by mouth daily. 90 tablet 1   Suvorexant  (BELSOMRA ) 10 MG TABS Take 1 tablet (10 mg total) by  mouth at bedtime. 30 tablet 3   topiramate  (TOPAMAX ) 100 MG tablet Take 150 mg by mouth 2 (two) times daily. 150MG  AT NIGHT     VITAMIN D  PO Take 1,000 Units by mouth daily.     vitamin E  1000 UNIT capsule Take 1,000 Units by mouth daily.     busPIRone  (BUSPAR ) 7.5 MG tablet Take 1 tablet (7.5 mg total) by mouth 2 (two) times daily. (Patient not taking: Reported on 01/28/2024) 60 tablet 3   ezetimibe  (ZETIA ) 10 MG tablet Take 1 tablet (10 mg total) by mouth daily. (Patient not taking: Reported on 01/28/2024) 90 tablet 3   ivabradine  (CORLANOR) 7.5 MG TABS tablet Take 2 tablets (15mg ) by mouth TWO hours prior to your cardiac CT scan. 2 tablet 0   No current facility-administered medications for this visit.    OBJECTIVE: Vitals:   01/28/24 1107  BP: 109/75  Pulse: 90  Resp: 14  Temp: (!) 97.5 F (36.4 C)  SpO2: 99%     Body mass index is 26.16 kg/m.    ECOG FS:0 - Asymptomatic  General: Well-developed, well-nourished, no acute distress. Eyes: Pink conjunctiva, anicteric sclera. HEENT: Normocephalic, moist mucous membranes. Lungs: No audible wheezing or coughing. Heart: Regular rate and rhythm. Abdomen: Soft, nontender, no obvious distention. Musculoskeletal: No edema, cyanosis, or clubbing. Neuro: Alert, answering all questions appropriately. Cranial nerves grossly intact. Skin: No rashes or petechiae noted. Psych: Normal affect. Lymphatics: No cervical, calvicular, axillary or inguinal LAD.   LAB RESULTS:  Lab Results  Component Value Date   NA 142 12/14/2023   K 3.8 12/14/2023   CL 109 12/14/2023   CO2 23 12/14/2023   GLUCOSE 82 12/14/2023   BUN 25 (H) 12/14/2023   CREATININE 0.83 12/14/2023   CALCIUM  9.5 12/14/2023   PROT 7.4 12/14/2023   ALBUMIN 4.5 12/14/2023   AST 27 12/14/2023   ALT 23 12/14/2023   ALKPHOS 72 12/14/2023   BILITOT 0.4 12/14/2023   GFRNONAA >60 10/22/2023   GFRAA >60 06/24/2018    Lab Results  Component Value Date   WBC 5.5 12/14/2023    NEUTROABS 2.7 12/14/2023   HGB 12.8 12/14/2023   HCT 39.0 12/14/2023   MCV 94.2 12/14/2023   PLT 279.0 12/14/2023     STUDIES: No results found.  ASSESSMENT: History of TIA, low positive anticardiolipin IgM  PLAN:    Low positive anticardiolipin IgM antibody: Laboratory work from Jan 08, 2024, patient was noted to have a mildly elevated level of 22 which is in the low positive range.  Unclear clinical significance without diagnosis of a definitive blood clot.  She does not meet criteria for antiphospholipid syndrome.  The remainder of her hypercoagulable workup was reported as negative.  No intervention is needed at this time.  Anticardiolipin antibodies can be transient in nature therefore will repeat laboratory work in 3 months with follow-up 1 to 2 days later.  Will defer any anticoagulation strategies to her primary neurologist.  TIA: Unclear etiology.  No obvious evidence of clot.  Patient currently on aspirin .  Follow-up with neurology as scheduled.   I spent a total of 60 minutes reviewing chart data, face-to-face evaluation with the patient, counseling and coordination of care as detailed above.  Patient expressed understanding and was in agreement with this plan. She also understands that She can call clinic at any time with any questions, concerns, or complaints.     Shellie Dials, MD   01/29/2024 8:35 AM

## 2024-01-30 DIAGNOSIS — L821 Other seborrheic keratosis: Secondary | ICD-10-CM | POA: Diagnosis not present

## 2024-01-30 DIAGNOSIS — D1721 Benign lipomatous neoplasm of skin and subcutaneous tissue of right arm: Secondary | ICD-10-CM | POA: Diagnosis not present

## 2024-01-30 DIAGNOSIS — D225 Melanocytic nevi of trunk: Secondary | ICD-10-CM | POA: Diagnosis not present

## 2024-01-30 DIAGNOSIS — D2239 Melanocytic nevi of other parts of face: Secondary | ICD-10-CM | POA: Diagnosis not present

## 2024-01-30 DIAGNOSIS — D485 Neoplasm of uncertain behavior of skin: Secondary | ICD-10-CM | POA: Diagnosis not present

## 2024-02-01 ENCOUNTER — Ambulatory Visit: Admitting: Family Medicine

## 2024-02-01 ENCOUNTER — Encounter: Payer: Self-pay | Admitting: Family Medicine

## 2024-02-01 VITALS — BP 140/80 | HR 82 | Resp 16 | Ht 62.0 in | Wt 143.6 lb

## 2024-02-01 DIAGNOSIS — F419 Anxiety disorder, unspecified: Secondary | ICD-10-CM

## 2024-02-01 DIAGNOSIS — F32A Depression, unspecified: Secondary | ICD-10-CM

## 2024-02-01 MED ORDER — DULOXETINE HCL 60 MG PO CPEP: 60.0000 mg | ORAL_CAPSULE | Freq: Every day | ORAL | 3 refills | Status: DC

## 2024-02-01 NOTE — Progress Notes (Signed)
   Subjective:    Patient ID: TIAH HECKEL, female    DOB: 1966/01/24, 58 y.o.   MRN: [id number]  HPI Anxiety- at last visit we started her on Buspar  7.5mg  BID.  A few days after this appt, they messaged indicating that seizure activity was worse.  Husband reports 'she was up to 10 seizures a day'.  Stopped medication, now having 1-3 episodes daily.  Husband reports neuro has not been helpful.     Review of Systems For ROS see HPI     Objective:   Physical Exam Vitals reviewed.  Constitutional:      General: She is not in acute distress.    Appearance: Normal appearance. She is not ill-appearing.  HENT:     Head: Normocephalic and atraumatic.   Eyes:     Extraocular Movements: Extraocular movements intact.     Conjunctiva/sclera: Conjunctivae normal.    Cardiovascular:     Rate and Rhythm: Normal rate and regular rhythm.  Pulmonary:     Effort: Pulmonary effort is normal. No respiratory distress.   Skin:    General: Skin is warm and dry.   Neurological:     General: No focal deficit present.     Mental Status: She is alert and oriented to person, place, and time.   Psychiatric:        Mood and Affect: Mood normal.        Behavior: Behavior normal.        Thought Content: Thought content normal.           Assessment & Plan:

## 2024-02-01 NOTE — Patient Instructions (Signed)
 Follow up in 6 weeks to recheck anxiety/mood INCREASE your Cymbalta  to 60mg  dialy- 2 of what you have at home, 1 of the new prescription Do NOT worry about what others say Call with any questions or concerns Stay Safe!  Stay Healthy! Hang in there!!!

## 2024-02-01 NOTE — Assessment & Plan Note (Signed)
 Deteriorated.  Pt was not able to tolerate the Buspar  as this increased her seizure like activity from 1-3x/day to 10x/day.  Sxs returned to baseline after stopping medication.  Rather than add something new, will increase Cymbalta  from 30mg  to 60mg  daily and continue to monitor closely.  Pt expressed understanding and is in agreement w/ plan.

## 2024-02-02 DIAGNOSIS — H524 Presbyopia: Secondary | ICD-10-CM | POA: Diagnosis not present

## 2024-02-04 ENCOUNTER — Other Ambulatory Visit: Payer: Self-pay | Admitting: Family Medicine

## 2024-02-04 DIAGNOSIS — H5213 Myopia, bilateral: Secondary | ICD-10-CM | POA: Diagnosis not present

## 2024-02-08 NOTE — Assessment & Plan Note (Signed)
 Deteriorated.  Neuro encouraged her to adjust anxiety meds as it is felt her seizure activity is driven by anxiety.  She has been under considerable stress but feels that she is handling things ok.  Stress will trigger migraine which will trigger seizure like activity.  On cymbalta  30mg  daily.  Will add Buspar .  Pt expressed understanding and is in agreement w/ plan.

## 2024-02-25 DIAGNOSIS — H5203 Hypermetropia, bilateral: Secondary | ICD-10-CM | POA: Diagnosis not present

## 2024-03-03 ENCOUNTER — Encounter: Payer: Self-pay | Admitting: Family Medicine

## 2024-03-03 NOTE — Telephone Encounter (Signed)
 Patient sent the following MyChart message:  Could we change the way my prescription for cymbalta  is written. The prescription is written to take 60mg  at night which keeps me awake. If I take 30 in the am and 30 in the pm it works better for me   Please advise, thank you.

## 2024-03-18 ENCOUNTER — Ambulatory Visit: Admitting: Family Medicine

## 2024-03-22 ENCOUNTER — Ambulatory Visit: Admitting: Family Medicine

## 2024-03-22 ENCOUNTER — Encounter: Payer: Self-pay | Admitting: Family Medicine

## 2024-03-22 VITALS — BP 102/68 | HR 84 | Temp 98.6°F | Ht 62.0 in | Wt 146.1 lb

## 2024-03-22 DIAGNOSIS — F419 Anxiety disorder, unspecified: Secondary | ICD-10-CM

## 2024-03-22 DIAGNOSIS — F32A Depression, unspecified: Secondary | ICD-10-CM | POA: Diagnosis not present

## 2024-03-22 MED ORDER — DULOXETINE HCL 30 MG PO CPEP: 30.0000 mg | ORAL_CAPSULE | Freq: Two times a day (BID) | ORAL | 3 refills | Status: AC

## 2024-03-22 NOTE — Progress Notes (Signed)
   Subjective:    Patient ID: Martha Moss, female    DOB: [birth year], 58 y.o.   MRN: [id number]  HPI Anxiety/Depression- at last visit we increased Cymbalta  to 60mg  daily.  Reports she is taking 30mg  in the morning and 30mg  at night.  Pt had a 10 day span w/o seizure activity.  Pt reports feeling good on medication.   Review of Systems For ROS see HPI     Objective:   Physical Exam Vitals reviewed.  Constitutional:      General: She is not in acute distress.    Appearance: Normal appearance. She is not ill-appearing.  HENT:     Head: Normocephalic and atraumatic.  Eyes:     Extraocular Movements: Extraocular movements intact.     Conjunctiva/sclera: Conjunctivae normal.  Skin:    General: Skin is warm and dry.  Neurological:     General: No focal deficit present.     Mental Status: She is alert and oriented to person, place, and time.  Psychiatric:        Mood and Affect: Mood normal.        Behavior: Behavior normal.        Thought Content: Thought content normal.           Assessment & Plan:

## 2024-03-22 NOTE — Patient Instructions (Signed)
 Follow up as needed or as scheduled Continue the Cymbalta  30mg  twice daily Continue to call Neuro regarding the Aimovig Call with any questions or concerns Stay Safe!  Stay Healthy! Hang in there!!!

## 2024-03-22 NOTE — Assessment & Plan Note (Signed)
 Improved w/ increased dose of Cymbalta .  She finds that taking 30mg  BID is more beneficial than 60mg  at one time.  She actually had a 10 day stretch w/o seizure activity (until she ran out of Aimovig).  No changes at this time.

## 2024-04-14 DIAGNOSIS — R569 Unspecified convulsions: Secondary | ICD-10-CM | POA: Diagnosis not present

## 2024-04-14 DIAGNOSIS — G43019 Migraine without aura, intractable, without status migrainosus: Secondary | ICD-10-CM | POA: Diagnosis not present

## 2024-04-19 ENCOUNTER — Other Ambulatory Visit: Payer: Self-pay | Admitting: Family Medicine

## 2024-04-19 NOTE — Telephone Encounter (Signed)
 Requested Prescriptions   Pending Prescriptions Disp Refills   BELSOMRA  10 MG TABS [Pharmacy Med Name: Belsomra  10 mg tablet] 30 tablet 3    Sig: TAKE 1 TABLET BY MOUTH AT BEDTIME     Date of patient request: 04/19/24 Last office visit: 03/22/2024 Upcoming visit: 07/04/2024 Date of last refill: 12/08/23 Last refill amount: 30 tablets with 3 refills

## 2024-04-27 ENCOUNTER — Inpatient Hospital Stay: Attending: Oncology

## 2024-04-27 DIAGNOSIS — Z8673 Personal history of transient ischemic attack (TIA), and cerebral infarction without residual deficits: Secondary | ICD-10-CM | POA: Diagnosis not present

## 2024-04-27 DIAGNOSIS — G459 Transient cerebral ischemic attack, unspecified: Secondary | ICD-10-CM

## 2024-04-28 ENCOUNTER — Inpatient Hospital Stay

## 2024-04-29 LAB — CARDIOLIPIN ANTIBODIES, IGG, IGM, IGA
Anticardiolipin IgA: <9 U/mL (ref 0–11)
Anticardiolipin IgG: <9 GPL U/mL (ref 0–14)
Anticardiolipin IgM: <9 [MPL'U]/mL (ref 0–12)

## 2024-05-09 ENCOUNTER — Telehealth: Admitting: Oncology

## 2024-05-12 ENCOUNTER — Telehealth: Attending: Oncology | Admitting: Oncology

## 2024-05-12 DIAGNOSIS — R569 Unspecified convulsions: Secondary | ICD-10-CM | POA: Diagnosis not present

## 2024-05-12 NOTE — Progress Notes (Signed)
 [facility]  [phone]) [phone] [phone]) [phone]  ID: Martha Moss OB: 1966/04/20  MR#: [id number]  RDW#:[id number]  Patient Care Team: Mahlon Comer BRAVO, MD as PCP - General (Family Medicine)  I connected with Martha Moss Level on 05/12/24 at  3:30 PM EDT by video enabled telemedicine visit and verified that I am speaking with the correct person using two identifiers.   I discussed the limitations, risks, security and privacy concerns of performing an evaluation and management service by telemedicine and the availability of in-person appointments. I also discussed with the patient that there may be a patient responsible charge related to this service. The patient expressed understanding and agreed to proceed.   Other persons participating in the visit and their role in the encounter: Patient, MD.  Patient's location: Home. Provider's location: Clinic.  CHIEF COMPLAINT: History of TIA/seizures, low positive anticardiolipin IgM.  INTERVAL HISTORY: Patient agreed to video-assisted telemedicine visit for further evaluation and discussion of her laboratory results.  She continues to have seizures approximately every 10 to 14 days, but otherwise feels well.  She is actively being monitored and treatment by neurology.  She has no other neurologic complaints.  She denies any recent fevers or illnesses.  She has a good appetite and denies weight loss.  She has no chest pain, shortness of breath, cough, or hemoptysis.  She denies any nausea, vomiting, constipation, or diarrhea.  She has no urinary complaints.  Patient offers no further specific complaints today.  REVIEW OF SYSTEMS:   Review of Systems  Constitutional: Negative.  Negative for fever, malaise/fatigue and weight loss.  Respiratory: Negative.  Negative for cough, hemoptysis and shortness of breath.   Cardiovascular: Negative.  Negative for chest pain and leg swelling.  Gastrointestinal: Negative.   Negative for abdominal pain.  Genitourinary: Negative.  Negative for dysuria.  Musculoskeletal: Negative.  Negative for back pain.  Skin: Negative.  Negative for rash.  Neurological:  Positive for seizures. Negative for dizziness, focal weakness, weakness and headaches.  Psychiatric/Behavioral: Negative.  The patient is not nervous/anxious.     As per HPI. Otherwise, a complete review of systems is negative.  PAST MEDICAL HISTORY: Past Medical History:  Diagnosis Date   Depression    Fibromyalgia    GERD (gastroesophageal reflux disease)    Migraine    PONV (postoperative nausea and vomiting)    Seizures (HCC) 2023   Now having at least 1 or more a day   TIA (transient ischemic attack)     PAST SURGICAL HISTORY: Past Surgical History:  Procedure Laterality Date   ABDOMINAL HYSTERECTOMY  1997/98   AUGMENTATION MAMMAPLASTY Bilateral    Implants   BIOPSY  07/06/2020   Procedure: BIOPSY;  Surgeon: San Sandor GAILS, DO;  Location: [facility];  Service: Gastroenterology;;   CHOLECYSTECTOMY     COSMETIC SURGERY  2015/16   Breast   ESOPHAGOGASTRODUODENOSCOPY Left 07/06/2020   Procedure: ESOPHAGOGASTRODUODENOSCOPY (EGD);  Surgeon: San Sandor GAILS, DO;  Location: [facility];  Service: Gastroenterology;  Laterality: Left;   ETHMOIDECTOMY Bilateral 09/09/2016   Procedure: ETHMOIDECTOMY;  Surgeon: Daniel Moccasin, MD;  Location: [facility];  Service: ENT;  Laterality: Bilateral;   FRONTAL SINUS EXPLORATION Bilateral 09/09/2016   Procedure: FRONTAL RECESS SINUS EXPLORATION;  Surgeon: Daniel Moccasin, MD;  Location: [facility];  Service: ENT;  Laterality: Bilateral;   MALONEY DILATION  07/06/2020   Procedure: AGAPITO DILATION;  Surgeon: San Sandor GAILS, DO;  Location: [facility];  Service: Gastroenterology;;  MAXILLARY ANTROSTOMY Bilateral 09/09/2016   Procedure: MAXILLARY ANTROSTOMY WITH TISSUE REMOVAL;  Surgeon: Daniel Moccasin, MD;  Location: [facility];  Service: ENT;  Laterality: Bilateral;   OVARIAN CYST SURGERY     SINUS ENDO W/FUSION Bilateral 09/09/2016   Procedure: ENDOSCOPIC SINUS SURGERY WITH NAVIGATION;  Surgeon: Daniel Moccasin, MD;  Location: [facility];  Service: ENT;  Laterality: Bilateral;   TOTAL VAGINAL HYSTERECTOMY     TUBAL LIGATION  1991   TURBINATE REDUCTION Bilateral 09/09/2016   Procedure: BILATERAL TURBINATE REDUCTION;  Surgeon: Daniel Moccasin, MD;  Location: [facility];  Service: ENT;  Laterality: Bilateral;    FAMILY HISTORY: Family History  Problem Relation Age of Onset   Kidney Stones Daughter    Asthma Daughter    Migraines Mother    Arthritis Mother    Hypertension Mother    Miscarriages / [place] Mother    Varicose Veins Mother    Heart attack Father 46   Early death Father    Heart disease Father    Diabetes Maternal Grandmother    Stroke Sister    Heart disease Sister    Varicose Veins Sister    Migraines Brother    Stroke Brother    Heart disease Brother    Migraines Other        many family members on maternal side    ADD / ADHD Daughter    Anxiety disorder Daughter    Depression Daughter    Learning disabilities Daughter    Heart disease Brother    Miscarriages / Stillbirths Sister    Stroke Sister    Varicose Veins Sister    Stroke Brother    Colon cancer Neg Hx    Pancreatic cancer Neg Hx    Esophageal cancer Neg Hx     ADVANCED DIRECTIVES (Y/N):  N  HEALTH MAINTENANCE: Social History   Tobacco Use   Smoking status: Never   Smokeless tobacco: Never  Vaping Use   Vaping status: Never Used  Substance Use Topics   Alcohol use: Yes    Comment: rarely   Drug use: No     Colonoscopy:  PAP:  Bone density:  Lipid panel:  Allergies  Allergen Reactions   Bee Venom Anaphylaxis   Azithromycin     REACTION: hives   Diphenhydramine Hcl     REACTION: hives   Erythromycin     REACTION: hives   Morphine And Codeine Nausea And Vomiting    Penicillins     REACTION: rash, fever   Percocet [Oxycodone -Acetaminophen ] Other (See Comments)    Hallucinations    Sulfonamide Derivatives     REACTION: rash, fever   Latex Swelling and Rash    Face and tongue swelling   Tetracyclines & Related Rash    fever    Current Outpatient Medications  Medication Sig Dispense Refill   AIMOVIG 140 MG/ML SOAJ Inject 1 mL as directed. Every 4 weeks     BELSOMRA  10 MG TABS TAKE 1 TABLET BY MOUTH AT BEDTIME 30 tablet 3   DULoxetine  (CYMBALTA ) 30 MG capsule Take 1 capsule (30 mg total) by mouth 2 (two) times daily. 60 capsule 3   EPINEPHrine  0.3 mg/0.3 mL IJ SOAJ injection Inject 0.3 mg into the muscle as needed for anaphylaxis. 1 each 1   fexofenadine (ALLEGRA) 180 MG tablet Take 180 mg by mouth as directed. Monday Wednesday and Friday     levocetirizine (XYZAL ) 5 MG tablet Take 5 mg  by mouth as directed. Tuesday Thursday Saturday and Sunday     magnesium  30 MG tablet Take 30 mg by mouth every other day.     nitrofurantoin , macrocrystal-monohydrate, (MACROBID ) 100 MG capsule Take 1 capsule (100 mg total) by mouth at bedtime. 30 capsule 3   pantoprazole  (PROTONIX ) 40 MG tablet Take 1 tablet (40 mg total) by mouth 2 (two) times daily before a meal. 60 tablet 11   rosuvastatin  (CRESTOR ) 40 MG tablet TAKE 1 TABLET BY MOUTH DAILY 90 tablet 1   topiramate  (TOPAMAX ) 100 MG tablet Take 150 mg by mouth 2 (two) times daily. 150MG  AT NIGHT     VITAMIN D  PO Take 1,000 Units by mouth daily.     vitamin E  1000 UNIT capsule Take 1,000 Units by mouth daily.     No current facility-administered medications for this visit.    OBJECTIVE: There were no vitals filed for this visit.    There is no height or weight on file to calculate BMI.    ECOG FS:0 - Asymptomatic  General: Well-developed, well-nourished, no acute distress. HEENT: Normocephalic. Neuro: Alert, answering all questions appropriately. Cranial nerves grossly intact. Psych: Normal affect.  LAB  RESULTS:  Lab Results  Component Value Date   NA 142 12/14/2023   K 3.8 12/14/2023   CL 109 12/14/2023   CO2 23 12/14/2023   GLUCOSE 82 12/14/2023   BUN 25 (H) 12/14/2023   CREATININE 0.83 12/14/2023   CALCIUM  9.5 12/14/2023   PROT 7.4 12/14/2023   ALBUMIN 4.5 12/14/2023   AST 27 12/14/2023   ALT 23 12/14/2023   ALKPHOS 72 12/14/2023   BILITOT 0.4 12/14/2023   GFRNONAA >60 10/22/2023   GFRAA >60 06/24/2018    Lab Results  Component Value Date   WBC 5.5 12/14/2023   NEUTROABS 2.7 12/14/2023   HGB 12.8 12/14/2023   HCT 39.0 12/14/2023   MCV 94.2 12/14/2023   PLT 279.0 12/14/2023     STUDIES: No results found.  ASSESSMENT: History of TIA, low positive anticardiolipin IgM  PLAN:    Low positive anticardiolipin IgM antibody: Resolved.  Patient's cardiolipin antibodies are now all within normal limits.  She does not meet criteria for antiphospholipid syndrome.  The remainder of her hypercoagulable workup was reported as negative.  No further intervention is needed.  No further follow-up is necessary in the cancer center. TIA/seizure: Unclear etiology.  No obvious evidence of clot.  Patient currently on aspirin .  Follow-up with neurology as scheduled.  I provided 20 minutes of face-to-face video visit time during this encounter which included chart review, counseling, and coordination of care as documented above.   Patient expressed understanding and was in agreement with this plan. She also understands that She can call clinic at any time with any questions, concerns, or complaints.     Evalene JINNY Reusing, MD   05/12/2024 3:12 PM

## 2024-05-12 NOTE — Progress Notes (Signed)
 Patient told me that she can't go more than 12- 15 days without having a seizure. Asked patient if she had a Neurologist that she sees regularly and she said yes and she has been his patient for about 4 or more years. She is doing ok besides the seizures.

## 2024-05-17 ENCOUNTER — Encounter: Payer: Self-pay | Admitting: Family Medicine

## 2024-05-17 DIAGNOSIS — R569 Unspecified convulsions: Secondary | ICD-10-CM

## 2024-05-17 NOTE — Telephone Encounter (Signed)
 Patient would like a second opinion from neurology feel the care is lacking where they are currently please advise

## 2024-06-01 DIAGNOSIS — R569 Unspecified convulsions: Secondary | ICD-10-CM | POA: Diagnosis not present

## 2024-06-20 ENCOUNTER — Ambulatory Visit (HOSPITAL_BASED_OUTPATIENT_CLINIC_OR_DEPARTMENT_OTHER)
Admission: RE | Admit: 2024-06-20 | Discharge: 2024-06-20 | Disposition: A | Discharge status: Home / Self Care | Source: Ambulatory Visit | Attending: Student in an Organized Health Care Education/Training Program | Admitting: Student in an Organized Health Care Education/Training Program

## 2024-06-20 ENCOUNTER — Ambulatory Visit: Payer: Self-pay

## 2024-06-20 ENCOUNTER — Encounter: Payer: Self-pay | Admitting: Student in an Organized Health Care Education/Training Program

## 2024-06-20 ENCOUNTER — Ambulatory Visit: Admitting: Student in an Organized Health Care Education/Training Program

## 2024-06-20 VITALS — BP 112/62 | HR 94 | Temp 97.8°F

## 2024-06-20 DIAGNOSIS — R053 Chronic cough: Secondary | ICD-10-CM | POA: Insufficient documentation

## 2024-06-20 DIAGNOSIS — R059 Cough, unspecified: Secondary | ICD-10-CM | POA: Diagnosis not present

## 2024-06-20 MED ORDER — GUAIFENESIN-CODEINE 100-10 MG/5ML PO SOLN: 5.0000 mL | Freq: Every evening | ORAL | 0 refills | Status: AC | PRN

## 2024-06-20 MED ORDER — FLUTICASONE PROPIONATE HFA 110 MCG/ACT IN AERO: 1.0000 | INHALATION_SPRAY | Freq: Every day | RESPIRATORY_TRACT | 12 refills | Status: AC

## 2024-06-20 NOTE — Telephone Encounter (Signed)
 Yes, ok to see them both at 4. Thank you.

## 2024-06-20 NOTE — Telephone Encounter (Signed)
 OKAY- to schedule at same time with Dr.Vincent

## 2024-06-20 NOTE — Telephone Encounter (Signed)
 FYI Only or Action Required?: Action required by provider: request for appointment. Pt would like to be seen today when husband is also at clinic for appt. Refused appt for tomorrow.   Patient was last seen in primary care on 03/22/2024 by Mahlon Comer BRAVO, MD.  Called Nurse Triage reporting Cough.  Symptoms began several weeks ago.  Interventions attempted: Nothing.  Symptoms are: gradually worsening.  Triage Disposition: See Physician Within 24 Hours  Patient/caregiver understands and will follow disposition?: Yes, will follow disposition  Copied from CRM #[id number]. Topic: Clinical - Red Word Triage >> Jun 20, 2024  8:29 AM Mesmerise C wrote: [facility] that prompted transfer to Nurse Triage: Patient is experiencing plegm, coughing, trouble sleeping been ongoing for a week been getting worse >> Jun 20, 2024  8:53 AM Mesmerise C wrote: Waited for over 10 minutes a nurse was advised a nurse would call back husband would like an appt around same time as him at 4:00 today with Dr. Jerrell can be reached at [phone] Reason for Disposition  SEVERE coughing spells (e.g., whooping sound after coughing, vomiting after coughing)  Answer Assessment - Initial Assessment Questions 1. ONSET: When did the cough begin?      About 6 weeks 2. SEVERITY: How bad is the cough today?      moderate 3. SPUTUM: Describe the color of your sputum (e.g., none, dry cough; clear, white, yellow, green)     Yellow green 4. HEMOPTYSIS: Are you coughing up any blood? If Yes, ask: How much? (e.g., flecks, streaks, tablespoons, etc.)     denies 5. DIFFICULTY BREATHING: Are you having difficulty breathing? If Yes, ask: How bad is it? (e.g., mild, moderate, severe)      States difficulty breathing is when moving and during coughing episodes 6. FEVER: Do you have a fever? If Yes, ask: What is your temperature, how was it measured, and when did it start?     Denies, states she has not taken  temp 7. CARDIAC HISTORY: Do you have any history of heart disease? (e.g., heart attack, congestive heart failure)      denies 8. LUNG HISTORY: Do you have any history of lung disease?  (e.g., pulmonary embolus, asthma, emphysema)     denies 9. PE RISK FACTORS: Do you have a history of blood clots? (or: recent major surgery, recent prolonged travel, bedridden)     denies 10. OTHER SYMPTOMS: Do you have any other symptoms? (e.g., runny nose, wheezing, chest pain)       Runny nose with being outside, more stuffy otherwise 12. TRAVEL: Have you traveled out of the country in the last month? (e.g., travel history, exposures)       Denies  Pt was offered appt tomorrow, pt refused at this time, states my husband and I have the same thing, can you just see us  at the same time, you can charge us . Pt was advised that providers need to have dedicated appt times for pts.  Protocols used: Cough - Acute Productive-A-AH

## 2024-06-20 NOTE — Assessment & Plan Note (Signed)
 The cough is likely due to reactive airway post-viral infection, possibly bronchitis. Differential includes postinfectious reactive airway disease, esophageal reflux disease, or allergic rhinitis with postnasal drip. No signs of pneumonia are present. A chest x-ray was ordered to rule out structural disease.  We talked about supportive care.  We talked about the natural course of postinfectious coughs.  I recommended against the use of systemic steroids or antibiotics at this point.  Codeine-guaifenesin was prescribed for cough suppression, especially at night. A Flovent  inhaler was prescribed for daily use, with instructions to rinse her mouth after use to prevent thrush. Inhaler use instructions were provided, along with recommended YouTube videos.  Continue with pantoprazole  40 mg twice daily.

## 2024-06-20 NOTE — Progress Notes (Signed)
 Acute Office Visit  Patient ID: Martha Moss, female    DOB: Aug 18, 1965, 58 y.o.   MRN: [id number]  PCP: Mahlon Comer BRAVO, MD  Chief Complaint  Patient presents with   Cough     Patient is experiencing plegm, coughing, trouble sleeping been ongoing for a week been getting worse    Subjective:     HPI  Discussed the use of AI scribe software for clinical note transcription with the patient, who gave verbal consent to proceed.  History of Present Illness Martha Moss is a 58 year old female who presents with a persistent cough for six weeks.  She has been experiencing a persistent cough for the past six weeks, initially attributing it to allergies. The cough has progressively worsened, becoming a 'really bad hacking cough' over the last two weeks. She experiences chest pain and produces a significant amount of yellow-green sputum. She also feels extremely tired, which she attributes to the effort of coughing and disrupted sleep due to nocturnal coughing.  No fever, but she reports chills. There is slight shortness of breath and a decrease in her usual food and fluid intake due to fatigue. She has been sleeping more than usual because of her tiredness.  Her past medical history includes seizures, for which she is currently taking Topamax  150 mg twice daily. She also takes Belsomra  30 mg twice daily. She has severe reflux and is on Protonix  40 mg twice daily, although she feels it is not effective. She underwent an endoscopy three years ago where her throat was stretched, but she does not recall the findings.  She takes Allegra four days a week and Xyzal  three days a week for allergies, as she felt Allegra alone was not effective. No history of asthma, COPD, or tobacco use. 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      Objective:    BP 112/62   Pulse 94   Temp 97.8 F (36.6 C) (Temporal)   SpO2 100%    Physical Exam  Gen: Well appearing woman Ears: Bilateral tympanic membranes are normal with no effusion Mouth: Crowded posterior oropharynx, no erythema Neck: No tender adenopathy Heart: Regular, no murmur Lungs: Unlabored, coarse cough heard with deep insulation, on inspiration she has mild left sided crackles, no wheezing      Assessment & Plan:   Problem List Items Addressed This Visit       Unprioritized   Chronic cough - Primary   The cough is likely due to reactive airway post-viral infection, possibly bronchitis. Differential includes postinfectious reactive airway disease, esophageal reflux disease, or allergic rhinitis with postnasal drip. No signs of pneumonia are present. A chest x-ray was ordered to rule out structural disease.  We talked about supportive care.  We talked about the natural course of postinfectious coughs.  I recommended against the use of systemic steroids or antibiotics at this point.  Codeine-guaifenesin was prescribed for cough suppression, especially at night. A Flovent  inhaler was prescribed for daily use, with instructions to rinse her mouth after use to prevent thrush. Inhaler use instructions were provided, along with recommended YouTube videos.  Continue with pantoprazole  40 mg twice daily.      Relevant Medications   guaiFENesin-codeine 100-10 MG/5ML syrup   fluticasone  (FLOVENT  HFA) 110 MCG/ACT inhaler   Other Relevant Orders   DG Chest 2 View    Meds ordered this encounter  Medications  guaiFENesin-codeine 100-10 MG/5ML syrup    Sig: Take 5 mLs by mouth at bedtime as needed.    Dispense:  120 mL    Refill:  0   fluticasone  (FLOVENT  HFA) 110 MCG/ACT inhaler    Sig: Inhale 1 puff into the lungs daily.    Dispense:  1 each    Refill:  12    Return if symptoms worsen or fail to improve.  Cleatus Debby Specking, MD [facility]

## 2024-06-20 NOTE — Telephone Encounter (Signed)
 Pt has been scheduled.

## 2024-06-20 NOTE — Patient Instructions (Signed)
  VISIT SUMMARY: During your visit, we discussed your persistent cough, which has been ongoing for six weeks and has worsened recently. We also reviewed your history of seizures, severe reflux, and seasonal allergies. A treatment plan was created to address each of these issues.  YOUR PLAN: -PROLONGED COUGH WITH SPUTUM PRODUCTION AND FATIGUE: Your prolonged cough is likely due to a reactive airway following a viral infection, possibly bronchitis. We have prescribed a chest x-ray to rule out pneumonia. For cough suppression, especially at night, you will take codeine-guaifenesin. Additionally, you will use a Flovent  inhaler daily to help with your breathing. Please remember to rinse your mouth after using the inhaler to prevent thrush. We also provided instructions and recommended YouTube videos on how to use the inhaler properly.  -SEIZURE DISORDER: Your seizure disorder is currently managed with Topamax  and Belsomra . You are scheduled for a post-Christmas hospitalization for further evaluation and possible medication adjustment. Please continue your current medication regimen until then.  -GASTROESOPHAGEAL REFLUX DISEASE (GERD): GERD is a condition where stomach acid frequently flows back into the tube connecting your mouth and stomach, causing irritation. You are currently taking Protonix , although it has been less effective. Continue taking Protonix  40 mg twice daily. If your symptoms persist, we may consider a repeat endoscopy.  -SEASONAL ALLERGIC RHINITIS: Seasonal allergic rhinitis is an allergic reaction to pollen that causes sneezing, congestion, and a runny nose. You are managing this with Allegra and Xyzal . Continue with your current regimen of taking Allegra and Xyzal  on alternate days.  INSTRUCTIONS: Please follow up with the chest x-ray as ordered. Continue your current medications for seizures and GERD. If your cough persists or worsens, or if you experience any new symptoms, please  contact our office. You are scheduled for a post-Christmas hospitalization for seizure evaluation and medication adjustment.

## 2024-06-21 ENCOUNTER — Ambulatory Visit: Payer: Self-pay

## 2024-06-21 NOTE — Telephone Encounter (Signed)
 Patient looking for xray results from yesterday. Advised patient provider has not viewed them yet and she will get a call with the results when they do.   Copied from CRM [id number]. Topic: Clinical - Lab/Test Results >> Jun 21, 2024  4:45 PM Alfonso HERO wrote: Reason for CRM: patient calling to see if her xray results were back.

## 2024-06-22 ENCOUNTER — Telehealth: Payer: Self-pay

## 2024-06-22 NOTE — Telephone Encounter (Signed)
 Thank you.  I spoke with the patient's husband.  On my view the x-ray is reassuring with no structural changes or opacities.  Will send them the radiology report once I receive it.

## 2024-06-22 NOTE — Telephone Encounter (Signed)
 Copied from CRM [id number]. Topic: Clinical - Medication Prior Auth >> Jun 22, 2024  9:07 AM Deaijah H wrote: Reason for CRM: Patient stated insurance is requiring an PA for medication BELSOMRA  10 MG TABS.

## 2024-06-22 NOTE — Telephone Encounter (Signed)
 The x-ray has been completed but not resulted yet. Did let patient know we will reach out to her once these results have been resulted.

## 2024-06-22 NOTE — Telephone Encounter (Signed)
 Patient is needing a Prior Auth for medication below   Belsomra  10mg  tabs

## 2024-06-23 ENCOUNTER — Telehealth: Payer: Self-pay

## 2024-06-23 ENCOUNTER — Ambulatory Visit: Payer: Self-pay | Admitting: Student in an Organized Health Care Education/Training Program

## 2024-06-23 ENCOUNTER — Other Ambulatory Visit (HOSPITAL_COMMUNITY): Payer: Self-pay

## 2024-06-23 NOTE — Telephone Encounter (Signed)
 Pharmacy Patient Advocate Encounter   Received notification from Pt Calls Messages that prior authorization for Belsomra  10MG  tablets is required/requested.   Insurance verification completed.   The patient is insured through [facility] MEDICAID.   Per test claim: The current 30 day co-pay is, $4.00.  No PA needed at this time. This test claim was processed through [facility]- copay amounts may vary at other pharmacies due to pharmacy/plan contracts, or as the patient moves through the different stages of their insurance plan.    -qty limit- insurance covers 15 tabs per 30 days.

## 2024-06-24 ENCOUNTER — Other Ambulatory Visit (HOSPITAL_COMMUNITY): Payer: Self-pay

## 2024-06-28 ENCOUNTER — Encounter: Payer: Self-pay | Admitting: Family Medicine

## 2024-06-28 NOTE — Telephone Encounter (Signed)
 Pt needs PA for   BELSOMRA  10 MG TABS

## 2024-06-29 ENCOUNTER — Telehealth: Payer: Self-pay

## 2024-06-29 ENCOUNTER — Other Ambulatory Visit (HOSPITAL_COMMUNITY): Payer: Self-pay

## 2024-06-29 NOTE — Telephone Encounter (Signed)
 Pharmacy Patient Advocate Encounter   Received notification from Physician's Office that prior authorization for Belsomra  10mg  tabs is required/requested.   Insurance verification completed.   The patient is insured through [employer] ([employer]CIAL).   Per test claim: PA required; PA submitted to above mentioned insurance via Fax Key/confirmation #/EOC --- Status is pending

## 2024-06-30 NOTE — Telephone Encounter (Signed)
 Patient has been informed.

## 2024-06-30 NOTE — Telephone Encounter (Signed)
 Pharmacy Patient Advocate Encounter  Received notification from [facility] [employer]) that Prior Authorization for BELSOMRA  10MG  TABS has been APPROVED from 06/29/24 to 12/27/2024

## 2024-07-04 ENCOUNTER — Encounter: Payer: Self-pay | Admitting: Family Medicine

## 2024-07-04 ENCOUNTER — Ambulatory Visit: Admitting: Family Medicine

## 2024-07-04 VITALS — BP 127/80 | HR 80 | Temp 98.0°F | Ht 62.0 in | Wt 144.5 lb

## 2024-07-04 DIAGNOSIS — B9689 Other specified bacterial agents as the cause of diseases classified elsewhere: Secondary | ICD-10-CM

## 2024-07-04 DIAGNOSIS — E785 Hyperlipidemia, unspecified: Secondary | ICD-10-CM

## 2024-07-04 DIAGNOSIS — J329 Chronic sinusitis, unspecified: Secondary | ICD-10-CM

## 2024-07-04 DIAGNOSIS — Z0000 Encounter for general adult medical examination without abnormal findings: Secondary | ICD-10-CM | POA: Diagnosis not present

## 2024-07-04 LAB — CBC WITH DIFFERENTIAL/PLATELET
Basophils Absolute: 0.1 K/uL (ref 0.0–0.1)
Basophils Relative: 0.7 % (ref 0.0–3.0)
Eosinophils Absolute: 0.2 K/uL (ref 0.0–0.7)
Eosinophils Relative: 1.7 % (ref 0.0–5.0)
HCT: 37.1 % (ref 36.0–46.0)
Hemoglobin: 12.3 g/dL (ref 12.0–15.0)
Lymphocytes Relative: 17.7 % (ref 12.0–46.0)
Lymphs Abs: 2 K/uL (ref 0.7–4.0)
MCHC: 33.2 g/dL (ref 30.0–36.0)
MCV: 94 fl (ref 78.0–100.0)
Monocytes Absolute: 0.8 K/uL (ref 0.1–1.0)
Monocytes Relative: 7.3 % (ref 3.0–12.0)
Neutro Abs: 8.1 K/uL — ABNORMAL HIGH (ref 1.4–7.7)
Neutrophils Relative %: 72.6 % (ref 43.0–77.0)
Platelets: 263 K/uL (ref 150.0–400.0)
RBC: 3.95 Mil/uL (ref 3.87–5.11)
RDW: 13.7 % (ref 11.5–15.5)
WBC: 11.1 K/uL — ABNORMAL HIGH (ref 4.0–10.5)

## 2024-07-04 LAB — HEPATIC FUNCTION PANEL
ALT: 13 U/L (ref 0–35)
AST: 16 U/L (ref 0–37)
Albumin: 4.4 g/dL (ref 3.5–5.2)
Alkaline Phosphatase: 70 U/L (ref 39–117)
Bilirubin, Direct: 0.1 mg/dL (ref 0.0–0.3)
Total Bilirubin: 0.3 mg/dL (ref 0.2–1.2)
Total Protein: 6.7 g/dL (ref 6.0–8.3)

## 2024-07-04 LAB — BASIC METABOLIC PANEL WITH GFR
BUN: 22 mg/dL (ref 6–23)
CO2: 24 meq/L (ref 19–32)
Calcium: 9.4 mg/dL (ref 8.4–10.5)
Chloride: 112 meq/L (ref 96–112)
Creatinine, Ser: 0.78 mg/dL (ref 0.40–1.20)
GFR: 83.6 mL/min (ref >60.00)
Glucose, Bld: 97 mg/dL (ref 70–99)
Potassium: 3.9 meq/L (ref 3.5–5.1)
Sodium: 143 meq/L (ref 135–145)

## 2024-07-04 LAB — LIPID PANEL
Cholesterol: 149 mg/dL (ref 0–200)
HDL: 60.2 mg/dL (ref >39.00)
LDL Cholesterol: 69 mg/dL (ref 0–99)
NonHDL: 88.47
Total CHOL/HDL Ratio: 2
Triglycerides: 97 mg/dL (ref 0.0–149.0)
VLDL: 19.4 mg/dL (ref 0.0–40.0)

## 2024-07-04 LAB — TSH: TSH: 0.95 u[IU]/mL (ref 0.35–5.50)

## 2024-07-04 MED ORDER — LEVOFLOXACIN 500 MG PO TABS: 500.0000 mg | ORAL_TABLET | Freq: Every day | ORAL | 0 refills | Status: AC

## 2024-07-04 NOTE — Progress Notes (Unsigned)
 Subjective:    Patient ID: Martha Moss, female    DOB: [birth year], 58 y.o.   MRN: [id number]  HPI CPE- UTD on mammo, colonoscopy.  Due for Tdap, flu  Health Maintenance  Topic Date Due   Hepatitis B Vaccines 19-59 Average Risk (1 of 3 - 19+ 3-dose series) Never done   Zoster Vaccines- Shingrix (1 of 2) Never done   Pneumococcal Vaccine: 50+ Years (1 of 1 - PCV) Never done   DTaP/Tdap/Td (2 - Tdap) 08/14/2018   Influenza Vaccine  03/11/2024   COVID-19 Vaccine (4 - 2025-26 season) 04/11/2024   Mammogram  06/30/2025   Colonoscopy  10/31/2031   Hepatitis C Screening  Completed   HIV Screening  Completed   HPV VACCINES  Aged Out   Meningococcal B Vaccine  Aged Out    Patient Care Team    Relationship Specialty Notifications Start End  Mahlon Comer BRAVO, MD PCP - General Family Medicine  02/03/12       Review of Systems Patient reports no vision/hearing changes, adenopathy,fever, weight change, swallowing issues, chest pain, palpitations, edema, hemoptysis, dyspnea (rest/exertional/paroxysmal nocturnal), gastrointestinal bleeding (melena, rectal bleeding), abdominal pain, bowel changes, GU symptoms (dysuria, hematuria, incontinence), Gyn symptoms (abnormal  bleeding, pain),  syncope, focal weakness, memory loss, numbness & tingling, skin/hair changes, abnormal bruising or bleeding, anxiety, or depression.   + URI- sxs started ~2 months ago w/ cough.  Saw Dr Jerrell on 11/10 and CXR was negative.  At that time, she was given a Flovent  inhaler and cough syrup.  Now w/ facial pain, ear pain.  + tooth pain.  + GERD- follows w/ GI, on Protonix  BID + irregular heart beat- sees Cardiology    Objective:   Physical Exam General Appearance:    Alert, cooperative, no distress, appears stated age  Head:    Normocephalic, without obvious abnormality, atraumatic  Eyes:    PERRL, conjunctiva/corneas clear, EOM's intact both eyes  Ears:    Normal TM's and external ear canals, both ears   Nose:   Nares normal, septum midline, mucosa normal, no drainage, + TTP over frontal and maxillary sinuses  Throat:   Lips, mucosa, and tongue normal; teeth and gums normal  Neck:   Supple, symmetrical, trachea midline, no adenopathy;    Thyroid : no enlargement/tenderness/nodules  Back:     Symmetric, no curvature, ROM normal, no CVA tenderness  Lungs:     Clear to auscultation bilaterally, respirations unlabored  Chest Wall:    No tenderness or deformity   Heart:    Regular rate and rhythm, S1 and S2 normal, no murmur, rub   or gallop  Breast Exam:    Deferred to GYN  Abdomen:     Soft, non-tender, bowel sounds active all four quadrants,    no masses, no organomegaly  Genitalia:    Deferred to GYN  Rectal:    Extremities:   Extremities normal, atraumatic, no cyanosis or edema  Pulses:   2+ and symmetric all extremities  Skin:   Skin color, texture, turgor normal, no rashes or lesions  Lymph nodes:   Cervical, supraclavicular, and axillary nodes normal  Neurologic:   CNII-XII intact, normal strength, sensation and reflexes    throughout          Assessment & Plan:  Sinusitis- new.  Pt's duration of sxs and PE are consistent w/ bacterial infxn.  Given her multiple medication allergies will start Levaquin .  Reviewed supportive care and red flags that should prompt  return.  Pt expressed understanding and is in agreement w/ plan.

## 2024-07-04 NOTE — Patient Instructions (Signed)
 Follow up in 6 months to recheck cholesterol We'll notify you of your lab results and make any changes if needed START the Levaquin  daily w/ food Drink LOTS of fluids REST when you can! Call with any questions or concerns Hang in there! HAPPY HOLIDAYS!!!

## 2024-07-05 ENCOUNTER — Ambulatory Visit: Payer: Self-pay | Admitting: Family Medicine

## 2024-07-05 NOTE — Assessment & Plan Note (Signed)
 Pt's PE WNL w/ exception of sinusitis.  UTD on mammo, colonocopy.  Declines flu and Tdap.  Check labs.  Anticipatory guidance provided.

## 2024-07-05 NOTE — Progress Notes (Signed)
 Pt has reviewed labs via MyChart

## 2024-07-18 ENCOUNTER — Encounter: Payer: Self-pay | Admitting: Orthopedic Surgery

## 2024-07-18 ENCOUNTER — Other Ambulatory Visit: Payer: Self-pay

## 2024-07-18 ENCOUNTER — Ambulatory Visit: Admitting: Physician Assistant

## 2024-07-18 DIAGNOSIS — M25511 Pain in right shoulder: Secondary | ICD-10-CM | POA: Diagnosis not present

## 2024-07-18 DIAGNOSIS — G8929 Other chronic pain: Secondary | ICD-10-CM

## 2024-07-18 MED ORDER — LIDOCAINE HCL 1 % IJ SOLN
5.0000 mL | INTRAMUSCULAR | Status: AC | PRN
  Administered 2024-07-18: 5 mL

## 2024-07-18 MED ORDER — METHYLPREDNISOLONE ACETATE 40 MG/ML IJ SUSP
40.0000 mg | INTRAMUSCULAR | Status: AC | PRN
  Administered 2024-07-18: 40 mg via INTRA_ARTICULAR

## 2024-07-18 NOTE — Progress Notes (Signed)
 Office Visit Note   Patient: Martha Moss           Date of Birth: 06/24/1966           MRN: [id number] Visit Date: 07/18/2024              Requested by: Mahlon Comer BRAVO, MD [phone] [address],  [place] [address] PCP: Mahlon Comer BRAVO, MD  Chief Complaint  Patient presents with   Right Shoulder - Pain      HPI: Discussed the use of AI scribe software for clinical note transcription with the patient, who gave verbal consent to proceed.  History of Present Illness Martha Moss is a 58 year old female who presents with right shoulder pain and impingement syndrome.  She experiences pain in her right shoulder that radiates down her arm and into her neck and back. The pain is located in the joint and is accompanied by a 'popping' sensation during movement. Activities such as painting, scrubbing, and cleaning exacerbate the pain.  She has previously received cortisone injections for her shoulder pain, with the first injection providing relief for several years and the second lasting about a year and a half. Each injection has resulted in instant relief.  She has not engaged in any formal therapy for her shoulder.  Her past medical history includes shoulder replacement surgery three weeks ago, which she describes as challenging but ultimately beneficial. She also has a history of heart surgery and refers to herself as a 'bionic man' due to multiple surgeries.     Assessment & Plan: Visit Diagnoses:  1. Chronic right shoulder pain     Plan: Assessment and Plan Assessment & Plan Right shoulder impingement syndrome Chronic impingement with radiating pain, popping sounds, and tenderness. Positive Neer and Hawkins tests. Previous cortisone injections provided significant relief. - Administered cortisone injection to the right shoulder. - Instructed on internal and external rotation strengthening exercises and scapular stabilization. - Advised on exercises  with elbow at ninety degrees, pulling in, out, and back against resistance. - Recommended use of gym equipment or exercise straps for resistance training. - Discussed potential for formal therapy if symptoms persist or flare up.      Follow-Up Instructions: Return if symptoms worsen or fail to improve.   Ortho Exam  Patient is alert, oriented, no adenopathy, well-dressed, normal affect, normal respiratory effort. Physical Exam MUSCULOSKELETAL: Tenderness to palpation over the biceps tendon. Pain with internal and external rotation. Pain with Neer and Hawkins impingement test. Pain to palpation posteriorly over the subacromial space.      Imaging: XR Shoulder Right Result Date: 07/18/2024 2 view radiographs of the right shoulder shows slight superior migration of the humeral head within the glenoid.  There is a good subacromial space.  No AC arthropathy.  No images are attached to the encounter.  Labs: Lab Results  Component Value Date   HGBA1C 5.4 06/09/2023   HGBA1C 5.1 07/03/2020   HGBA1C 5.2 03/04/2018   ESRSEDRATE 9 07/03/2020   ESRSEDRATE 4 03/04/2018   ESRSEDRATE 8 10/19/2009   LABURIC 3.4 10/26/2009   REPTSTATUS 11/06/2022 FINAL 11/04/2022   CULT 50,000 COLONIES/mL ESCHERICHIA COLI (A) 11/04/2022   LABORGA ESCHERICHIA COLI (A) 11/04/2022     Lab Results  Component Value Date   ALBUMIN 4.4 07/04/2024   ALBUMIN 4.5 12/14/2023   ALBUMIN 4.2 10/22/2023    Lab Results  Component Value Date   MG 2.3 10/22/2023   MG 2.0  07/06/2020   MG 2.1 07/05/2020   Lab Results  Component Value Date   VD25OH 50 09/25/2016    No results found for: PREALBUMIN    Latest Ref Rng & Units 07/04/2024   10:20 AM 12/14/2023   11:24 AM 10/22/2023   11:32 PM  CBC EXTENDED  WBC 4.0 - 10.5 K/uL 11.1  5.5  8.1   RBC 3.87 - 5.11 Mil/uL 3.95  4.14  4.31   Hemoglobin 12.0 - 15.0 g/dL 87.6  87.1  86.6   HCT 36.0 - 46.0 % 37.1  39.0  40.9   Platelets 150.0 - 400.0 K/uL 263.0  279.0   255   NEUT# 1.4 - 7.7 K/uL 8.1  2.7  4.0   Lymph# 0.7 - 4.0 K/uL 2.0  2.3  3.3      There is no height or weight on file to calculate BMI.  Orders:  Orders Placed This Encounter  Procedures   XR Shoulder Right   No orders of the defined types were placed in this encounter.    Procedures: Large Joint Inj: R subacromial bursa on 07/18/2024 3:48 PM Indications: diagnostic evaluation and pain Details: 22 G 1.5 in needle, posterior approach  Arthrogram: No  Medications: 5 mL lidocaine  1 %; 40 mg methylPREDNISolone  acetate 40 MG/ML Outcome: tolerated well, no immediate complications Procedure, treatment alternatives, risks and benefits explained, specific risks discussed. Consent was given by the patient. Immediately prior to procedure a time out was called to verify the correct patient, procedure, equipment, support staff and site/side marked as required. Patient was prepped and draped in the usual sterile fashion.      Clinical Data: No additional findings.  ROS:  All other systems negative, except as noted in the HPI. Review of Systems  Objective: Vital Signs: There were no vitals taken for this visit.  Specialty Comments:  No specialty comments available.  PMFS History: Patient Active Problem List   Diagnosis Date Noted   Chronic cough 06/20/2024   Atrial fibrillation (HCC) 08/03/2023   Precordial pain 08/03/2023   Hx-TIA (transient ischemic attack) 08/03/2023   Family history of early CAD 08/03/2023   Hyperlipidemia 08/03/2023   TIA (transient ischemic attack) 06/09/2023   History of migraine 06/09/2023   Overweight (BMI 25.0-29.9) 06/02/2023   Interstitial cystitis 11/24/2022   DJD (degenerative joint disease), lumbosacral 09/15/2022   Leg pain 09/14/2022   PAD (peripheral artery disease) 05/04/2022   Esophageal dysphagia    Right upper quadrant abdominal pain    Seizure-like activity (HCC) 07/03/2020   Psychogenic nonepileptic seizure 07/03/2020    Transient hypotension 07/03/2020   Neuropathy of both feet 12/22/2017   Physical exam 09/25/2016   Chronic idiopathic constipation 10/13/2014   Insomnia 06/22/2013   Pain in right wrist 06/22/2013   Plantar fasciitis of left foot 03/14/2013   Anxiety and depression 10/28/2010   Migraine without aura 10/28/2010   GERD 10/28/2010   CHEST PAIN, ATYPICAL, HX OF 03/27/2010   ALLERGIC RHINITIS, SEASONAL 12/07/2009   Recurrent UTI 10/26/2009   Fibromyalgia 10/26/2009   POLYARTHRITIS 10/19/2009   Past Medical History:  Diagnosis Date   Depression    Fibromyalgia    GERD (gastroesophageal reflux disease)    Migraine    PONV (postoperative nausea and vomiting)    Seizures (HCC) 2023   Now having at least 1 or more a day   TIA (transient ischemic attack)     Family History  Problem Relation Age of Onset   Kidney Stones Daughter  Asthma Daughter    Migraines Mother    Arthritis Mother    Hypertension Mother    Miscarriages / Stillbirths Mother    Varicose Veins Mother    Heart attack Father 14   Early death Father    Heart disease Father    Diabetes Maternal Grandmother    Stroke Sister    Heart disease Sister    Varicose Veins Sister    Migraines Brother    Stroke Brother    Heart disease Brother    Migraines Other        many family members on maternal side    ADD / ADHD Daughter    Anxiety disorder Daughter    Depression Daughter    Learning disabilities Daughter    Heart disease Brother    Miscarriages / Stillbirths Sister    Stroke Sister    Varicose Veins Sister    Stroke Brother    Colon cancer Neg Hx    Pancreatic cancer Neg Hx    Esophageal cancer Neg Hx     Past Surgical History:  Procedure Laterality Date   ABDOMINAL HYSTERECTOMY  1997/98   AUGMENTATION MAMMAPLASTY Bilateral    Implants   BIOPSY  07/06/2020   Procedure: BIOPSY;  Surgeon: San Sandor GAILS, DO;  Location: [facility];  Service: Gastroenterology;;   CHOLECYSTECTOMY     COSMETIC  SURGERY  2015/16   Breast   ESOPHAGOGASTRODUODENOSCOPY Left 07/06/2020   Procedure: ESOPHAGOGASTRODUODENOSCOPY (EGD);  Surgeon: San Sandor GAILS, DO;  Location: [facility];  Service: Gastroenterology;  Laterality: Left;   ETHMOIDECTOMY Bilateral 09/09/2016   Procedure: ETHMOIDECTOMY;  Surgeon: Daniel Moccasin, MD;  Location: [facility];  Service: ENT;  Laterality: Bilateral;   FRONTAL SINUS EXPLORATION Bilateral 09/09/2016   Procedure: FRONTAL RECESS SINUS EXPLORATION;  Surgeon: Daniel Moccasin, MD;  Location: [facility];  Service: ENT;  Laterality: Bilateral;   MALONEY DILATION  07/06/2020   Procedure: AGAPITO DILATION;  Surgeon: San Sandor GAILS, DO;  Location: [facility];  Service: Gastroenterology;;   MAXILLARY ANTROSTOMY Bilateral 09/09/2016   Procedure: MAXILLARY ANTROSTOMY WITH TISSUE REMOVAL;  Surgeon: Daniel Moccasin, MD;  Location: [facility];  Service: ENT;  Laterality: Bilateral;   OVARIAN CYST SURGERY     SINUS ENDO W/FUSION Bilateral 09/09/2016   Procedure: ENDOSCOPIC SINUS SURGERY WITH NAVIGATION;  Surgeon: Daniel Moccasin, MD;  Location: [facility];  Service: ENT;  Laterality: Bilateral;   TOTAL VAGINAL HYSTERECTOMY     TUBAL LIGATION  1991   TURBINATE REDUCTION Bilateral 09/09/2016   Procedure: BILATERAL TURBINATE REDUCTION;  Surgeon: Daniel Moccasin, MD;  Location: [facility];  Service: ENT;  Laterality: Bilateral;   Social History   Occupational History   Occupation: pre school teacher  Tobacco Use   Smoking status: Never   Smokeless tobacco: Never  Vaping Use   Vaping status: Never Used  Substance and Sexual Activity   Alcohol use: Yes    Comment: rarely   Drug use: No   Sexual activity: Yes    Birth control/protection: Surgical

## 2024-08-22 ENCOUNTER — Ambulatory Visit: Payer: Self-pay

## 2024-08-22 NOTE — Telephone Encounter (Signed)
 Patient notes facial pain starting a month ago. FYI for provider appt tomorrow 08/23/2024

## 2024-08-22 NOTE — Telephone Encounter (Signed)
 FYI Only or Action Required?: FYI only for provider: appointment scheduled on 1/13.  Patient was last seen in primary care on 07/04/2024 by Mahlon Comer BRAVO, MD.  Called Nurse Triage reporting Facial Pain.  Symptoms began about a month ago.  Symptoms are: gradually worsening.  Triage Disposition: See Physician Within 24 Hours  Patient/caregiver understands and will follow disposition?: Yes, will follow disposition  Copied from CRM [id number]. Topic: Clinical - Red Word Triage >> Aug 22, 2024  4:09 PM Rea ORN wrote: Red Word that prompted transfer to Nurse Triage: green mucus, possible sinus infection Reason for Disposition  Earache  Answer Assessment - Initial Assessment Questions 1. LOCATION: Where does it hurt?      Teeth, face 2. ONSET: When did the sinus pain start?  (e.g., hours, days)      Overall since nov, states that she has been told twice that she has a sinus infection 3. SEVERITY: How bad is the pain?   (Scale 0-10; or none, mild, moderate or severe)     Depends states that 4 currently, but is using heating pad, can be 10 4. RECURRENT SYMPTOM: Have you ever had sinus problems before? If Yes, ask: When was the last time? and What happened that time?      Has been diagnosed twice with sinus infection, has not gone away 5. NASAL CONGESTION: Is the nose blocked? If Yes, ask: Can you open it or must you breathe through your mouth?     yes 6. NASAL DISCHARGE: Do you have discharge from your nose? If so ask, What color?     green 7. FEVER: Do you have a fever? If Yes, ask: What is it, how was it measured, and when did it start?      denies 8. OTHER SYMPTOMS: Do you have any other symptoms? (e.g., sore throat, cough, earache, difficulty breathing)     Denies difficulty breathing, + earache,  Pt requests VV as she cannot drive d/t sz at this time and partner cannot drive her tomorrow.  Protocols used: Sinus Pain or Congestion-A-AH

## 2024-08-23 ENCOUNTER — Telehealth (INDEPENDENT_AMBULATORY_CARE_PROVIDER_SITE_OTHER): Admitting: Family Medicine

## 2024-08-23 ENCOUNTER — Encounter: Payer: Self-pay | Admitting: Family Medicine

## 2024-08-23 DIAGNOSIS — B9689 Other specified bacterial agents as the cause of diseases classified elsewhere: Secondary | ICD-10-CM

## 2024-08-23 DIAGNOSIS — J329 Chronic sinusitis, unspecified: Secondary | ICD-10-CM | POA: Diagnosis not present

## 2024-08-23 MED ORDER — LEVOFLOXACIN 500 MG PO TABS: 500.0000 mg | ORAL_TABLET | Freq: Every day | ORAL | 0 refills | Status: AC

## 2024-08-23 NOTE — Progress Notes (Signed)
 "   Virtual Visit via Video   I connected with patient on 08/23/2024 at 10:20 AM EST by a video enabled telemedicine application and verified that I am speaking with the correct person using two identifiers.  Location patient: Home Location provider: [facility], Office Persons participating in the virtual visit: Patient, Provider, CMA (Meighan D)  I discussed the limitations of evaluation and management by telemedicine and the availability of in person appointments. The patient expressed understanding and agreed to proceed.  Subjective:   HPI:   Facial pain- pt reports sxs have been ongoing.  Took round of Levaquin  in late November and things were improving but never resolved.  Have since back slid and worsened.  Having sweats and chills.  + HA.  + facial pain/pressure.  ROS:   See pertinent positives and negatives per HPI.  Patient Active Problem List   Diagnosis Date Noted   Chronic cough 06/20/2024   Atrial fibrillation (HCC) 08/03/2023   Precordial pain 08/03/2023   Hx-TIA (transient ischemic attack) 08/03/2023   Family history of early CAD 08/03/2023   Hyperlipidemia 08/03/2023   TIA (transient ischemic attack) 06/09/2023   History of migraine 06/09/2023   Overweight (BMI 25.0-29.9) 06/02/2023   Interstitial cystitis 11/24/2022   DJD (degenerative joint disease), lumbosacral 09/15/2022   Leg pain 09/14/2022   PAD (peripheral artery disease) 05/04/2022   Esophageal dysphagia    Right upper quadrant abdominal pain    Seizure-like activity (HCC) 07/03/2020   Psychogenic nonepileptic seizure 07/03/2020   Transient hypotension 07/03/2020   Neuropathy of both feet 12/22/2017   Physical exam 09/25/2016   Chronic idiopathic constipation 10/13/2014   Insomnia 06/22/2013   Pain in right wrist 06/22/2013   Plantar fasciitis of left foot 03/14/2013   Anxiety and depression 10/28/2010   Migraine without aura 10/28/2010   GERD 10/28/2010   CHEST PAIN, ATYPICAL, HX OF  03/27/2010   ALLERGIC RHINITIS, SEASONAL 12/07/2009   Recurrent UTI 10/26/2009   Fibromyalgia 10/26/2009   POLYARTHRITIS 10/19/2009    Social History   Tobacco Use   Smoking status: Never   Smokeless tobacco: Never  Substance Use Topics   Alcohol use: Yes    Comment: rarely   Current Medications[1]  Allergies[2]  Objective:   There were no vitals taken for this visit. AAOx3, NAD NCAT, EOMI No obvious CN deficits Coloring WNL Pt is able to speak clearly, coherently without shortness of breath or increased work of breathing.  Thought process is linear.  Mood is appropriate.   Assessment and Plan:   Bacterial sinusitis- ongoing issue for pt w/o relief.  Had some improvement on Levaquin  but not resolution.  Will repeat Levaquin  but extend duration to 10 days.  Pt expressed understanding and is in agreement w/ plan.    Comer Greet, MD 08/23/2024       [1]  Current Outpatient Medications:    AIMOVIG 140 MG/ML SOAJ, Inject 1 mL as directed. Every 4 weeks, Disp: , Rfl:    BELSOMRA  10 MG TABS, TAKE 1 TABLET BY MOUTH AT BEDTIME, Disp: 30 tablet, Rfl: 3   DULoxetine  (CYMBALTA ) 30 MG capsule, Take 1 capsule (30 mg total) by mouth 2 (two) times daily., Disp: 60 capsule, Rfl: 3   EPINEPHrine  0.3 mg/0.3 mL IJ SOAJ injection, Inject 0.3 mg into the muscle as needed for anaphylaxis., Disp: 1 each, Rfl: 1   fexofenadine (ALLEGRA) 180 MG tablet, Take 180 mg by mouth as directed. Monday Wednesday and Friday, Disp: , Rfl:  fluticasone  (FLOVENT  HFA) 110 MCG/ACT inhaler, Inhale 1 puff into the lungs daily., Disp: 1 each, Rfl: 12   guaiFENesin -codeine  100-10 MG/5ML syrup, Take 5 mLs by mouth at bedtime as needed., Disp: 120 mL, Rfl: 0   levocetirizine (XYZAL ) 5 MG tablet, Take 5 mg by mouth as directed. Tuesday Thursday Saturday and Sunday, Disp: , Rfl:    magnesium  30 MG tablet, Take 30 mg by mouth every other day., Disp: , Rfl:    nitrofurantoin , macrocrystal-monohydrate,  (MACROBID ) 100 MG capsule, Take 1 capsule (100 mg total) by mouth at bedtime., Disp: 30 capsule, Rfl: 3   pantoprazole  (PROTONIX ) 40 MG tablet, Take 1 tablet (40 mg total) by mouth 2 (two) times daily before a meal., Disp: 60 tablet, Rfl: 11   rosuvastatin  (CRESTOR ) 40 MG tablet, TAKE 1 TABLET BY MOUTH DAILY, Disp: 90 tablet, Rfl: 1   VITAMIN D  PO, Take 1,000 Units by mouth daily., Disp: , Rfl:    vitamin E  1000 UNIT capsule, Take 1,000 Units by mouth daily., Disp: , Rfl:  [2]  Allergies Allergen Reactions   Bee Venom Anaphylaxis   Azithromycin     REACTION: hives   Diphenhydramine Hcl     REACTION: hives   Erythromycin     REACTION: hives   Morphine And Codeine  Nausea And Vomiting   Penicillins     REACTION: rash, fever   Percocet [Oxycodone -Acetaminophen ] Other (See Comments)    Hallucinations    Sulfonamide Derivatives     REACTION: rash, fever   Latex Swelling and Rash    Face and tongue swelling   Tetracyclines & Related Rash    fever   "

## 2024-08-25 ENCOUNTER — Other Ambulatory Visit: Payer: Self-pay | Admitting: Family Medicine

## 2024-08-25 NOTE — Telephone Encounter (Signed)
 Requested Prescriptions   Pending Prescriptions Disp Refills   BELSOMRA  10 MG TABS [Pharmacy Med Name: Belsomra  10 mg tablet] 30 tablet 3    Sig: TAKE 1 TABLET BY MOUTH AT BEDTIME     Date of patient request: 08/25/2024 Last office visit: 07/04/2024 Upcoming visit: Visit date not found Date of last refill: 04/19/2024 Last refill amount: 30

## 2024-09-14 ENCOUNTER — Ambulatory Visit: Admitting: Family Medicine

## 2024-09-14 ENCOUNTER — Encounter: Payer: Self-pay | Admitting: Family Medicine

## 2024-09-14 VITALS — BP 100/72 | HR 86 | Temp 98.9°F | Ht 62.0 in | Wt 142.6 lb

## 2024-09-14 DIAGNOSIS — R051 Acute cough: Secondary | ICD-10-CM

## 2024-09-14 LAB — POC COVID19 BINAXNOW: SARS Coronavirus 2 Ag: NEGATIVE

## 2024-09-14 MED ORDER — ALBUTEROL SULFATE HFA 108 (90 BASE) MCG/ACT IN AERS: 2.0000 | INHALATION_SPRAY | Freq: Four times a day (QID) | RESPIRATORY_TRACT | 0 refills | Status: AC | PRN

## 2024-09-14 MED ORDER — LEVOFLOXACIN 500 MG PO TABS: 500.0000 mg | ORAL_TABLET | Freq: Every day | ORAL | 0 refills | Status: AC

## 2024-09-14 MED ORDER — BENZONATATE 200 MG PO CAPS: 200.0000 mg | ORAL_CAPSULE | Freq: Three times a day (TID) | ORAL | 0 refills | Status: AC | PRN

## 2024-09-14 MED ORDER — PROMETHAZINE-DM 6.25-15 MG/5ML PO SYRP: 5.0000 mL | ORAL_SOLUTION | Freq: Four times a day (QID) | ORAL | 0 refills | Status: AC | PRN

## 2024-09-14 NOTE — Progress Notes (Signed)
" ° °  Subjective:    Patient ID: Martha Moss, female    DOB: 1965/11/03, 59 y.o.   MRN: [id number]  HPI Cough- sxs started 'a little over a week ago'.  No fever.  + hacking cough- intermittently productive.  Some SOB w/ cough.  No wheezing.  Denies sinus pain/pressure.  + sick contacts.  Pt reports cough is worsening w/ time rather than improving.     Review of Systems For ROS see HPI     Objective:   Physical Exam Vitals reviewed.  Constitutional:      General: She is not in acute distress.    Appearance: Normal appearance. She is not ill-appearing.  HENT:     Head: Normocephalic and atraumatic.     Right Ear: Tympanic membrane and ear canal normal.     Left Ear: Tympanic membrane and ear canal normal.     Nose:     Comments: No TTP over frontal or maxillary sinuses    Mouth/Throat:     Mouth: Mucous membranes are moist.     Pharynx: Oropharynx is clear. No oropharyngeal exudate or posterior oropharyngeal erythema.  Cardiovascular:     Rate and Rhythm: Normal rate and regular rhythm.  Pulmonary:     Effort: Pulmonary effort is normal. No respiratory distress.     Breath sounds: Wheezing (expiratory wheezes at bases) present. No rhonchi.     Comments: + hacking cough Musculoskeletal:     Cervical back: Neck supple.  Lymphadenopathy:     Cervical: No cervical adenopathy.  Skin:    General: Skin is warm and dry.  Neurological:     General: No focal deficit present.     Mental Status: She is alert and oriented to person, place, and time.  Psychiatric:        Mood and Affect: Mood normal.        Behavior: Behavior normal.        Thought Content: Thought content normal.           Assessment & Plan:  Cough- new.  Pt recently exposed to husband who had PNA and also hospitalized for seizure workup.  This makes risk of bacterial infxn more likely.  Due to multiple allergies, will start Levaquin .  Cough meds and albuterol  prn.  Reviewed supportive care and red flags  that should prompt return.  Pt expressed understanding and is in agreement w/ plan.   "

## 2024-09-14 NOTE — Patient Instructions (Signed)
 Follow up as needed or as scheduled START the Levaquin  daily x7 days- take w/ food USE the cough pills/syrup as needed ADD the inhaler for cough/wheezing REST! Call with any questions or concerns Hang in there!!
